# Patient Record
Sex: Male | Born: 1945 | Race: White | Hispanic: No | Marital: Married | Smoking: Never smoker
Health system: Southern US, Community
[De-identification: ages and names within clinical notes are randomized; demographics above are authoritative.]

## PROBLEM LIST (undated history)

## (undated) DIAGNOSIS — R042 Hemoptysis: Secondary | ICD-10-CM

## (undated) DIAGNOSIS — N200 Calculus of kidney: Secondary | ICD-10-CM

## (undated) DIAGNOSIS — M1990 Unspecified osteoarthritis, unspecified site: Secondary | ICD-10-CM

## (undated) DIAGNOSIS — E7800 Pure hypercholesterolemia, unspecified: Secondary | ICD-10-CM

## (undated) DIAGNOSIS — I499 Cardiac arrhythmia, unspecified: Secondary | ICD-10-CM

## (undated) DIAGNOSIS — K219 Gastro-esophageal reflux disease without esophagitis: Secondary | ICD-10-CM

## (undated) DIAGNOSIS — J45909 Unspecified asthma, uncomplicated: Secondary | ICD-10-CM

## (undated) DIAGNOSIS — I10 Essential (primary) hypertension: Secondary | ICD-10-CM

## (undated) DIAGNOSIS — E119 Type 2 diabetes mellitus without complications: Secondary | ICD-10-CM

## (undated) DIAGNOSIS — A419 Sepsis, unspecified organism: Secondary | ICD-10-CM

## (undated) DIAGNOSIS — E0590 Thyrotoxicosis, unspecified without thyrotoxic crisis or storm: Secondary | ICD-10-CM

## (undated) DIAGNOSIS — I4891 Unspecified atrial fibrillation: Secondary | ICD-10-CM

## (undated) DIAGNOSIS — J189 Pneumonia, unspecified organism: Secondary | ICD-10-CM

## (undated) HISTORY — DX: Pure hypercholesterolemia, unspecified: E78.00

## (undated) HISTORY — DX: Type 2 diabetes mellitus without complications: E11.9

## (undated) HISTORY — DX: Hemoptysis: R04.2

## (undated) HISTORY — DX: Essential (primary) hypertension: I10

## (undated) HISTORY — DX: Unspecified osteoarthritis, unspecified site: M19.90

## (undated) HISTORY — DX: Sepsis, unspecified organism: A41.9

## (undated) HISTORY — PX: JOINT REPLACEMENT: SHX530

## (undated) HISTORY — DX: Gastro-esophageal reflux disease without esophagitis: K21.9

## (undated) HISTORY — DX: Pneumonia, unspecified organism: J18.9

## (undated) HISTORY — DX: Calculus of kidney: N20.0

## (undated) HISTORY — DX: Unspecified asthma, uncomplicated: J45.909

## (undated) HISTORY — DX: Thyrotoxicosis, unspecified without thyrotoxic crisis or storm: E05.90

## (undated) HISTORY — DX: Cardiac arrhythmia, unspecified: I49.9

---

## 1998-03-23 ENCOUNTER — Inpatient Hospital Stay (HOSPITAL_COMMUNITY): Admission: EM | Admit: 1998-03-23 | Discharge: 1998-03-25 | Payer: Self-pay | Admitting: Emergency Medicine

## 1998-03-23 ENCOUNTER — Encounter: Payer: Self-pay | Admitting: Neurosurgery

## 1999-07-11 ENCOUNTER — Encounter: Payer: Self-pay | Admitting: Neurosurgery

## 1999-07-11 ENCOUNTER — Encounter: Admission: RE | Admit: 1999-07-11 | Discharge: 1999-07-11 | Payer: Self-pay | Admitting: Neurosurgery

## 2005-04-15 ENCOUNTER — Other Ambulatory Visit: Payer: Self-pay

## 2005-04-23 ENCOUNTER — Inpatient Hospital Stay: Payer: Self-pay | Admitting: Specialist

## 2006-01-04 ENCOUNTER — Emergency Department: Payer: Self-pay | Admitting: Emergency Medicine

## 2006-01-05 ENCOUNTER — Ambulatory Visit: Payer: Self-pay | Admitting: Emergency Medicine

## 2007-10-20 ENCOUNTER — Emergency Department: Payer: Self-pay | Admitting: Emergency Medicine

## 2010-01-24 ENCOUNTER — Ambulatory Visit: Payer: Self-pay | Admitting: Ophthalmology

## 2010-01-30 ENCOUNTER — Ambulatory Visit: Payer: Self-pay | Admitting: Ophthalmology

## 2010-02-09 ENCOUNTER — Other Ambulatory Visit: Payer: Self-pay | Admitting: Internal Medicine

## 2010-02-10 ENCOUNTER — Inpatient Hospital Stay: Payer: Self-pay | Admitting: Internal Medicine

## 2010-03-02 ENCOUNTER — Ambulatory Visit: Payer: Self-pay | Admitting: Internal Medicine

## 2010-04-10 ENCOUNTER — Ambulatory Visit: Payer: Self-pay | Admitting: Internal Medicine

## 2010-04-12 ENCOUNTER — Institutional Professional Consult (permissible substitution): Payer: Self-pay | Admitting: Internal Medicine

## 2011-01-10 ENCOUNTER — Ambulatory Visit: Payer: Self-pay | Admitting: Pain Medicine

## 2011-01-30 ENCOUNTER — Ambulatory Visit: Payer: Self-pay | Admitting: Pain Medicine

## 2011-07-16 ENCOUNTER — Ambulatory Visit: Payer: Self-pay | Admitting: Pain Medicine

## 2011-07-30 ENCOUNTER — Ambulatory Visit: Payer: Self-pay | Admitting: Pain Medicine

## 2011-10-01 ENCOUNTER — Ambulatory Visit: Payer: Self-pay | Admitting: Pain Medicine

## 2011-10-07 ENCOUNTER — Ambulatory Visit: Payer: Self-pay | Admitting: Pain Medicine

## 2011-10-16 ENCOUNTER — Ambulatory Visit: Payer: Self-pay | Admitting: Pain Medicine

## 2011-10-22 ENCOUNTER — Ambulatory Visit: Payer: Self-pay | Admitting: Pain Medicine

## 2011-10-31 ENCOUNTER — Ambulatory Visit: Payer: Self-pay | Admitting: Pain Medicine

## 2011-11-05 ENCOUNTER — Ambulatory Visit: Payer: Self-pay | Admitting: Pain Medicine

## 2011-11-19 ENCOUNTER — Ambulatory Visit: Payer: Self-pay | Admitting: Pain Medicine

## 2011-12-10 ENCOUNTER — Ambulatory Visit: Payer: Self-pay | Admitting: Pain Medicine

## 2011-12-30 ENCOUNTER — Ambulatory Visit: Payer: Self-pay | Admitting: Pain Medicine

## 2012-03-30 ENCOUNTER — Ambulatory Visit: Payer: Self-pay | Admitting: Internal Medicine

## 2012-04-08 ENCOUNTER — Ambulatory Visit: Payer: Self-pay | Admitting: Pain Medicine

## 2012-05-25 ENCOUNTER — Ambulatory Visit: Payer: Self-pay | Admitting: Pain Medicine

## 2012-06-02 ENCOUNTER — Ambulatory Visit: Payer: Self-pay | Admitting: Pain Medicine

## 2012-06-24 ENCOUNTER — Ambulatory Visit: Payer: Self-pay | Admitting: Pain Medicine

## 2012-09-25 DIAGNOSIS — I480 Paroxysmal atrial fibrillation: Secondary | ICD-10-CM | POA: Insufficient documentation

## 2012-09-25 DIAGNOSIS — E1159 Type 2 diabetes mellitus with other circulatory complications: Secondary | ICD-10-CM | POA: Insufficient documentation

## 2012-09-25 DIAGNOSIS — E7800 Pure hypercholesterolemia, unspecified: Secondary | ICD-10-CM | POA: Insufficient documentation

## 2012-09-25 DIAGNOSIS — M51379 Other intervertebral disc degeneration, lumbosacral region without mention of lumbar back pain or lower extremity pain: Secondary | ICD-10-CM | POA: Insufficient documentation

## 2012-09-25 DIAGNOSIS — I10 Essential (primary) hypertension: Secondary | ICD-10-CM | POA: Insufficient documentation

## 2012-09-25 DIAGNOSIS — M5137 Other intervertebral disc degeneration, lumbosacral region: Secondary | ICD-10-CM | POA: Insufficient documentation

## 2012-09-30 DIAGNOSIS — J45909 Unspecified asthma, uncomplicated: Secondary | ICD-10-CM | POA: Insufficient documentation

## 2012-09-30 DIAGNOSIS — E0590 Thyrotoxicosis, unspecified without thyrotoxic crisis or storm: Secondary | ICD-10-CM | POA: Insufficient documentation

## 2012-12-30 ENCOUNTER — Ambulatory Visit: Payer: Self-pay | Admitting: Pain Medicine

## 2012-12-31 DIAGNOSIS — N1832 Chronic kidney disease, stage 3b: Secondary | ICD-10-CM | POA: Insufficient documentation

## 2013-01-06 ENCOUNTER — Ambulatory Visit: Payer: Self-pay | Admitting: Pain Medicine

## 2013-02-25 HISTORY — PX: EYE SURGERY: SHX253

## 2013-02-25 HISTORY — PX: KNEE SURGERY: SHX244

## 2013-06-17 ENCOUNTER — Ambulatory Visit: Payer: Self-pay | Admitting: Pain Medicine

## 2013-07-05 DIAGNOSIS — Z789 Other specified health status: Secondary | ICD-10-CM | POA: Insufficient documentation

## 2013-07-05 DIAGNOSIS — Z1389 Encounter for screening for other disorder: Secondary | ICD-10-CM | POA: Insufficient documentation

## 2013-07-07 ENCOUNTER — Ambulatory Visit: Payer: Self-pay | Admitting: Pain Medicine

## 2013-07-08 ENCOUNTER — Ambulatory Visit: Payer: Self-pay | Admitting: Pain Medicine

## 2013-07-22 ENCOUNTER — Ambulatory Visit: Payer: Self-pay | Admitting: Pain Medicine

## 2013-10-05 DIAGNOSIS — R7989 Other specified abnormal findings of blood chemistry: Secondary | ICD-10-CM | POA: Insufficient documentation

## 2013-10-05 DIAGNOSIS — E349 Endocrine disorder, unspecified: Secondary | ICD-10-CM | POA: Insufficient documentation

## 2014-01-06 ENCOUNTER — Ambulatory Visit: Payer: Self-pay | Admitting: Pain Medicine

## 2014-03-25 ENCOUNTER — Ambulatory Visit: Payer: Self-pay | Admitting: Pain Medicine

## 2014-04-21 DIAGNOSIS — I4891 Unspecified atrial fibrillation: Secondary | ICD-10-CM | POA: Insufficient documentation

## 2014-12-23 ENCOUNTER — Telehealth: Payer: Self-pay

## 2014-12-23 NOTE — Telephone Encounter (Signed)
Pt wants an epidural

## 2014-12-23 NOTE — Telephone Encounter (Signed)
Schedule patient for lumbar epidural per Dr Laban EmperorNaveira.

## 2015-01-03 ENCOUNTER — Ambulatory Visit: Payer: Medicare Other | Attending: Pain Medicine | Admitting: Pain Medicine

## 2015-01-03 ENCOUNTER — Encounter: Payer: Self-pay | Admitting: Pain Medicine

## 2015-01-03 VITALS — BP 105/57 | HR 77 | Temp 97.8°F | Resp 14 | Ht 73.0 in | Wt 220.0 lb

## 2015-01-03 DIAGNOSIS — M542 Cervicalgia: Secondary | ICD-10-CM

## 2015-01-03 DIAGNOSIS — M48061 Spinal stenosis, lumbar region without neurogenic claudication: Secondary | ICD-10-CM

## 2015-01-03 DIAGNOSIS — M25551 Pain in right hip: Secondary | ICD-10-CM

## 2015-01-03 DIAGNOSIS — K219 Gastro-esophageal reflux disease without esophagitis: Secondary | ICD-10-CM | POA: Diagnosis not present

## 2015-01-03 DIAGNOSIS — M549 Dorsalgia, unspecified: Secondary | ICD-10-CM | POA: Diagnosis present

## 2015-01-03 DIAGNOSIS — M47816 Spondylosis without myelopathy or radiculopathy, lumbar region: Secondary | ICD-10-CM | POA: Diagnosis not present

## 2015-01-03 DIAGNOSIS — M7918 Myalgia, other site: Secondary | ICD-10-CM | POA: Insufficient documentation

## 2015-01-03 DIAGNOSIS — M5412 Radiculopathy, cervical region: Secondary | ICD-10-CM

## 2015-01-03 DIAGNOSIS — I2510 Atherosclerotic heart disease of native coronary artery without angina pectoris: Secondary | ICD-10-CM | POA: Insufficient documentation

## 2015-01-03 DIAGNOSIS — M25511 Pain in right shoulder: Secondary | ICD-10-CM

## 2015-01-03 DIAGNOSIS — M5126 Other intervertebral disc displacement, lumbar region: Secondary | ICD-10-CM

## 2015-01-03 DIAGNOSIS — E7800 Pure hypercholesterolemia, unspecified: Secondary | ICD-10-CM | POA: Diagnosis not present

## 2015-01-03 DIAGNOSIS — E1121 Type 2 diabetes mellitus with diabetic nephropathy: Secondary | ICD-10-CM | POA: Diagnosis not present

## 2015-01-03 DIAGNOSIS — M5136 Other intervertebral disc degeneration, lumbar region: Secondary | ICD-10-CM

## 2015-01-03 DIAGNOSIS — G8929 Other chronic pain: Secondary | ICD-10-CM | POA: Insufficient documentation

## 2015-01-03 DIAGNOSIS — S22000S Wedge compression fracture of unspecified thoracic vertebra, sequela: Secondary | ICD-10-CM

## 2015-01-03 DIAGNOSIS — M4806 Spinal stenosis, lumbar region: Secondary | ICD-10-CM | POA: Diagnosis not present

## 2015-01-03 DIAGNOSIS — F1190 Opioid use, unspecified, uncomplicated: Secondary | ICD-10-CM | POA: Insufficient documentation

## 2015-01-03 DIAGNOSIS — R011 Cardiac murmur, unspecified: Secondary | ICD-10-CM | POA: Insufficient documentation

## 2015-01-03 DIAGNOSIS — Z7901 Long term (current) use of anticoagulants: Secondary | ICD-10-CM | POA: Insufficient documentation

## 2015-01-03 DIAGNOSIS — M79604 Pain in right leg: Secondary | ICD-10-CM

## 2015-01-03 DIAGNOSIS — M5416 Radiculopathy, lumbar region: Secondary | ICD-10-CM

## 2015-01-03 DIAGNOSIS — I129 Hypertensive chronic kidney disease with stage 1 through stage 4 chronic kidney disease, or unspecified chronic kidney disease: Secondary | ICD-10-CM | POA: Insufficient documentation

## 2015-01-03 DIAGNOSIS — E781 Pure hyperglyceridemia: Secondary | ICD-10-CM | POA: Insufficient documentation

## 2015-01-03 DIAGNOSIS — M4726 Other spondylosis with radiculopathy, lumbar region: Secondary | ICD-10-CM | POA: Diagnosis not present

## 2015-01-03 DIAGNOSIS — M4854XS Collapsed vertebra, not elsewhere classified, thoracic region, sequela of fracture: Secondary | ICD-10-CM | POA: Insufficient documentation

## 2015-01-03 DIAGNOSIS — I4891 Unspecified atrial fibrillation: Secondary | ICD-10-CM | POA: Insufficient documentation

## 2015-01-03 DIAGNOSIS — M25562 Pain in left knee: Secondary | ICD-10-CM

## 2015-01-03 DIAGNOSIS — M47812 Spondylosis without myelopathy or radiculopathy, cervical region: Secondary | ICD-10-CM

## 2015-01-03 DIAGNOSIS — I209 Angina pectoris, unspecified: Secondary | ICD-10-CM | POA: Insufficient documentation

## 2015-01-03 DIAGNOSIS — Z79891 Long term (current) use of opiate analgesic: Secondary | ICD-10-CM | POA: Insufficient documentation

## 2015-01-03 MED ORDER — LIDOCAINE HCL (PF) 1 % IJ SOLN
INTRAMUSCULAR | Status: AC
  Administered 2015-01-03: 3 mL
  Filled 2015-01-03: qty 5

## 2015-01-03 MED ORDER — IOHEXOL 180 MG/ML  SOLN: 10.0000 mL | Freq: Once | INTRAMUSCULAR | Status: DC | PRN

## 2015-01-03 MED ORDER — TRIAMCINOLONE ACETONIDE 40 MG/ML IJ SUSP: 40.0000 mg | Freq: Once | INTRAMUSCULAR | Status: DC

## 2015-01-03 MED ORDER — IOHEXOL 180 MG/ML  SOLN
INTRAMUSCULAR | Status: AC
  Filled 2015-01-03: qty 20

## 2015-01-03 MED ORDER — TRIAMCINOLONE ACETONIDE 40 MG/ML IJ SUSP
INTRAMUSCULAR | Status: AC
  Administered 2015-01-03: 40 mg
  Filled 2015-01-03: qty 1

## 2015-01-03 MED ORDER — SODIUM CHLORIDE 0.9 % IJ SOLN: 2.0000 mL | Freq: Once | INTRAMUSCULAR | Status: DC

## 2015-01-03 MED ORDER — SODIUM CHLORIDE 0.9 % IJ SOLN
INTRAMUSCULAR | Status: AC
  Administered 2015-01-03: 2 mL
  Filled 2015-01-03: qty 10

## 2015-01-03 MED ORDER — ROPIVACAINE HCL 2 MG/ML IJ SOLN: 2.0000 mL | Freq: Once | INTRAMUSCULAR | Status: DC

## 2015-01-03 MED ORDER — ROPIVACAINE HCL 2 MG/ML IJ SOLN
INTRAMUSCULAR | Status: AC
  Administered 2015-01-03: 2 mL
  Filled 2015-01-03: qty 10

## 2015-01-03 MED ORDER — LIDOCAINE HCL (PF) 1 % IJ SOLN: 10.0000 mL | Freq: Once | INTRAMUSCULAR | Status: DC

## 2015-01-03 NOTE — Progress Notes (Signed)
Patient's Name: Alan Stafford MRN: [id number] DOB: 1945-07-10 DOS: 01/03/2015  Primary Reason(s) for Visit: Interventional Pain Management Treatment. CC: Back Pain   Pre-Procedure Assessment: Alan Stafford is a 69 y.o. year old, male patient, seen today for interventional treatment. He has Chronic pain; Lumbar spondylosis; Airway hyperreactivity; Atrial fibrillation ([place]); Chronic kidney disease (CKD), stage III (moderate); CAD in native artery; Degeneration of intervertebral disc of lumbar region; Diabetic nephropathy ([place]); Essential (primary) hypertension; Acid reflux; Cardiac murmur; Hypercholesterolemia; Hypertriglyceridemia; Hyperthyroidism; Hypotestosteronism; Arthritis of knee, degenerative; Angina pectoris ([place]); Paroxysmal atrial fibrillation ([place]); Encounter for screening for other disorder; Drug intolerance; Type 2 diabetes mellitus ([place]); Long term current use of anticoagulant therapy; Bulging lumbar disc (T12-L1, L1-2, L2-3, and L4-5); 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 His primarily concern today is the Back Pain Verification of the correct person, correct site (including marking of site), and correct procedure were performed and confirmed by the patient. Today's Vitals   01/03/15 0949 01/03/15 0954 01/03/15 1004 01/03/15 1014  BP: 166/94 158/63 99/59 105/57  Pulse: 106 76 77 77  Temp:      TempSrc:      Resp: 14 14 14 14   Height:      Weight:      SpO2: 94% 93% 98%  98%  PainSc:  0-No pain    Calculated BMI: Body mass index is 29.03 kg/(m^2). Allergies: He has No Known Allergies.. 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 Laterality: Right-Sided Paramedial  Indications: Spondylosis, Lumbosacral Region  Consent: Secured. Under the influence of no sedatives a written informed consent was obtained, after having provided information on the risks and possible complications. To fulfill our ethical and legal obligations, as recommended by the American Medical Association's Code of Ethics, we have provided information to the patient about our clinical impression; the nature and purpose of the treatment or procedure; the risks, benefits, and possible complications of the intervention; alternatives; the risk(s) and benefit(s) of the alternative treatment(s) or procedure(s); and the risk(s) and benefit(s) of doing nothing. The patient was provided information about the risks and possible complications associated with the procedure. In the case of spinal procedures these may include, but are not limited to, failure to achieve desired goals, infection, bleeding, organ or nerve damage, allergic reactions, paralysis, and death. In addition, the patient was informed that Medicine is not an exact science; therefore, there is also the possibility of unforeseen risks and possible complications that may result in a catastrophic outcome. The patient indicated having understood very clearly. We have given the patient no guarantees and we have made no promises. Enough time was given to the patient to ask questions, all of which were answered to the patient's satisfaction.  Pre-Procedure Preparation: Safety Precautions: Allergies reviewed. Appropriate site, procedure, and patient were confirmed by following the Joint Commission's  Universal Protocol (UP.01.01.01), in the form of a "Time Out". The patient was asked to confirm marked site and procedure, before commencing. The patient was asked about blood  thinners, or active infections, both of which were denied. Patient was assessed for positional comfort and all pressure points were checked before starting procedure. Monitoring:  As per clinic protocol. Infection Control Precautions: Sterile technique used. Standard Universal Precautions were taken as recommended by the Department of [facility] for Disease Control and Prevention (CDC). Standard pre-surgical skin prep was conducted. Respiratory hygiene and cough etiquette was practiced. Hand hygiene observed. Safe injection practices and needle disposal techniques followed. SDV (single dose vial) medications used. Medications properly checked for expiration dates and contaminants. Personal protective equipment (PPE) used: Surgical mask. Sterile double glove technique. Radiation resistant gloves. Sterile surgical gloves.  Anesthesia, Analgesia, Anxiolysis: Type: Local Anesthesia Local Anesthetic(s): Lidocaine 1% Route: Subcutaneous IV Access: Declined. Sedation: Declined. Indication(s):Not applicable.  Description of Procedure Process:  Time-out: "Time-out" completed before starting procedure, as per protocol. Position: Prone Target Area: For Epidural Steroid injections, the target area is the  interlaminar space, initially targeting the lower border of the superior vertebral body lamina. Approach: Posterior approach. Area Prepped: Entire Posterior Lumbosacral Region Prepping solution: ChloraPrep (2% chlorhexidine gluconate and 70% isopropyl alcohol) Safety Precautions: Aspiration looking for blood return was conducted prior to all injections. At no point did we inject any substances, as a needle was being advanced. No attempts were made at seeking any paresthesias. Safe injection practices and needle disposal  techniques used. Medications properly checked for expiration dates. SDV (single dose vial) medications used. Description of the Procedure: Protocol guidelines were followed. The patient was placed in position over the fluoroscopy table. The target area was identified and the area prepped in the usual manner. Skin desensitized using vapocoolant spray. Skin & deeper tissues infiltrated with local anesthetic. Appropriate amount of time allowed to pass for local anesthetics to take effect. The procedure needle was introduced through the skin, ipsilateral to the reported pain, and advanced to the target area. Bone was contacted and the needle walked caudad, until the lamina was cleared. The epidural space was identified using "loss-of-resistance technique" with 2-3 ml of PF-NaCl (0.9% NSS), in a 5cc LOR glass syringe. Proper needle placement secured. Negative aspiration confirmed. Solution injected in intermittent fashion, asking for systemic symptoms every 0.5cc of injectate. The needles were then removed and the area cleansed, making sure to leave some of the prepping solution back to take advantage of its long term bactericidal properties. EBL: None Materials & Medications Used:  Needle(s) Used: 20g - 10cm, Tuohy-style epidural needle Medications Administered today: We administered lidocaine (PF), sodium chloride, ropivacaine (PF) 2 mg/ml (0.2%), and triamcinolone acetonide.Please see chart orders for dosing details.  Imaging Guidance:  Type of Imaging Technique: Fluoroscopy Guidance (Spinal) Indication(s): Assistance in needle guidance and placement for procedures requiring needle placement in or near specific anatomical locations not easily accessible without such assistance. Exposure Time: Please see nurses notes. Contrast: Before injecting any contrast, we confirmed that the patient did not have an allergy to iodine, shellfish, or radiological contrast. Once satisfactory needle placement was completed  at the desired level, radiological contrast was injected. Injection was conducted under continuous fluoroscopic guidance. Injection of contrast accomplished without complications. See chart for type and volume of contrast used. Fluoroscopic Guidance: I was personally present in the fluoroscopy suite, where the patient was placed in position for the procedure, over the fluoroscopy-compatible table. Fluoroscopy was manipulated, using "Tunnel Vision Technique", to obtain the best possible view of the target area, on the affected side. Parallax error was corrected before commencing the procedure. A "direction-depth-direction" technique was  used to introduce the needle under continuous pulsed fluoroscopic guidance. Once the target was reached, antero-posterior, oblique, and lateral fluoroscopic projection views were taken to confirm needle placement in all planes. Permanently recorded images stored by scanning into EMR. Interpretation: Intraoperative imaging interpretation by performing Physician. Adequate needle placement confirmed. Adequate needle placement confirmed in AP, lateral, & Oblique Views. Appropriate spread of contrast to desired area. No evidence of afferent or efferent intravascular uptake. No intrathecal or subarachnoid spread observed. Permanent hardcopy images in multiple planes scanned into the patient's record.  Antibiotics:  Type:  Antibiotics Given (last 72 hours)    None      Indication(s): No indications identified.  Post-operative Assessment:  Complications: No immediate post-treatment complications were observed. Relevant Post-operative Information:  Disposition: Return to clinic for follow-up evaluation. The patient tolerated the entire procedure well. A repeat set of vitals were taken after the procedure and the patient was kept under observation following institutional policy, for this procedure. Post-procedural neurological assessment was performed, showing return to baseline,  prior to discharge. The patient was discharged home, once institutional criteria were met. The patient was provided with post-procedure discharge instructions, including a section on how to identify potential problems. Should any problems arise concerning this procedure, the patient was given instructions to immediately contact us, at any time, without hesitation. In any case, we plan to contact the patient by telephone for a follow-up status report regarding this interventional procedure. Comments:  No additional relevant information.  Primary Care Physician: No primary care provider on file. Location: [facility] Note by: Jailah Willis A. Dossie Arbour, M.D, DABA, DABAPM, DABPM, DABIPP, FIPP  Disclaimer:  Medicine is not an exact science. The only guarantee in medicine is that nothing is guaranteed. It is important to note that the decision to proceed with this intervention was based on the information collected from the patient. The Data and conclusions were drawn from the patient's questionnaire, the interview, and the physical examination. Because the information was provided in large part by the patient, it cannot be guaranteed that it has not been purposely or unconsciously manipulated. Every effort has been made to obtain as much relevant data as possible for this evaluation. It is important to note that the conclusions that lead to this procedure are derived in large part from the available data. Always take into account that the treatment will also be dependent on availability of resources and existing treatment guidelines, considered by other Pain Management Practitioners as being common knowledge and practice, at the time of the intervention. For Medico-Legal purposes, it is also important to point out that variation in procedural techniques and pharmacological choices are the acceptable norm. The indications, contraindications, technique, and results of the above procedure  should only be interpreted and judged by a Board-Certified Interventional Pain Specialist with extensive familiarity and expertise in the same exact procedure and technique. Attempts at providing opinions without similar or greater experience and expertise than that of the treating physician will be considered as inappropriate and unethical, and shall result in a formal complaint to the state medical board and applicable specialty societies.

## 2015-01-03 NOTE — Patient Instructions (Signed)
Pain Management Discharge Instructions  General Discharge Instructions :  If you need to reach your doctor call: Monday-Friday 8:00 am - 4:00 pm at [phone] or toll free [phone].  After clinic hours [phone] to have operator reach doctor.  Bring all of your medication bottles to all your appointments in the pain clinic.  To cancel or reschedule your appointment with Pain Management please remember to call 24 hours in advance to avoid a fee.  Refer to the educational materials which you have been given on: General Risks, I had my Procedure. Discharge Instructions, Post Sedation.  Post Procedure Instructions:  Please notify your doctor immediately if you have any unusual bleeding, trouble breathing or pain that is not related to your normal pain.  Depending on the type of procedure that was done, some parts of your body may feel week and/or numb.  This usually clears up by tonight or the next day.  Walk with the use of an assistive device or accompanied by an adult for the 24 hours.  You may use ice on the affected area for the first 24 hours.  Put ice in a Ziploc bag and cover with a towel and place against area 15 minutes on 15 minutes off.  You may switch to heat after 24 hours.  IMPORTANT: Please fill out the post procedure pain diary and bring it with you at your next appointment.

## 2015-01-04 ENCOUNTER — Telehealth: Payer: Self-pay | Admitting: *Deleted

## 2015-01-17 ENCOUNTER — Encounter: Payer: Self-pay | Admitting: Pain Medicine

## 2015-01-17 ENCOUNTER — Ambulatory Visit: Payer: Medicare Other | Attending: Pain Medicine | Admitting: Pain Medicine

## 2015-01-17 VITALS — BP 108/71 | HR 102 | Temp 97.8°F | Resp 16 | Ht 72.0 in | Wt 228.0 lb

## 2015-01-17 DIAGNOSIS — G8929 Other chronic pain: Secondary | ICD-10-CM

## 2015-01-17 DIAGNOSIS — M47812 Spondylosis without myelopathy or radiculopathy, cervical region: Secondary | ICD-10-CM

## 2015-01-17 DIAGNOSIS — M5136 Other intervertebral disc degeneration, lumbar region: Secondary | ICD-10-CM | POA: Diagnosis not present

## 2015-01-17 DIAGNOSIS — M549 Dorsalgia, unspecified: Secondary | ICD-10-CM | POA: Diagnosis present

## 2015-01-17 DIAGNOSIS — I2510 Atherosclerotic heart disease of native coronary artery without angina pectoris: Secondary | ICD-10-CM | POA: Insufficient documentation

## 2015-01-17 DIAGNOSIS — F1190 Opioid use, unspecified, uncomplicated: Secondary | ICD-10-CM

## 2015-01-17 DIAGNOSIS — M47816 Spondylosis without myelopathy or radiculopathy, lumbar region: Secondary | ICD-10-CM | POA: Insufficient documentation

## 2015-01-17 DIAGNOSIS — M5412 Radiculopathy, cervical region: Secondary | ICD-10-CM

## 2015-01-17 DIAGNOSIS — Z5181 Encounter for therapeutic drug level monitoring: Secondary | ICD-10-CM | POA: Insufficient documentation

## 2015-01-17 DIAGNOSIS — E7800 Pure hypercholesterolemia, unspecified: Secondary | ICD-10-CM | POA: Diagnosis not present

## 2015-01-17 DIAGNOSIS — M79604 Pain in right leg: Secondary | ICD-10-CM | POA: Diagnosis not present

## 2015-01-17 DIAGNOSIS — E1140 Type 2 diabetes mellitus with diabetic neuropathy, unspecified: Secondary | ICD-10-CM | POA: Diagnosis not present

## 2015-01-17 DIAGNOSIS — M4806 Spinal stenosis, lumbar region: Secondary | ICD-10-CM | POA: Diagnosis not present

## 2015-01-17 DIAGNOSIS — M5116 Intervertebral disc disorders with radiculopathy, lumbar region: Secondary | ICD-10-CM | POA: Insufficient documentation

## 2015-01-17 DIAGNOSIS — I4891 Unspecified atrial fibrillation: Secondary | ICD-10-CM | POA: Diagnosis not present

## 2015-01-17 DIAGNOSIS — M48061 Spinal stenosis, lumbar region without neurogenic claudication: Secondary | ICD-10-CM

## 2015-01-17 DIAGNOSIS — F1120 Opioid dependence, uncomplicated: Secondary | ICD-10-CM | POA: Insufficient documentation

## 2015-01-17 DIAGNOSIS — M4726 Other spondylosis with radiculopathy, lumbar region: Secondary | ICD-10-CM | POA: Diagnosis not present

## 2015-01-17 DIAGNOSIS — I129 Hypertensive chronic kidney disease with stage 1 through stage 4 chronic kidney disease, or unspecified chronic kidney disease: Secondary | ICD-10-CM | POA: Insufficient documentation

## 2015-01-17 DIAGNOSIS — N183 Chronic kidney disease, stage 3 (moderate): Secondary | ICD-10-CM | POA: Diagnosis not present

## 2015-01-17 DIAGNOSIS — M4854XA Collapsed vertebra, not elsewhere classified, thoracic region, initial encounter for fracture: Secondary | ICD-10-CM | POA: Diagnosis not present

## 2015-01-17 DIAGNOSIS — M542 Cervicalgia: Secondary | ICD-10-CM | POA: Diagnosis present

## 2015-01-17 NOTE — Progress Notes (Signed)
Patient is here for post-procedural evaluation.

## 2015-01-17 NOTE — Progress Notes (Signed)
Patient's Name: Alan Stafford MRN: [id number] DOB: Dec 15, 1945 DOS: 01/17/2015  Primary Reason(s) for Visit: Post-Procedure evaluation CC: Back Pain and Neck Pain   HPI:   [name] is a 69 y.o. year old, male patient, who returns today as an established patient. He has Chronic pain; Lumbar spondylosis; Airway hyperreactivity; Atrial fibrillation (HCC); Chronic kidney disease (CKD), stage III (moderate); CAD in native artery; Degeneration of intervertebral disc of lumbar region; Diabetic nephropathy (HCC); Essential (primary) hypertension; Acid reflux; Cardiac murmur; Hypercholesterolemia; Hypertriglyceridemia; Hyperthyroidism; Hypotestosteronism; Arthritis of knee, degenerative; Angina pectoris (HCC); Paroxysmal atrial fibrillation (HCC); Encounter for screening for other disorder; Drug intolerance; Type 2 diabetes mellitus (HCC); Long term current use of anticoagulant therapy (Coumadin); Bulging lumbar disc (T12-L1, L1-2, L2-3, and L4-5); Lumbar foraminal stenosis (severe right L4-5); Chronic pain of right lower extremity; Chronic right hip pain; Chronic radicular lumbar pain (right side); Myofascial pain syndrome (right suprascapular muscle); Chronic neck pain; Chronic right shoulder pain; Cervical spondylosis; Chronic cervical radicular pain (right-sided); Chronic pain of left knee; Long term current use of opiate analgesic; Long term prescription opiate use; Opiate use; Thoracic compression fracture (HCC) (T3, T4, and T8); Cervical facet hypertrophy (bilateral C3-4); Encounter for therapeutic drug level monitoring; Encounter for chronic pain management; and Opioid dependence (HCC) on his problem list.. His primarily concern today is the Back Pain and Neck Pain     The patient returns to the clinic today after having had a right-sided lumbar epidural steroid injection done under fluoroscopic guidance, without sedation. He indicates currently having no pain. He is very happy with that and he'll like to  keep the option of coming in PRN for procedures. He says that his cervical spine is beginning to give him some problems and he may be calling later on in for a shot in the neck. In the past we have done cervical epidural steroid injections.  Today's Pain Score: 0-No pain Reported level of pain is compatible with clinical observation Pain Type: Chronic pain Pain Location: Back (lower, bilateral and R neck. ) Pain Orientation: Lower (Right neck) Pain Descriptors / Indicators: Constant, Spasm Pain Frequency: Constant  Date of Last Visit: 01/03/15 Service Provided on Last Visit: Procedure  Pharmacotherapy Review: The patient's medication management is currently not under our care. Side-effects or Adverse reactions: None reported. Effectiveness: Described as relatively effective, allowing for increase in activities of daily living (ADL). Onset of action: Within expected pharmacological parameters. Duration of action: Within normal limits for medication. Peak effect: Timing and results are as within normal expected parameters. [place] PMP: Compliant with practice rules and regulations. Medication Assessment Form: Reviewed. Patient indicates being compliant with therapy Treatment compliance: Compliant. Substance Use Disorder (SUD) Risk Level: Low  Procedure Assessment:  Procedure done on last visit: Right-sided L4-5 lumbar epidural steroid injection under fluoroscopic guidance. Side-effects or Adverse reactions: None reported. Sedation: No sedation used during procedure.  Results: Ultra-Short Term Relief (First 1 hour after procedure): 100 % Short Term Relief (Initial 4-6 hrs after procedure): 100 % Long Term Relief : 98 %  Current Relief (Now):  100% Interpretation of Results: Ultra-short term relief is a normal physiological response to analgesics and anesthetics provided during the procedure. Short-term relief confirms injected site as etiology of pain. Long term relief is possibly due to  sympathetic blockade, or the effects of steroids, if administered during procedure.  We will repeat PRN, as necessary.  Allergies: Mr. Benzel has No Known Allergies.  Meds: The patient has a current medication list which includes  the following prescription(s): aspirin, citalopram, enalapril, flecainide, gabapentin, hydrocodone-acetaminophen, magnesium oxide, metformin, metoprolol tartrate, omeprazole, propylthiouracil, and warfarin, and the following Facility-Administered Medications: iohexol, lidocaine (pf), ropivacaine (pf) 2 mg/ml (0.2%), sodium chloride, and triamcinolone acetonide. Requested Prescriptions    No prescriptions requested or ordered in this encounter    ROS: Constitutional: Afebrile, no chills, well hydrated and well nourished Gastrointestinal: negative Musculoskeletal:negative Neurological: negative Behavioral/Psych: negative  PFSH: Medical:  [name]  has a past medical history of Hyperthyroidism; Hypertension; GERD (gastroesophageal reflux disease); Arthritis; Kidney stones; Abnormal heart rhythm; Asthma; Hypercholesteremia; and Diabetes mellitus without complication (HCC). Family: family history includes Dementia in his mother; Heart disease in his father. Surgical:  has past surgical history that includes Knee surgery (Right). Tobacco:  reports that he has never smoked. He does not have any smokeless tobacco history on file. Alcohol:  reports that he does not drink alcohol. Drug:  reports that he does not use illicit drugs.  Physical Exam: Vitals:  Today's Vitals   01/17/15 1343 01/17/15 1347  BP: 108/71   Pulse: 102   Temp: 97.8 F (36.6 C)   TempSrc: Oral   Resp: 16   Height: 6' (1.829 m)   Weight: 228 lb (103.42 kg)   SpO2: 97%   PainSc: 0-No pain 0-No pain  Calculated BMI: Body mass index is 30.92 kg/(m^2). General appearance: alert, cooperative, appears stated age and no distress Eyes: conjunctivae/corneas clear. PERRL, EOM's intact. Fundi  benign. Lungs: No evidence respiratory distress, no audible rales or ronchi and no use of accessory muscles of respiration Neck: no adenopathy, no carotid bruit, no JVD, supple, symmetrical, trachea midline and thyroid not enlarged, symmetric, no tenderness/mass/nodules Back: symmetric, no curvature. ROM normal. No CVA tenderness. Extremities: extremities normal, atraumatic, no cyanosis or edema Pulses: 2+ and symmetric Skin: Skin color, texture, turgor normal. No rashes or lesions Neurologic: Grossly normal    Assessment: Encounter Diagnosis:  Primary Diagnosis: Lumbar foraminal stenosis [M48.06]  Plan: Brett CanalesSteve was seen today for back pain and neck pain.  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 Medications discontinued today:  Medications Discontinued During This Encounter  Medication Reason  . Cholecalciferol (VITAMIN D3) 5000 UNITS TABS Error  . enalapril (VASOTEC) 20 MG tablet Error   Medications administered today:  [name] had no medications administered during this visit.  Primary Care Physician: No primary care provider on file. Location: [facility] Note by: Satish Hammers A. Laban EmperorNaveira, M.D, DABA, DABAPM, DABPM, DABIPP, FIPP

## 2015-08-14 ENCOUNTER — Telehealth: Payer: Self-pay | Admitting: *Deleted

## 2015-08-14 NOTE — Telephone Encounter (Signed)
OK to schedule a procedure- has standing order-No answer- left message to call and schedule

## 2015-08-14 NOTE — Telephone Encounter (Signed)
Pt is having pain in his lower back on both sides and is requesting an injection. Please give the pt a call.[email or web address]

## 2015-08-17 ENCOUNTER — Encounter: Payer: Self-pay | Admitting: Pain Medicine

## 2015-08-17 ENCOUNTER — Ambulatory Visit: Payer: Medicare Other | Attending: Pain Medicine | Admitting: Pain Medicine

## 2015-08-17 ENCOUNTER — Encounter (INDEPENDENT_AMBULATORY_CARE_PROVIDER_SITE_OTHER): Payer: Self-pay

## 2015-08-17 VITALS — BP 115/92 | HR 103 | Temp 98.1°F | Resp 16 | Ht 73.0 in | Wt 224.0 lb

## 2015-08-17 DIAGNOSIS — I480 Paroxysmal atrial fibrillation: Secondary | ICD-10-CM | POA: Diagnosis not present

## 2015-08-17 DIAGNOSIS — M5416 Radiculopathy, lumbar region: Secondary | ICD-10-CM | POA: Diagnosis not present

## 2015-08-17 DIAGNOSIS — K219 Gastro-esophageal reflux disease without esophagitis: Secondary | ICD-10-CM | POA: Diagnosis not present

## 2015-08-17 DIAGNOSIS — E7800 Pure hypercholesterolemia, unspecified: Secondary | ICD-10-CM | POA: Insufficient documentation

## 2015-08-17 DIAGNOSIS — M25511 Pain in right shoulder: Secondary | ICD-10-CM | POA: Diagnosis not present

## 2015-08-17 DIAGNOSIS — E781 Pure hyperglyceridemia: Secondary | ICD-10-CM | POA: Insufficient documentation

## 2015-08-17 DIAGNOSIS — Z79891 Long term (current) use of opiate analgesic: Secondary | ICD-10-CM | POA: Diagnosis not present

## 2015-08-17 DIAGNOSIS — M4806 Spinal stenosis, lumbar region: Secondary | ICD-10-CM | POA: Diagnosis not present

## 2015-08-17 DIAGNOSIS — J45909 Unspecified asthma, uncomplicated: Secondary | ICD-10-CM | POA: Diagnosis not present

## 2015-08-17 DIAGNOSIS — M25551 Pain in right hip: Secondary | ICD-10-CM | POA: Insufficient documentation

## 2015-08-17 DIAGNOSIS — I129 Hypertensive chronic kidney disease with stage 1 through stage 4 chronic kidney disease, or unspecified chronic kidney disease: Secondary | ICD-10-CM | POA: Diagnosis not present

## 2015-08-17 DIAGNOSIS — E1140 Type 2 diabetes mellitus with diabetic neuropathy, unspecified: Secondary | ICD-10-CM | POA: Diagnosis not present

## 2015-08-17 DIAGNOSIS — R011 Cardiac murmur, unspecified: Secondary | ICD-10-CM | POA: Insufficient documentation

## 2015-08-17 DIAGNOSIS — Z7901 Long term (current) use of anticoagulants: Secondary | ICD-10-CM | POA: Insufficient documentation

## 2015-08-17 DIAGNOSIS — M4802 Spinal stenosis, cervical region: Secondary | ICD-10-CM | POA: Insufficient documentation

## 2015-08-17 DIAGNOSIS — M47896 Other spondylosis, lumbar region: Secondary | ICD-10-CM | POA: Diagnosis not present

## 2015-08-17 DIAGNOSIS — M1612 Unilateral primary osteoarthritis, left hip: Secondary | ICD-10-CM | POA: Insufficient documentation

## 2015-08-17 DIAGNOSIS — M48061 Spinal stenosis, lumbar region without neurogenic claudication: Secondary | ICD-10-CM

## 2015-08-17 DIAGNOSIS — M79604 Pain in right leg: Secondary | ICD-10-CM | POA: Diagnosis not present

## 2015-08-17 DIAGNOSIS — I2510 Atherosclerotic heart disease of native coronary artery without angina pectoris: Secondary | ICD-10-CM | POA: Insufficient documentation

## 2015-08-17 DIAGNOSIS — E1122 Type 2 diabetes mellitus with diabetic chronic kidney disease: Secondary | ICD-10-CM | POA: Diagnosis not present

## 2015-08-17 DIAGNOSIS — M5116 Intervertebral disc disorders with radiculopathy, lumbar region: Secondary | ICD-10-CM | POA: Insufficient documentation

## 2015-08-17 DIAGNOSIS — M5124 Other intervertebral disc displacement, thoracic region: Secondary | ICD-10-CM | POA: Insufficient documentation

## 2015-08-17 DIAGNOSIS — N183 Chronic kidney disease, stage 3 (moderate): Secondary | ICD-10-CM | POA: Insufficient documentation

## 2015-08-17 DIAGNOSIS — E0590 Thyrotoxicosis, unspecified without thyrotoxic crisis or storm: Secondary | ICD-10-CM | POA: Insufficient documentation

## 2015-08-17 DIAGNOSIS — M4854XA Collapsed vertebra, not elsewhere classified, thoracic region, initial encounter for fracture: Secondary | ICD-10-CM | POA: Insufficient documentation

## 2015-08-17 DIAGNOSIS — M1712 Unilateral primary osteoarthritis, left knee: Secondary | ICD-10-CM | POA: Insufficient documentation

## 2015-08-17 DIAGNOSIS — M25562 Pain in left knee: Secondary | ICD-10-CM

## 2015-08-17 DIAGNOSIS — G8929 Other chronic pain: Secondary | ICD-10-CM | POA: Diagnosis not present

## 2015-08-17 DIAGNOSIS — M47812 Spondylosis without myelopathy or radiculopathy, cervical region: Secondary | ICD-10-CM | POA: Insufficient documentation

## 2015-08-17 DIAGNOSIS — M5126 Other intervertebral disc displacement, lumbar region: Secondary | ICD-10-CM | POA: Insufficient documentation

## 2015-08-17 DIAGNOSIS — M545 Low back pain: Secondary | ICD-10-CM

## 2015-08-17 DIAGNOSIS — M549 Dorsalgia, unspecified: Secondary | ICD-10-CM | POA: Diagnosis present

## 2015-08-17 MED ORDER — ROPIVACAINE HCL 2 MG/ML IJ SOLN
2.0000 mL | Freq: Once | INTRAMUSCULAR | Status: AC
  Administered 2015-08-17: 2 mL via EPIDURAL
  Filled 2015-08-17: qty 10

## 2015-08-17 MED ORDER — IOPAMIDOL (ISOVUE-M 200) INJECTION 41%
10.0000 mL | Freq: Once | INTRAMUSCULAR | Status: AC
  Administered 2015-08-17: 10 mL via EPIDURAL
  Filled 2015-08-17: qty 10

## 2015-08-17 MED ORDER — LIDOCAINE HCL (PF) 1 % IJ SOLN
10.0000 mL | Freq: Once | INTRAMUSCULAR | Status: AC
  Administered 2015-08-17: 10 mL
  Filled 2015-08-17: qty 10

## 2015-08-17 MED ORDER — TRIAMCINOLONE ACETONIDE 40 MG/ML IJ SUSP
40.0000 mg | Freq: Once | INTRAMUSCULAR | Status: AC
  Administered 2015-08-17: 40 mg
  Filled 2015-08-17: qty 1

## 2015-08-17 MED ORDER — SODIUM CHLORIDE 0.9% FLUSH
2.0000 mL | Freq: Once | INTRAVENOUS | Status: AC
  Administered 2015-08-17: 2 mL

## 2015-08-17 NOTE — Progress Notes (Signed)
Safety precautions to be maintained throughout the outpatient stay will include: orient to surroundings, keep bed in low position, maintain call bell within reach at all times, provide assistance with transfer out of bed and ambulation.  

## 2015-08-17 NOTE — Progress Notes (Signed)
Patient's Name: Alan Stafford  Patient type: Established  MRN: [id number]  Service setting: Ambulatory outpatient  DOB: 1945-11-20  Location: [facility]  DOS: 08/17/2015  Primary Care Physician: No primary care provider on file.  Note by:  A. Dossie Arbour, M.D, DABA, DABAPM, DABPM, Milagros Evener, FIPP  Referring Physician: Milinda Pointer, MD  Specialty: Board-Certified Interventional Pain Management  Last Visit to Pain Management: 08/14/2015   Primary Reason(s) for Visit: Interventional Pain Management Treatment. CC: Back Pain  Primary Diagnosis: Chronic radicular lumbar pain [M54.16, G89.29]   Procedure:  Anesthesia, Analgesia, Anxiolysis:  Type: Therapeutic Inter-Laminar Epidural Steroid Injection Region: Lumbar Level: L4-5 Level. Laterality: Right Paramedial  Indications: 1. Chronic lumbar radicular pain (Right)   2. 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 Chronic pain of right lower extremity   4. Chronic low back pain (Location of Secondary source of pain) (Bilateral) (R>L)   5. Chronic knee pain (Location of Tertiary source of pain) (Left)     Pre-procedure Pain Score: 7/10 Reported level of pain is compatible with clinical observations Post-procedure Pain Score: 7   Type: Local Anesthesia Local Anesthetic: Lidocaine 1% Route: Infiltration ([place]/IM) IV Access: Declined Sedation: Declined  Indication(s): Analgesia          Pre-Procedure Assessment:  Alan Stafford is a 70 y.o. year old, male patient, seen today for interventional treatment. He has Chronic pain; Lumbar spondylosis; Airway hyperreactivity; Atrial fibrillation ([place]); Chronic kidney disease (CKD), stage III (moderate); CAD in native artery; Diabetic nephropathy ([place]); Essential (primary) hypertension; Acid reflux; Cardiac murmur; Hypercholesterolemia; Hypertriglyceridemia; Hyperthyroidism; Hypotestosteronism; Angina pectoris ([place]); Paroxysmal atrial fibrillation ([place]); Encounter for  screening for other disorder; Drug intolerance; Type 2 diabetes mellitus ([place]); Long term current use of anticoagulant therapy (Coumadin); Bulging lumbar disc (T12-L1, L1-2, L2-3, and L4-5); Lumbar foraminal stenosis (severe right L4-5); Chronic lower extremity pain (Location of Primary Source of Pain) (Bilateral) (R>L); Chronic hip pain (Right); Chronic lumbar radicular pain (Right); Myofascial pain syndrome (right suprascapular muscle); Chronic neck pain (Bilateral) (R>L); Chronic shoulder pain (Right); Cervical spondylosis; Chronic cervical radicular pain (Right); Chronic knee pain (Location of Tertiary source of pain) (Left); Long term current use of opiate analgesic; Long term prescription opiate use; Opiate use; Thoracic compression fracture (HCC) (T3, T4, and T8); Cervical facet hypertrophy (Bilateral C3-4); Encounter for therapeutic drug level monitoring; Encounter for chronic pain management; Cervical nerve root disorder; Diabetes mellitus ([place]); Compression fracture of thoracic vertebra ([place]); Cervical facet syndrome (Bilateral) (R>L); Osteoarthritis of knee (Left); Osteoarthritis of hip (Left); and Chronic low back pain (Location of Secondary source of pain) (Bilateral) (R>L) on his problem list.. His primarily concern today is the Back Pain   Pain Type: Chronic pain Pain Location: Back Pain Orientation: Lower, Right Pain Descriptors / Indicators: Aching Pain Frequency: Constant  Date of Last Visit: 01/17/15 Service Provided on Last Visit: Evaluation   Cervical Imaging: Cervical DG complete:  Results for orders placed in visit on 07/11/99  DG Cervical Spine Complete   Narrative FINDINGS CLINICAL:  NECK AND THORACIC SPINE PAIN.  THE PATIENT REPORTS A THORACIC SPINE FRACTURE IN 1/00. COMPLETE CERVICAL SPINE: EIGHT FILMS DEMONSTRATE MINIMAL ANTERIOR SPUR FORMATION AT THE C5-6 LEVEL.  MILD BILATERAL FACET HYPERTROPHY IS DEMONSTRATED AT THE C3-4 LEVEL CAUSING MILD BILATERAL NEURAL FORAMINAL  STENOSIS.  NO SIGNIFICANT UNCINATE SPUR FORMATION IS DEMONSTRATED.  NORMAL VERTEBRAL ALIGNMENT IS NOTED. IMPRESSION: MILD DEGENERATIVE CHANGES AS DESCRIBED ABOVE. THORACIC SPINE: COMPARISON TO 09/25/98.  THE PREVIOUSLY DEMONSTRATED T3, T4 AND T8 VERTEBRAL  COMPRESSION DEFORMITIES ARE STABLE.  NO NEW FRACTURES ARE SEEN AND NO SUBLUXATIONS ARE DEMONSTRATED.  THERE HAS BEEN NO SIGNIFICANT CHANGE IN MILD ANTERIOR SPUR FORMATION AT THE T4-5 AND T8-9 LEVELS. IMPRESSION STABLE T3, T4 AND T8 VERTEBRAL COMPRESSION DEFORMITIES WITH MILD ASSOCIATED SPUR FORMATION.  NO ACUTE ABNORMALITY.   Thoracic Imaging: Thoracic MR wo contrast:  Results for orders placed in visit on 03/23/98  MR Thoracic Spine Wo Contrast   Narrative FINDINGS CLINICAL DATA:   FALL WITH THORACIC FRACTURE. MRI CERVICAL SPINE, LIMITED: THE PATIENT WAS SEDATED AND SNORING AND WAS NOT ABLE TO HOLD STILL.  THERE IS MOTION ARTIFACT ON THIS STUDY. CERVICAL ALIGNMENT APPEARS NORMAL.  NO FRACTURES ARE SEEN. IMPRESSION   Thoracic DG w/swimmers view:  Results for orders placed in visit on 07/11/99  DG Thoracic Spine W/Swimmers   Narrative FINDINGS CLINICAL:  NECK AND THORACIC SPINE PAIN.  THE PATIENT REPORTS A THORACIC SPINE FRACTURE IN 1/00. COMPLETE CERVICAL SPINE: EIGHT FILMS DEMONSTRATE MINIMAL ANTERIOR SPUR FORMATION AT THE C5-6 LEVEL.  MILD BILATERAL FACET HYPERTROPHY IS DEMONSTRATED AT THE C3-4 LEVEL CAUSING MILD BILATERAL NEURAL FORAMINAL STENOSIS.  NO SIGNIFICANT UNCINATE SPUR FORMATION IS DEMONSTRATED.  NORMAL VERTEBRAL ALIGNMENT IS NOTED. IMPRESSION: MILD DEGENERATIVE CHANGES AS DESCRIBED ABOVE. THORACIC SPINE: COMPARISON TO 09/25/98.  THE PREVIOUSLY DEMONSTRATED T3, T4 AND T8 VERTEBRAL COMPRESSION DEFORMITIES ARE STABLE.  NO NEW FRACTURES ARE SEEN AND NO SUBLUXATIONS ARE DEMONSTRATED.  THERE HAS BEEN NO SIGNIFICANT CHANGE IN MILD ANTERIOR SPUR FORMATION AT THE T4-5 AND T8-9 LEVELS. IMPRESSION STABLE T3, T4 AND T8  VERTEBRAL COMPRESSION DEFORMITIES WITH MILD ASSOCIATED SPUR FORMATION.  NO ACUTE ABNORMALITY.   Lumbosacral Imaging: Lumbar MR wo contrast:  Results for orders placed in visit on 10/31/11  MR L Spine Ltd W/O Cm   Narrative * PRIOR REPORT IMPORTED FROM AN EXTERNAL SYSTEM *   PRIOR REPORT IMPORTED FROM THE [facility] WORKFLOW SYSTEM   REASON FOR EXAM:    low back pain  lumbar radiculitis  COMMENTS:   PROCEDURE:     MMR - MMR LUMBAR SPINE WO CONTRAST  - Oct 31 2011  3:03PM   RESULT:     MRI LUMBAR SPINE WITHOUT CONTRAST   HISTORY: Low back pain   COMPARISON: None   TECHNIQUE: Multiplanar and multisequence MRI of the lumbar spine were  obtained, without administration of IV contrast.   FINDINGS:   The vertebral bodies of the lumbar spine are normal in size and alignment.  There is normal bone marrow signal demonstrated throughout the vertebra.  The  intervertebral disc spaces are well-maintained.   The spinal cord is of normal volume and contour. The cord terminates  normally at L1 . The nerve roots of the cauda equina and the filum  terminale  have the usual appearance.   The visualized portions of the SI joints are unremarkable.   The imaged intra-abdominal contents are unremarkable.   T12-L1: Mild right paracentral disc bulge. No evidence of neural foraminal  or central stenosis.   L1-L2: Mild broad-based disc bulge. No evidence of neural foraminal or  central stenosis.   L2-L3: Mild broad-based disc bulge. No evidence of neural foraminal or  central stenosis.   L3-L4: No significant disc bulge. No evidence of neural foraminal or  central  stenosis.   L4-L5: There is a broad-based disc bulge with a superimposed large right  paracentral disc protrusion with inferior migration of disc material which  has mass effect upon the intraspinal nerve roots and the intraspinal  right  L5 nerve root which is posteriorly displaced. There is severe right  foraminal stenosis.  There is no left foraminal stenosis.   L5-S1: No significant disc bulge. No evidence of neural foraminal or  central  stenosis.   IMPRESSION:   1. At L4-L5 there is a broad-based disc bulge with a superimposed large  right paracentral disc protrusion with inferior migration of disc material  which has mass effect upon the intraspinal nerve roots and the intraspinal  right L5 nerve root which are posteriorly displaced. There is severe right  foraminal stenosis.   Dictation Site: 1       Lumbar DG Bending views:  Results for orders placed in visit on 10/01/11  DG Lumbar Spine Complete W/Bend   Narrative * PRIOR REPORT IMPORTED FROM AN EXTERNAL SYSTEM *   PRIOR REPORT IMPORTED FROM THE [facility] WORKFLOW SYSTEM   REASON FOR EXAM:    low back pain bilateral hip pain  COMMENTS:   PROCEDURE:     DXR - DXR LSP COMPLETE W/ FLEX/EXTEN  - Oct 01 2011 11:48AM   RESULT:     Comparison: None.   Findings:  There are 5 lumbar type vertebral bodies. There is slight dextrocurvature  of  the lumbar spine. There are mild age indeterminate compression deformities  of the L1 and L2 vertebral bodies. There is normal alignment. Normal  alignment is maintained with flexion and extension. Intervertebral disc  heights are relatively preserved.   IMPRESSION:  Mild age indeterminate compression deformities of the L1 and L2 vertebral  bodies. Correlate with patient's history and site of pain.   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 No fracture about  the femoral prosthetic  components is seen. There is no dislocation of the prosthetic knee joint.  Surgical drains are present.   IMPRESSION:     The patient is status post RIGHT knee replacement. No  abnormal post-operative changes are identified.   Thank you for this opportunity to contribute to the care of your patient.       Note: Imaging reviewed.  Coagulation Parameters No results found for: INR, LABPROT, APTT, PLT  Verification of the correct person, correct site (including marking of site), and correct procedure were performed and confirmed by the patient.  Consent: Secured. Under the influence of no sedatives a written informed consent was obtained, after having provided information on the risks and possible complications. To fulfill our ethical and legal obligations, as recommended by the American Medical Association's Code of Ethics, we have provided information to the patient about our clinical impression; the nature and purpose of the treatment or procedure; the risks, benefits, and possible complications of the intervention; alternatives; the risk(s) and benefit(s) of the alternative treatment(s) or procedure(s); and the risk(s) and benefit(s) of doing nothing. The patient was provided information about the risks and possible complications associated with the procedure. These include, but  are not limited to, failure to achieve desired goals, infection, bleeding, organ or nerve damage, allergic reactions, paralysis, and death. In the case of spinal procedures these may include, but are not limited to, failure to achieve desired goals, infection, bleeding, organ or nerve damage, allergic reactions, paralysis, and death. In addition, the patient was informed that Medicine is not an exact science; therefore, there is also the possibility of unforeseen risks and possible complications that may result in a catastrophic outcome. The patient indicated having understood very clearly. We have  given the patient no guarantees and we have made no promises. Enough time was given to the patient to ask questions, all of which were answered to the patient's satisfaction.  Consent Attestation: I, the ordering provider, attest that I have discussed with the patient the benefits, risks, side-effects, alternatives, likelihood of achieving goals, and potential problems during recovery for the procedure that I have provided informed consent.  Pre-Procedure Preparation: Safety Precautions: Allergies reviewed. Appropriate site, procedure, and patient were confirmed by following the Joint Commission's Universal Protocol (UP.01.01.01), in the form of a "Time Out". The patient was asked to confirm marked site and procedure, before commencing. The patient was asked about blood thinners, or active infections, both of which were denied. Patient was assessed for positional comfort and all pressure points were checked before starting procedure. Allergies: He has No Known Allergies.. Infection Control Precautions: Sterile technique used. Standard Universal Precautions were taken as recommended by the Department of [facility] for Disease Control and Prevention (CDC). Standard pre-surgical skin prep was conducted. Respiratory hygiene and cough etiquette was practiced. Hand hygiene observed. Safe injection practices and needle disposal techniques followed. SDV (single dose vial) medications used. Medications properly checked for expiration dates and contaminants. Personal protective equipment (PPE) used: Surgical mask. Sterile Radiation-resistant gloves. Monitoring:  As per clinic protocol. Filed Vitals:   08/17/15 1322 08/17/15 1333 08/17/15 1338 08/17/15 1340  BP: 116/82 111/73 115/92 115/92  Pulse:      Temp:      Resp: _0 Height:      Weight:      SpO2: 96% 97% 97% 100%  Calculated BMI: Body mass index is 29.56 kg/(m^2).  Description of Procedure Process:  Time-out: "Time-out"  completed before starting procedure, as per protocol. Position: Prone Target Area: For Epidural Steroid injections, the target area is the  interlaminar space, initially targeting the lower border of the superior vertebral body lamina. Approach: Posterior approach. Area Prepped: Entire Posterior Lumbosacral Region Prepping solution: ChloraPrep (2% chlorhexidine gluconate and 70% isopropyl alcohol) Safety Precautions: Aspiration looking for blood return was conducted prior to all injections. At no point did we inject any substances, as a needle was being advanced. No attempts were made at seeking any paresthesias. Safe injection practices and needle disposal techniques used. Medications properly checked for expiration dates. SDV (single dose vial) medications used.         Description of the Procedure: Protocol guidelines were followed. The patient was placed in position over the fluoroscopy table. The target area was identified and the area prepped in the usual manner. Skin desensitized using vapocoolant spray. Skin & deeper tissues infiltrated with local anesthetic. Appropriate amount of time allowed to pass for local anesthetics to take effect. The procedure needle was introduced through the skin, ipsilateral to the reported pain, and advanced to the target area. Bone was contacted and the needle walked caudad, until the lamina was cleared. The epidural space was identified using "  loss-of-resistance technique" with 2-3 ml of PF-NaCl (0.9% NSS), in a 5cc LOR glass syringe. Proper needle placement secured. Negative aspiration confirmed. Solution injected in intermittent fashion, asking for systemic symptoms every 0.5cc of injectate. The needles were then removed and the area cleansed, making sure to leave some of the prepping solution back to take advantage of its long term bactericidal properties. EBL: None Materials & Medications Used:  Needle(s) Used: 20g - 10cm, Tuohy-style epidural  needle Medication(s): Please see chart orders for medication and dosing details.  Imaging Guidance:  Type of Imaging Technique: Fluoroscopy Guidance (Spinal) Indication(s): Assistance in needle guidance and placement for procedures requiring needle placement in or near specific anatomical locations not easily accessible without such assistance. Exposure Time: Please see nurses notes. Contrast: Before injecting any contrast, we confirmed that the patient did not have an allergy to iodine, shellfish, or radiological contrast. Once satisfactory needle placement was completed at the desired level, radiological contrast was injected. Injection was conducted under continuous fluoroscopic guidance. Injection of contrast accomplished without complications. See chart for type and volume of contrast used. Fluoroscopic Guidance: I was personally present in the fluoroscopy suite, where the patient was placed in position for the procedure, over the fluoroscopy-compatible table. Fluoroscopy was manipulated, using "Tunnel Vision Technique", to obtain the best possible view of the target area, on the affected side. Parallax error was corrected before commencing the procedure. A "direction-depth-direction" technique was used to introduce the needle under continuous pulsed fluoroscopic guidance. Once the target was reached, antero-posterior, oblique, and lateral fluoroscopic projection views were taken to confirm needle placement in all planes. Permanently recorded images stored by scanning into EMR. Interpretation: Intraoperative imaging interpretation by performing Physician. Adequate needle placement confirmed in AP & Oblique Views. Appropriate spread of contrast to desired area. No evidence of afferent or efferent intravascular uptake. No intrathecal or subarachnoid spread observed. Permanent images scanned into the patient's record.  Antibiotic Prophylaxis:  Indication(s): No indications identified. Type:   Antibiotics Given (last 72 hours)    None       Post-operative Assessment:   Complications: No immediate post-treatment complications were observed. Relevant Post-operative Information:  Disposition: Return to clinic for follow-up evaluation. The patient tolerated the entire procedure well. A repeat set of vitals were taken after the procedure and the patient was kept under observation following institutional policy, for this procedure. Post-procedural neurological assessment was performed, showing return to baseline, prior to discharge. The patient was discharged home, once institutional criteria were met. The patient was provided with post-procedure discharge instructions, including a section on how to identify potential problems. Should any problems arise concerning this procedure, the patient was given instructions to immediately contact us, at any time, without hesitation. In any case, we plan to contact the patient by telephone for a follow-up status report regarding this interventional procedure. Comments:  No additional relevant information.  Medications administered during this visit: We administered iopamidol, triamcinolone acetonide, lidocaine (PF), sodium chloride flush, and ropivacaine (PF) 2 mg/ml (0.2%).  Prescriptions ordered during this visit: New Prescriptions   No medications on file    Future Appointments Date Time Provider [place]  09/19/2015 12:45 PM Milinda Pointer, MD [facility] None    Primary Care Physician: No primary care provider on file. Location: [facility] Note by: Nathian Stencil A. Dossie Arbour, M.D, DABA, DABAPM, DABPM, DABIPP, FIPP  Disclaimer:  Medicine is not an exact science. The only guarantee in medicine is that nothing is guaranteed. It is important to note that  the decision to proceed with this intervention was based on the information collected from the patient. The Data and conclusions were drawn from the  patient's questionnaire, the interview, and the physical examination. Because the information was provided in large part by the patient, it cannot be guaranteed that it has not been purposely or unconsciously manipulated. Every effort has been made to obtain as much relevant data as possible for this evaluation. It is important to note that the conclusions that lead to this procedure are derived in large part from the available data. Always take into account that the treatment will also be dependent on availability of resources and existing treatment guidelines, considered by other Pain Management Practitioners as being common knowledge and practice, at the time of the intervention. For Medico-Legal purposes, it is also important to point out that variation in procedural techniques and pharmacological choices are the acceptable norm. The indications, contraindications, technique, and results of the above procedure should only be interpreted and judged by a Board-Certified Interventional Pain Specialist with extensive familiarity and expertise in the same exact procedure and technique. Attempts at providing opinions without similar or greater experience and expertise than that of the treating physician will be considered as inappropriate and unethical, and shall result in a formal complaint to the state medical board and applicable specialty societies.

## 2015-08-17 NOTE — Patient Instructions (Addendum)
GENERAL RISKS AND COMPLICATIONS  What are the risk, side effects and possible complications? Generally speaking, most procedures are safe.  However, with any procedure there are risks, side effects, and the possibility of complications.  The risks and complications are dependent upon the sites that are lesioned, or the type of nerve block to be performed.  The closer the procedure is to the spine, the more serious the risks are.  Great care is taken when placing the radio frequency needles, block needles or lesioning probes, but sometimes complications can occur. 1. Infection: Any time there is an injection through the skin, there is a risk of infection.  This is why sterile conditions are used for these blocks.  There are four possible types of infection. 1. Localized skin infection. 2. Central Nervous System Infection-This can be in the form of Meningitis, which can be deadly. 3. Epidural Infections-This can be in the form of an epidural abscess, which can cause pressure inside of the spine, causing compression of the spinal cord with subsequent paralysis. This would require an emergency surgery to decompress, and there are no guarantees that the patient would recover from the paralysis. 4. Discitis-This is an infection of the intervertebral discs.  It occurs in about 1% of discography procedures.  It is difficult to treat and it may lead to surgery.        2. Pain: the needles have to go through skin and soft tissues, will cause soreness.       3. Damage to internal structures:  The nerves to be lesioned may be near blood vessels or    other nerves which can be potentially damaged.       4. Bleeding: Bleeding is more common if the patient is taking blood thinners such as  aspirin, Coumadin, Ticiid, Plavix, etc., or if he/she have some genetic predisposition  such as hemophilia. Bleeding into the spinal canal can cause compression of the spinal  cord with subsequent paralysis.  This would require an  emergency surgery to  decompress and there are no guarantees that the patient would recover from the  paralysis.       5. Pneumothorax:  Puncturing of a lung is a possibility, every time a needle is introduced in  the area of the chest or upper back.  Pneumothorax refers to free air around the  collapsed lung(s), inside of the thoracic cavity (chest cavity).  Another two possible  complications related to a similar event would include: Hemothorax and Chylothorax.   These are variations of the Pneumothorax, where instead of air around the collapsed  lung(s), you may have blood or chyle, respectively.       6. Spinal headaches: They may occur with any procedures in the area of the spine.       7. Persistent CSF (Cerebro-Spinal Fluid) leakage: This is a rare problem, but may occur  with prolonged intrathecal or epidural catheters either due to the formation of a fistulous  track or a dural tear.       8. Nerve damage: By working so close to the spinal cord, there is always a possibility of  nerve damage, which could be as serious as a permanent spinal cord injury with  paralysis.       9. Death:  Although rare, severe deadly allergic reactions known as "Anaphylactic  reaction" can occur to any of the medications used.      10. Worsening of the symptoms:  We can always make thing worse.    What are the chances of something like this happening? Chances of any of this occuring are extremely low.  By statistics, you have more of a chance of getting killed in a motor vehicle accident: while driving to the hospital than any of the above occurring .  Nevertheless, you should be aware that they are possibilities.  In general, it is similar to taking a shower.  Everybody knows that you can slip, hit your head and get killed.  Does that mean that you should not shower again?  Nevertheless always keep in mind that statistics do not mean anything if you happen to be on the wrong side of them.  Even if a procedure has a 1  (one) in a 1,000,000 (million) chance of going wrong, it you happen to be that one..Also, keep in mind that by statistics, you have more of a chance of having something go wrong when taking medications.  Who should not have this procedure? If you are on a blood thinning medication (e.g. Coumadin, Plavix, see list of "Blood Thinners"), or if you have an active infection going on, you should not have the procedure.  If you are taking any blood thinners, please inform your physician.  How should I prepare for this procedure?  Do not eat or drink anything at least six hours prior to the procedure.  Bring a driver with you .  It cannot be a taxi.  Come accompanied by an adult that can drive you back, and that is strong enough to help you if your legs get weak or numb from the local anesthetic.  Take all of your medicines the morning of the procedure with just enough water to swallow them.  If you have diabetes, make sure that you are scheduled to have your procedure done first thing in the morning, whenever possible.  If you have diabetes, take only half of your insulin dose and notify our nurse that you have done so as soon as you arrive at the clinic.  If you are diabetic, but only take blood sugar pills (oral hypoglycemic), then do not take them on the morning of your procedure.  You may take them after you have had the procedure.  Do not take aspirin or any aspirin-containing medications, at least eleven (11) days prior to the procedure.  They may prolong bleeding.  Wear loose fitting clothing that may be easy to take off and that you would not mind if it got stained with Betadine or blood.  Do not wear any jewelry or perfume  Remove any nail coloring.  It will interfere with some of our monitoring equipment.  NOTE: Remember that this is not meant to be interpreted as a complete list of all possible complications.  Unforeseen problems may occur.  BLOOD THINNERS The following drugs  contain aspirin or other products, which can cause increased bleeding during surgery and should not be taken for 2 weeks prior to and 1 week after surgery.  If you should need take something for relief of minor pain, you may take acetaminophen which is found in Tylenol,m Datril, Anacin-3 and Panadol. It is not blood thinner. The products listed below are.  Do not take any of the products listed below in addition to any listed on your instruction sheet.  A.P.C or A.P.C with Codeine Codeine Phosphate Capsules #3 Ibuprofen Ridaura  ABC compound Congesprin Imuran rimadil  Advil Cope Indocin Robaxisal  Alka-Seltzer Effervescent Pain Reliever and Antacid Coricidin or Coricidin-D  Indomethacin Rufen    Alka-Seltzer plus Cold Medicine Cosprin Ketoprofen S-A-C Tablets  Anacin Analgesic Tablets or Capsules Coumadin Korlgesic Salflex  Anacin Extra Strength Analgesic tablets or capsules CP-2 Tablets Lanoril Salicylate  Anaprox Cuprimine Capsules Levenox Salocol  Anexsia-D Dalteparin Magan Salsalate  Anodynos Darvon compound Magnesium Salicylate Sine-off  Ansaid Dasin Capsules Magsal Sodium Salicylate  Anturane Depen Capsules Marnal Soma  APF Arthritis pain formula Dewitt's Pills Measurin Stanback  Argesic Dia-Gesic Meclofenamic Sulfinpyrazone  Arthritis Bayer Timed Release Aspirin Diclofenac Meclomen Sulindac  Arthritis pain formula Anacin Dicumarol Medipren Supac  Analgesic (Safety coated) Arthralgen Diffunasal Mefanamic Suprofen  Arthritis Strength Bufferin Dihydrocodeine Mepro Compound Suprol  Arthropan liquid Dopirydamole Methcarbomol with Aspirin Synalgos  ASA tablets/Enseals Disalcid Micrainin Tagament  Ascriptin Doan's Midol Talwin  Ascriptin A/D Dolene Mobidin Tanderil  Ascriptin Extra Strength Dolobid Moblgesic Ticlid  Ascriptin with Codeine Doloprin or Doloprin with Codeine Momentum Tolectin  Asperbuf Duoprin Mono-gesic Trendar  Aspergum Duradyne Motrin or Motrin IB Triminicin  Aspirin  plain, buffered or enteric coated Durasal Myochrisine Trigesic  Aspirin Suppositories Easprin Nalfon Trillsate  Aspirin with Codeine Ecotrin Regular or Extra Strength Naprosyn Uracel  Atromid-S Efficin Naproxen Ursinus  Auranofin Capsules Elmiron Neocylate Vanquish  Axotal Emagrin Norgesic Verin  Azathioprine Empirin or Empirin with Codeine Normiflo Vitamin E  Azolid Emprazil Nuprin Voltaren  Bayer Aspirin plain, buffered or children's or timed BC Tablets or powders Encaprin Orgaran Warfarin Sodium  Buff-a-Comp Enoxaparin Orudis Zorpin  Buff-a-Comp with Codeine Equegesic Os-Cal-Gesic   Buffaprin Excedrin plain, buffered or Extra Strength Oxalid   Bufferin Arthritis Strength Feldene Oxphenbutazone   Bufferin plain or Extra Strength Feldene Capsules Oxycodone with Aspirin   Bufferin with Codeine Fenoprofen Fenoprofen Pabalate or Pabalate-SF   Buffets II Flogesic Panagesic   Buffinol plain or Extra Strength Florinal or Florinal with Codeine Panwarfarin   Buf-Tabs Flurbiprofen Penicillamine   Butalbital Compound Four-way cold tablets Penicillin   Butazolidin Fragmin Pepto-Bismol   Carbenicillin Geminisyn Percodan   Carna Arthritis Reliever Geopen Persantine   Carprofen Gold's salt Persistin   Chloramphenicol Goody's Phenylbutazone   Chloromycetin Haltrain Piroxlcam   Clmetidine heparin Plaquenil   Cllnoril Hyco-pap Ponstel   Clofibrate Hydroxy chloroquine Propoxyphen         Before stopping any of these medications, be sure to consult the physician who ordered them.  Some, such as Coumadin (Warfarin) are ordered to prevent or treat serious conditions such as "deep thrombosis", "pumonary embolisms", and other heart problems.  The amount of time that you may need off of the medication may also vary with the medication and the reason for which you were taking it.  If you are taking any of these medications, please make sure you notify your pain physician before you undergo any  procedures.         Epidural Steroid Injection Patient Information  Description: The epidural space surrounds the nerves as they exit the spinal cord.  In some patients, the nerves can be compressed and inflamed by a bulging disc or a tight spinal canal (spinal stenosis).  By injecting steroids into the epidural space, we can bring irritated nerves into direct contact with a potentially helpful medication.  These steroids act directly on the irritated nerves and can reduce swelling and inflammation which often leads to decreased pain.  Epidural steroids may be injected anywhere along the spine and from the neck to the low back depending upon the location of your pain.   After numbing the skin with local anesthetic (like Novocaine), a small needle is passed   into the epidural space slowly.  You may experience a sensation of pressure while this is being done.  The entire block usually last less than 10 minutes.  Conditions which may be treated by epidural steroids:   Low back and leg pain  Neck and arm pain  Spinal stenosis  Post-laminectomy syndrome  Herpes zoster (shingles) pain  Pain from compression fractures  Preparation for the injection:  1. Do not eat any solid food or dairy products within 8 hours of your appointment.  2. You may drink clear liquids up to 3 hours before appointment.  Clear liquids include water, black coffee, juice or soda.  No milk or cream please. 3. You may take your regular medication, including pain medications, with a sip of water before your appointment  Diabetics should hold regular insulin (if taken separately) and take 1/2 normal NPH dos the morning of the procedure.  Carry some sugar containing items with you to your appointment. 4. A driver must accompany you and be prepared to drive you home after your procedure.  5. Bring all your current medications with your. 6. An IV may be inserted and sedation may be given at the discretion of the  physician.   7. A blood pressure cuff, EKG and other monitors will often be applied during the procedure.  Some patients may need to have extra oxygen administered for a short period. 8. You will be asked to provide medical information, including your allergies, prior to the procedure.  We must know immediately if you are taking blood thinners (like Coumadin/Warfarin)  Or if you are allergic to IV iodine contrast (dye). We must know if you could possible be pregnant.  Possible side-effects:  Bleeding from needle site  Infection (rare, may require surgery)  Nerve injury (rare)  Numbness & tingling (temporary)  Difficulty urinating (rare, temporary)  Spinal headache ( a headache worse with upright posture)  Light -headedness (temporary)  Pain at injection site (several days)  Decreased blood pressure (temporary)  Weakness in arm/leg (temporary)  Pressure sensation in back/neck (temporary)  Call if you experience:  Fever/chills associated with headache or increased back/neck pain.  Headache worsened by an upright position.  New onset weakness or numbness of an extremity below the injection site  Hives or difficulty breathing (go to the emergency room)  Inflammation or drainage at the infection site  Severe back/neck pain  Any new symptoms which are concerning to you  Please note:  Although the local anesthetic injected can often make your back or neck feel good for several hours after the injection, the pain will likely return.  It takes 3-7 days for steroids to work in the epidural space.  You may not notice any pain relief for at least that one week.  If effective, we will often do a series of three injections spaced 3-6 weeks apart to maximally decrease your pain.  After the initial series, we generally will wait several months before considering a repeat injection of the same type.  If you have any questions, please call [phone] [facility]  General Discharge Instructions :  If you need to reach your doctor call: Monday-Friday 8:00 am - 4:00 pm at [phone] or toll free [phone].  After clinic hours [phone] to have operator reach doctor.  Bring all of your medication bottles to all your appointments in the pain clinic.  To cancel or reschedule your appointment with Pain Management please remember to  call 24 hours in advance to avoid a fee.  Refer to the educational materials which you have been given on: General Risks, I had my Procedure. Discharge Instructions, Post Sedation.  Post Procedure Instructions:  The drugs you were given will stay in your system until tomorrow, so for the next 24 hours you should not drive, make any legal decisions or drink any alcoholic beverages.  You may eat anything you prefer, but it is better to start with liquids then soups and crackers, and gradually work up to solid foods.  Please notify your doctor immediately if you have any unusual bleeding, trouble breathing or pain that is not related to your normal pain.  Depending on the type of procedure that was done, some parts of your body may feel week and/or numb.  This usually clears up by tonight or the next day.  Walk with the use of an assistive device or accompanied by an adult for the 24 hours.  You may use ice on the affected area for the first 24 hours.  Put ice in a Ziploc bag and cover with a towel and place against area 15 minutes on 15 minutes off.  You may switch to heat after 24 hours.Epidural Steroid Injection Patient Information  Description: The epidural space surrounds the nerves as they exit the spinal cord.  In some patients, the nerves can be compressed and inflamed by a bulging disc or a tight spinal canal (spinal stenosis).  By injecting steroids into the epidural space, we can bring irritated nerves into direct contact with a potentially helpful  medication.  These steroids act directly on the irritated nerves and can reduce swelling and inflammation which often leads to decreased pain.  Epidural steroids may be injected anywhere along the spine and from the neck to the low back depending upon the location of your pain.   After numbing the skin with local anesthetic (like Novocaine), a small needle is passed into the epidural space slowly.  You may experience a sensation of pressure while this is being done.  The entire block usually last less than 10 minutes.  Conditions which may be treated by epidural steroids:   Low back and leg pain  Neck and arm pain  Spinal stenosis  Post-laminectomy syndrome  Herpes zoster (shingles) pain  Pain from compression fractures  Preparation for the injection:  9. Do not eat any solid food or dairy products within 8 hours of your appointment.  10. You may drink clear liquids up to 3 hours before appointment.  Clear liquids include water, black coffee, juice or soda.  No milk or cream please. 11. You may take your regular medication, including pain medications, with a sip of water before your appointment  Diabetics should hold regular insulin (if taken separately) and take 1/2 normal NPH dos the morning of the procedure.  Carry some sugar containing items with you to your appointment. 12. A driver must accompany you and be prepared to drive you home after your procedure.  13. Bring all your current medications with your. 14. An IV may be inserted and sedation may be given at the discretion of the physician.   15. A blood pressure cuff, EKG and other monitors will often be applied during the procedure.  Some patients may need to have extra oxygen administered for a short period. 16. You will be asked to provide medical information, including your allergies, prior to the procedure.  We must know immediately if you are taking blood  thinners (like Coumadin/Warfarin)  Or if you are allergic to IV iodine  contrast (dye). We must know if you could possible be pregnant.  Possible side-effects:  Bleeding from needle site  Infection (rare, may require surgery)  Nerve injury (rare)  Numbness & tingling (temporary)  Difficulty urinating (rare, temporary)  Spinal headache ( a headache worse with upright posture)  Light -headedness (temporary)  Pain at injection site (several days)  Decreased blood pressure (temporary)  Weakness in arm/leg (temporary)  Pressure sensation in back/neck (temporary)  Call if you experience:  Fever/chills associated with headache or increased back/neck pain.  Headache worsened by an upright position.  New onset weakness or numbness of an extremity below the injection site  Hives or difficulty breathing (go to the emergency room)  Inflammation or drainage at the infection site  Severe back/neck pain  Any new symptoms which are concerning to you  Please note:  Although the local anesthetic injected can often make your back or neck feel good for several hours after the injection, the pain will likely return.  It takes 3-7 days for steroids to work in the epidural space.  You may not notice any pain relief for at least that one week.  If effective, we will often do a series of three injections spaced 3-6 weeks apart to maximally decrease your pain.  After the initial series, we generally will wait several months before considering a repeat injection of the same type.  If you have any questions, please call [phone] [facility]   Dr. 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 The type of surgery required will be decided by either a neurosurgeon or an orthopedic surgeon that specializes in spine surgery. However, I did mention to him the possibility of a foraminotomy versus a  laminectomy in order to decompress the area. Since the last MRI done on 10/31/2011 did show an L4-5 disc bulge with a superimposed right paracentral disc protrusion, it may be likely that he would also require a discectomy.

## 2015-08-18 ENCOUNTER — Telehealth: Payer: Self-pay

## 2015-08-18 NOTE — Telephone Encounter (Signed)
Post procedure phone call.  Patient states he is doing good.  

## 2015-09-19 ENCOUNTER — Ambulatory Visit: Payer: Medicare Other | Admitting: Pain Medicine

## 2015-10-05 DIAGNOSIS — Z79899 Other long term (current) drug therapy: Secondary | ICD-10-CM | POA: Insufficient documentation

## 2015-10-05 DIAGNOSIS — N401 Enlarged prostate with lower urinary tract symptoms: Secondary | ICD-10-CM | POA: Insufficient documentation

## 2016-04-01 ENCOUNTER — Ambulatory Visit (HOSPITAL_BASED_OUTPATIENT_CLINIC_OR_DEPARTMENT_OTHER): Payer: Medicare Other | Admitting: Pain Medicine

## 2016-04-01 ENCOUNTER — Ambulatory Visit
Admission: RE | Admit: 2016-04-01 | Discharge: 2016-04-01 | Disposition: A | Discharge status: Home / Self Care | Payer: Medicare Other | Source: Ambulatory Visit | Attending: Pain Medicine | Admitting: Pain Medicine

## 2016-04-01 ENCOUNTER — Encounter: Payer: Self-pay | Admitting: Pain Medicine

## 2016-04-01 VITALS — BP 111/87 | HR 106 | Temp 97.7°F | Resp 14 | Ht 74.0 in | Wt 228.0 lb

## 2016-04-01 DIAGNOSIS — M79605 Pain in left leg: Secondary | ICD-10-CM | POA: Insufficient documentation

## 2016-04-01 DIAGNOSIS — M48061 Spinal stenosis, lumbar region without neurogenic claudication: Secondary | ICD-10-CM | POA: Diagnosis not present

## 2016-04-01 DIAGNOSIS — G8929 Other chronic pain: Secondary | ICD-10-CM

## 2016-04-01 DIAGNOSIS — M9983 Other biomechanical lesions of lumbar region: Secondary | ICD-10-CM

## 2016-04-01 DIAGNOSIS — M79604 Pain in right leg: Secondary | ICD-10-CM | POA: Insufficient documentation

## 2016-04-01 DIAGNOSIS — M5416 Radiculopathy, lumbar region: Secondary | ICD-10-CM | POA: Insufficient documentation

## 2016-04-01 MED ORDER — IOPAMIDOL (ISOVUE-M 200) INJECTION 41%
10.0000 mL | Freq: Once | INTRAMUSCULAR | Status: AC
  Administered 2016-04-01: 10 mL via EPIDURAL
  Filled 2016-04-01: qty 10

## 2016-04-01 MED ORDER — SODIUM CHLORIDE 0.9 % IJ SOLN
INTRAMUSCULAR | Status: AC
  Filled 2016-04-01: qty 10

## 2016-04-01 MED ORDER — TRIAMCINOLONE ACETONIDE 40 MG/ML IJ SUSP
40.0000 mg | Freq: Once | INTRAMUSCULAR | Status: AC
  Administered 2016-04-01: 40 mg
  Filled 2016-04-01: qty 1

## 2016-04-01 MED ORDER — ROPIVACAINE HCL 2 MG/ML IJ SOLN
2.0000 mL | Freq: Once | INTRAMUSCULAR | Status: AC
  Administered 2016-04-01: 2 mL via EPIDURAL
  Filled 2016-04-01: qty 10

## 2016-04-01 MED ORDER — SODIUM CHLORIDE 0.9% FLUSH
2.0000 mL | Freq: Once | INTRAVENOUS | Status: AC
  Administered 2016-04-01: 2 mL

## 2016-04-01 MED ORDER — LIDOCAINE HCL (PF) 1 % IJ SOLN
10.0000 mL | Freq: Once | INTRAMUSCULAR | Status: AC
  Administered 2016-04-01: 10 mL
  Filled 2016-04-01: qty 10

## 2016-04-01 NOTE — Progress Notes (Signed)
Patient's Name: Alan Stafford  MRN: [id number]  Referring Provider: No ref. provider found  DOB: [birth year]  PCP: Lindwood Qua, MD  DOS: 04/01/2016  Note by: Sydnee Levans. Laban Emperor, MD  Service setting: Ambulatory outpatient  Location: [facility] (AMB) Pain Management Facility  Visit type: Procedure  Specialty: Interventional Pain Management  Patient type: Established   Primary Reason for Visit: Interventional Pain Management Treatment. CC: Back Pain (low)  Procedure:  Anesthesia, Analgesia, Anxiolysis:  Type: Palliative Inter-Laminar Epidural Steroid Injection Region: Lumbar Level: L4-5 Level. Laterality: Right-Sided Paramedial  Type: Local Anesthesia Local Anesthetic: Lidocaine 1% Route: Infiltration ([place]/IM) IV Access: Declined Sedation: Declined  Indication(s): Analgesia          Indications: 1. Chronic lower extremity pain (Location of Primary Source of Pain) (Bilateral) (R>L)   2. Chronic lumbar radicular pain (Right)   3. 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 (year old), male patient, seen today for interventional treatment. He  has a past surgical history that includes Knee surgery (Right). His primarily concern today is the Back Pain (low)  Initial Vital Signs: Blood pressure (!) 151/79, pulse (!) 110, temperature 97.7 F (36.5 C), resp. rate (!) 95, height 6\' 2"  (1.88 m), weight 228 lb (103.4 kg). BMI: 29.27 kg/m  Risk Assessment: Allergies: Reviewed. He is allergic to statins and sulfa antibiotics.  Allergy Precautions: None required Coagulopathies: "Reviewed. None identified.  Blood-thinner therapy: None at this time Active Infection(s): Reviewed. None identified. Alan Stafford is afebrile  Site Confirmation: Alan Stafford was asked to confirm the procedure and laterality before marking the  site Procedure checklist: Completed Consent: Before the procedure and under the influence of no sedative(s), amnesic(s), or anxiolytics, the patient was informed of the treatment options, risks and possible complications. To fulfill our ethical and legal obligations, as recommended by the American Medical Association's Code of Ethics, I have informed the patient of my clinical impression; the nature and purpose of the treatment or procedure; the risks, benefits, and possible complications of the intervention; the alternatives, including doing nothing; the risk(s) and benefit(s) of the alternative treatment(s) or procedure(s); and the risk(s) and benefit(s) of doing nothing. The patient was provided information about the general risks and possible complications associated with the procedure. These may include, but are not limited to: failure to achieve desired goals, infection, bleeding, organ or nerve damage, allergic reactions, paralysis, and death. In addition, the patient was informed of those risks and complications associated to Spine-related procedures, such as failure to decrease pain; infection (i.e.: Meningitis, epidural or intraspinal abscess); bleeding (i.e.: epidural hematoma, subarachnoid hemorrhage, or any other type of intraspinal or peri-dural bleeding); organ or nerve damage (i.e.: Any type of peripheral nerve, nerve root, or spinal cord injury) with subsequent damage to sensory, motor, and/or autonomic systems, resulting in permanent pain, numbness, and/or weakness of one or several areas of the body; allergic reactions; (i.e.: anaphylactic reaction); and/or death. Furthermore, the patient was informed of those risks and complications associated with the medications. These include, but are not limited to: allergic reactions (i.e.: anaphylactic or anaphylactoid reaction(s)); adrenal axis suppression; blood sugar elevation that in diabetics may result in ketoacidosis or comma; water retention  that in patients with history of congestive heart failure may result in shortness of breath, pulmonary edema, and decompensation with resultant heart failure; weight gain; swelling or  edema; medication-induced neural toxicity; particulate matter embolism and blood vessel occlusion with resultant organ, and/or nervous system infarction; and/or aseptic necrosis of one or more joints. Finally, the patient was informed that Medicine is not an exact science; therefore, there is also the possibility of unforeseen or unpredictable risks and/or possible complications that may result in a catastrophic outcome. The patient indicated having understood very clearly. We have given the patient no guarantees and we have made no promises. Enough time was given to the patient to ask questions, all of which were answered to the patient's satisfaction. Alan Stafford has indicated that he wanted to continue with the procedure. Attestation: I, the ordering provider, attest that I have discussed with the patient the benefits, risks, side-effects, alternatives, likelihood of achieving goals, and potential problems during recovery for the procedure that I have provided informed consent. Date: 04/01/2016; Time: 9:09 AM  Pre-Procedure Preparation:  Monitoring: As per clinic protocol. Respiration, ETCO2, SpO2, BP, heart rate and rhythm monitor placed and checked for adequate function Safety Precautions: Patient was assessed for positional comfort and pressure points before starting the procedure. Time-out: I initiated and conducted the "Time-out" before starting the procedure, as per protocol. The patient was asked to participate by confirming the accuracy of the "Time Out" information. Verification of the correct person, site, and procedure were performed and confirmed by me, the nursing staff, and the patient. "Time-out" conducted as per Joint Commission's Universal Protocol (UP.01.01.01). "Time-out" Date & Time: 04/01/2016; 0925  hrs.  Description of Procedure Process:   Position: Prone with head of the table was raised to facilitate breathing. Target Area: The interlaminar space, initially targeting the lower laminar border of the superior vertebral body. Approach: Paramedial approach. Area Prepped: Entire Posterior Lumbar Region Prepping solution: ChloraPrep (2% chlorhexidine gluconate and 70% isopropyl alcohol) Safety Precautions: Aspiration looking for blood return was conducted prior to all injections. At no point did we inject any substances, as a needle was being advanced. No attempts were made at seeking any paresthesias. Safe injection practices and needle disposal techniques used. Medications properly checked for expiration dates. SDV (single dose vial) medications used. Description of the Procedure: Protocol guidelines were followed. The procedure needle was introduced through the skin, ipsilateral to the reported pain, and advanced to the target area. Bone was contacted and the needle walked caudad, until the lamina was cleared. The epidural space was identified using "loss-of-resistance technique" with 2-3 ml of PF-NaCl (0.9% NSS), in a 5cc LOR glass syringe. Start Time: 0925 hrs. End Time: 0933 hrs. Materials:  Needle(s) Type: Epidural needle Gauge: 22G Length: 3.5-in Medication(s): We administered iopamidol, triamcinolone acetonide, lidocaine (PF), sodium chloride flush, and ropivacaine (PF) 2 mg/mL (0.2%). Please see chart orders for dosing details.  Imaging Guidance (Spinal):  Type of Imaging Technique: Fluoroscopy Guidance (Spinal) Indication(s): Assistance in needle guidance and placement for procedures requiring needle placement in or near specific anatomical locations not easily accessible without such assistance. Exposure Time: Please see nurses notes. Contrast: Before injecting any contrast, we confirmed that the patient did not have an allergy to iodine, shellfish, or radiological contrast. Once  satisfactory needle placement was completed at the desired level, radiological contrast was injected. Contrast injected under live fluoroscopy. No contrast complications. See chart for type and volume of contrast used. Fluoroscopic Guidance: I was personally present during the use of fluoroscopy. "Tunnel Vision Technique" used to obtain the best possible view of the target area. Parallax error corrected before commencing the procedure. "Direction-depth-direction" technique used to introduce  the needle under continuous pulsed fluoroscopy. Once target was reached, antero-posterior, oblique, and lateral fluoroscopic projection used confirm needle placement in all planes. Images permanently stored in EMR. Interpretation: I personally interpreted the imaging intraoperatively. Adequate needle placement confirmed in multiple planes. Appropriate spread of contrast into desired area was observed. No evidence of afferent or efferent intravascular uptake. No intrathecal or subarachnoid spread observed. Permanent images saved into the patient's record.  Antibiotic Prophylaxis:  Indication(s): None identified Antibiotic given: None  Post-operative Assessment:  EBL: None Complications: No immediate post-treatment complications observed by team, or reported by patient. Note: The patient tolerated the entire procedure well. A repeat set of vitals were taken after the procedure and the patient was kept under observation following institutional policy, for this type of procedure. Post-procedural neurological assessment was performed, showing return to baseline, prior to discharge. The patient was provided with post-procedure discharge instructions, including a section on how to identify potential problems. Should any problems arise concerning this procedure, the patient was given instructions to immediately contact us, at any time, without hesitation. In any case, we plan to contact the patient by telephone for a follow-up  status report regarding this interventional procedure. Comments:  No additional relevant information.  Plan of Care  Disposition: Discharge home  Discharge Date & Time: 04/01/2016; 0936 hrs.  Physician-requested Follow-up:  Return in about 2 weeks (around 04/15/2016) for Post-Procedure evaluation.  Future Appointments Date Time Provider Department Center  05/13/2016 1:15 PM Delano Metz, MD [facility] None   Medications ordered for procedure: Meds ordered this encounter  Medications  . iopamidol (ISOVUE-M) 41 % intrathecal injection 10 mL  . triamcinolone acetonide (KENALOG-40) injection 40 mg  . lidocaine (PF) (XYLOCAINE) 1 % injection 10 mL  . sodium chloride flush (NS) 0.9 % injection 2 mL  . ropivacaine (PF) 2 mg/mL (0.2%) (NAROPIN) injection 2 mL  . sodium chloride 0.9 % injection    Lowell Guitar, Patti: cabinet override   Medications administered: We administered iopamidol, triamcinolone acetonide, lidocaine (PF), sodium chloride flush, and ropivacaine (PF) 2 mg/mL (0.2%).  See the medical record for exact dosing, route, and time of administration.  Lab-work, Procedure(s), & Referral(s) Ordered: Orders Placed This Encounter  Procedures  . Lumbar Epidural Injection  . DG C-Arm 1-60 Min-No Report  . Discharge instructions  . Follow-up  . Informed Consent Details: Transcribe to consent form and obtain patient signature  . Provider attestation of informed consent for procedure/surgical case  . Verify informed consent   Imaging Ordered: No results found for this or any previous visit. New Prescriptions   No medications on file   Primary Care Physician: Lindwood Qua, MD Location: [facility] Note by: Sydnee Levans. Laban Emperor, M.D, DABA, DABAPM, DABPM, DABIPP, FIPP Date: 04/01/2016; Time: 9:50 AM  Disclaimer:  Medicine is not an exact science. The only guarantee in medicine is that nothing is guaranteed. It is important to note that the decision to  proceed with this intervention was based on the information collected from the patient. The Data and conclusions were drawn from the patient's questionnaire, the interview, and the physical examination. Because the information was provided in large part by the patient, it cannot be guaranteed that it has not been purposely or unconsciously manipulated. Every effort has been made to obtain as much relevant data as possible for this evaluation. It is important to note that the conclusions that lead to this procedure are derived in large part from the available data. Always take into account  that the treatment will also be dependent on availability of resources and existing treatment guidelines, considered by other Pain Management Practitioners as being common knowledge and practice, at the time of the intervention. For Medico-Legal purposes, it is also important to point out that variation in procedural techniques and pharmacological choices are the acceptable norm. The indications, contraindications, technique, and results of the above procedure should only be interpreted and judged by a Board-Certified Interventional Pain Specialist with extensive familiarity and expertise in the same exact procedure and technique. Attempts at providing opinions without similar or greater experience and expertise than that of the treating physician will be considered as inappropriate and unethical, and shall result in a formal complaint to the state medical board and applicable specialty societies.  Instructions provided at this appointment: There are no Patient Instructions on file for this visit.

## 2016-04-01 NOTE — Progress Notes (Signed)
Safety precautions to be maintained throughout the outpatient stay will include: orient to surroundings, keep bed in low position, maintain call bell within reach at all times, provide assistance with transfer out of bed and ambulation.  

## 2016-04-02 ENCOUNTER — Telehealth: Payer: Self-pay | Admitting: *Deleted

## 2016-04-02 NOTE — Telephone Encounter (Signed)
Attempted to call patient for post procedure follow up. No answer.

## 2016-04-22 ENCOUNTER — Encounter: Payer: Self-pay | Admitting: Emergency Medicine

## 2016-04-22 ENCOUNTER — Inpatient Hospital Stay
Admission: EM | Admit: 2016-04-22 | Discharge: 2016-04-25 | DRG: 871 | Disposition: A | Discharge status: Home / Self Care | Payer: Medicare Other | Attending: Internal Medicine | Admitting: Internal Medicine

## 2016-04-22 ENCOUNTER — Emergency Department: Payer: Medicare Other

## 2016-04-22 DIAGNOSIS — Z7984 Long term (current) use of oral hypoglycemic drugs: Secondary | ICD-10-CM

## 2016-04-22 DIAGNOSIS — J189 Pneumonia, unspecified organism: Secondary | ICD-10-CM | POA: Diagnosis present

## 2016-04-22 DIAGNOSIS — J45909 Unspecified asthma, uncomplicated: Secondary | ICD-10-CM | POA: Diagnosis present

## 2016-04-22 DIAGNOSIS — I2510 Atherosclerotic heart disease of native coronary artery without angina pectoris: Secondary | ICD-10-CM | POA: Diagnosis present

## 2016-04-22 DIAGNOSIS — R0602 Shortness of breath: Secondary | ICD-10-CM | POA: Diagnosis present

## 2016-04-22 DIAGNOSIS — E1159 Type 2 diabetes mellitus with other circulatory complications: Secondary | ICD-10-CM

## 2016-04-22 DIAGNOSIS — Z7901 Long term (current) use of anticoagulants: Secondary | ICD-10-CM

## 2016-04-22 DIAGNOSIS — Z7982 Long term (current) use of aspirin: Secondary | ICD-10-CM | POA: Diagnosis not present

## 2016-04-22 DIAGNOSIS — Z87442 Personal history of urinary calculi: Secondary | ICD-10-CM | POA: Diagnosis not present

## 2016-04-22 DIAGNOSIS — M1990 Unspecified osteoarthritis, unspecified site: Secondary | ICD-10-CM | POA: Diagnosis present

## 2016-04-22 DIAGNOSIS — R042 Hemoptysis: Secondary | ICD-10-CM | POA: Diagnosis present

## 2016-04-22 DIAGNOSIS — N183 Chronic kidney disease, stage 3 (moderate): Secondary | ICD-10-CM | POA: Diagnosis present

## 2016-04-22 DIAGNOSIS — A419 Sepsis, unspecified organism: Secondary | ICD-10-CM

## 2016-04-22 DIAGNOSIS — I480 Paroxysmal atrial fibrillation: Secondary | ICD-10-CM | POA: Diagnosis present

## 2016-04-22 DIAGNOSIS — E0590 Thyrotoxicosis, unspecified without thyrotoxic crisis or storm: Secondary | ICD-10-CM | POA: Diagnosis present

## 2016-04-22 DIAGNOSIS — E1121 Type 2 diabetes mellitus with diabetic nephropathy: Secondary | ICD-10-CM | POA: Diagnosis present

## 2016-04-22 DIAGNOSIS — I129 Hypertensive chronic kidney disease with stage 1 through stage 4 chronic kidney disease, or unspecified chronic kidney disease: Secondary | ICD-10-CM | POA: Diagnosis present

## 2016-04-22 DIAGNOSIS — Z820 Family history of epilepsy and other diseases of the nervous system: Secondary | ICD-10-CM | POA: Diagnosis not present

## 2016-04-22 DIAGNOSIS — E7800 Pure hypercholesterolemia, unspecified: Secondary | ICD-10-CM | POA: Diagnosis present

## 2016-04-22 DIAGNOSIS — Z7951 Long term (current) use of inhaled steroids: Secondary | ICD-10-CM | POA: Diagnosis not present

## 2016-04-22 DIAGNOSIS — Z888 Allergy status to other drugs, medicaments and biological substances status: Secondary | ICD-10-CM | POA: Diagnosis not present

## 2016-04-22 DIAGNOSIS — I10 Essential (primary) hypertension: Secondary | ICD-10-CM | POA: Diagnosis present

## 2016-04-22 DIAGNOSIS — Z8249 Family history of ischemic heart disease and other diseases of the circulatory system: Secondary | ICD-10-CM | POA: Diagnosis not present

## 2016-04-22 DIAGNOSIS — E1122 Type 2 diabetes mellitus with diabetic chronic kidney disease: Secondary | ICD-10-CM | POA: Diagnosis present

## 2016-04-22 DIAGNOSIS — Z882 Allergy status to sulfonamides status: Secondary | ICD-10-CM | POA: Diagnosis not present

## 2016-04-22 DIAGNOSIS — K219 Gastro-esophageal reflux disease without esophagitis: Secondary | ICD-10-CM | POA: Diagnosis present

## 2016-04-22 HISTORY — DX: Sepsis, unspecified organism: A41.9

## 2016-04-22 HISTORY — DX: Hemoptysis: R04.2

## 2016-04-22 HISTORY — DX: Unspecified atrial fibrillation: I48.91

## 2016-04-22 HISTORY — DX: Pneumonia, unspecified organism: J18.9

## 2016-04-22 LAB — CBC
HCT: 36.6 % — ABNORMAL LOW (ref 40.0–52.0)
Hemoglobin: 11.4 g/dL — ABNORMAL LOW (ref 13.0–18.0)
MCH: 23.3 pg — AB (ref 26.0–34.0)
MCHC: 31.2 g/dL — ABNORMAL LOW (ref 32.0–36.0)
MCV: 74.7 fL — ABNORMAL LOW (ref 80.0–100.0)
PLATELETS: 245 10*3/uL (ref 150–440)
RBC: 4.9 MIL/uL (ref 4.40–5.90)
RDW: 18.6 % — ABNORMAL HIGH (ref 11.5–14.5)
WBC: 8.3 10*3/uL (ref 3.8–10.6)

## 2016-04-22 LAB — DIFFERENTIAL
BASOS ABS: 0 10*3/uL (ref 0–0.1)
Basophils Relative: 0 %
Eosinophils Absolute: 0.2 10*3/uL (ref 0–0.7)
Eosinophils Relative: 2 %
Lymphocytes Relative: 8 %
Lymphs Abs: 0.7 10*3/uL — ABNORMAL LOW (ref 1.0–3.6)
Monocytes Absolute: 0.8 10*3/uL (ref 0.2–1.0)
Monocytes Relative: 9 %
NEUTROS ABS: 6.6 10*3/uL — AB (ref 1.4–6.5)
NEUTROS PCT: 81 %

## 2016-04-22 LAB — URINALYSIS, COMPLETE (UACMP) WITH MICROSCOPIC
Bacteria, UA: NONE SEEN
Bilirubin Urine: NEGATIVE
Glucose, UA: NEGATIVE mg/dL
Hgb urine dipstick: NEGATIVE
KETONES UR: NEGATIVE mg/dL
Leukocytes, UA: NEGATIVE
Nitrite: NEGATIVE
PH: 5 (ref 5.0–8.0)
PROTEIN: NEGATIVE mg/dL
SQUAMOUS EPITHELIAL / LPF: NONE SEEN
Specific Gravity, Urine: 1.043 — ABNORMAL HIGH (ref 1.005–1.030)

## 2016-04-22 LAB — PROTIME-INR
INR: 2.18
Prothrombin Time: 24.6 seconds — ABNORMAL HIGH (ref 11.4–15.2)

## 2016-04-22 LAB — HEPATIC FUNCTION PANEL
ALBUMIN: 3.2 g/dL — AB (ref 3.5–5.0)
ALT: 42 U/L (ref 17–63)
AST: 55 U/L — ABNORMAL HIGH (ref 15–41)
Alkaline Phosphatase: 83 U/L (ref 38–126)
Bilirubin, Direct: 0.3 mg/dL (ref 0.1–0.5)
Indirect Bilirubin: 0.9 mg/dL (ref 0.3–0.9)
TOTAL PROTEIN: 7.5 g/dL (ref 6.5–8.1)
Total Bilirubin: 1.2 mg/dL (ref 0.3–1.2)

## 2016-04-22 LAB — TROPONIN I

## 2016-04-22 LAB — BASIC METABOLIC PANEL
Anion gap: 10 (ref 5–15)
BUN: 26 mg/dL — ABNORMAL HIGH (ref 6–20)
CALCIUM: 8.4 mg/dL — AB (ref 8.9–10.3)
CO2: 23 mmol/L (ref 22–32)
CREATININE: 1.6 mg/dL — AB (ref 0.61–1.24)
Chloride: 101 mmol/L (ref 101–111)
GFR, EST AFRICAN AMERICAN: 49 mL/min — AB (ref >60)
GFR, EST NON AFRICAN AMERICAN: 42 mL/min — AB (ref >60)
Glucose, Bld: 119 mg/dL — ABNORMAL HIGH (ref 65–99)
Potassium: 4.8 mmol/L (ref 3.5–5.1)
SODIUM: 134 mmol/L — AB (ref 135–145)

## 2016-04-22 LAB — APTT: APTT: 46 s — AB (ref 24–36)

## 2016-04-22 LAB — LACTIC ACID, PLASMA: Lactic Acid, Venous: 1.4 mmol/L (ref 0.5–1.9)

## 2016-04-22 LAB — BRAIN NATRIURETIC PEPTIDE: B NATRIURETIC PEPTIDE 5: 340 pg/mL — AB (ref 0.0–100.0)

## 2016-04-22 MED ORDER — IOPAMIDOL (ISOVUE-370) INJECTION 76%
60.0000 mL | Freq: Once | INTRAVENOUS | Status: AC | PRN
  Administered 2016-04-22: 60 mL via INTRAVENOUS

## 2016-04-22 MED ORDER — CEFTRIAXONE SODIUM-DEXTROSE 1-3.74 GM-% IV SOLR
INTRAVENOUS | Status: AC
  Filled 2016-04-22: qty 50

## 2016-04-22 MED ORDER — CEFTRIAXONE SODIUM 1 G IJ SOLR: 1.0000 g | Freq: Once | INTRAMUSCULAR | Status: DC

## 2016-04-22 MED ORDER — DEXTROSE 5 % IV SOLN
500.0000 mg | Freq: Once | INTRAVENOUS | Status: AC
  Administered 2016-04-22: 500 mg via INTRAVENOUS
  Filled 2016-04-22: qty 500

## 2016-04-22 MED ORDER — SODIUM CHLORIDE 0.9 % IV SOLN
Freq: Once | INTRAVENOUS | Status: AC
  Administered 2016-04-22: 22:00:00 via INTRAVENOUS

## 2016-04-22 MED ORDER — ACETAMINOPHEN 500 MG PO TABS
1000.0000 mg | ORAL_TABLET | Freq: Once | ORAL | Status: AC
  Administered 2016-04-22: 1000 mg via ORAL
  Filled 2016-04-22: qty 2

## 2016-04-22 MED ORDER — CEFTRIAXONE SODIUM-DEXTROSE 1-3.74 GM-% IV SOLR
1.0000 g | Freq: Once | INTRAVENOUS | Status: AC
  Administered 2016-04-22: 1 g via INTRAVENOUS

## 2016-04-22 NOTE — ED Triage Notes (Signed)
AAOx3.  Arrives c/o 2 week history of shortness of breath, hemoptysis, and chest pain.

## 2016-04-22 NOTE — ED Notes (Signed)
Pt with CT. 

## 2016-04-22 NOTE — ED Notes (Signed)
Pt transported to xray 

## 2016-04-22 NOTE — ED Provider Notes (Signed)
[facility] Emergency Department Provider Note   ____________________________________________   First MD Initiated Contact with Patient 04/22/16 2001     (approximate)  I have reviewed the triage vital signs and the nursing notes.   HISTORY  Chief Complaint Chest Pain; Shortness of Breath; and Hemoptysis    HPI Alan Stafford is a 71 y.o. male patient reports some chest pain worse with deep breathing and movement shortness of breath and coughing up quarter size clots of blood occasionally from last several days. He's also been feeling cold. Shortness of breath has been getting worse. He has been sweating today. Shortness breath is moderate to severe the pain is moderate. He gets worse with exertion.   Past Medical History:  Diagnosis Date  . A-fib ([place])   . Abnormal heart rhythm   . Arthritis   . Asthma   . Diabetes mellitus without complication ([place])   . GERD (gastroesophageal reflux disease)   . Hypercholesteremia   . Hypertension   . Hyperthyroidism   . Kidney stones     Patient Active Problem List   Diagnosis Date Noted  . Diabetes mellitus ([place]) 08/17/2015  . Cervical facet syndrome (Bilateral) (R>L) 08/17/2015  . Osteoarthritis of knee (Left) 08/17/2015  . Osteoarthritis of hip (Left) 08/17/2015  . Chronic low back pain (Location of Secondary source of pain) (Bilateral) (R>L) 08/17/2015  . Encounter for therapeutic drug level monitoring 01/17/2015  . Encounter for chronic pain management 01/17/2015  . Chronic pain 01/03/2015  . Lumbar spondylosis 01/03/2015  . CAD in native artery 01/03/2015  . Acid reflux 01/03/2015  . Cardiac murmur 01/03/2015  . Hypertriglyceridemia 01/03/2015  . Angina pectoris ([place]) 01/03/2015  . Long term current use of anticoagulant therapy (Coumadin) 01/03/2015  . Bulging lumbar disc (T12-L1, L1-2, L2-3, and L4-5) 01/03/2015  . Lumbar foraminal stenosis (severe right L4-5) 01/03/2015  . Chronic lower  extremity pain (Location of Primary Source of Pain) (Bilateral) (R>L) 01/03/2015  . Chronic hip pain (Right) 01/03/2015  . Chronic lumbar radicular pain (Right) 01/03/2015  . Myofascial pain syndrome (right suprascapular muscle) 01/03/2015  . Chronic neck pain (Bilateral) (R>L) 01/03/2015  . Chronic shoulder pain (Right) 01/03/2015  . Cervical spondylosis 01/03/2015  . Chronic cervical radicular pain (Right) 01/03/2015  . Chronic knee pain (Location of Tertiary source of pain) (Left) 01/03/2015  . Long term current use of opiate analgesic 01/03/2015  . Long term prescription opiate use 01/03/2015  . Opiate use 01/03/2015  . Thoracic compression fracture (HCC) (T3, T4, and T8) 01/03/2015  . Cervical facet hypertrophy (Bilateral C3-4) 01/03/2015  . Cervical nerve root disorder 01/03/2015  . Compression fracture of thoracic vertebra (HCC) 01/03/2015  . Atrial fibrillation ([place]) 04/21/2014  . Hypotestosteronism 10/05/2013  . Encounter for screening for other disorder 07/05/2013  . Drug intolerance 07/05/2013  . Chronic kidney disease (CKD), stage III (moderate) 12/31/2012  . Diabetic nephropathy ([place]) 12/31/2012  . Airway hyperreactivity 09/30/2012  . Hyperthyroidism 09/30/2012  . Essential (primary) hypertension 09/25/2012  . Hypercholesterolemia 09/25/2012  . Paroxysmal atrial fibrillation ([place]) 09/25/2012  . Type 2 diabetes mellitus ([place]) 09/25/2012    Past Surgical History:  Procedure Laterality Date  . KNEE SURGERY Right     Prior to Admission medications   Medication Sig Start Date End Date Taking? Authorizing Provider  albuterol (PROVENTIL HFA;VENTOLIN HFA) 108 (90 Base) MCG/ACT inhaler Inhale 1 puff into the lungs every 6 (six) hours as needed for wheezing or shortness of breath.  Yes Historical Provider, MD  Cholecalciferol (VITAMIN D3) 1000 units CAPS Take 1,000 Units by mouth daily.    Yes Historical Provider, MD  clotrimazole-betamethasone (LOTRISONE) cream Apply to  affected area 2 times daily 06/26/15 06/22/16 Yes Historical Provider, MD  enalapril (VASOTEC) 10 MG tablet Take 5 mg by mouth daily.   Yes Historical Provider, MD  escitalopram (LEXAPRO) 20 MG tablet Take 20 mg by mouth. 04/18/15  Yes Historical Provider, MD  flecainide (TAMBOCOR) 100 MG tablet Take 100 mg by mouth 2 (two) times daily.   Yes Historical Provider, MD  HYDROcodone-acetaminophen (NORCO) 10-325 MG tablet Take 2 tablets by mouth every 6 (six) hours as needed for pain.   Yes Historical Provider, MD  linaclotide (LINZESS) 145 MCG CAPS capsule Take 145 mcg by mouth daily.   Yes Historical Provider, MD  metFORMIN (GLUCOPHAGE) 500 MG tablet Take 500 mg by mouth 3 (three) times daily with meals.    Yes Historical Provider, MD  methimazole (TAPAZOLE) 10 MG tablet Take 10 mg by mouth 3 (three) times daily.   Yes Historical Provider, MD  metoprolol succinate (TOPROL-XL) 25 MG 24 hr tablet Take 25 mg by mouth daily.   Yes Historical Provider, MD  montelukast (SINGULAIR) 10 MG tablet Take 10 mg by mouth at bedtime.    Yes Historical Provider, MD  nitroGLYCERIN (NITROSTAT) 0.4 MG SL tablet Place 0.4 mg under the tongue every 5 (five) minutes as needed for chest pain.    Yes Historical Provider, MD  omeprazole (PRILOSEC) 20 MG capsule Take 20 mg by mouth daily.    Yes Historical Provider, MD  pregabalin (LYRICA) 100 MG capsule Take 100 mg by mouth 2 (two) times daily.   Yes Historical Provider, MD  sildenafil (REVATIO) 20 MG tablet TAKE ONE TO TWO TABLETS BY MOUTH ONCE DAILY AS NEEDED *DO NOT TAKE NITROGLYCERIN AFTERWARD* 03/14/16  Yes Historical Provider, MD  SYMBICORT 160-4.5 MCG/ACT inhaler Inhale 2 puffs into the lungs.  03/05/16  Yes Historical Provider, MD  testosterone cypionate (DEPOTESTOSTERONE CYPIONATE) 200 MG/ML injection Inject 400 mg into the muscle every 14 (fourteen) days.    Yes Historical Provider, MD  traMADol (ULTRAM) 50 MG tablet Take 100 mg by mouth every 6 (six) hours as needed for  moderate pain or severe pain.    Yes Historical Provider, MD  warfarin (COUMADIN) 5 MG tablet Take 5 mg by mouth daily.   Yes Historical Provider, MD  albuterol (PROVENTIL HFA;VENTOLIN HFA) 108 (90 Base) MCG/ACT inhaler Inhale into the lungs. 04/07/15 04/06/16  Historical Provider, MD  aspirin EC 81 MG tablet Take 81 mg by mouth. 01/01/11   Historical Provider, MD  Blood Glucose Monitoring Suppl (FIFTY50 GLUCOSE METER 2.0) w/Device KIT 1 each by Other route once. Use as instructed to check fasting BS daily.   Glucometer ; One-Touch    Historical Provider, MD  glucose blood (ACCU-CHEK COMFORT CURVE) test strip  12/27/09   Historical Provider, MD  glucose blood (ONE TOUCH ULTRA TEST) test strip USE AS DIRECTED 07/20/13   Historical Provider, MD  lactulose (CHRONULAC) 10 GM/15ML solution Take by mouth. 10/07/13   Historical Provider, MD  lactulose (CHRONULAC) 10 GM/15ML solution Take by mouth. 10/07/13   Historical Provider, MD  magnesium oxide (MAG-OX) 400 MG tablet Take 400 mg by mouth 2 (two) times daily.    Historical Provider, MD    Allergies Statins and Sulfa antibiotics  Family History  Problem Relation Age of Onset  . Dementia Mother   . Heart  disease Father     Social History Social History  Substance Use Topics  . Smoking status: Never Smoker  . Smokeless tobacco: Never Used  . Alcohol use No    Review of Systems Constitutional: chills Eyes: No visual changes. ENT: No sore throat. Cardiovascular: Denies chest pain. Respiratory:  shortness of breath. Gastrointestinal: No abdominal pain.  No nausea, no vomiting.  No diarrhea.  No constipation. Genitourinary: Negative for dysuria.. Skin: Negative for rash. Neurological: Negative for headaches, focal weakness or numbness.  10-point ROS otherwise negative.  ____________________________________________   PHYSICAL EXAM:  VITAL SIGNS: ED Triage Vitals  Enc Vitals Group     BP 04/22/16 1947 (!) 135/100     Pulse Rate  04/22/16 1947 (!) 120     Resp 04/22/16 1947 (!) 22     Temp 04/22/16 2007 (!) 101.4 F (38.6 C)     Temp src --      SpO2 04/22/16 2007 95 %     Weight 04/22/16 1939 220 lb (99.8 kg)     Height 04/22/16 1939 _0  (1.88 m)     Head Circumference --      Peak Flow --      Pain Score 04/22/16 1939 0     Pain Loc --      Pain Edu? --      Excl. in [place]? --     Constitutional: Alert and oriented. Ill appearing But in no acute distress. Eyes: Conjunctivae are normal. PERRL. EOMI. Head: Atraumatic. Nose: No congestion/rhinnorhea. Mouth/Throat: Mucous membranes are moist.  Oropharynx non-erythematous. Neck: No stridor.   Cardiovascular: Normal rate, regular rhythm. Grossly normal heart sounds.  Good peripheral circulation. Respiratory: Normal respiratory effort.  No retractions. Lungs scattered crackles Gastrointestinal: Soft and nontender. No distention. No abdominal bruits. No CVA tenderness. Musculoskeletal: No lower extremity tenderness nor edema.  No joint effusions. Neurologic:  Normal speech and language. No gross focal neurologic deficits are appreciated. No gait instability. Skin:  Skin is warm, dry and intact. No rash noted.   ____________________________________________   LABS (all labs ordered are listed, but only abnormal results are displayed)  Labs Reviewed  BASIC METABOLIC PANEL - Abnormal; Notable for the following:       Result Value   Sodium 134 (*)    Glucose, Bld 119 (*)    BUN 26 (*)    Creatinine, Ser 1.60 (*)    Calcium 8.4 (*)    GFR calc non Af Amer 42 (*)    GFR calc Af Amer 49 (*)    All other components within normal limits  CBC - Abnormal; Notable for the following:    Hemoglobin 11.4 (*)    HCT 36.6 (*)    MCV 74.7 (*)    MCH 23.3 (*)    MCHC 31.2 (*)    RDW 18.6 (*)    All other components within normal limits  PROTIME-INR - Abnormal; Notable for the following:    Prothrombin Time 24.6 (*)    All other components within normal limits    APTT - Abnormal; Notable for the following:    aPTT 46 (*)    All other components within normal limits  HEPATIC FUNCTION PANEL - Abnormal; Notable for the following:    Albumin 3.2 (*)    AST 55 (*)    All other components within normal limits  BRAIN NATRIURETIC PEPTIDE - Abnormal; Notable for the following:    B Natriuretic Peptide 340.0 (*)  All other components within normal limits  DIFFERENTIAL - Abnormal; Notable for the following:    Neutro Abs 6.6 (*)    Lymphs Abs 0.7 (*)    All other components within normal limits  CULTURE, BLOOD (ROUTINE X 2)  CULTURE, BLOOD (ROUTINE X 2)  TROPONIN I  LACTIC ACID, PLASMA  URINALYSIS, COMPLETE (UACMP) WITH MICROSCOPIC   ____________________________________________  EKG EKG read and interpreted by me shows A. fib at a rate of 126 left axis nonspecific ST-T wave changes ____________________________________________  RADIOLOGY  Study Result   CLINICAL DATA:  Chest pain, shortness of breath, and hemoptysis.  EXAM: CHEST  2 VIEW  COMPARISON:  03/02/2010  FINDINGS: The patient is mildly rotated to the right. There is new enlargement of the cardiac silhouette. There are new relatively diffuse interstitial opacities bilaterally, greatest in the lung bases and slightly greater on the right than on the left. No sizable pleural effusion is identified. No acute osseous abnormality is seen.  IMPRESSION: New cardiomegaly and bilateral interstitial opacities which may reflect edema, atypical infection, or other interstitial lung disease.   Electronically Signed   By: Sebastian Ache M.D.   On: 04/22/2016 20:13    Study Result   CLINICAL DATA:  Subacute onset of shortness of breath, hemoptysis and generalized chest pain. Initial encounter.  EXAM: CT ANGIOGRAPHY CHEST WITH CONTRAST  TECHNIQUE: Multidetector CT imaging of the chest was performed using the standard protocol during bolus administration of  intravenous contrast. Multiplanar CT image reconstructions and MIPs were obtained to evaluate the vascular anatomy.  CONTRAST:  60 mL of Isovue 370 IV contrast  COMPARISON:  Chest radiograph performed earlier today at 7:50 p.m.  FINDINGS: Cardiovascular:  There is no evidence of pulmonary embolus.  The heart is mildly enlarged. The thoracic aorta is grossly unremarkable, aside from minimal calcification. The great vessels are unremarkable in appearance.  Mediastinum/Nodes: A small hiatal hernia is seen. Enlarged mediastinal and left hilar nodes are seen, measuring up to 1.8 cm at the azygoesophageal recess and 1.4 cm at the right paratracheal region, 1.3 cm at the periaortic region and 1.3 cm at the left hilum.  Trace pericardial fluid remains within normal limits. The thyroid gland is unremarkable in appearance. No axillary lymphadenopathy is seen.  Lungs/Pleura: Multiple areas of ground-glass opacification and additional more dense airspace opacities are seen throughout both lungs, with trace bilateral pleural effusions. This more likely reflects pneumonia or pulmonary edema, though inflammatory or other processes could have a similar appearance, given the underlying lymphadenopathy. No pneumothorax is seen. Mild interstitial prominence is noted.  Upper Abdomen: The visualized portions of the liver and spleen are grossly unremarkable, aside from scattered calcified granulomata within the spleen. The visualized portions of the pancreas, adrenal glands and kidneys are grossly unremarkable.  Musculoskeletal: No acute osseous abnormalities are identified. There is mild chronic compression deformity of vertebral bodies T3 and T4. The visualized musculature is unremarkable in appearance.  Review of the MIP images confirms the above findings.  IMPRESSION: 1. No evidence of pulmonary embolus. 2. Multiple areas of ground-glass opacification and additional more dense  airspace opacities seen throughout both lungs, with trace bilateral pleural effusions. This more likely reflects pneumonia or pulmonary edema, though inflammatory or other processes could have a similar appearance, given underlying lymphadenopathy. 3. Enlarged mediastinal and left hilar nodes, measuring up to 1.8 cm at the azygoesophageal recess and 1.3 cm at the left hilum. 4. Mild cardiomegaly. 5. Small hiatal hernia seen. 6. Mild chronic compression  deformity of vertebral bodies T3 and T4.   Electronically Signed   By: Garald Balding M.D.   On: 04/22/2016 22:23     ____________________________________________   PROCEDURES  Procedure(s) performed:  Procedures  Critical Care performed:   ____________________________________________   INITIAL IMPRESSION / ASSESSMENT AND PLAN / ED COURSE  Pertinent labs & imaging results that were available during my care of the patient were reviewed by me and considered in my medical decision making (see chart for details).        ____________________________________________   FINAL CLINICAL IMPRESSION(S) / ED DIAGNOSES  Final diagnoses:  Community acquired pneumonia, unspecified laterality      NEW MEDICATIONS STARTED DURING THIS VISIT:  New Prescriptions   No medications on file     Note:  This document was prepared using Dragon voice recognition software and may include unintentional dictation errors.    Nena Polio, MD 04/22/16 2245

## 2016-04-22 NOTE — H&P (Signed)
[place] [facility] [place] NAME: Alan Stafford    MR#:  [id number]  DATE OF BIRTH:  08-30-1945  DATE OF ADMISSION:  04/22/2016  PRIMARY CARE PHYSICIAN: Raelene Bott, MD   REQUESTING/REFERRING PHYSICIAN: Cinda Quest, MD  CHIEF COMPLAINT:   Chief Complaint  Patient presents with  . Chest Pain  . Shortness of Breath  . Hemoptysis    HISTORY OF PRESENT ILLNESS:  Alan Stafford  is a 71 y.o. male who presents with Cough, fever and chills, shortness of breath. Patient states that for the past week or so he's been having increasing cough and some progressive shortness of breath. Over the last 24 hours he had significant fever and chills as well as some hemoptysis. Here in the ED he was found to have significant bilateral pneumonia, and met sepsis criteria. Hospitalists were called for admission.  PAST MEDICAL HISTORY:   Past Medical History:  Diagnosis Date  . A-fib ([place])   . Abnormal heart rhythm   . Arthritis   . Asthma   . Diabetes mellitus without complication ([place])   . GERD (gastroesophageal reflux disease)   . Hypercholesteremia   . Hypertension   . Hyperthyroidism   . Kidney stones     PAST SURGICAL HISTORY:   Past Surgical History:  Procedure Laterality Date  . KNEE SURGERY Right     SOCIAL HISTORY:   Social History  Substance Use Topics  . Smoking status: Never Smoker  . Smokeless tobacco: Never Used  . Alcohol use No    FAMILY HISTORY:   Family History  Problem Relation Age of Onset  . Dementia Mother   . Heart disease Father     DRUG ALLERGIES:   Allergies  Allergen Reactions  . Statins Other (See Comments)    Leg weakness  . Sulfa Antibiotics Rash    MEDICATIONS AT HOME:   Prior to Admission medications   Medication Sig Start Date End Date Taking? Authorizing Provider  albuterol (PROVENTIL HFA;VENTOLIN HFA) 108 (90 Base) MCG/ACT inhaler Inhale 1 puff into the lungs every 6 (six) hours as needed for  wheezing or shortness of breath.    Yes Historical Provider, MD  Cholecalciferol (VITAMIN D3) 1000 units CAPS Take 1,000 Units by mouth daily.    Yes Historical Provider, MD  clotrimazole-betamethasone (LOTRISONE) cream Apply to affected area 2 times daily 06/26/15 06/22/16 Yes Historical Provider, MD  enalapril (VASOTEC) 10 MG tablet Take 5 mg by mouth daily.   Yes Historical Provider, MD  escitalopram (LEXAPRO) 20 MG tablet Take 20 mg by mouth. 04/18/15  Yes Historical Provider, MD  flecainide (TAMBOCOR) 100 MG tablet Take 100 mg by mouth 2 (two) times daily.   Yes Historical Provider, MD  HYDROcodone-acetaminophen (NORCO) 10-325 MG tablet Take 2 tablets by mouth every 6 (six) hours as needed for pain.   Yes Historical Provider, MD  linaclotide (LINZESS) 145 MCG CAPS capsule Take 145 mcg by mouth daily.   Yes Historical Provider, MD  metFORMIN (GLUCOPHAGE) 500 MG tablet Take 500 mg by mouth 3 (three) times daily with meals.    Yes Historical Provider, MD  methimazole (TAPAZOLE) 10 MG tablet Take 10 mg by mouth 3 (three) times daily.   Yes Historical Provider, MD  metoprolol succinate (TOPROL-XL) 25 MG 24 hr tablet Take 25 mg by mouth daily.   Yes Historical Provider, MD  montelukast (SINGULAIR) 10 MG tablet Take 10 mg by mouth at bedtime.    Yes Historical Provider, MD  nitroGLYCERIN (NITROSTAT) 0.4 MG SL tablet Place 0.4 mg under the tongue every 5 (five) minutes as needed for chest pain.    Yes Historical Provider, MD  omeprazole (PRILOSEC) 20 MG capsule Take 20 mg by mouth daily.    Yes Historical Provider, MD  pregabalin (LYRICA) 100 MG capsule Take 100 mg by mouth 2 (two) times daily.   Yes Historical Provider, MD  sildenafil (REVATIO) 20 MG tablet TAKE ONE TO TWO TABLETS BY MOUTH ONCE DAILY AS NEEDED *DO NOT TAKE NITROGLYCERIN AFTERWARD* 03/14/16  Yes Historical Provider, MD  SYMBICORT 160-4.5 MCG/ACT inhaler Inhale 2 puffs into the lungs.  03/05/16  Yes Historical Provider, MD  testosterone  cypionate (DEPOTESTOSTERONE CYPIONATE) 200 MG/ML injection Inject 400 mg into the muscle every 14 (fourteen) days.    Yes Historical Provider, MD  traMADol (ULTRAM) 50 MG tablet Take 100 mg by mouth every 6 (six) hours as needed for moderate pain or severe pain.    Yes Historical Provider, MD  warfarin (COUMADIN) 5 MG tablet Take 5 mg by mouth daily.   Yes Historical Provider, MD  albuterol (PROVENTIL HFA;VENTOLIN HFA) 108 (90 Base) MCG/ACT inhaler Inhale into the lungs. 04/07/15 04/06/16  Historical Provider, MD  aspirin EC 81 MG tablet Take 81 mg by mouth. 01/01/11   Historical Provider, MD  Blood Glucose Monitoring Suppl (FIFTY50 GLUCOSE METER 2.0) w/Device KIT 1 each by Other route once. Use as instructed to check fasting BS daily.   Glucometer ; One-Touch    Historical Provider, MD  glucose blood (ACCU-CHEK COMFORT CURVE) test strip  12/27/09   Historical Provider, MD  glucose blood (ONE TOUCH ULTRA TEST) test strip USE AS DIRECTED 07/20/13   Historical Provider, MD  lactulose (CHRONULAC) 10 GM/15ML solution Take by mouth. 10/07/13   Historical Provider, MD  lactulose (CHRONULAC) 10 GM/15ML solution Take by mouth. 10/07/13   Historical Provider, MD  magnesium oxide (MAG-OX) 400 MG tablet Take 400 mg by mouth 2 (two) times daily.    Historical Provider, MD    REVIEW OF SYSTEMS:  Review of Systems  Constitutional: Negative for chills, fever, malaise/fatigue and weight loss.  HENT: Negative for ear pain, hearing loss and tinnitus.   Eyes: Negative for blurred vision, double vision, pain and redness.  Respiratory: Positive for cough, hemoptysis and shortness of breath.   Cardiovascular: Positive for chest pain. Negative for palpitations, orthopnea and leg swelling.  Gastrointestinal: Negative for abdominal pain, constipation, diarrhea, nausea and vomiting.  Genitourinary: Negative for dysuria, frequency and hematuria.  Musculoskeletal: Negative for back pain, joint pain and neck pain.  Skin:        No acne, rash, or lesions  Neurological: Negative for dizziness, tremors, focal weakness and weakness.  Endo/Heme/Allergies: Negative for polydipsia. Does not bruise/bleed easily.  Psychiatric/Behavioral: Negative for depression. The patient is not nervous/anxious and does not have insomnia.      VITAL SIGNS:   Vitals:   04/22/16 2007 04/22/16 2134 04/22/16 2136 04/22/16 2230  BP:  92/75  99/70  Pulse:  (!) 102  (!) 104  Resp:  18  (!) 31  Temp: (!) 101.4 F (38.6 C)  98.7 F (37.1 C)   TempSrc:   Oral   SpO2: 95% 92%  95%  Weight:      Height:       Wt Readings from Last 3 Encounters:  04/22/16 99.8 kg (220 lb)  04/01/16 103.4 kg (228 lb)  08/17/15 101.6 kg (224 lb)    PHYSICAL EXAMINATION:  Physical Exam  Vitals reviewed. Constitutional: He is oriented to person, place, and time. He appears well-developed and well-nourished. No distress.  HENT:  Head: Normocephalic and atraumatic.  Mouth/Throat: Oropharynx is clear and moist.  Eyes: Conjunctivae and EOM are normal. Pupils are equal, round, and reactive to light. No scleral icterus.  Neck: Normal range of motion. Neck supple. No JVD present. No thyromegaly present.  Cardiovascular: Intact distal pulses.  Exam reveals no gallop and no friction rub.   No murmur heard. Mild tachycardia, irregular rhythm  Respiratory: Effort normal. No respiratory distress. He has no wheezes. He has no rales.  ronchi  GI: Soft. Bowel sounds are normal. He exhibits no distension. There is no tenderness.  Musculoskeletal: Normal range of motion. He exhibits no edema.  No arthritis, no gout  Lymphadenopathy:    He has no cervical adenopathy.  Neurological: He is alert and oriented to person, place, and time. No cranial nerve deficit.  No dysarthria, no aphasia  Skin: Skin is warm and dry. No rash noted. No erythema.  Psychiatric: He has a normal mood and affect. His behavior is normal. Judgment and thought content normal.    LABORATORY  PANEL:   CBC  Recent Labs Lab 04/22/16 2004  WBC 8.3  HGB 11.4*  HCT 36.6*  PLT 245   ------------------------------------------------------------------------------------------------------------------  Chemistries   Recent Labs Lab 04/22/16 2004  NA 134*  K 4.8  CL 101  CO2 23  GLUCOSE 119*  BUN 26*  CREATININE 1.60*  CALCIUM 8.4*  AST 55*  ALT 42  ALKPHOS 83  BILITOT 1.2   ------------------------------------------------------------------------------------------------------------------  Cardiac Enzymes  Recent Labs Lab 04/22/16 2004  TROPONINI <0.03   ------------------------------------------------------------------------------------------------------------------  RADIOLOGY:  Dg Chest 2 View  Result Date: 04/22/2016 CLINICAL DATA:  Chest pain, shortness of breath, and hemoptysis. EXAM: CHEST  2 VIEW COMPARISON:  03/02/2010 FINDINGS: The patient is mildly rotated to the right. There is new enlargement of the cardiac silhouette. There are new relatively diffuse interstitial opacities bilaterally, greatest in the lung bases and slightly greater on the right than on the left. No sizable pleural effusion is identified. No acute osseous abnormality is seen. IMPRESSION: New cardiomegaly and bilateral interstitial opacities which may reflect edema, atypical infection, or other interstitial lung disease. Electronically Signed   By: Logan Bores M.D.   On: 04/22/2016 20:13   Ct Angio Chest Pe W And/or Wo Contrast  Result Date: 04/22/2016 CLINICAL DATA:  Subacute onset of shortness of breath, hemoptysis and generalized chest pain. Initial encounter. EXAM: CT ANGIOGRAPHY CHEST WITH CONTRAST TECHNIQUE: Multidetector CT imaging of the chest was performed using the standard protocol during bolus administration of intravenous contrast. Multiplanar CT image reconstructions and MIPs were obtained to evaluate the vascular anatomy. CONTRAST:  60 mL of Isovue 370 IV contrast  COMPARISON:  Chest radiograph performed earlier today at 7:50 p.m. FINDINGS: Cardiovascular:  There is no evidence of pulmonary embolus. The heart is mildly enlarged. The thoracic aorta is grossly unremarkable, aside from minimal calcification. The great vessels are unremarkable in appearance. Mediastinum/Nodes: A small hiatal hernia is seen. Enlarged mediastinal and left hilar nodes are seen, measuring up to 1.8 cm at the azygoesophageal recess and 1.4 cm at the right paratracheal region, 1.3 cm at the periaortic region and 1.3 cm at the left hilum. Trace pericardial fluid remains within normal limits. The thyroid gland is unremarkable in appearance. No axillary lymphadenopathy is seen. Lungs/Pleura: Multiple areas of ground-glass opacification and additional more dense airspace opacities  are seen throughout both lungs, with trace bilateral pleural effusions. This more likely reflects pneumonia or pulmonary edema, though inflammatory or other processes could have a similar appearance, given the underlying lymphadenopathy. No pneumothorax is seen. Mild interstitial prominence is noted. Upper Abdomen: The visualized portions of the liver and spleen are grossly unremarkable, aside from scattered calcified granulomata within the spleen. The visualized portions of the pancreas, adrenal glands and kidneys are grossly unremarkable. Musculoskeletal: No acute osseous abnormalities are identified. There is mild chronic compression deformity of vertebral bodies T3 and T4. The visualized musculature is unremarkable in appearance. Review of the MIP images confirms the above findings. IMPRESSION: 1. No evidence of pulmonary embolus. 2. Multiple areas of ground-glass opacification and additional more dense airspace opacities seen throughout both lungs, with trace bilateral pleural effusions. This more likely reflects pneumonia or pulmonary edema, though inflammatory or other processes could have a similar appearance, given  underlying lymphadenopathy. 3. Enlarged mediastinal and left hilar nodes, measuring up to 1.8 cm at the azygoesophageal recess and 1.3 cm at the left hilum. 4. Mild cardiomegaly. 5. Small hiatal hernia seen. 6. Mild chronic compression deformity of vertebral bodies T3 and T4. Electronically Signed   By: Garald Balding M.D.   On: 04/22/2016 22:23    EKG:   Orders placed or performed during the hospital encounter of 04/22/16  . ED EKG within 10 minutes  . ED EKG within 10 minutes    IMPRESSION AND PLAN:  Principal Problem:   Sepsis ([place]) - patient is hemodynamically stable, broad-spectrum antibiotics were started in the ED and continued on admission, cultures sent from the ED, we will order sputum culture, lactic acid was within normal limits Active Problems:   CAP (community acquired pneumonia) - antibiotics and cultures as above   Hemoptysis - this is likely due to significant strenuous cough as well as his pneumonia, however we will get a pulmonology consult   CAD in native artery - continue home meds   Essential (primary) hypertension - hold home antihypertensives as patient's blood pressure is stable, but low end normotensive at this time   Type 2 diabetes mellitus (HCC) - sliding scale insulin with corresponding glucose checks   Acid reflux - home dose PPI  All the records are reviewed and case discussed with ED provider. Management plans discussed with the patient and/or family.  DVT PROPHYLAXIS: Systemic anticoagulation  GI PROPHYLAXIS: PPI  ADMISSION STATUS: Inpatient  CODE STATUS: Full Code Status History    This patient does not have a recorded code status. Please follow your organizational policy for patients in this situation.      TOTAL TIME TAKING CARE OF THIS PATIENT: 45 minutes.    Marquel Pottenger [place] 04/22/2016, 11:41 PM  [facility]  Office  [phone]  CC: Primary care physician; Raelene Bott, MD

## 2016-04-23 ENCOUNTER — Inpatient Hospital Stay: Admit: 2016-04-23 | Payer: Medicare Other

## 2016-04-23 ENCOUNTER — Inpatient Hospital Stay
Admit: 2016-04-23 | Discharge: 2016-04-23 | Disposition: A | Discharge status: Home / Self Care | Payer: Medicare Other | Attending: Internal Medicine | Admitting: Internal Medicine

## 2016-04-23 DIAGNOSIS — I5023 Acute on chronic systolic (congestive) heart failure: Secondary | ICD-10-CM | POA: Insufficient documentation

## 2016-04-23 DIAGNOSIS — I2510 Atherosclerotic heart disease of native coronary artery without angina pectoris: Secondary | ICD-10-CM

## 2016-04-23 DIAGNOSIS — I5043 Acute on chronic combined systolic (congestive) and diastolic (congestive) heart failure: Secondary | ICD-10-CM | POA: Insufficient documentation

## 2016-04-23 DIAGNOSIS — I5042 Chronic combined systolic (congestive) and diastolic (congestive) heart failure: Secondary | ICD-10-CM | POA: Insufficient documentation

## 2016-04-23 LAB — BASIC METABOLIC PANEL
Anion gap: 7 (ref 5–15)
BUN: 28 mg/dL — AB (ref 6–20)
CHLORIDE: 103 mmol/L (ref 101–111)
CO2: 22 mmol/L (ref 22–32)
CREATININE: 1.75 mg/dL — AB (ref 0.61–1.24)
Calcium: 8.1 mg/dL — ABNORMAL LOW (ref 8.9–10.3)
GFR calc Af Amer: 44 mL/min — ABNORMAL LOW (ref >60)
GFR calc non Af Amer: 38 mL/min — ABNORMAL LOW (ref >60)
Glucose, Bld: 132 mg/dL — ABNORMAL HIGH (ref 65–99)
Potassium: 4.6 mmol/L (ref 3.5–5.1)
SODIUM: 132 mmol/L — AB (ref 135–145)

## 2016-04-23 LAB — CBC
HCT: 33.8 % — ABNORMAL LOW (ref 40.0–52.0)
HEMOGLOBIN: 10.6 g/dL — AB (ref 13.0–18.0)
MCH: 23.5 pg — AB (ref 26.0–34.0)
MCHC: 31.5 g/dL — ABNORMAL LOW (ref 32.0–36.0)
MCV: 74.6 fL — AB (ref 80.0–100.0)
Platelets: 213 10*3/uL (ref 150–440)
RBC: 4.53 MIL/uL (ref 4.40–5.90)
RDW: 18.8 % — ABNORMAL HIGH (ref 11.5–14.5)
WBC: 7.5 10*3/uL (ref 3.8–10.6)

## 2016-04-23 LAB — GLUCOSE, CAPILLARY
GLUCOSE-CAPILLARY: 111 mg/dL — AB (ref 65–99)
GLUCOSE-CAPILLARY: 127 mg/dL — AB (ref 65–99)
Glucose-Capillary: 166 mg/dL — ABNORMAL HIGH (ref 65–99)
Glucose-Capillary: 186 mg/dL — ABNORMAL HIGH (ref 65–99)

## 2016-04-23 LAB — INFLUENZA PANEL BY PCR (TYPE A & B)
INFLBPCR: NEGATIVE
Influenza A By PCR: NEGATIVE

## 2016-04-23 LAB — PROCALCITONIN: PROCALCITONIN: 0.38 ng/mL

## 2016-04-23 MED ORDER — SODIUM CHLORIDE 0.9 % IV SOLN
INTRAVENOUS | Status: AC
  Administered 2016-04-23: 04:00:00 via INTRAVENOUS

## 2016-04-23 MED ORDER — INSULIN ASPART 100 UNIT/ML ~~LOC~~ SOLN
0.0000 [IU] | Freq: Three times a day (TID) | SUBCUTANEOUS | Status: DC
  Administered 2016-04-23: 1 [IU] via SUBCUTANEOUS
  Administered 2016-04-23 – 2016-04-24 (×2): 2 [IU] via SUBCUTANEOUS
  Administered 2016-04-24 – 2016-04-25 (×2): 1 [IU] via SUBCUTANEOUS
  Filled 2016-04-23: qty 2
  Filled 2016-04-23 (×2): qty 1
  Filled 2016-04-23: qty 2
  Filled 2016-04-23: qty 1

## 2016-04-23 MED ORDER — INSULIN ASPART 100 UNIT/ML ~~LOC~~ SOLN
0.0000 [IU] | Freq: Every day | SUBCUTANEOUS | Status: DC
  Administered 2016-04-24: 3 [IU] via SUBCUTANEOUS
  Filled 2016-04-23: qty 3

## 2016-04-23 MED ORDER — ESCITALOPRAM OXALATE 10 MG PO TABS
20.0000 mg | ORAL_TABLET | Freq: Every day | ORAL | Status: DC
  Administered 2016-04-23 – 2016-04-25 (×3): 20 mg via ORAL
  Filled 2016-04-23 (×3): qty 2

## 2016-04-23 MED ORDER — METHIMAZOLE 10 MG PO TABS
10.0000 mg | ORAL_TABLET | Freq: Three times a day (TID) | ORAL | Status: DC
  Administered 2016-04-23 – 2016-04-25 (×7): 10 mg via ORAL
  Filled 2016-04-23 (×7): qty 1

## 2016-04-23 MED ORDER — GUAIFENESIN-DM 100-10 MG/5ML PO SYRP
5.0000 mL | ORAL_SOLUTION | ORAL | Status: DC | PRN
  Administered 2016-04-23 (×2): 5 mL via ORAL
  Filled 2016-04-23 (×2): qty 5

## 2016-04-23 MED ORDER — PANTOPRAZOLE SODIUM 40 MG PO TBEC
40.0000 mg | DELAYED_RELEASE_TABLET | Freq: Every day | ORAL | Status: DC
  Administered 2016-04-23 – 2016-04-25 (×3): 40 mg via ORAL
  Filled 2016-04-23 (×3): qty 1

## 2016-04-23 MED ORDER — FLECAINIDE ACETATE 50 MG PO TABS
100.0000 mg | ORAL_TABLET | Freq: Two times a day (BID) | ORAL | Status: DC
  Administered 2016-04-23 – 2016-04-25 (×5): 100 mg via ORAL
  Filled 2016-04-23 (×4): qty 2

## 2016-04-23 MED ORDER — PERFLUTREN LIPID MICROSPHERE
1.0000 mL | INTRAVENOUS | Status: AC | PRN
  Administered 2016-04-23: 2 mL via INTRAVENOUS
  Filled 2016-04-23: qty 10

## 2016-04-23 MED ORDER — PREGABALIN 50 MG PO CAPS
100.0000 mg | ORAL_CAPSULE | Freq: Two times a day (BID) | ORAL | Status: DC
  Administered 2016-04-23 – 2016-04-25 (×4): 100 mg via ORAL
  Filled 2016-04-23 (×5): qty 2

## 2016-04-23 MED ORDER — ASPIRIN EC 81 MG PO TBEC
81.0000 mg | DELAYED_RELEASE_TABLET | Freq: Every day | ORAL | Status: DC
  Administered 2016-04-23 – 2016-04-25 (×3): 81 mg via ORAL
  Filled 2016-04-23 (×3): qty 1

## 2016-04-23 MED ORDER — MAGNESIUM OXIDE 400 (241.3 MG) MG PO TABS
400.0000 mg | ORAL_TABLET | Freq: Two times a day (BID) | ORAL | Status: DC
  Administered 2016-04-23 – 2016-04-25 (×5): 400 mg via ORAL
  Filled 2016-04-23 (×4): qty 1

## 2016-04-23 MED ORDER — WARFARIN - PHARMACIST DOSING INPATIENT: Freq: Every day | Status: DC

## 2016-04-23 MED ORDER — SODIUM CHLORIDE 0.9 % IV SOLN
1250.0000 mg | INTRAVENOUS | Status: DC
  Filled 2016-04-23: qty 1250

## 2016-04-23 MED ORDER — MONTELUKAST SODIUM 10 MG PO TABS
10.0000 mg | ORAL_TABLET | Freq: Every day | ORAL | Status: DC
  Administered 2016-04-23 – 2016-04-24 (×2): 10 mg via ORAL
  Filled 2016-04-23: qty 1

## 2016-04-23 MED ORDER — VANCOMYCIN HCL 10 G IV SOLR
1250.0000 mg | Freq: Once | INTRAVENOUS | Status: AC
  Administered 2016-04-23: 1250 mg via INTRAVENOUS
  Filled 2016-04-23: qty 1250

## 2016-04-23 MED ORDER — ACETAMINOPHEN 650 MG RE SUPP: 650.0000 mg | Freq: Four times a day (QID) | RECTAL | Status: DC | PRN

## 2016-04-23 MED ORDER — PIPERACILLIN-TAZOBACTAM 3.375 G IVPB
3.3750 g | Freq: Three times a day (TID) | INTRAVENOUS | Status: DC
  Administered 2016-04-23 – 2016-04-24 (×5): 3.375 g via INTRAVENOUS
  Filled 2016-04-23 (×5): qty 50

## 2016-04-23 MED ORDER — METOPROLOL SUCCINATE ER 25 MG PO TB24
25.0000 mg | ORAL_TABLET | Freq: Every day | ORAL | Status: DC
  Administered 2016-04-23 – 2016-04-25 (×3): 25 mg via ORAL
  Filled 2016-04-23 (×3): qty 1

## 2016-04-23 MED ORDER — IPRATROPIUM-ALBUTEROL 0.5-2.5 (3) MG/3ML IN SOLN: 3.0000 mL | RESPIRATORY_TRACT | Status: DC | PRN

## 2016-04-23 MED ORDER — HYDROCODONE-ACETAMINOPHEN 10-325 MG PO TABS
2.0000 | ORAL_TABLET | Freq: Four times a day (QID) | ORAL | Status: DC | PRN
  Administered 2016-04-23 – 2016-04-25 (×5): 2 via ORAL
  Filled 2016-04-23 (×5): qty 2

## 2016-04-23 MED ORDER — MOMETASONE FURO-FORMOTEROL FUM 200-5 MCG/ACT IN AERO
2.0000 | INHALATION_SPRAY | Freq: Two times a day (BID) | RESPIRATORY_TRACT | Status: DC
  Administered 2016-04-23 – 2016-04-25 (×4): 2 via RESPIRATORY_TRACT
  Filled 2016-04-23: qty 8.8

## 2016-04-23 MED ORDER — ACETAMINOPHEN 325 MG PO TABS
650.0000 mg | ORAL_TABLET | Freq: Four times a day (QID) | ORAL | Status: DC | PRN
  Administered 2016-04-23 – 2016-04-24 (×5): 650 mg via ORAL
  Filled 2016-04-23 (×5): qty 2

## 2016-04-23 MED ORDER — BENZONATATE 100 MG PO CAPS
200.0000 mg | ORAL_CAPSULE | Freq: Three times a day (TID) | ORAL | Status: DC | PRN
  Filled 2016-04-23: qty 2

## 2016-04-23 MED ORDER — ONDANSETRON HCL 4 MG PO TABS: 4.0000 mg | ORAL_TABLET | Freq: Four times a day (QID) | ORAL | Status: DC | PRN

## 2016-04-23 MED ORDER — WARFARIN SODIUM 5 MG PO TABS: 5.0000 mg | ORAL_TABLET | Freq: Every day | ORAL | Status: DC

## 2016-04-23 MED ORDER — ONDANSETRON HCL 4 MG/2ML IJ SOLN: 4.0000 mg | Freq: Four times a day (QID) | INTRAMUSCULAR | Status: DC | PRN

## 2016-04-23 NOTE — Progress Notes (Signed)
ANTICOAGULATION CONSULT NOTE - Initial Consult  Pharmacy Consult for warfarin Indication: AF  Allergies  Allergen Reactions  . Statins Other (See Comments)    Leg weakness  . Sulfa Antibiotics Rash    Patient Measurements: Height: 6\' 2"  (188 cm) Weight: 220 lb (99.8 kg) IBW/kg (Calculated) : 82.2 Heparin Dosing Weight:   Vital Signs: Temp: 98 F (36.7 C) (02/27 0248) Temp Source: Oral (02/27 0248) BP: 103/71 (02/27 0248) Pulse Rate: 91 (02/27 0248)  Labs:  Recent Labs  04/22/16 2004  HGB 11.4*  HCT 36.6*  PLT 245  APTT 46*  LABPROT 24.6*  INR 2.18  CREATININE 1.60*  TROPONINI <0.03    Estimated Creatinine Clearance: 54.2 mL/min (by C-G formula based on SCr of 1.6 mg/dL (H)).   Medical History: Past Medical History:  Diagnosis Date  . A-fib ([facility])   . Abnormal heart rhythm   . Arthritis   . Asthma   . Diabetes mellitus without complication ([facility])   . GERD (gastroesophageal reflux disease)   . Hypercholesteremia   . Hypertension   . Hyperthyroidism   . Kidney stones     Medications:  Infusions:  . sodium chloride      Assessment: 70 yom cc CP/SOB/hemoptysis. Increasing cough and progressive SOB x 1 week, over last 24 hours patient had fever and chills as well as some hemoptysis. ED found bilateral pneumonia with sepsis criteria. Pharmacy consulted to dose ABX for PNA and to continue VKA for AF from home.  Goal of Therapy:  INR 2-3 Monitor platelets by anticoagulation protocol: Yes   Plan:  INR therapeutic on admission. Continue warfarin 5 mg po once daily. Will follow INR daily while on ABX and during drug regimen changes and adjust as necessary to maintain INR 2 to 3.  Carola FrostNathan A Emrey Thornley, Pharm.D., BCPS Clinical Pharmacist 04/23/2016,3:13 AM

## 2016-04-23 NOTE — Progress Notes (Signed)
Pharmacy Antibiotic Note  Alan BlonderSteve D Nazaire is a 71 y.o. male admitted on 04/22/2016 with sepsis.  Pharmacy has been consulted for Zosyn and vancomycin dosing.  Plan: 1. Zosyn 3.375 gm IV Q8H EI 2. Vancomycin 1.25 gm IV x 1 followed in approximately 8 hours (stacked dosing) by vancomycin 1.25 gm IV Q18H, predicted trough 15 mcg/mL. Pharmacy will continue to follow and adjust as needed to maintain trough 15 to 20 mcg/mL.   Vd 62.5 L, Ke 0.049 hr-1, T1/2 14 hr  Height: 6\' 2"  (188 cm) Weight: 220 lb (99.8 kg) IBW/kg (Calculated) : 82.2  Temp (24hrs), Avg:99.4 F (37.4 C), Min:98 F (36.7 C), Max:101.4 F (38.6 C)   Recent Labs Lab 04/22/16 2004  WBC 8.3  CREATININE 1.60*  LATICACIDVEN 1.4    Estimated Creatinine Clearance: 54.2 mL/min (by C-G formula based on SCr of 1.6 mg/dL (H)).    Allergies  Allergen Reactions  . Statins Other (See Comments)    Leg weakness  . Sulfa Antibiotics Rash    Thank you for allowing pharmacy to be a part of this patient's care.  Carola FrostNathan A Gabriellah Rabel, Pharm.D., BCPS Clinical Pharmacist 04/23/2016 3:04 AM

## 2016-04-23 NOTE — Progress Notes (Signed)
ANTICOAGULATION CONSULT NOTE - Initial Consult  Pharmacy Consult for warfarin Indication: AF  Allergies  Allergen Reactions  . Statins Other (See Comments)    Leg weakness  . Sulfa Antibiotics Rash    Patient Measurements: Height: 6\' 2"  (188 cm) Weight: 220 lb (99.8 kg) IBW/kg (Calculated) : 82.2 Heparin Dosing Weight:   Vital Signs: Temp: 98.2 F (36.8 C) (02/27 1146) Temp Source: Oral (02/27 1146) BP: 105/71 (02/27 1146) Pulse Rate: 95 (02/27 1146)  Labs:  Recent Labs  04/22/16 2004 04/23/16 0259  HGB 11.4* 10.6*  HCT 36.6* 33.8*  PLT 245 213  APTT 46*  --   LABPROT 24.6*  --   INR 2.18  --   CREATININE 1.60* 1.75*  TROPONINI <0.03  --     Estimated Creatinine Clearance: 49.6 mL/min (by C-G formula based on SCr of 1.75 mg/dL (H)).   Medical History: Past Medical History:  Diagnosis Date  . A-fib ([facility])   . Abnormal heart rhythm   . Arthritis   . Asthma   . Diabetes mellitus without complication ([facility])   . GERD (gastroesophageal reflux disease)   . Hypercholesteremia   . Hypertension   . Hyperthyroidism   . Kidney stones     Medications:  Infusions:    Assessment: 70 yom cc CP/SOB/hemoptysis. Increasing cough and progressive SOB x 1 week, over last 24 hours patient had fever and chills as well as some hemoptysis. ED found bilateral pneumonia with sepsis criteria. Pharmacy consulted to dose ABX for PNA and to continue VKA for AF from home.  Goal of Therapy:  INR 2-3 Monitor platelets by anticoagulation protocol: Yes   Plan:  INR therapeutic on admission. Warfarin currently on hold due to hemopysis. Hgb stable. Will follow up for plan to resume  Olene FlossMelissa D Helia Haese, Pharm.D., BCPS Clinical Pharmacist 04/23/2016,1:44 PM

## 2016-04-23 NOTE — Progress Notes (Signed)
[employer] [facility] - [place] [facility]   PATIENT NAME: Alan MuskratSteve Stafford    MR#:  [id number]  DATE OF BIRTH:  Apr 16, 1945  SUBJECTIVE:  CHIEF COMPLAINT:   Chief Complaint  Patient presents with  . Chest Pain  . Shortness of Breath  . Hemoptysis   Still has mild cough. Hemoptysis resolved. Shortness of breath is improving. Afebrile.  REVIEW OF SYSTEMS:    Review of Systems  Constitutional: Positive for malaise/fatigue. Negative for chills and fever.  HENT: Negative for sore throat.   Eyes: Negative for blurred vision, double vision and pain.  Respiratory: Positive for cough and shortness of breath. Negative for hemoptysis and wheezing.   Cardiovascular: Negative for chest pain, palpitations, orthopnea and leg swelling.  Gastrointestinal: Negative for abdominal pain, constipation, diarrhea, heartburn, nausea and vomiting.  Genitourinary: Negative for dysuria and hematuria.  Musculoskeletal: Negative for back pain and joint pain.  Skin: Negative for rash.  Neurological: Positive for weakness. Negative for sensory change, speech change, focal weakness and headaches.  Endo/Heme/Allergies: Does not bruise/bleed easily.  Psychiatric/Behavioral: Negative for depression. The patient is not nervous/anxious.     DRUG ALLERGIES:   Allergies  Allergen Reactions  . Statins Other (See Comments)    Leg weakness  . Sulfa Antibiotics Rash    VITALS:  Blood pressure 103/71, pulse 91, temperature 98 F (36.7 C), temperature source Oral, resp. rate 18, height 6\' 2"  (1.88 m), weight 99.8 kg (220 lb), SpO2 96 %.  PHYSICAL EXAMINATION:   Physical Exam  GENERAL:  71 y.o.-year-old patient lying in the bed with no acute distress.  EYES: Pupils equal, round, reactive to light and accommodation. No scleral icterus. Extraocular muscles intact.  HEENT: Head atraumatic, normocephalic. Oropharynx and nasopharynx clear.  NECK:  Supple, no jugular venous distention. No thyroid enlargement,  no tenderness.  LUNGS: Normal breath sounds bilaterally, no wheezing, rales, rhonchi. No use of accessory muscles of respiration.  CARDIOVASCULAR: S1, S2 normal. No murmurs, rubs, or gallops.  ABDOMEN: Soft, nontender, nondistended. Bowel sounds present. No organomegaly or mass.  EXTREMITIES: No cyanosis, clubbing or edema b/l.    NEUROLOGIC: Cranial nerves II through XII are intact. No focal Motor or sensory deficits b/l.   PSYCHIATRIC: The patient is alert and oriented x 3.  SKIN: No obvious rash, lesion, or ulcer.   LABORATORY PANEL:   CBC  Recent Labs Lab 04/23/16 0259  WBC 7.5  HGB 10.6*  HCT 33.8*  PLT 213   ------------------------------------------------------------------------------------------------------------------ Chemistries   Recent Labs Lab 04/22/16 2004 04/23/16 0259  NA 134* 132*  K 4.8 4.6  CL 101 103  CO2 23 22  GLUCOSE 119* 132*  BUN 26* 28*  CREATININE 1.60* 1.75*  CALCIUM 8.4* 8.1*  AST 55*  --   ALT 42  --   ALKPHOS 83  --   BILITOT 1.2  --    ------------------------------------------------------------------------------------------------------------------  Cardiac Enzymes  Recent Labs Lab 04/22/16 2004  TROPONINI <0.03   ------------------------------------------------------------------------------------------------------------------  RADIOLOGY:  Dg Chest 2 View  Result Date: 04/22/2016 CLINICAL DATA:  Chest pain, shortness of breath, and hemoptysis. EXAM: CHEST  2 VIEW COMPARISON:  03/02/2010 FINDINGS: The patient is mildly rotated to the right. There is new enlargement of the cardiac silhouette. There are new relatively diffuse interstitial opacities bilaterally, greatest in the lung bases and slightly greater on the right than on the left. No sizable pleural effusion is identified. No acute osseous abnormality is seen. IMPRESSION: New cardiomegaly and bilateral interstitial opacities which may  reflect edema, atypical infection, or  other interstitial lung disease. Electronically Signed   By: Sebastian Ache M.D.   On: 04/22/2016 20:13   Ct Angio Chest Pe W And/or Wo Contrast  Result Date: 04/22/2016 CLINICAL DATA:  Subacute onset of shortness of breath, hemoptysis and generalized chest pain. Initial encounter. EXAM: CT ANGIOGRAPHY CHEST WITH CONTRAST TECHNIQUE: Multidetector CT imaging of the chest was performed using the standard protocol during bolus administration of intravenous contrast. Multiplanar CT image reconstructions and MIPs were obtained to evaluate the vascular anatomy. CONTRAST:  60 mL of Isovue 370 IV contrast COMPARISON:  Chest radiograph performed earlier today at 7:50 p.m. FINDINGS: Cardiovascular:  There is no evidence of pulmonary embolus. The heart is mildly enlarged. The thoracic aorta is grossly unremarkable, aside from minimal calcification. The great vessels are unremarkable in appearance. Mediastinum/Nodes: A small hiatal hernia is seen. Enlarged mediastinal and left hilar nodes are seen, measuring up to 1.8 cm at the azygoesophageal recess and 1.4 cm at the right paratracheal region, 1.3 cm at the periaortic region and 1.3 cm at the left hilum. Trace pericardial fluid remains within normal limits. The thyroid gland is unremarkable in appearance. No axillary lymphadenopathy is seen. Lungs/Pleura: Multiple areas of ground-glass opacification and additional more dense airspace opacities are seen throughout both lungs, with trace bilateral pleural effusions. This more likely reflects pneumonia or pulmonary edema, though inflammatory or other processes could have a similar appearance, given the underlying lymphadenopathy. No pneumothorax is seen. Mild interstitial prominence is noted. Upper Abdomen: The visualized portions of the liver and spleen are grossly unremarkable, aside from scattered calcified granulomata within the spleen. The visualized portions of the pancreas, adrenal glands and kidneys are grossly  unremarkable. Musculoskeletal: No acute osseous abnormalities are identified. There is mild chronic compression deformity of vertebral bodies T3 and T4. The visualized musculature is unremarkable in appearance. Review of the MIP images confirms the above findings. IMPRESSION: 1. No evidence of pulmonary embolus. 2. Multiple areas of ground-glass opacification and additional more dense airspace opacities seen throughout both lungs, with trace bilateral pleural effusions. This more likely reflects pneumonia or pulmonary edema, though inflammatory or other processes could have a similar appearance, given underlying lymphadenopathy. 3. Enlarged mediastinal and left hilar nodes, measuring up to 1.8 cm at the azygoesophageal recess and 1.3 cm at the left hilum. 4. Mild cardiomegaly. 5. Small hiatal hernia seen. 6. Mild chronic compression deformity of vertebral bodies T3 and T4. Electronically Signed   By: Roanna Raider M.D.   On: 04/22/2016 22:23     ASSESSMENT AND PLAN:   * Bilateral community-acquired pneumonia with sepsis IV antibiotics. Waiting for culture results. Influenza A and B PCR negative  * Hemoptysis likely from pneumonia and being on Coumadin Hold Coumadin. Hemoptysis improving.  * Paroxysmal atrial fibrillation Continue flecainide and Toprol  * Hypertension Hold enalapril at this time.  * CKD stage III is stable   All the records are reviewed and case discussed with Care Management/Social Workerr. Management plans discussed with the patient, family and they are in agreement.  CODE STATUS: FULL CODE  DVT Prophylaxis: SCDs  TOTAL TIME TAKING CARE OF THIS PATIENT: 35 minutes.   POSSIBLE D/C IN 1-2 DAYS, DEPENDING ON CLINICAL CONDITION.  Milagros Loll R M.D on 04/23/2016 at 10:51 AM  Between 7am to 6pm - Pager - [phone]  After 6pm go to [email or web address] - password [email or web address] [facility]  SOUND  Hospitalists  Office  [phone]  CC: Primary care physician;  Lindwood Qua,  MD  Note: This dictation was prepared with Dragon dictation along with smaller phrase technology. Any transcriptional errors that result from this process are unintentional.

## 2016-04-23 NOTE — Progress Notes (Signed)
Chaplain responded to an OR for prayer. CH met the Pt and with daughter at his bedside. Pt appeared calm and talked about his 3 daughters, grandchildren, and great grandchildren. Pt spoke about his religious affiliation and his relationship with God. Pt requested prayers for healing, which the [facility] offered for the Pt and his family. Belt observes that the Pt is relying on his spiritual connection to cope with sickness and was looking forward to be discharged tomorrow.      04/23/16 1400  Clinical Encounter Type  Visited With Patient;Family  Visit Type Initial;Spiritual support  Referral From Nurse  Consult/Referral To Chaplain  Spiritual Encounters  Spiritual Needs Prayer

## 2016-04-24 LAB — CBC WITH DIFFERENTIAL/PLATELET
Basophils Absolute: 0 10*3/uL (ref 0–0.1)
Basophils Relative: 0 %
EOS PCT: 1 %
Eosinophils Absolute: 0.1 10*3/uL (ref 0–0.7)
HCT: 33.4 % — ABNORMAL LOW (ref 40.0–52.0)
Hemoglobin: 11 g/dL — ABNORMAL LOW (ref 13.0–18.0)
LYMPHS PCT: 6 %
Lymphs Abs: 0.5 10*3/uL — ABNORMAL LOW (ref 1.0–3.6)
MCH: 24.3 pg — AB (ref 26.0–34.0)
MCHC: 33.1 g/dL (ref 32.0–36.0)
MCV: 73.4 fL — AB (ref 80.0–100.0)
MONO ABS: 0.4 10*3/uL (ref 0.2–1.0)
Monocytes Relative: 6 %
Neutro Abs: 6.5 10*3/uL (ref 1.4–6.5)
Neutrophils Relative %: 87 %
PLATELETS: 219 10*3/uL (ref 150–440)
RBC: 4.55 MIL/uL (ref 4.40–5.90)
RDW: 18.9 % — ABNORMAL HIGH (ref 11.5–14.5)
WBC: 7.5 10*3/uL (ref 3.8–10.6)

## 2016-04-24 LAB — GLUCOSE, CAPILLARY
Glucose-Capillary: 135 mg/dL — ABNORMAL HIGH (ref 65–99)
Glucose-Capillary: 117 mg/dL — ABNORMAL HIGH (ref 65–99)
Glucose-Capillary: 155 mg/dL — ABNORMAL HIGH (ref 65–99)
Glucose-Capillary: 273 mg/dL — ABNORMAL HIGH (ref 65–99)

## 2016-04-24 LAB — HEMOGLOBIN A1C
Hgb A1c MFr Bld: 6.2 % — ABNORMAL HIGH (ref 4.8–5.6)
MEAN PLASMA GLUCOSE: 131 mg/dL

## 2016-04-24 LAB — ECHOCARDIOGRAM COMPLETE
Height: 74 in
WEIGHTICAEL: 3520 [oz_av]

## 2016-04-24 MED ORDER — LEVOFLOXACIN IN D5W 750 MG/150ML IV SOLN
750.0000 mg | Freq: Every day | INTRAVENOUS | Status: DC
  Administered 2016-04-24: 750 mg via INTRAVENOUS
  Filled 2016-04-24 (×2): qty 150

## 2016-04-24 MED ORDER — PREDNISONE 50 MG PO TABS
50.0000 mg | ORAL_TABLET | Freq: Every day | ORAL | Status: DC
  Administered 2016-04-24 – 2016-04-25 (×2): 50 mg via ORAL
  Filled 2016-04-24 (×2): qty 1

## 2016-04-24 NOTE — Evaluation (Signed)
Physical Therapy Evaluation Patient Details Name: Alan Stafford MRN: [id number] DOB: 1945/02/26 Today's Date: 04/24/2016   History of Present Illness  pt is a 71 yo male admitted w/ progressive cough and SOB, found to have bilateral pneumonia, negative imgaing for PE. PMH includes; A-fib, HTN, HLD, Diabetes, abnormaly heart rhythym     Clinical Impression  Pt sleeping upon entrance but able to wake up and participate in PT, initially was a little annoyed but become more pleasant throughout session. Overall displays good strength and able to perform bed mobility, transfers and ambulation independently under PT supervision. Scored a 12/12 on modified DGI indicative of low fall risk and good overall balance. Pt does not display any current PT needs and appears to be functioning at baseline levels and is functionally safe for dc home following hospital stay. Will complete orders at this time. .     Follow Up Recommendations No PT follow up    Equipment Recommendations  None recommended by PT    Recommendations for Other Services       Precautions / Restrictions Precautions Precautions: None Restrictions Weight Bearing Restrictions: No      Mobility  Bed Mobility Overal bed mobility: Independent             General bed mobility comments: able to transfer OOB w/ little to no difficulty  Transfers Overall transfer level: Independent Equipment used: None             General transfer comment: sit to stand transfers w/o AD and no signs of instability   Ambulation/Gait Ambulation/Gait assistance: Supervision Ambulation Distance (Feet): 200 Feet Assistive device: None Gait Pattern/deviations: WFL(Within Functional Limits)   Gait velocity interpretation: at or above normal speed for age/gender General Gait Details: hard heel strike but states that is normal, slight lateral swaying during ambulation but no LOB, staggering or path deviations   Stairs             Wheelchair Mobility    Modified Rankin (Stroke Patients Only)       Balance Overall balance assessment: Independent                                           Pertinent Vitals/Pain Pain Assessment: No/denies pain    Home Living Family/patient expects to be discharged to:: Private residence Living Arrangements: Spouse/significant other Available Help at Discharge: Family;Available 24 hours/day Type of Home: House Home Access: Level entry     Home Layout: One level Home Equipment: None      Prior Function Level of Independence: Independent               Hand Dominance   Dominant Hand: Right    Extremity/Trunk Assessment   Upper Extremity Assessment Upper Extremity Assessment: Overall WFL for tasks assessed    Lower Extremity Assessment Lower Extremity Assessment: Overall WFL for tasks assessed       Communication   Communication: No difficulties  Cognition Arousal/Alertness: Awake/alert Behavior During Therapy: WFL for tasks assessed/performed Overall Cognitive Status: Within Functional Limits for tasks assessed                      General Comments General comments (skin integrity, edema, etc.): Modified DGI 12/12 - low fall risk    Exercises     Assessment/Plan    PT Assessment Patent does not need any further  PT services  PT Problem List         PT Treatment Interventions      PT Goals (Current goals can be found in the Care Plan section)  Acute Rehab PT Goals Patient Stated Goal: Return home PT Goal Formulation: With patient/family Time For Goal Achievement: 05/08/16 Potential to Achieve Goals: Good    Frequency     Barriers to discharge        Co-evaluation               End of Session Equipment Utilized During Treatment: Gait belt Activity Tolerance: Patient tolerated treatment well Patient left: in chair;with call bell/phone within reach;with nursing/sitter in room;with family/visitor  present Nurse Communication: Mobility status           Time: 1342-1410 PT Time Calculation (min) (ACUTE ONLY): 28 min   Charges:         PT G Codes:         [employer] Student PT 04/24/2016, 4:27 PM

## 2016-04-24 NOTE — Progress Notes (Signed)
[facility] [facility] - [place] [facility]   PATIENT NAME: Alan MuskratSteve Stafford    MR#:  [id number]  DATE OF BIRTH:  1946-01-18  SUBJECTIVE:  CHIEF COMPLAINT:   Chief Complaint  Patient presents with  . Chest Pain  . Shortness of Breath  . Hemoptysis   More sleepy and weak today. No further hemoptysis  Febrile and tachycardic earlier  REVIEW OF SYSTEMS:    Review of Systems  Constitutional: Positive for malaise/fatigue. Negative for chills and fever.  HENT: Negative for sore throat.   Eyes: Negative for blurred vision, double vision and pain.  Respiratory: Positive for cough and shortness of breath. Negative for hemoptysis and wheezing.   Cardiovascular: Negative for chest pain, palpitations, orthopnea and leg swelling.  Gastrointestinal: Negative for abdominal pain, constipation, diarrhea, heartburn, nausea and vomiting.  Genitourinary: Negative for dysuria and hematuria.  Musculoskeletal: Negative for back pain and joint pain.  Skin: Negative for rash.  Neurological: Positive for weakness. Negative for sensory change, speech change, focal weakness and headaches.  Endo/Heme/Allergies: Does not bruise/bleed easily.  Psychiatric/Behavioral: Negative for depression. The patient is not nervous/anxious.     DRUG ALLERGIES:   Allergies  Allergen Reactions  . Statins Other (See Comments)    Leg weakness  . Sulfa Antibiotics Rash    VITALS:  Blood pressure 117/66, pulse 93, temperature 98.2 F (36.8 C), temperature source Oral, resp. rate 20, height 6\' 2"  (1.88 m), weight 99.8 kg (220 lb), SpO2 98 %.  PHYSICAL EXAMINATION:   Physical Exam  GENERAL:  71 y.o.-year-old patient lying in the bed with no acute distress.  EYES: Pupils equal, round, reactive to light and accommodation. No scleral icterus. Extraocular muscles intact.  HEENT: Head atraumatic, normocephalic. Oropharynx and nasopharynx clear.  NECK:  Supple, no jugular venous distention. No thyroid enlargement,  no tenderness.  LUNGS: Normal breath sounds bilaterally, no wheezing, rales, rhonchi. No use of accessory muscles of respiration.  CARDIOVASCULAR: S1, S2 normal. No murmurs, rubs, or gallops.  ABDOMEN: Soft, nontender, nondistended. Bowel sounds present. No organomegaly or mass.  EXTREMITIES: No cyanosis, clubbing or edema b/l.    NEUROLOGIC: Cranial nerves II through XII are intact. No focal Motor or sensory deficits b/l.   PSYCHIATRIC: The patient is alert and oriented x 3.  SKIN: No obvious rash, lesion, or ulcer.   LABORATORY PANEL:   CBC  Recent Labs Lab 04/23/16 0259  WBC 7.5  HGB 10.6*  HCT 33.8*  PLT 213   ------------------------------------------------------------------------------------------------------------------ Chemistries   Recent Labs Lab 04/22/16 2004 04/23/16 0259  NA 134* 132*  K 4.8 4.6  CL 101 103  CO2 23 22  GLUCOSE 119* 132*  BUN 26* 28*  CREATININE 1.60* 1.75*  CALCIUM 8.4* 8.1*  AST 55*  --   ALT 42  --   ALKPHOS 83  --   BILITOT 1.2  --    ------------------------------------------------------------------------------------------------------------------  Cardiac Enzymes  Recent Labs Lab 04/22/16 2004  TROPONINI <0.03   ------------------------------------------------------------------------------------------------------------------  RADIOLOGY:  Dg Chest 2 View  Result Date: 04/22/2016 CLINICAL DATA:  Chest pain, shortness of breath, and hemoptysis. EXAM: CHEST  2 VIEW COMPARISON:  03/02/2010 FINDINGS: The patient is mildly rotated to the right. There is new enlargement of the cardiac silhouette. There are new relatively diffuse interstitial opacities bilaterally, greatest in the lung bases and slightly greater on the right than on the left. No sizable pleural effusion is identified. No acute osseous abnormality is seen. IMPRESSION: New cardiomegaly and bilateral interstitial opacities which  may reflect edema, atypical infection, or  other interstitial lung disease. Electronically Signed   By: Sebastian Ache M.D.   On: 04/22/2016 20:13   Ct Angio Chest Pe W And/or Wo Contrast  Result Date: 04/22/2016 CLINICAL DATA:  Subacute onset of shortness of breath, hemoptysis and generalized chest pain. Initial encounter. EXAM: CT ANGIOGRAPHY CHEST WITH CONTRAST TECHNIQUE: Multidetector CT imaging of the chest was performed using the standard protocol during bolus administration of intravenous contrast. Multiplanar CT image reconstructions and MIPs were obtained to evaluate the vascular anatomy. CONTRAST:  60 mL of Isovue 370 IV contrast COMPARISON:  Chest radiograph performed earlier today at 7:50 p.m. FINDINGS: Cardiovascular:  There is no evidence of pulmonary embolus. The heart is mildly enlarged. The thoracic aorta is grossly unremarkable, aside from minimal calcification. The great vessels are unremarkable in appearance. Mediastinum/Nodes: A small hiatal hernia is seen. Enlarged mediastinal and left hilar nodes are seen, measuring up to 1.8 cm at the azygoesophageal recess and 1.4 cm at the right paratracheal region, 1.3 cm at the periaortic region and 1.3 cm at the left hilum. Trace pericardial fluid remains within normal limits. The thyroid gland is unremarkable in appearance. No axillary lymphadenopathy is seen. Lungs/Pleura: Multiple areas of ground-glass opacification and additional more dense airspace opacities are seen throughout both lungs, with trace bilateral pleural effusions. This more likely reflects pneumonia or pulmonary edema, though inflammatory or other processes could have a similar appearance, given the underlying lymphadenopathy. No pneumothorax is seen. Mild interstitial prominence is noted. Upper Abdomen: The visualized portions of the liver and spleen are grossly unremarkable, aside from scattered calcified granulomata within the spleen. The visualized portions of the pancreas, adrenal glands and kidneys are grossly  unremarkable. Musculoskeletal: No acute osseous abnormalities are identified. There is mild chronic compression deformity of vertebral bodies T3 and T4. The visualized musculature is unremarkable in appearance. Review of the MIP images confirms the above findings. IMPRESSION: 1. No evidence of pulmonary embolus. 2. Multiple areas of ground-glass opacification and additional more dense airspace opacities seen throughout both lungs, with trace bilateral pleural effusions. This more likely reflects pneumonia or pulmonary edema, though inflammatory or other processes could have a similar appearance, given underlying lymphadenopathy. 3. Enlarged mediastinal and left hilar nodes, measuring up to 1.8 cm at the azygoesophageal recess and 1.3 cm at the left hilum. 4. Mild cardiomegaly. 5. Small hiatal hernia seen. 6. Mild chronic compression deformity of vertebral bodies T3 and T4. Electronically Signed   By: Roanna Raider M.D.   On: 04/22/2016 22:23     ASSESSMENT AND PLAN:   * Bilateral community-acquired pneumonia with sepsis Continues to be febrile today 101.7 IV antibiotics. Waiting for culture results. Influenza A and B PCR negative  Continue IV abx as inpatient  * Hemoptysis likely from pneumonia and being on Coumadin Hold Coumadin. Hemoptysis improving.  * Paroxysmal atrial fibrillation Continue flecainide and Toprol  * Hypertension Hold enalapril at this time.  * CKD stage III is stable   All the records are reviewed and case discussed with Care Management/Social Workerr. Management plans discussed with the patient, family and they are in agreement.  CODE STATUS: FULL CODE  DVT Prophylaxis: SCDs  TOTAL TIME TAKING CARE OF THIS PATIENT: 35 minutes.   POSSIBLE D/C IN 1-2 DAYS, DEPENDING ON CLINICAL CONDITION.  Milagros Loll R M.D on 04/24/2016 at 11:53 AM  Between 7am to 6pm - Pager - [phone]  After 6pm go to [email or web address] - password [email or web address]  SOUND  [place]  [facility]  Office  [phone]  CC: Primary care physician; Lindwood Qua, MD  Note: This dictation was prepared with Dragon dictation along with smaller phrase technology. Any transcriptional errors that result from this process are unintentional.

## 2016-04-25 LAB — GLUCOSE, CAPILLARY: GLUCOSE-CAPILLARY: 135 mg/dL — AB (ref 65–99)

## 2016-04-25 LAB — BASIC METABOLIC PANEL
Anion gap: 7 (ref 5–15)
BUN: 25 mg/dL — AB (ref 6–20)
CHLORIDE: 107 mmol/L (ref 101–111)
CO2: 24 mmol/L (ref 22–32)
CREATININE: 1.58 mg/dL — AB (ref 0.61–1.24)
Calcium: 8.7 mg/dL — ABNORMAL LOW (ref 8.9–10.3)
GFR calc Af Amer: 49 mL/min — ABNORMAL LOW (ref >60)
GFR calc non Af Amer: 43 mL/min — ABNORMAL LOW (ref >60)
Glucose, Bld: 129 mg/dL — ABNORMAL HIGH (ref 65–99)
Potassium: 4.5 mmol/L (ref 3.5–5.1)
Sodium: 138 mmol/L (ref 135–145)

## 2016-04-25 LAB — STREP PNEUMONIAE URINARY ANTIGEN: Strep Pneumo Urinary Antigen: NEGATIVE

## 2016-04-25 LAB — PROCALCITONIN: Procalcitonin: 0.24 ng/mL

## 2016-04-25 MED ORDER — PREDNISONE 50 MG PO TABS: 50.0000 mg | ORAL_TABLET | Freq: Every day | ORAL | 0 refills | Status: AC

## 2016-04-25 MED ORDER — GUAIFENESIN-DM 100-10 MG/5ML PO SYRP: 5.0000 mL | ORAL_SOLUTION | ORAL | 0 refills | Status: DC | PRN

## 2016-04-25 MED ORDER — LEVOFLOXACIN 500 MG PO TABS: 500.0000 mg | ORAL_TABLET | Freq: Every day | ORAL | 0 refills | Status: DC

## 2016-04-25 NOTE — Care Management Important Message (Signed)
Important Message  Patient Details  Name: Angelique BlonderSteve D Costanza MRN: [id number] Date of Birth: [birth year]   Medicare Important Message Given:  Yes    Chapman FitchBOWEN, Edouard Gikas T, RN 04/25/2016, 10:44 AM

## 2016-04-25 NOTE — Progress Notes (Signed)
IV was removed. Discharge instructions and follow-up appointments were given to pt and wife at bedside. Prescriptions were sent to pt's pharmacy. The pt was taken downstairs via wheelchair by volunteer services.

## 2016-04-25 NOTE — Discharge Instructions (Signed)
Resume diet and activity as before ° ° °

## 2016-04-27 LAB — CULTURE, BLOOD (ROUTINE X 2)
CULTURE: NO GROWTH
Culture: NO GROWTH

## 2016-05-01 NOTE — Discharge Summary (Signed)
[facility] NAME: Alan Stafford    MR#:  [id number]  DATE OF BIRTH:  May 13, 1945  DATE OF ADMISSION:  04/22/2016 ADMITTING PHYSICIAN: Lance Coon, MD  DATE OF DISCHARGE: 04/25/2016 11:49 AM  PRIMARY CARE PHYSICIAN: Raelene Bott, MD   ADMISSION DIAGNOSIS:  Community acquired pneumonia, unspecified laterality [J18.9] Sepsis ([place]) [A41.9]  DISCHARGE DIAGNOSIS:  Principal Problem:   Sepsis ([place]) Active Problems:   CAD in native artery   Essential (primary) hypertension   Acid reflux   Type 2 diabetes mellitus ([place])   CAP (community acquired pneumonia)   Hemoptysis   SECONDARY DIAGNOSIS:   Past Medical History:  Diagnosis Date  . A-fib ([place])   . Abnormal heart rhythm   . Arthritis   . Asthma   . Diabetes mellitus without complication ([place])   . GERD (gastroesophageal reflux disease)   . Hypercholesteremia   . Hypertension   . Hyperthyroidism   . Kidney stones      ADMITTING HISTORY  HISTORY OF PRESENT ILLNESS:  Alan Stafford  is a 71 y.o. male who presents with Cough, fever and chills, shortness of breath. Patient states that for the past week or so he's been having increasing cough and some progressive shortness of breath. Over the last 24 hours he had significant fever and chills as well as some hemoptysis. Here in the ED he was found to have significant bilateral pneumonia, and met sepsis criteria. Hospitalists were called for admission.   HOSPITAL COURSE:   * Bilateral community-acquired pneumonia with sepsis Influenza PCR negative. Patient was treated with vancomycin and Zosyn in the emergency room. Due to his community-acquired pneumonia and was admitted with IV Levaquin. He did have one episode of fever during the hospital stay and afebrile on day of discharge. No further cough or shortness of breath. He had some mild hemoptysis on admission which has resolved. Cultures negative. Normal WBC and afebrile. Patient has  ambulated in the hallway without any problems and is being discharged home in stable condition.  He is being discharged home on oral Levaquin and prednisone. Has nebulizers at home.  * Hemoptysis likely from pneumonia and being on Coumadin Hold Coumadin. Hemoptysis resolved. Can start Coumadin.  * Paroxysmal atrial fibrillation Continue flecainide and Toprol  * Hypertension Continue home medications  * CKD stage III is stable  Discharged home in stable condition  CONSULTS OBTAINED:  Treatment Team:  Erby Pian, MD  DRUG ALLERGIES:   Allergies  Allergen Reactions  . Statins Other (See Comments)    Leg weakness  . Sulfa Antibiotics Rash    DISCHARGE MEDICATIONS:   Discharge Medication List as of 04/25/2016 10:36 AM    START taking these medications   Details  guaiFENesin-dextromethorphan (ROBITUSSIN DM) 100-10 MG/5ML syrup Take 5 mLs by mouth every 4 (four) hours as needed for cough., Starting Thu 04/25/2016, Normal    levofloxacin (LEVAQUIN) 500 MG tablet Take 1 tablet (500 mg total) by mouth daily., Starting Thu 04/25/2016, Normal    predniSONE (DELTASONE) 50 MG tablet Take 1 tablet (50 mg total) by mouth daily with breakfast., Starting Fri 04/26/2016, Until Mon 04/29/2016, Normal      CONTINUE these medications which have NOT CHANGED   Details  albuterol (PROVENTIL HFA;VENTOLIN HFA) 108 (90 Base) MCG/ACT inhaler Inhale 1 puff into the lungs every 6 (six) hours as needed for wheezing or shortness of breath. , Historical Med    Cholecalciferol (VITAMIN D3) 1000 units CAPS Take  1,000 Units by mouth daily. , Historical Med    clotrimazole-betamethasone (LOTRISONE) cream Apply to affected area 2 times daily, Historical Med    enalapril (VASOTEC) 10 MG tablet Take 5 mg by mouth daily., Historical Med    escitalopram (LEXAPRO) 20 MG tablet Take 20 mg by mouth., Starting Tue 04/18/2015, Historical Med    flecainide (TAMBOCOR) 100 MG tablet Take 100 mg by mouth 2 (two)  times daily., Historical Med    HYDROcodone-acetaminophen (NORCO) 10-325 MG tablet Take 2 tablets by mouth every 6 (six) hours as needed for pain., Historical Med    linaclotide (LINZESS) 145 MCG CAPS capsule Take 145 mcg by mouth daily., Historical Med    metFORMIN (GLUCOPHAGE) 500 MG tablet Take 500 mg by mouth 3 (three) times daily with meals. , Historical Med    methimazole (TAPAZOLE) 10 MG tablet Take 10 mg by mouth 3 (three) times daily., Historical Med    metoprolol succinate (TOPROL-XL) 25 MG 24 hr tablet Take 25 mg by mouth daily., Historical Med    montelukast (SINGULAIR) 10 MG tablet Take 10 mg by mouth at bedtime. , Historical Med    nitroGLYCERIN (NITROSTAT) 0.4 MG SL tablet Place 0.4 mg under the tongue every 5 (five) minutes as needed for chest pain. , Historical Med    omeprazole (PRILOSEC) 20 MG capsule Take 20 mg by mouth daily. , Historical Med    pregabalin (LYRICA) 100 MG capsule Take 100 mg by mouth 2 (two) times daily., Historical Med    sildenafil (REVATIO) 20 MG tablet TAKE ONE TO TWO TABLETS BY MOUTH ONCE DAILY AS NEEDED *DO NOT TAKE NITROGLYCERIN AFTERWARD*, Historical Med    SYMBICORT 160-4.5 MCG/ACT inhaler Inhale 2 puffs into the lungs. , Starting Tue 03/05/2016, Historical Med    testosterone cypionate (DEPOTESTOSTERONE CYPIONATE) 200 MG/ML injection Inject 400 mg into the muscle every 14 (fourteen) days. , Historical Med    traMADol (ULTRAM) 50 MG tablet Take 100 mg by mouth every 6 (six) hours as needed for moderate pain or severe pain. , Historical Med    warfarin (COUMADIN) 5 MG tablet Take 5 mg by mouth daily., Historical Med    aspirin EC 81 MG tablet Take 81 mg by mouth., Starting Tue 01/01/2011, Historical Med    Blood Glucose Monitoring Suppl (FIFTY50 GLUCOSE METER 2.0) w/Device KIT 1 each by Other route once. Use as instructed to check fasting BS daily.   Glucometer ; One-Touch, Historical Med    !! glucose blood (ACCU-CHEK COMFORT CURVE) test  strip Historical Med    !! glucose blood (ONE TOUCH ULTRA TEST) test strip USE AS DIRECTED, Historical Med    !! lactulose (CHRONULAC) 10 GM/15ML solution Take by mouth., Starting Thu 10/07/2013, Historical Med    !! lactulose (CHRONULAC) 10 GM/15ML solution Take by mouth., Starting Thu 10/07/2013, Historical Med    magnesium oxide (MAG-OX) 400 MG tablet Take 400 mg by mouth 2 (two) times daily., Historical Med     !! - Potential duplicate medications found. Please discuss with provider.      Today   VITAL SIGNS:  Blood pressure 123/76, pulse 96, temperature 97.6 F (36.4 C), temperature source Oral, resp. rate 20, height 6' 2"  (1.88 m), weight 99.8 kg (220 lb), SpO2 97 %.  I/O:  No intake or output data in the 24 hours ending 05/01/16 1641  PHYSICAL EXAMINATION:  Physical Exam  GENERAL:  71 y.o.-year-old patient lying in the bed with no acute distress.  LUNGS: Normal breath  sounds bilaterally, no wheezing, rales,rhonchi or crepitation. No use of accessory muscles of respiration.  CARDIOVASCULAR: S1, S2 normal. No murmurs, rubs, or gallops.  ABDOMEN: Soft, non-tender, non-distended. Bowel sounds present. No organomegaly or mass.  NEUROLOGIC: Moves all 4 extremities. PSYCHIATRIC: The patient is alert and oriented x 3.  SKIN: No obvious rash, lesion, or ulcer.   DATA REVIEW:   CBC No results for input(s): WBC, HGB, HCT, PLT in the last 168 hours.  Chemistries   Recent Labs Lab 04/25/16 0506  NA 138  K 4.5  CL 107  CO2 24  GLUCOSE 129*  BUN 25*  CREATININE 1.58*  CALCIUM 8.7*    Cardiac Enzymes No results for input(s): TROPONINI in the last 168 hours.  Microbiology Results  Results for orders placed or performed during the hospital encounter of 04/22/16  Culture, blood (Routine x 2)     Status: None   Collection Time: 04/22/16  8:04 PM  Result Value Ref Range Status   Specimen Description BLOOD LEFT FA  Final   Special Requests BOTTLES DRAWN AEROBIC AND  ANAEROBIC BCAV  Final   Culture NO GROWTH 5 DAYS  Final   Report Status 04/27/2016 FINAL  Final  Culture, blood (Routine x 2)     Status: None   Collection Time: 04/22/16  8:28 PM  Result Value Ref Range Status   Specimen Description BLOOD RIGHT FA  Final   Special Requests BOTTLES DRAWN AEROBIC AND ANAEROBIC BCAV  Final   Culture NO GROWTH 5 DAYS  Final   Report Status 04/27/2016 FINAL  Final    RADIOLOGY:  No results found.  Follow up with PCP in 1 week.  Management plans discussed with the patient, family and they are in agreement.  CODE STATUS:  Code Status History    Date Active Date Inactive Code Status Order ID Comments User Context   04/23/2016  2:47 AM 04/25/2016  2:54 PM Full Code [id number]  Lance Coon, MD Inpatient    Advance Directive Documentation   Flowsheet Row Most Recent Value  Type of Advance Directive  Living will  Pre-existing out of facility DNR order (yellow form or pink MOST form)  No data  "MOST" Form in Place?  No data      TOTAL TIME TAKING CARE OF THIS PATIENT ON DAY OF DISCHARGE: more than 30 minutes.   Hillary Bow R M.D on 05/01/2016 at 4:41 PM  Between 7am to 6pm - Pager - [phone]  After 6pm go to [email or web address] - password [email or web address] [facility]  Office  [phone]  CC: Primary care physician; Raelene Bott, MD  Note: This dictation was prepared with Dragon dictation along with smaller phrase technology. Any transcriptional errors that result from this process are unintentional.

## 2016-05-13 ENCOUNTER — Ambulatory Visit: Payer: Medicare Other | Admitting: Pain Medicine

## 2016-05-18 ENCOUNTER — Encounter: Payer: Self-pay | Admitting: Emergency Medicine

## 2016-05-18 ENCOUNTER — Emergency Department: Payer: Medicare Other

## 2016-05-18 ENCOUNTER — Inpatient Hospital Stay
Admission: EM | Admit: 2016-05-18 | Discharge: 2016-05-23 | DRG: 291 | Disposition: A | Discharge status: Home / Self Care | Payer: Medicare Other | Attending: Internal Medicine | Admitting: Internal Medicine

## 2016-05-18 DIAGNOSIS — N170 Acute kidney failure with tubular necrosis: Secondary | ICD-10-CM | POA: Diagnosis not present

## 2016-05-18 DIAGNOSIS — J440 Chronic obstructive pulmonary disease with acute lower respiratory infection: Secondary | ICD-10-CM | POA: Diagnosis present

## 2016-05-18 DIAGNOSIS — J189 Pneumonia, unspecified organism: Secondary | ICD-10-CM | POA: Diagnosis present

## 2016-05-18 DIAGNOSIS — I5022 Chronic systolic (congestive) heart failure: Secondary | ICD-10-CM | POA: Diagnosis not present

## 2016-05-18 DIAGNOSIS — G8929 Other chronic pain: Secondary | ICD-10-CM | POA: Diagnosis present

## 2016-05-18 DIAGNOSIS — M549 Dorsalgia, unspecified: Secondary | ICD-10-CM | POA: Diagnosis present

## 2016-05-18 DIAGNOSIS — I5043 Acute on chronic combined systolic (congestive) and diastolic (congestive) heart failure: Secondary | ICD-10-CM | POA: Diagnosis present

## 2016-05-18 DIAGNOSIS — Y95 Nosocomial condition: Secondary | ICD-10-CM | POA: Diagnosis present

## 2016-05-18 DIAGNOSIS — Z7951 Long term (current) use of inhaled steroids: Secondary | ICD-10-CM

## 2016-05-18 DIAGNOSIS — F329 Major depressive disorder, single episode, unspecified: Secondary | ICD-10-CM | POA: Diagnosis present

## 2016-05-18 DIAGNOSIS — I429 Cardiomyopathy, unspecified: Secondary | ICD-10-CM | POA: Diagnosis present

## 2016-05-18 DIAGNOSIS — I447 Left bundle-branch block, unspecified: Secondary | ICD-10-CM | POA: Diagnosis present

## 2016-05-18 DIAGNOSIS — E7800 Pure hypercholesterolemia, unspecified: Secondary | ICD-10-CM | POA: Diagnosis present

## 2016-05-18 DIAGNOSIS — E0590 Thyrotoxicosis, unspecified without thyrotoxic crisis or storm: Secondary | ICD-10-CM | POA: Diagnosis present

## 2016-05-18 DIAGNOSIS — R042 Hemoptysis: Secondary | ICD-10-CM | POA: Diagnosis present

## 2016-05-18 DIAGNOSIS — R531 Weakness: Secondary | ICD-10-CM

## 2016-05-18 DIAGNOSIS — Z7982 Long term (current) use of aspirin: Secondary | ICD-10-CM | POA: Diagnosis not present

## 2016-05-18 DIAGNOSIS — R918 Other nonspecific abnormal finding of lung field: Secondary | ICD-10-CM | POA: Diagnosis not present

## 2016-05-18 DIAGNOSIS — Z7901 Long term (current) use of anticoagulants: Secondary | ICD-10-CM | POA: Diagnosis not present

## 2016-05-18 DIAGNOSIS — Z7984 Long term (current) use of oral hypoglycemic drugs: Secondary | ICD-10-CM | POA: Diagnosis not present

## 2016-05-18 DIAGNOSIS — I482 Chronic atrial fibrillation: Secondary | ICD-10-CM | POA: Diagnosis present

## 2016-05-18 DIAGNOSIS — I959 Hypotension, unspecified: Secondary | ICD-10-CM | POA: Diagnosis not present

## 2016-05-18 DIAGNOSIS — I130 Hypertensive heart and chronic kidney disease with heart failure and stage 1 through stage 4 chronic kidney disease, or unspecified chronic kidney disease: Principal | ICD-10-CM | POA: Diagnosis present

## 2016-05-18 DIAGNOSIS — E785 Hyperlipidemia, unspecified: Secondary | ICD-10-CM | POA: Diagnosis present

## 2016-05-18 DIAGNOSIS — E1122 Type 2 diabetes mellitus with diabetic chronic kidney disease: Secondary | ICD-10-CM | POA: Diagnosis present

## 2016-05-18 DIAGNOSIS — K219 Gastro-esophageal reflux disease without esophagitis: Secondary | ICD-10-CM | POA: Diagnosis present

## 2016-05-18 DIAGNOSIS — Z87442 Personal history of urinary calculi: Secondary | ICD-10-CM

## 2016-05-18 DIAGNOSIS — N179 Acute kidney failure, unspecified: Secondary | ICD-10-CM

## 2016-05-18 DIAGNOSIS — J9601 Acute respiratory failure with hypoxia: Secondary | ICD-10-CM | POA: Diagnosis not present

## 2016-05-18 DIAGNOSIS — N183 Chronic kidney disease, stage 3 (moderate): Secondary | ICD-10-CM | POA: Diagnosis present

## 2016-05-18 DIAGNOSIS — R0902 Hypoxemia: Secondary | ICD-10-CM

## 2016-05-18 DIAGNOSIS — I509 Heart failure, unspecified: Secondary | ICD-10-CM

## 2016-05-18 LAB — URINALYSIS, COMPLETE (UACMP) WITH MICROSCOPIC
Bacteria, UA: NONE SEEN
Bilirubin Urine: NEGATIVE
Glucose, UA: NEGATIVE mg/dL
Hgb urine dipstick: NEGATIVE
Ketones, ur: NEGATIVE mg/dL
Leukocytes, UA: NEGATIVE
Nitrite: NEGATIVE
Protein, ur: 30 mg/dL — AB
SPECIFIC GRAVITY, URINE: 1.027 (ref 1.005–1.030)
SQUAMOUS EPITHELIAL / LPF: NONE SEEN
pH: 5 (ref 5.0–8.0)

## 2016-05-18 LAB — COMPREHENSIVE METABOLIC PANEL
ALBUMIN: 3.3 g/dL — AB (ref 3.5–5.0)
ALK PHOS: 72 U/L (ref 38–126)
ALT: 37 U/L (ref 17–63)
AST: 53 U/L — AB (ref 15–41)
Anion gap: 8 (ref 5–15)
BUN: 28 mg/dL — ABNORMAL HIGH (ref 6–20)
CHLORIDE: 102 mmol/L (ref 101–111)
CO2: 22 mmol/L (ref 22–32)
CREATININE: 1.75 mg/dL — AB (ref 0.61–1.24)
Calcium: 8.6 mg/dL — ABNORMAL LOW (ref 8.9–10.3)
GFR calc Af Amer: 44 mL/min — ABNORMAL LOW (ref >60)
GFR calc non Af Amer: 38 mL/min — ABNORMAL LOW (ref >60)
GLUCOSE: 221 mg/dL — AB (ref 65–99)
Potassium: 5.2 mmol/L — ABNORMAL HIGH (ref 3.5–5.1)
SODIUM: 132 mmol/L — AB (ref 135–145)
Total Bilirubin: 1.3 mg/dL — ABNORMAL HIGH (ref 0.3–1.2)
Total Protein: 7.5 g/dL (ref 6.5–8.1)

## 2016-05-18 LAB — CBC
HCT: 36.1 % — ABNORMAL LOW (ref 40.0–52.0)
HEMOGLOBIN: 12 g/dL — AB (ref 13.0–18.0)
MCH: 23.3 pg — AB (ref 26.0–34.0)
MCHC: 33.3 g/dL (ref 32.0–36.0)
MCV: 70.2 fL — ABNORMAL LOW (ref 80.0–100.0)
Platelets: 226 10*3/uL (ref 150–440)
RBC: 5.15 MIL/uL (ref 4.40–5.90)
RDW: 19.9 % — ABNORMAL HIGH (ref 11.5–14.5)
WBC: 9.6 10*3/uL (ref 3.8–10.6)

## 2016-05-18 LAB — MRSA PCR SCREENING: MRSA BY PCR: NEGATIVE

## 2016-05-18 LAB — TROPONIN I: Troponin I: <0.03 ng/mL (ref <0.03)

## 2016-05-18 LAB — PROTIME-INR
INR: 2.53
Prothrombin Time: 27.7 seconds — ABNORMAL HIGH (ref 11.4–15.2)

## 2016-05-18 LAB — GLUCOSE, CAPILLARY: Glucose-Capillary: 174 mg/dL — ABNORMAL HIGH (ref 65–99)

## 2016-05-18 MED ORDER — FUROSEMIDE 10 MG/ML IJ SOLN
20.0000 mg | Freq: Once | INTRAMUSCULAR | Status: AC
  Administered 2016-05-18: 20 mg via INTRAVENOUS
  Filled 2016-05-18: qty 2

## 2016-05-18 MED ORDER — CEFEPIME HCL 1 G IJ SOLR: 1.0000 g | Freq: Two times a day (BID) | INTRAMUSCULAR | Status: DC

## 2016-05-18 MED ORDER — MOMETASONE FURO-FORMOTEROL FUM 200-5 MCG/ACT IN AERO
2.0000 | INHALATION_SPRAY | Freq: Two times a day (BID) | RESPIRATORY_TRACT | Status: DC
  Administered 2016-05-18 – 2016-05-23 (×9): 2 via RESPIRATORY_TRACT
  Filled 2016-05-18: qty 8.8

## 2016-05-18 MED ORDER — ACETAMINOPHEN 325 MG PO TABS: 650.0000 mg | ORAL_TABLET | Freq: Four times a day (QID) | ORAL | Status: DC | PRN

## 2016-05-18 MED ORDER — INSULIN ASPART 100 UNIT/ML ~~LOC~~ SOLN
0.0000 [IU] | Freq: Every day | SUBCUTANEOUS | Status: DC
  Administered 2016-05-20: 2 [IU] via SUBCUTANEOUS
  Administered 2016-05-22: 0 [IU] via SUBCUTANEOUS
  Filled 2016-05-18: qty 2

## 2016-05-18 MED ORDER — MONTELUKAST SODIUM 10 MG PO TABS
10.0000 mg | ORAL_TABLET | Freq: Every day | ORAL | Status: DC
  Administered 2016-05-18 – 2016-05-19 (×2): 10 mg via ORAL
  Filled 2016-05-18 (×2): qty 1

## 2016-05-18 MED ORDER — HYDROCODONE-ACETAMINOPHEN 10-325 MG PO TABS
2.0000 | ORAL_TABLET | Freq: Four times a day (QID) | ORAL | Status: DC | PRN
  Administered 2016-05-18 – 2016-05-21 (×5): 2 via ORAL
  Filled 2016-05-18 (×5): qty 2

## 2016-05-18 MED ORDER — ESCITALOPRAM OXALATE 10 MG PO TABS
40.0000 mg | ORAL_TABLET | Freq: Every day | ORAL | Status: DC
  Administered 2016-05-19 – 2016-05-23 (×4): 40 mg via ORAL
  Filled 2016-05-18 (×4): qty 4

## 2016-05-18 MED ORDER — GUAIFENESIN-DM 100-10 MG/5ML PO SYRP
5.0000 mL | ORAL_SOLUTION | ORAL | Status: DC | PRN
Start: 2016-05-18 — End: 2016-05-23
  Administered 2016-05-18: 5 mL via ORAL
  Filled 2016-05-18: qty 5

## 2016-05-18 MED ORDER — SODIUM CHLORIDE 0.9% FLUSH: 3.0000 mL | INTRAVENOUS | Status: DC | PRN

## 2016-05-18 MED ORDER — NITROGLYCERIN 0.4 MG SL SUBL: 0.4000 mg | SUBLINGUAL_TABLET | SUBLINGUAL | Status: DC | PRN

## 2016-05-18 MED ORDER — VANCOMYCIN HCL IN DEXTROSE 1-5 GM/200ML-% IV SOLN
1000.0000 mg | Freq: Once | INTRAVENOUS | Status: AC
  Administered 2016-05-18: 1000 mg via INTRAVENOUS
  Filled 2016-05-18: qty 200

## 2016-05-18 MED ORDER — DEXTROSE 5 % IV SOLN
2.0000 g | Freq: Once | INTRAVENOUS | Status: AC
  Administered 2016-05-18: 2 g via INTRAVENOUS
  Filled 2016-05-18: qty 2

## 2016-05-18 MED ORDER — DEXTROSE 5 % IV SOLN: 2.0000 g | Freq: Once | INTRAVENOUS | Status: DC

## 2016-05-18 MED ORDER — DEXTROSE 5 % IV SOLN
2.0000 g | Freq: Two times a day (BID) | INTRAVENOUS | Status: DC
  Administered 2016-05-19: 2 g via INTRAVENOUS
  Filled 2016-05-18 (×2): qty 2

## 2016-05-18 MED ORDER — VANCOMYCIN HCL 10 G IV SOLR
1250.0000 mg | INTRAVENOUS | Status: DC
  Administered 2016-05-19: 1250 mg via INTRAVENOUS
  Filled 2016-05-18 (×2): qty 1250

## 2016-05-18 MED ORDER — ACETAMINOPHEN 650 MG RE SUPP: 650.0000 mg | Freq: Four times a day (QID) | RECTAL | Status: DC | PRN

## 2016-05-18 MED ORDER — WARFARIN - PHYSICIAN DOSING INPATIENT: Freq: Every day | Status: DC

## 2016-05-18 MED ORDER — ASPIRIN EC 81 MG PO TBEC
81.0000 mg | DELAYED_RELEASE_TABLET | Freq: Every day | ORAL | Status: DC
  Administered 2016-05-19 – 2016-05-23 (×5): 81 mg via ORAL
  Filled 2016-05-18 (×5): qty 1

## 2016-05-18 MED ORDER — WARFARIN SODIUM 2.5 MG PO TABS
5.0000 mg | ORAL_TABLET | Freq: Every day | ORAL | Status: DC
  Administered 2016-05-19 – 2016-05-20 (×2): 5 mg via ORAL
  Filled 2016-05-18 (×2): qty 2

## 2016-05-18 MED ORDER — ENALAPRIL MALEATE 5 MG PO TABS
5.0000 mg | ORAL_TABLET | Freq: Every day | ORAL | Status: DC
  Administered 2016-05-19 – 2016-05-21 (×3): 5 mg via ORAL
  Filled 2016-05-18 (×3): qty 1

## 2016-05-18 MED ORDER — IOPAMIDOL (ISOVUE-370) INJECTION 76%
60.0000 mL | Freq: Once | INTRAVENOUS | Status: AC | PRN
  Administered 2016-05-18: 60 mL via INTRAVENOUS

## 2016-05-18 MED ORDER — METOPROLOL SUCCINATE ER 25 MG PO TB24
25.0000 mg | ORAL_TABLET | Freq: Every day | ORAL | Status: DC
  Administered 2016-05-19: 25 mg via ORAL
  Filled 2016-05-18: qty 1

## 2016-05-18 MED ORDER — PREGABALIN 50 MG PO CAPS
100.0000 mg | ORAL_CAPSULE | Freq: Two times a day (BID) | ORAL | Status: DC
  Administered 2016-05-18 – 2016-05-23 (×10): 100 mg via ORAL
  Filled 2016-05-18 (×10): qty 2

## 2016-05-18 MED ORDER — METHIMAZOLE 5 MG PO TABS
10.0000 mg | ORAL_TABLET | Freq: Every day | ORAL | Status: DC
  Administered 2016-05-19 – 2016-05-23 (×5): 10 mg via ORAL
  Filled 2016-05-18 (×5): qty 2

## 2016-05-18 MED ORDER — FLECAINIDE ACETATE 100 MG PO TABS
100.0000 mg | ORAL_TABLET | Freq: Two times a day (BID) | ORAL | Status: DC
  Administered 2016-05-18 – 2016-05-23 (×10): 100 mg via ORAL
  Filled 2016-05-18 (×11): qty 1

## 2016-05-18 MED ORDER — SODIUM CHLORIDE 0.9% FLUSH
3.0000 mL | Freq: Two times a day (BID) | INTRAVENOUS | Status: DC
  Administered 2016-05-18 – 2016-05-22 (×8): 3 mL via INTRAVENOUS

## 2016-05-18 MED ORDER — INSULIN ASPART 100 UNIT/ML ~~LOC~~ SOLN
0.0000 [IU] | Freq: Three times a day (TID) | SUBCUTANEOUS | Status: DC
  Administered 2016-05-19: 3 [IU] via SUBCUTANEOUS
  Administered 2016-05-19: 2 [IU] via SUBCUTANEOUS
  Administered 2016-05-20 – 2016-05-21 (×2): 5 [IU] via SUBCUTANEOUS
  Administered 2016-05-21: 3 [IU] via SUBCUTANEOUS
  Administered 2016-05-21: 2 [IU] via SUBCUTANEOUS
  Administered 2016-05-22: 3 [IU] via SUBCUTANEOUS
  Administered 2016-05-22: 2 [IU] via SUBCUTANEOUS
  Administered 2016-05-22: 3 [IU] via SUBCUTANEOUS
  Administered 2016-05-23: 2 [IU] via SUBCUTANEOUS
  Filled 2016-05-18: qty 2
  Filled 2016-05-18 (×3): qty 3
  Filled 2016-05-18: qty 2
  Filled 2016-05-18 (×2): qty 5
  Filled 2016-05-18: qty 3
  Filled 2016-05-18 (×2): qty 2

## 2016-05-18 MED ORDER — SODIUM CHLORIDE 0.9 % IV SOLN: 250.0000 mL | INTRAVENOUS | Status: DC | PRN

## 2016-05-18 MED ORDER — DEXTROSE 5 % IV SOLN: 2.0000 g | INTRAVENOUS | Status: DC

## 2016-05-18 NOTE — Progress Notes (Addendum)
Pharmacy Antibiotic Note  Alan Stafford is a 71 y.o. male admitted on 05/18/2016 with  pneumonia.  Pharmacy has been consulted for cefepime and vancomycin  dosing. Patient received vancomycin 1gm IV and cefepime 2gm IV in ED.   Plan: Ke: 0.046   Vd: 69   t1/2: 15  Will start patient on Vancomycin 1250mg  IV every 18 hours with 6 hours stack dosing. Calculated trough at Css is 15.4. Trough level ordered prior to 4th dose. Will monitor renal function and adjust dose as needed. MRSA PCR ordered- recommend discontinuation of vancomycin if negative.  Will start patient on cefepime 2gm IV every 12 hours based on current CrCl <2660ml/min, indication, and recent hospitalization.   Height: 6\' 2"  (188 cm) Weight: 220 lb (99.8 kg) IBW/kg (Calculated) : 82.2  Temp (24hrs), Avg:97.8 F (36.6 C), Min:97.8 F (36.6 C), Max:97.8 F (36.6 C)   Recent Labs Lab 05/18/16 1432  WBC 9.6  CREATININE 1.75*    Estimated Creatinine Clearance: 49.6 mL/min (A) (by C-G formula based on SCr of 1.75 mg/dL (H)).    Allergies  Allergen Reactions  . Statins Other (See Comments)    Leg weakness  . Sulfa Antibiotics Rash    Antimicrobials this admission: 3/24 cefepime >>  3/24 vancomycin >>   Dose adjustments this admission:  Microbiology results: 3/24  MRSA PCR:   Thank you for allowing pharmacy to be a part of this patient's care.  Gardner CandleSheema M Dayanna Pryce, PharmD, BCPS Clinical Pharmacist 05/18/2016 8:30 PM

## 2016-05-18 NOTE — ED Triage Notes (Signed)
States was diagnosed with pneumonia 1 1/2 weeks ago and was admitted to hospital for 4 days. States he did not ever feel he was back to his baseline but that has had increased weakness since discharge.

## 2016-05-18 NOTE — ED Provider Notes (Signed)
[facility] Emergency Department Provider Note  ____________________________________________  Time seen: Approximately 5:04 PM  I have reviewed the triage vital signs and the nursing notes.   HISTORY  Chief Complaint Weakness    HPI Alan Stafford is a 70 y.o. male who complains of generalized weakness and dyspnea on exertion and seems to be worsening over the past several weeks. He was discharged from the hospital about 3 weeks ago actually and initially felt better, but now is feeling worsened again. No chest pain. He is having some hemoptysis over the last 3 days. No syncope.  During his hospitalization history of for bilateral pneumonia and sepsis. He had an echocardiogram which revealed reduced EF of about 35%. Patient denies orthopnea or PND.  Past Medical History:  Diagnosis Date  . A-fib (HCC)   . Abnormal heart rhythm   . Arthritis   . Asthma   . Diabetes mellitus without complication (HCC)   . GERD (gastroesophageal reflux disease)   . Hypercholesteremia   . Hypertension   . Hyperthyroidism   . Kidney stones      Patient Active Problem List   Diagnosis Date Noted  . Sepsis (HCC) 04/22/2016  . CAP (community acquired pneumonia) 04/22/2016  . Hemoptysis 04/22/2016  . Diabetes mellitus (HCC) 08/17/2015  . Cervical facet syndrome (Bilateral) (R>L) 08/17/2015  . Osteoarthritis of knee (Left) 08/17/2015  . Osteoarthritis of hip (Left) 08/17/2015  . Chronic low back pain (Location of Secondary source of pain) (Bilateral) (R>L) 08/17/2015  . Encounter for therapeutic drug level monitoring 01/17/2015  . Encounter for chronic pain management 01/17/2015  . Chronic pain 01/03/2015  . Lumbar spondylosis 01/03/2015  . CAD in native artery 01/03/2015  . Acid reflux 01/03/2015  . Cardiac murmur 01/03/2015  . Hypertriglyceridemia 01/03/2015  . Angina pectoris (HCC) 01/03/2015  . Long term current use of anticoagulant therapy (Coumadin) 01/03/2015   . Bulging lumbar disc (T12-L1, L1-2, L2-3, and L4-5) 01/03/2015  . Lumbar foraminal stenosis (severe right L4-5) 01/03/2015  . Chronic lower extremity pain (Location of Primary Source of Pain) (Bilateral) (R>L) 01/03/2015  . Chronic hip pain (Right) 01/03/2015  . Chronic lumbar radicular pain (Right) 01/03/2015  . Myofascial pain syndrome (right suprascapular muscle) 01/03/2015  . Chronic neck pain (Bilateral) (R>L) 01/03/2015  . Chronic shoulder pain (Right) 01/03/2015  . Cervical spondylosis 01/03/2015  . Chronic cervical radicular pain (Right) 01/03/2015  . Chronic knee pain (Location of Tertiary source of pain) (Left) 01/03/2015  . Long term current use of opiate analgesic 01/03/2015  . Long term prescription opiate use 01/03/2015  . Opiate use 01/03/2015  . Thoracic compression fracture (HCC) (T3, T4, and T8) 01/03/2015  . Cervical facet hypertrophy (Bilateral C3-4) 01/03/2015  . Cervical nerve root disorder 01/03/2015  . Compression fracture of thoracic vertebra (HCC) 01/03/2015  . Atrial fibrillation (HCC) 04/21/2014  . Hypotestosteronism 10/05/2013  . Encounter for screening for other disorder 07/05/2013  . Drug intolerance 07/05/2013  . Chronic kidney disease (CKD), stage III (moderate) 12/31/2012  . Diabetic nephropathy (HCC) 12/31/2012  . Airway hyperreactivity 09/30/2012  . Hyperthyroidism 09/30/2012  . Essential (primary) hypertension 09/25/2012  . Hypercholesterolemia 09/25/2012  . Paroxysmal atrial fibrillation (HCC) 09/25/2012  . Type 2 diabetes mellitus (HCC) 09/25/2012     Past Surgical History:  Procedure Laterality Date  . KNEE SURGERY Right      Prior to Admission medications   Medication Sig Start Date End Date Taking? Authorizing Provider  albuterol (PROVENTIL HFA;VENTOLIN HFA) 108 (90  Base) MCG/ACT inhaler Inhale 1 puff into the lungs every 6 (six) hours as needed for wheezing or shortness of breath.    Yes Historical Provider, MD  aspirin EC 81 MG  tablet Take 81 mg by mouth. 01/01/11  Yes Historical Provider, MD  clotrimazole-betamethasone (LOTRISONE) cream Apply to affected area 2 times daily 06/26/15 06/22/16 Yes Historical Provider, MD  enalapril (VASOTEC) 10 MG tablet Take 5 mg by mouth daily.   Yes Historical Provider, MD  escitalopram (LEXAPRO) 20 MG tablet Take 40 mg by mouth.  04/18/15  Yes Historical Provider, MD  flecainide (TAMBOCOR) 100 MG tablet Take 100 mg by mouth 2 (two) times daily.   Yes Historical Provider, MD  HYDROcodone-acetaminophen (NORCO) 10-325 MG tablet Take 2 tablets by mouth every 6 (six) hours as needed for pain.   Yes Historical Provider, MD  metFORMIN (GLUCOPHAGE) 500 MG tablet Take 500 mg by mouth 3 (three) times daily with meals.    Yes Historical Provider, MD  methimazole (TAPAZOLE) 10 MG tablet Take 10 mg by mouth daily.    Yes Historical Provider, MD  metoprolol succinate (TOPROL-XL) 25 MG 24 hr tablet Take 25 mg by mouth daily.   Yes Historical Provider, MD  montelukast (SINGULAIR) 10 MG tablet Take 10 mg by mouth at bedtime.    Yes Historical Provider, MD  nitroGLYCERIN (NITROSTAT) 0.4 MG SL tablet Place 0.4 mg under the tongue every 5 (five) minutes as needed for chest pain.    Yes Historical Provider, MD  omeprazole (PRILOSEC) 20 MG capsule Take 20 mg by mouth daily.    Yes Historical Provider, MD  pregabalin (LYRICA) 100 MG capsule Take 100 mg by mouth 2 (two) times daily.   Yes Historical Provider, MD  sildenafil (REVATIO) 20 MG tablet TAKE ONE TO TWO TABLETS BY MOUTH ONCE DAILY AS NEEDED *DO NOT TAKE NITROGLYCERIN AFTERWARD* 03/14/16  Yes Historical Provider, MD  SYMBICORT 160-4.5 MCG/ACT inhaler Inhale 2 puffs into the lungs.  03/05/16  Yes Historical Provider, MD  testosterone cypionate (DEPOTESTOSTERONE CYPIONATE) 200 MG/ML injection Inject 400 mg into the muscle every 14 (fourteen) days.    Yes Historical Provider, MD  warfarin (COUMADIN) 5 MG tablet Take 5 mg by mouth daily.   Yes Historical Provider,  MD  albuterol (PROVENTIL HFA;VENTOLIN HFA) 108 (90 Base) MCG/ACT inhaler Inhale into the lungs. 04/07/15 04/06/16  Historical Provider, MD  Blood Glucose Monitoring Suppl (FIFTY50 GLUCOSE METER 2.0) w/Device KIT 1 each by Other route once. Use as instructed to check fasting BS daily.   Glucometer ; One-Touch    Historical Provider, MD  glucose blood (ACCU-CHEK COMFORT CURVE) test strip  12/27/09   Historical Provider, MD  glucose blood (ONE TOUCH ULTRA TEST) test strip USE AS DIRECTED 07/20/13   Historical Provider, MD  guaiFENesin-dextromethorphan (ROBITUSSIN DM) 100-10 MG/5ML syrup Take 5 mLs by mouth every 4 (four) hours as needed for cough. Patient not taking: Reported on 05/18/2016 04/25/16   Hillary Bow, MD     Allergies Statins and Sulfa antibiotics   Family History  Problem Relation Age of Onset  . Dementia Mother   . Heart disease Father     Social History Social History  Substance Use Topics  . Smoking status: Never Smoker  . Smokeless tobacco: Never Used  . Alcohol use No    Review of Systems  Constitutional:   No fever or chills.  ENT:   No sore throat. No rhinorrhea. Cardiovascular:   No chest pain. Respiratory:   Positive  shortness of breath and hemoptysis. Gastrointestinal:   Negative for abdominal pain, vomiting and diarrhea.  Genitourinary:   Negative for dysuria or difficulty urinating. Musculoskeletal:   Negative for focal pain or swelling Neurological:   Negative for headaches 10-point ROS otherwise negative.  ____________________________________________   PHYSICAL EXAM:  VITAL SIGNS: ED Triage Vitals  Enc Vitals Group     BP 05/18/16 1427 119/85     Pulse Rate 05/18/16 1427 (!) 104     Resp 05/18/16 1427 20     Temp 05/18/16 1427 97.8 F (36.6 C)     Temp Source 05/18/16 1427 Oral     SpO2 05/18/16 1427 94 %     Weight 05/18/16 1428 220 lb (99.8 kg)     Height 05/18/16 1428 '6\' 2"'$  (1.88 m)     Head Circumference --      Peak Flow --       Pain Score --      Pain Loc --      Pain Edu? --      Excl. in [place]? --     Vital signs reviewed, nursing assessments reviewed.   Constitutional:   Alert and oriented. Well appearing and in no distress. Eyes:   No scleral icterus. No conjunctival pallor. PERRL. EOMI.  No nystagmus. ENT   Head:   Normocephalic and atraumatic.   Nose:   No congestion/rhinnorhea. No septal hematoma   Mouth/Throat:   MMM, no pharyngeal erythema. No peritonsillar mass.    Neck:   No stridor. No SubQ emphysema. No meningismus.No JVD Hematological/Lymphatic/Immunilogical:   No cervical lymphadenopathy. Cardiovascular:   RRR. Symmetric bilateral radial and DP pulses.  No murmurs.  Respiratory:  Diffuse inspiratory crackles, symmetric breath sounds bilaterally. Good air movement. Gastrointestinal:   Soft and nontender. Non distended. There is no CVA tenderness.  No rebound, rigidity, or guarding. Genitourinary:   deferred Musculoskeletal:   Normal range of motion in all extremities. No joint effusions.  No lower extremity tenderness.  No edema. Neurologic:   Normal speech and language.  CN 2-10 normal. Motor grossly intact. No gross focal neurologic deficits are appreciated.  Skin:    Skin is warm, dry and intact. No rash noted.  No petechiae, purpura, or bullae.  ____________________________________________    LABS (pertinent positives/negatives) (all labs ordered are listed, but only abnormal results are displayed) Labs Reviewed  CBC - Abnormal; Notable for the following:       Result Value   Hemoglobin 12.0 (*)    HCT 36.1 (*)    MCV 70.2 (*)    MCH 23.3 (*)    RDW 19.9 (*)    All other components within normal limits  URINALYSIS, COMPLETE (UACMP) WITH MICROSCOPIC - Abnormal; Notable for the following:    Color, Urine AMBER (*)    APPearance CLEAR (*)    Protein, ur 30 (*)    All other components within normal limits  COMPREHENSIVE METABOLIC PANEL - Abnormal; Notable for the  following:    Sodium 132 (*)    Potassium 5.2 (*)    Glucose, Bld 221 (*)    BUN 28 (*)    Creatinine, Ser 1.75 (*)    Calcium 8.6 (*)    Albumin 3.3 (*)    AST 53 (*)    Total Bilirubin 1.3 (*)    GFR calc non Af Amer 38 (*)    GFR calc Af Amer 44 (*)    All other components within normal limits  PROTIME-INR -  Abnormal; Notable for the following:    Prothrombin Time 27.7 (*)    All other components within normal limits  TROPONIN I   ____________________________________________   EKG  Interpreted by me Sinus rhythm rate of 94, left axis, left bundle branch block. No acute ischemic changes.  ____________________________________________    [id number]  Dg Chest 2 View  Result Date: 05/18/2016 CLINICAL DATA:  Shortness of breath and hemoptysis EXAM: CHEST  2 VIEW COMPARISON:  Chest radiograph April 22, 2016 and chest CT April 22, 2016. FINDINGS: There is bilateral interstitial pulmonary edema. Patchy airspace opacity is noted in the right upper lobe as well as in both lower lung zone regions. There is cardiomegaly with pulmonary venous hypertension. There is no adenopathy. No bone lesions are evident. IMPRESSION: Findings most consistent with congestive heart failure with areas of superimposed alveolar edema versus pneumonitis in the right upper and both lower lung zone regions. There may be both alveolar edema and pneumonia given this appearance. No adenopathy evident. Electronically Signed   By: Lowella Grip III M.D.   On: 05/18/2016 15:04   Ct Angio Chest Pe W And/or Wo Contrast  Result Date: 05/18/2016 CLINICAL DATA:  Shortness of breath over the last 2 days. Recently hospitalized for pneumonia. EXAM: CT ANGIOGRAPHY CHEST WITH CONTRAST TECHNIQUE: Multidetector CT imaging of the chest was performed using the standard protocol during bolus administration of intravenous contrast. Multiplanar CT image reconstructions and MIPs were obtained to evaluate the vascular anatomy.  CONTRAST:  60 cc Isovue 370 COMPARISON:  Chest x-ray 05/18/2016, chest CT 04/22/2016 FINDINGS: Cardiovascular: Heart is enlarged. Three-vessel coronary artery disease is present. No pericardial effusion.Pulmonary arteries are well opacified. There is no acute pulmonary embolus. There is atherosclerotic calcification of the aortic arch. Aortic arch measures 3.9 cm maximum diameter. Mediastinum/Nodes: There is significant mediastinal and hilar adenopathy. Some of the mediastinal lymph nodes are calcified or partially calcified. Left hilar node is 1.3 cm. Subcarinal node is 2.3 cm. Right paratracheal is 1.4 cm. Lower right paratracheal node is 1.3 cm. Prevascular lymph node is 1.4 cm. Accounting for slight differences in measurements, there has been no change since the prior CT exam. Lungs/Pleura: There are small bilateral pleural effusions. There are focal areas of ground-glass density throughout the lungs, some which appear confluent. Ground-glass densities have progressed since the prior study with more central distribution identified than on the prior study. Upper Abdomen: Hiatal hernia is present. Musculoskeletal: Chronic compression fractures of T3 and T4, stable in appearance. Review of the MIP images confirms the above findings. IMPRESSION: 1. No evidence for acute pulmonary embolus. 2. Cardiomegaly and coronary artery disease. 3. Dilated thoracic aortic arch. Recommend annual imaging followup by CTA or MRA. This recommendation follows 2010 ACCF/AHA/AATS/ACR/ASA/SCA/SCAI/SIR/STS/SVM Guidelines for the Diagnosis and Management of Patients with Thoracic Aortic Disease. Circulation.2010; 121: J093-O671 4. Interval progression of ground-glass opacities throughout the lungs, associated with bilateral pleural effusions and adenopathy. Considerations include typical or atypical pneumonia, sarcoidosis, fungal infection, lymphoproliferative disease, or metastatic disease associated with inflammation/infection. 5.  Chronic compression fractures of T3 and T4. 6. Hiatal hernia. Electronically Signed   By: Nolon Nations M.D.   On: 05/18/2016 17:49    ____________________________________________   PROCEDURES Procedures  ____________________________________________   INITIAL IMPRESSION / ASSESSMENT AND PLAN / ED COURSE  Pertinent labs & imaging results that were available during my care of the patient were reviewed by me and considered in my medical decision making (see chart for details).  Patient presents with generalized weakness  and dyspnea on exertion. Chest x-ray and exam suggestive of mild congestive heart failure versus refractory pneumonia. Patient is afebrile with normal white blood cell count. I'll get a CT angiogram of the chest to evaluate for PE with his recent hospitalization and further evaluate the lab findings.      ----------------------------------------- 6:51 PM on 05/18/2016 -----------------------------------------  CT shows worsening ground glass opacities, consistent with progressing multifocal infection. Symptoms and presentation is not consistent with congestive heart failure. IV cefepime and vancomycin, case discussed with hospitalist for further management.      ____________________________________________   FINAL CLINICAL IMPRESSION(S) / ED DIAGNOSES  Final diagnoses:  HCAP (healthcare-associated pneumonia)  Generalized weakness      New Prescriptions   No medications on file     Portions of this note were generated with dragon dictation software. Dictation errors may occur despite best attempts at proofreading.    Carrie Mew, MD 05/18/16 [phone]

## 2016-05-18 NOTE — H&P (Signed)
Alan Stafford is an 71 y.o. male.   Chief Complaint: Shortness of breath. HPI: This is a 71 year old male who was just discharged from the hospital about a week ago following a stay for pneumonia. He finished his full course of Levaquin. He said he never felt better after being discharged. Denies he has began to feel worse last couple days with increasing shortness of breath and some mild hemoptysis.  Past Medical History:  Diagnosis Date  . A-fib ([place])   . Abnormal heart rhythm   . Arthritis   . Asthma   . Diabetes mellitus without complication ([place])   . GERD (gastroesophageal reflux disease)   . Hypercholesteremia   . Hypertension   . Hyperthyroidism   . Kidney stones     Past Surgical History:  Procedure Laterality Date  . KNEE SURGERY Right     Family History  Problem Relation Age of Onset  . Dementia Mother   . Heart disease Father    Social History:  reports that he has never smoked. He has never used smokeless tobacco. He reports that he does not drink alcohol or use drugs.  Allergies:  Allergies  Allergen Reactions  . Statins Other (See Comments)    Leg weakness  . Sulfa Antibiotics Rash     (Not in a hospital admission)  Results for orders placed or performed during the hospital encounter of 05/18/16 (from the past 48 hour(s))  CBC     Status: Abnormal   Collection Time: 05/18/16  2:32 PM  Result Value Ref Range   WBC 9.6 3.8 - 10.6 K/uL   RBC 5.15 4.40 - 5.90 MIL/uL   Hemoglobin 12.0 (L) 13.0 - 18.0 g/dL   HCT 36.1 (L) 40.0 - 52.0 %   MCV 70.2 (L) 80.0 - 100.0 fL   MCH 23.3 (L) 26.0 - 34.0 pg   MCHC 33.3 32.0 - 36.0 g/dL   RDW 19.9 (H) 11.5 - 14.5 %   Platelets 226 150 - 440 K/uL  Comprehensive metabolic panel     Status: Abnormal   Collection Time: 05/18/16  2:32 PM  Result Value Ref Range   Sodium 132 (L) 135 - 145 mmol/L   Potassium 5.2 (H) 3.5 - 5.1 mmol/L   Chloride 102 101 - 111 mmol/L   CO2 22 22 - 32 mmol/L   Glucose, Bld 221 (H) 65 - 99  mg/dL   BUN 28 (H) 6 - 20 mg/dL   Creatinine, Ser 1.75 (H) 0.61 - 1.24 mg/dL   Calcium 8.6 (L) 8.9 - 10.3 mg/dL   Total Protein 7.5 6.5 - 8.1 g/dL   Albumin 3.3 (L) 3.5 - 5.0 g/dL   AST 53 (H) 15 - 41 U/L   ALT 37 17 - 63 U/L   Alkaline Phosphatase 72 38 - 126 U/L   Total Bilirubin 1.3 (H) 0.3 - 1.2 mg/dL   GFR calc non Af Amer 38 (L) >60 mL/min   GFR calc Af Amer 44 (L) >60 mL/min    Comment: (NOTE) The eGFR has been calculated using the CKD EPI equation. This calculation has not been validated in all clinical situations. eGFR's persistently <60 mL/min signify possible Chronic Kidney Disease.    Anion gap 8 5 - 15  Troponin I     Status: None   Collection Time: 05/18/16  2:32 PM  Result Value Ref Range   Troponin I <0.03 <0.03 ng/mL  Protime-INR     Status: Abnormal   Collection Time: 05/18/16  2:59 PM  Result Value Ref Range   Prothrombin Time 27.7 (H) 11.4 - 15.2 seconds   INR 2.53   Urinalysis, Complete w Microscopic     Status: Abnormal   Collection Time: 05/18/16  4:30 PM  Result Value Ref Range   Color, Urine AMBER (A) YELLOW    Comment: BIOCHEMICALS MAY BE AFFECTED BY COLOR   APPearance CLEAR (A) CLEAR   Specific Gravity, Urine 1.027 1.005 - 1.030   pH 5.0 5.0 - 8.0   Glucose, UA NEGATIVE NEGATIVE mg/dL   Hgb urine dipstick NEGATIVE NEGATIVE   Bilirubin Urine NEGATIVE NEGATIVE   Ketones, ur NEGATIVE NEGATIVE mg/dL   Protein, ur 30 (A) NEGATIVE mg/dL   Nitrite NEGATIVE NEGATIVE   Leukocytes, UA NEGATIVE NEGATIVE   RBC / HPF 0-5 0 - 5 RBC/hpf   WBC, UA 0-5 0 - 5 WBC/hpf   Bacteria, UA NONE SEEN NONE SEEN   Squamous Epithelial / LPF NONE SEEN NONE SEEN   Mucous PRESENT    Hyaline Casts, UA PRESENT    Dg Chest 2 View  Result Date: 05/18/2016 CLINICAL DATA:  Shortness of breath and hemoptysis EXAM: CHEST  2 VIEW COMPARISON:  Chest radiograph April 22, 2016 and chest CT April 22, 2016. FINDINGS: There is bilateral interstitial pulmonary edema. Patchy  airspace opacity is noted in the right upper lobe as well as in both lower lung zone regions. There is cardiomegaly with pulmonary venous hypertension. There is no adenopathy. No bone lesions are evident. IMPRESSION: Findings most consistent with congestive heart failure with areas of superimposed alveolar edema versus pneumonitis in the right upper and both lower lung zone regions. There may be both alveolar edema and pneumonia given this appearance. No adenopathy evident. Electronically Signed   By: Lowella Grip III M.D.   On: 05/18/2016 15:04   Ct Angio Chest Pe W And/or Wo Contrast  Result Date: 05/18/2016 CLINICAL DATA:  Shortness of breath over the last 2 days. Recently hospitalized for pneumonia. EXAM: CT ANGIOGRAPHY CHEST WITH CONTRAST TECHNIQUE: Multidetector CT imaging of the chest was performed using the standard protocol during bolus administration of intravenous contrast. Multiplanar CT image reconstructions and MIPs were obtained to evaluate the vascular anatomy. CONTRAST:  60 cc Isovue 370 COMPARISON:  Chest x-ray 05/18/2016, chest CT 04/22/2016 FINDINGS: Cardiovascular: Heart is enlarged. Three-vessel coronary artery disease is present. No pericardial effusion.Pulmonary arteries are well opacified. There is no acute pulmonary embolus. There is atherosclerotic calcification of the aortic arch. Aortic arch measures 3.9 cm maximum diameter. Mediastinum/Nodes: There is significant mediastinal and hilar adenopathy. Some of the mediastinal lymph nodes are calcified or partially calcified. Left hilar node is 1.3 cm. Subcarinal node is 2.3 cm. Right paratracheal is 1.4 cm. Lower right paratracheal node is 1.3 cm. Prevascular lymph node is 1.4 cm. Accounting for slight differences in measurements, there has been no change since the prior CT exam. Lungs/Pleura: There are small bilateral pleural effusions. There are focal areas of ground-glass density throughout the lungs, some which appear confluent.  Ground-glass densities have progressed since the prior study with more central distribution identified than on the prior study. Upper Abdomen: Hiatal hernia is present. Musculoskeletal: Chronic compression fractures of T3 and T4, stable in appearance. Review of the MIP images confirms the above findings. IMPRESSION: 1. No evidence for acute pulmonary embolus. 2. Cardiomegaly and coronary artery disease. 3. Dilated thoracic aortic arch. Recommend annual imaging followup by CTA or MRA. This recommendation follows 2010 ACCF/AHA/AATS/ACR/ASA/SCA/SCAI/SIR/STS/SVM Guidelines for the Diagnosis  and Management of Patients with Thoracic Aortic Disease. Circulation.2010; 121: U542-H062 4. Interval progression of ground-glass opacities throughout the lungs, associated with bilateral pleural effusions and adenopathy. Considerations include typical or atypical pneumonia, sarcoidosis, fungal infection, lymphoproliferative disease, or metastatic disease associated with inflammation/infection. 5. Chronic compression fractures of T3 and T4. 6. Hiatal hernia. Electronically Signed   By: Nolon Nations M.D.   On: 05/18/2016 17:49    Review of Systems  Constitutional: Negative for fever.  HENT: Negative for hearing loss.   Eyes: Negative for blurred vision.  Respiratory: Positive for cough and hemoptysis.   Cardiovascular: Negative for chest pain.  Gastrointestinal: Negative for nausea and vomiting.  Genitourinary: Negative for dysuria.  Musculoskeletal: Positive for joint pain.  Skin: Negative for rash.  Neurological: Negative for dizziness.  Psychiatric/Behavioral: Negative for depression.    Blood pressure 120/77, pulse 97, temperature 97.8 F (36.6 C), temperature source Oral, resp. rate (!) 28, height 6' 2"  (1.88 m), weight 99.8 kg (220 lb), SpO2 97 %. Physical Exam  Constitutional: He is oriented to person, place, and time. He appears well-developed and well-nourished. No distress.  HENT:  Head:  Normocephalic and atraumatic.  Mouth/Throat: Oropharynx is clear and moist.  Eyes: Pupils are equal, round, and reactive to light. Left eye exhibits no discharge. No scleral icterus.  Neck: Normal range of motion. Neck supple. No JVD present. No tracheal deviation present. No thyromegaly present.  Cardiovascular: Normal rate and regular rhythm.   Respiratory:  Very mild scattered rhonchi. No dullness to percussion. No use of accessory muscles.  GI: Soft. Bowel sounds are normal. There is no tenderness. There is no rebound.  Musculoskeletal: Normal range of motion. He exhibits no edema.  Lymphadenopathy:    He has no cervical adenopathy.  Neurological: He is alert and oriented to person, place, and time.  Skin: Skin is warm and dry.     Assessment/Plan 1. Pneumonia. Patient had CT scan in the ER. He has groundglass appearing patchy infiltrates. This is unusual image for pneumonia and is likely some type of atypical form. However differential could be significant. Will go ahead and restart him on antibiotics with cefepime and vancomycin. He may have some inflammation in his lungs that may be responsive to steroids. However we'll hold off until get opinion from pulmonary since he is not in respiratory distress. We'll go ahead and consult pulmonology as he may need bronchoscopy for final diagnosis. He did complain of some mild hemoptysis however he is on Coumadin for his atrial fibrillation and this just may be associated with cough. However he does have a brother who was recently diagnosed with lung cancer. 2. Atrial fibrillation. Rate is controlled and he is on Coumadin for anticoagulation will check INR. 3. Hypertension. Will continue current medications 4. Diabetes. We'll hold his Glucophage since she just had a contrasted CT. Will add sliding scale insulin.  Total time spent 45 minutes   Baxter Hire, MD 05/18/2016, 7:48 PM

## 2016-05-19 LAB — BASIC METABOLIC PANEL
Anion gap: 7 (ref 5–15)
BUN: 31 mg/dL — ABNORMAL HIGH (ref 6–20)
CHLORIDE: 101 mmol/L (ref 101–111)
CO2: 25 mmol/L (ref 22–32)
Calcium: 8.3 mg/dL — ABNORMAL LOW (ref 8.9–10.3)
Creatinine, Ser: 1.66 mg/dL — ABNORMAL HIGH (ref 0.61–1.24)
GFR calc Af Amer: 47 mL/min — ABNORMAL LOW (ref >60)
GFR, EST NON AFRICAN AMERICAN: 40 mL/min — AB (ref >60)
Glucose, Bld: 152 mg/dL — ABNORMAL HIGH (ref 65–99)
POTASSIUM: 4.2 mmol/L (ref 3.5–5.1)
Sodium: 133 mmol/L — ABNORMAL LOW (ref 135–145)

## 2016-05-19 LAB — EXPECTORATED SPUTUM ASSESSMENT W GRAM STAIN, RFLX TO RESP C

## 2016-05-19 LAB — CBC
HEMATOCRIT: 36.2 % — AB (ref 40.0–52.0)
Hemoglobin: 11.7 g/dL — ABNORMAL LOW (ref 13.0–18.0)
MCH: 22.7 pg — ABNORMAL LOW (ref 26.0–34.0)
MCHC: 32.3 g/dL (ref 32.0–36.0)
MCV: 70.4 fL — AB (ref 80.0–100.0)
PLATELETS: 222 10*3/uL (ref 150–440)
RBC: 5.15 MIL/uL (ref 4.40–5.90)
RDW: 19.7 % — ABNORMAL HIGH (ref 11.5–14.5)
WBC: 9.4 10*3/uL (ref 3.8–10.6)

## 2016-05-19 LAB — GLUCOSE, CAPILLARY
GLUCOSE-CAPILLARY: 100 mg/dL — AB (ref 65–99)
GLUCOSE-CAPILLARY: 135 mg/dL — AB (ref 65–99)
Glucose-Capillary: 132 mg/dL — ABNORMAL HIGH (ref 65–99)

## 2016-05-19 LAB — EXPECTORATED SPUTUM ASSESSMENT W REFEX TO RESP CULTURE

## 2016-05-19 LAB — PROTIME-INR
INR: 2.63
Prothrombin Time: 28.6 seconds — ABNORMAL HIGH (ref 11.4–15.2)

## 2016-05-19 LAB — MAGNESIUM: Magnesium: 1.9 mg/dL (ref 1.7–2.4)

## 2016-05-19 MED ORDER — METOPROLOL SUCCINATE ER 25 MG PO TB24: 25.0000 mg | ORAL_TABLET | Freq: Two times a day (BID) | ORAL | Status: DC

## 2016-05-19 MED ORDER — FUROSEMIDE 10 MG/ML IJ SOLN
80.0000 mg | Freq: Once | INTRAMUSCULAR | Status: AC
  Administered 2016-05-19: 80 mg via INTRAVENOUS
  Filled 2016-05-19: qty 8

## 2016-05-19 MED ORDER — METOPROLOL SUCCINATE ER 25 MG PO TB24
25.0000 mg | ORAL_TABLET | Freq: Two times a day (BID) | ORAL | Status: DC
  Administered 2016-05-20 – 2016-05-21 (×3): 25 mg via ORAL
  Filled 2016-05-19 (×4): qty 1

## 2016-05-19 MED ORDER — ZOLPIDEM TARTRATE 5 MG PO TABS
5.0000 mg | ORAL_TABLET | Freq: Every evening | ORAL | Status: DC | PRN
Start: 2016-05-19 — End: 2016-05-23
  Administered 2016-05-19 – 2016-05-22 (×4): 5 mg via ORAL
  Filled 2016-05-19 (×4): qty 1

## 2016-05-19 MED ORDER — VANCOMYCIN HCL 10 G IV SOLR
1250.0000 mg | INTRAVENOUS | Status: DC
  Filled 2016-05-19: qty 1250

## 2016-05-19 MED ORDER — ALUM & MAG HYDROXIDE-SIMETH 200-200-20 MG/5ML PO SUSP
15.0000 mL | Freq: Four times a day (QID) | ORAL | Status: DC | PRN
  Administered 2016-05-19: 15 mL via ORAL
  Filled 2016-05-19: qty 30

## 2016-05-19 MED ORDER — METOPROLOL TARTRATE 25 MG PO TABS
25.0000 mg | ORAL_TABLET | Freq: Once | ORAL | Status: AC
  Administered 2016-05-19: 25 mg via ORAL
  Filled 2016-05-19: qty 1

## 2016-05-19 MED ORDER — PANTOPRAZOLE SODIUM 40 MG PO TBEC
40.0000 mg | DELAYED_RELEASE_TABLET | Freq: Two times a day (BID) | ORAL | Status: DC
  Administered 2016-05-19 – 2016-05-20 (×2): 40 mg via ORAL
  Filled 2016-05-19 (×2): qty 1

## 2016-05-19 NOTE — Progress Notes (Signed)
[facility] - [place] at [facility]   PATIENT NAME: Alan Stafford    MR#:  [id number]  DATE OF BIRTH:  [birth year]  SUBJECTIVE:seen at bedside. patient admitted for recurrent pneumonia.   CHIEF COMPLAINT:   Chief Complaint  Patient presents with  . Weakness    REVIEW OF SYSTEMS:    ROS  Nutrition: full Tolerating Diet: Tolerating PT:      DRUG ALLERGIES:   Allergies  Allergen Reactions  . Statins Other (See Comments)    Leg weakness  . Sulfa Antibiotics Rash    VITALS:  Blood pressure 113/79, pulse 89, temperature 98 F (36.7 C), temperature source Oral, resp. rate 18, height 6\' 2"  (1.88 m), weight 97.1 kg (214 lb 1.6 oz), SpO2 91 %.  PHYSICAL EXAMINATION:   Physical Exam  GENERAL:  72 y.o.-year-old patient lying in the bed with no acute distress.  EYES: Pupils equal, round, reactive to light and accommodation. No scleral icterus. Extraocular muscles intact.  HEENT: Head atraumatic, normocephalic. Oropharynx and nasopharynx clear.  NECK:  Supple, no jugular venous distention. No thyroid enlargement, no tenderness.  LUNGS: Normal breath sounds bilaterally, no wheezing, rales,rhonchi or crepitation. No use of accessory muscles of respiration.  CARDIOVASCULAR: S1, S2 normal. No murmurs, rubs, or gallops.  ABDOMEN: Soft, nontender, nondistended. Bowel sounds present. No organomegaly or mass.  EXTREMITIES: No pedal edema, cyanosis, or clubbing.  NEUROLOGIC: Cranial nerves II through XII are intact. Muscle strength 5/5 in all extremities. Sensation intact. Gait not checked.  PSYCHIATRIC: The patient is alert and oriented x 3.  SKIN: No obvious rash, lesion, or ulcer.    LABORATORY PANEL:   CBC  Recent Labs Lab 05/19/16 0407  WBC 9.4  HGB 11.7*  HCT 36.2*  PLT 222   ------------------------------------------------------------------------------------------------------------------  Chemistries   Recent Labs Lab 05/18/16 1432  05/19/16 0407  NA 132* 133*  K 5.2* 4.2  CL 102 101  CO2 22 25  GLUCOSE 221* 152*  BUN 28* 31*  CREATININE 1.75* 1.66*  CALCIUM 8.6* 8.3*  AST 53*  --   ALT 37  --   ALKPHOS 72  --   BILITOT 1.3*  --    ------------------------------------------------------------------------------------------------------------------  Cardiac Enzymes  Recent Labs Lab 05/18/16 1432  TROPONINI <0.03   ------------------------------------------------------------------------------------------------------------------  RADIOLOGY:  Dg Chest 2 View  Result Date: 05/18/2016 CLINICAL DATA:  Shortness of breath and hemoptysis EXAM: CHEST  2 VIEW COMPARISON:  Chest radiograph April 22, 2016 and chest CT April 22, 2016. FINDINGS: There is bilateral interstitial pulmonary edema. Patchy airspace opacity is noted in the right upper lobe as well as in both lower lung zone regions. There is cardiomegaly with pulmonary venous hypertension. There is no adenopathy. No bone lesions are evident. IMPRESSION: Findings most consistent with congestive heart failure with areas of superimposed alveolar edema versus pneumonitis in the right upper and both lower lung zone regions. There may be both alveolar edema and pneumonia given this appearance. No adenopathy evident. Electronically Signed   By: Bretta Bang III M.D.   On: 05/18/2016 15:04   Ct Angio Chest Pe W And/or Wo Contrast  Result Date: 05/18/2016 CLINICAL DATA:  Shortness of breath over the last 2 days. Recently hospitalized for pneumonia. EXAM: CT ANGIOGRAPHY CHEST WITH CONTRAST TECHNIQUE: Multidetector CT imaging of the chest was performed using the standard protocol during bolus administration of intravenous contrast. Multiplanar CT image reconstructions and MIPs were obtained to evaluate the vascular anatomy. CONTRAST:  60 cc Isovue 370  COMPARISON:  Chest x-ray 05/18/2016, chest CT 04/22/2016 FINDINGS: Cardiovascular: Heart is enlarged. Three-vessel  coronary artery disease is present. No pericardial effusion.Pulmonary arteries are well opacified. There is no acute pulmonary embolus. There is atherosclerotic calcification of the aortic arch. Aortic arch measures 3.9 cm maximum diameter. Mediastinum/Nodes: There is significant mediastinal and hilar adenopathy. Some of the mediastinal lymph nodes are calcified or partially calcified. Left hilar node is 1.3 cm. Subcarinal node is 2.3 cm. Right paratracheal is 1.4 cm. Lower right paratracheal node is 1.3 cm. Prevascular lymph node is 1.4 cm. Accounting for slight differences in measurements, there has been no change since the prior CT exam. Lungs/Pleura: There are small bilateral pleural effusions. There are focal areas of ground-glass density throughout the lungs, some which appear confluent. Ground-glass densities have progressed since the prior study with more central distribution identified than on the prior study. Upper Abdomen: Hiatal hernia is present. Musculoskeletal: Chronic compression fractures of T3 and T4, stable in appearance. Review of the MIP images confirms the above findings. IMPRESSION: 1. No evidence for acute pulmonary embolus. 2. Cardiomegaly and coronary artery disease. 3. Dilated thoracic aortic arch. Recommend annual imaging followup by CTA or MRA. This recommendation follows 2010 ACCF/AHA/AATS/ACR/ASA/SCA/SCAI/SIR/STS/SVM Guidelines for the Diagnosis and Management of Patients with Thoracic Aortic Disease. Circulation.2010; 121: [id number] 4. Interval progression of ground-glass opacities throughout the lungs, associated with bilateral pleural effusions and adenopathy. Considerations include typical or atypical pneumonia, sarcoidosis, fungal infection, lymphoproliferative disease, or metastatic disease associated with inflammation/infection. 5. Chronic compression fractures of T3 and T4. 6. Hiatal hernia. Electronically Signed   By: Norva PavlovElizabeth  Brown M.D.   On: 05/18/2016 17:49      ASSESSMENT AND PLAN:   Active Problems:   Pneumonia 1.recurrent pneumonia: CT chest showed groundglass opacity infiltrates: Patient will be seen by pulmonary for possible atypical pneumonia Continue cefepime,vancomycin  #2 atrial fibrillation: Rate controlled, continue Coumadin #3 essential hypertension: Continue current medicine.continue Flecainide, #4 history of diabetes mellitus type 2: Hold metformin because of recent head contrasted CAT scan. #5 depression: Lexapro       All the records are reviewed and case discussed with Care Management/Social Workerr. Management plans discussed with the patient, family and they are in agreement.  CODE STATUS:full  TOTAL TIME TAKING CARE OF THIS PATIENT: 35 minutes.   POSSIBLE D/C IN 1-2DAYS, DEPENDING ON CLINICAL CONDITION.   Katha HammingKONIDENA,Suvan Stcyr M.D on 05/19/2016 at 9:27 AM  Between 7am to 6pm - Pager - [phone]  After 6pm go to [email or web address] - password [email or web address] [facility]  [facility]  Office  [phone]  CC: Primary care physician; Lindwood QuaHOFFMAN,BYRON, MD

## 2016-05-19 NOTE — Progress Notes (Signed)
Patient HR up to 160's patient was using the bathroom at the time. MD notified and ordered for RN to give metoprolol night time dose early.   Harvie HeckMelanie Eveny Anastas, RN

## 2016-05-19 NOTE — Progress Notes (Signed)
Patient HR elevated to 142, MD notified will put in orders if needed.  Alan HeckMelanie Stafford Delia, RN

## 2016-05-19 NOTE — Progress Notes (Signed)
Pulmonology spoke to patient this evening. Pt states he understands the plan of care. States he "feels much better." HR increases to 140s at times when walking around nursing station. Pt asked to limit activity until the cardiologist is seen. VSS. Will continue to monitor.

## 2016-05-19 NOTE — Progress Notes (Addendum)
Patient had 2 7 beat runs of vtach within a 15min from one another. MD notified. MD will place orders if needed. Patient asymptomatic, eating snack at the time.  Harvie HeckMelanie Krystine Pabst, RN

## 2016-05-19 NOTE — Plan of Care (Signed)
Problem: Respiratory: Goal: Respiratory status will improve Outcome: Progressing Patient not complaining of any more SOB or pain.   Alan Stafford

## 2016-05-19 NOTE — Progress Notes (Signed)
Alert and oriented. Independent in room . Ambulates to bathroom.  Has chronic back pain, medicated x1. Vital signs stable. No acute distress noted,. Staff will continue to  monitor

## 2016-05-19 NOTE — Progress Notes (Signed)
After review of VS and previous ECHO and Current Scans, with NL WBC count, this suggests Acute cardiac pulm edema from CHF NOT pneumonia.  Recommend Stopping ABX and aggressive IV lasix.   Will follow along.  Full consult to follow.

## 2016-05-20 ENCOUNTER — Encounter: Payer: Self-pay | Admitting: Internal Medicine

## 2016-05-20 ENCOUNTER — Inpatient Hospital Stay: Payer: Medicare Other

## 2016-05-20 DIAGNOSIS — R918 Other nonspecific abnormal finding of lung field: Secondary | ICD-10-CM

## 2016-05-20 DIAGNOSIS — I5022 Chronic systolic (congestive) heart failure: Secondary | ICD-10-CM

## 2016-05-20 DIAGNOSIS — J9601 Acute respiratory failure with hypoxia: Secondary | ICD-10-CM

## 2016-05-20 LAB — PROTIME-INR
INR: 2.14
Prothrombin Time: 24.3 seconds — ABNORMAL HIGH (ref 11.4–15.2)

## 2016-05-20 LAB — BASIC METABOLIC PANEL
Anion gap: 12 (ref 5–15)
BUN: 36 mg/dL — AB (ref 6–20)
CO2: 28 mmol/L (ref 22–32)
CREATININE: 1.94 mg/dL — AB (ref 0.61–1.24)
Calcium: 9 mg/dL (ref 8.9–10.3)
Chloride: 99 mmol/L — ABNORMAL LOW (ref 101–111)
GFR, EST AFRICAN AMERICAN: 39 mL/min — AB (ref >60)
GFR, EST NON AFRICAN AMERICAN: 33 mL/min — AB (ref >60)
Glucose, Bld: 137 mg/dL — ABNORMAL HIGH (ref 65–99)
Potassium: 4.7 mmol/L (ref 3.5–5.1)
SODIUM: 139 mmol/L (ref 135–145)

## 2016-05-20 LAB — GLUCOSE, CAPILLARY
GLUCOSE-CAPILLARY: 217 mg/dL — AB (ref 65–99)
Glucose-Capillary: 119 mg/dL — ABNORMAL HIGH (ref 65–99)
Glucose-Capillary: 231 mg/dL — ABNORMAL HIGH (ref 65–99)
Glucose-Capillary: 222 mg/dL — ABNORMAL HIGH (ref 65–99)

## 2016-05-20 LAB — SEDIMENTATION RATE: SED RATE: 5 mm/h (ref 0–20)

## 2016-05-20 LAB — PHOSPHORUS: Phosphorus: 5.1 mg/dL — ABNORMAL HIGH (ref 2.5–4.6)

## 2016-05-20 LAB — BRAIN NATRIURETIC PEPTIDE
B NATRIURETIC PEPTIDE 5: 547 pg/mL — AB (ref 0.0–100.0)
B NATRIURETIC PEPTIDE 5: 418 pg/mL — AB (ref 0.0–100.0)

## 2016-05-20 LAB — TSH: TSH: 0.474 u[IU]/mL (ref 0.350–4.500)

## 2016-05-20 LAB — MAGNESIUM: Magnesium: 2.2 mg/dL (ref 1.7–2.4)

## 2016-05-20 LAB — PROCALCITONIN: Procalcitonin: <0.1 ng/mL

## 2016-05-20 MED ORDER — WARFARIN SODIUM 2.5 MG PO TABS: 5.0000 mg | ORAL_TABLET | Freq: Every day | ORAL | Status: DC

## 2016-05-20 MED ORDER — IPRATROPIUM-ALBUTEROL 0.5-2.5 (3) MG/3ML IN SOLN
3.0000 mL | RESPIRATORY_TRACT | Status: DC | PRN
  Administered 2016-05-21: 3 mL via RESPIRATORY_TRACT
  Filled 2016-05-20: qty 3

## 2016-05-20 MED ORDER — NICOTINE 14 MG/24HR TD PT24
14.0000 mg | MEDICATED_PATCH | Freq: Every day | TRANSDERMAL | Status: DC
  Administered 2016-05-20 – 2016-05-23 (×4): 14 mg via TRANSDERMAL
  Filled 2016-05-20 (×4): qty 1

## 2016-05-20 MED ORDER — IPRATROPIUM-ALBUTEROL 0.5-2.5 (3) MG/3ML IN SOLN
3.0000 mL | Freq: Four times a day (QID) | RESPIRATORY_TRACT | Status: DC
  Administered 2016-05-20: 3 mL via RESPIRATORY_TRACT
  Filled 2016-05-20: qty 3

## 2016-05-20 MED ORDER — PANTOPRAZOLE SODIUM 40 MG PO TBEC
40.0000 mg | DELAYED_RELEASE_TABLET | Freq: Every day | ORAL | Status: DC
  Administered 2016-05-21 – 2016-05-23 (×3): 40 mg via ORAL
  Filled 2016-05-20 (×3): qty 1

## 2016-05-20 MED ORDER — FUROSEMIDE 10 MG/ML IJ SOLN: 20.0000 mg | Freq: Two times a day (BID) | INTRAMUSCULAR | Status: DC

## 2016-05-20 MED ORDER — AZITHROMYCIN 500 MG PO TABS
500.0000 mg | ORAL_TABLET | Freq: Every day | ORAL | Status: DC
  Administered 2016-05-20 – 2016-05-22 (×3): 500 mg via ORAL
  Filled 2016-05-20 (×3): qty 1

## 2016-05-20 MED ORDER — FUROSEMIDE 10 MG/ML IJ SOLN
40.0000 mg | Freq: Two times a day (BID) | INTRAMUSCULAR | Status: DC
  Administered 2016-05-20 – 2016-05-21 (×2): 40 mg via INTRAVENOUS
  Filled 2016-05-20 (×2): qty 4

## 2016-05-20 NOTE — Progress Notes (Signed)
[facility] - [place] [facility]   PATIENT NAME: Alan Stafford    MR#:  [id number]  DATE OF BIRTH:  05-08-1945  SUBJECTIVE  CHIEF COMPLAINT:   Chief Complaint  Patient presents with  . Weakness   Feels better with breathing. Has bright blood and dark clots in his sputum. Afebrile.  REVIEW OF SYSTEMS:    Review of Systems  Constitutional: Positive for malaise/fatigue. Negative for chills and fever.  HENT: Negative for sore throat.   Eyes: Negative for blurred vision, double vision and pain.  Respiratory: Positive for cough, hemoptysis and shortness of breath. Negative for wheezing.   Cardiovascular: Negative for chest pain, palpitations, orthopnea and leg swelling.  Gastrointestinal: Negative for abdominal pain, constipation, diarrhea, heartburn, nausea and vomiting.  Genitourinary: Negative for dysuria and hematuria.  Musculoskeletal: Negative for back pain and joint pain.  Skin: Negative for rash.  Neurological: Negative for sensory change, speech change, focal weakness and headaches.  Endo/Heme/Allergies: Does not bruise/bleed easily.  Psychiatric/Behavioral: Negative for depression. The patient is not nervous/anxious.    DRUG ALLERGIES:   Allergies  Allergen Reactions  . Statins Other (See Comments)    Leg weakness  . Sulfa Antibiotics Rash    VITALS:  Blood pressure 123/70, pulse (!) 56, temperature 98 F (36.7 C), temperature source Oral, resp. rate 19, height 6\' 2"  (1.88 m), weight 97.1 kg (214 lb 1.6 oz), SpO2 92 %.  PHYSICAL EXAMINATION:   Physical Exam  GENERAL:  71 y.o.-year-old patient lying in the bed with no acute distress.  EYES: Pupils equal, round, reactive to light and accommodation. No scleral icterus. Extraocular muscles intact.  HEENT: Head atraumatic, normocephalic. Oropharynx and nasopharynx clear.  NECK:  Supple, no jugular venous distention. No thyroid enlargement, no tenderness.  LUNGS: Bilateral coarse  crackles CARDIOVASCULAR: S1, S2 normal. No murmurs, rubs, or gallops.  ABDOMEN: Soft, nontender, nondistended. Bowel sounds present. No organomegaly or mass.  EXTREMITIES: No pedal edema, cyanosis, or clubbing.  NEUROLOGIC: Cranial nerves II through XII are intact. Muscle strength 5/5 in all extremities. Sensation intact. Gait not checked.  PSYCHIATRIC: The patient is alert and oriented x 3.  SKIN: No obvious rash, lesion, or ulcer.    LABORATORY PANEL:   CBC  Recent Labs Lab 05/19/16 0407  WBC 9.4  HGB 11.7*  HCT 36.2*  PLT 222   ------------------------------------------------------------------------------------------------------------------  Chemistries   Recent Labs Lab 05/18/16 1432  05/20/16 0339  NA 132*  < > 139  K 5.2*  < > 4.7  CL 102  < > 99*  CO2 22  < > 28  GLUCOSE 221*  < > 137*  BUN 28*  < > 36*  CREATININE 1.75*  < > 1.94*  CALCIUM 8.6*  < > 9.0  MG  --   < > 2.2  AST 53*  --   --   ALT 37  --   --   ALKPHOS 72  --   --   BILITOT 1.3*  --   --   < > = values in this interval not displayed. ------------------------------------------------------------------------------------------------------------------  Cardiac Enzymes  Recent Labs Lab 05/18/16 1432  TROPONINI <0.03   ------------------------------------------------------------------------------------------------------------------  RADIOLOGY:  Dg Chest 2 View  Result Date: 05/18/2016 CLINICAL DATA:  Shortness of breath and hemoptysis EXAM: CHEST  2 VIEW COMPARISON:  Chest radiograph April 22, 2016 and chest CT April 22, 2016. FINDINGS: There is bilateral interstitial pulmonary edema. Patchy airspace opacity is noted in the right upper lobe  as well as in both lower lung zone regions. There is cardiomegaly with pulmonary venous hypertension. There is no adenopathy. No bone lesions are evident. IMPRESSION: Findings most consistent with congestive heart failure with areas of superimposed  alveolar edema versus pneumonitis in the right upper and both lower lung zone regions. There may be both alveolar edema and pneumonia given this appearance. No adenopathy evident. Electronically Signed   By: Bretta Bang III M.D.   On: 05/18/2016 15:04   Ct Angio Chest Pe W And/or Wo Contrast  Result Date: 05/18/2016 CLINICAL DATA:  Shortness of breath over the last 2 days. Recently hospitalized for pneumonia. EXAM: CT ANGIOGRAPHY CHEST WITH CONTRAST TECHNIQUE: Multidetector CT imaging of the chest was performed using the standard protocol during bolus administration of intravenous contrast. Multiplanar CT image reconstructions and MIPs were obtained to evaluate the vascular anatomy. CONTRAST:  60 cc Isovue 370 COMPARISON:  Chest x-ray 05/18/2016, chest CT 04/22/2016 FINDINGS: Cardiovascular: Heart is enlarged. Three-vessel coronary artery disease is present. No pericardial effusion.Pulmonary arteries are well opacified. There is no acute pulmonary embolus. There is atherosclerotic calcification of the aortic arch. Aortic arch measures 3.9 cm maximum diameter. Mediastinum/Nodes: There is significant mediastinal and hilar adenopathy. Some of the mediastinal lymph nodes are calcified or partially calcified. Left hilar node is 1.3 cm. Subcarinal node is 2.3 cm. Right paratracheal is 1.4 cm. Lower right paratracheal node is 1.3 cm. Prevascular lymph node is 1.4 cm. Accounting for slight differences in measurements, there has been no change since the prior CT exam. Lungs/Pleura: There are small bilateral pleural effusions. There are focal areas of ground-glass density throughout the lungs, some which appear confluent. Ground-glass densities have progressed since the prior study with more central distribution identified than on the prior study. Upper Abdomen: Hiatal hernia is present. Musculoskeletal: Chronic compression fractures of T3 and T4, stable in appearance. Review of the MIP images confirms the above  findings. IMPRESSION: 1. No evidence for acute pulmonary embolus. 2. Cardiomegaly and coronary artery disease. 3. Dilated thoracic aortic arch. Recommend annual imaging followup by CTA or MRA. This recommendation follows 2010 ACCF/AHA/AATS/ACR/ASA/SCA/SCAI/SIR/STS/SVM Guidelines for the Diagnosis and Management of Patients with Thoracic Aortic Disease. Circulation.2010; 121: W119-J478 4. Interval progression of ground-glass opacities throughout the lungs, associated with bilateral pleural effusions and adenopathy. Considerations include typical or atypical pneumonia, sarcoidosis, fungal infection, lymphoproliferative disease, or metastatic disease associated with inflammation/infection. 5. Chronic compression fractures of T3 and T4. 6. Hiatal hernia. Electronically Signed   By: Norva Pavlov M.D.   On: 05/18/2016 17:49   Dg Chest Port 1 View  Result Date: 05/20/2016 CLINICAL DATA:  Increasing shortness of breath and productive cough with reddish sputum. Clinical diagnosis of CHF and possible pneumonitis-atypical pneumonia. EXAM: PORTABLE CHEST 1 VIEW COMPARISON:  Chest x-ray and chest CT scan of May 18, 2016 FINDINGS: The lungs are adequately inflated. The interstitial markings remain increased. There is no large pleural effusion or pneumothorax. The cardiac silhouette is enlarged. The pulmonary vascularity is mildly prominent centrally. IMPRESSION: Persistent increased interstitial markings bilaterally which may be acute or chronic or a combination of both. No alveolar infiltrate. No definite pulmonary edema. Electronically Signed   By: David  [place] M.D.   On: 05/20/2016 08:06     ASSESSMENT AND PLAN:   Active Problems:   Pneumonia  # Bilateral groundglass opacities. Pneumonia versus congestive heart failure Appreciate pulmonary input. On azithromycin for atypical coverage. Vancomycin and cefepime stopped by pulmonary.  # Acute on chronic diastolic CHF IV  Lasix. Monitor input and output.  Replace potassium as needed. Repeat BUN and creatinine in the morning. Daily weights.  # Hemoptysis due to cough and patient being on Coumadin. Coumadin held.   # atrial fibrillation: Rate controlled Continue flecainide Hold Coumadin due to hemoptysis  # essential hypertension: Continue current medicine.  # history of diabetes mellitus type 2 Sliding scale insulin  # depression: Lexapro  All the records are reviewed and case discussed with Care Management/Social Workerr. Management plans discussed with the patient, family and they are in agreement.  CODE STATUS:full  TOTAL TIME TAKING CARE OF THIS PATIENT: 35 minutes.   POSSIBLE D/C IN 1-2DAYS, DEPENDING ON CLINICAL CONDITION.   Milagros LollSudini, Bernita Beckstrom R M.D on 05/20/2016 at 1:58 PM  Between 7am to 6pm - Pager - [phone]  After 6pm go to [email or web address] - password [email or web address] [facility]  [facility] [place] [facility]  Office  [phone]  CC: Primary care physician; Lindwood QuaHOFFMAN,BYRON, MD

## 2016-05-20 NOTE — Consult Note (Signed)
PULMONARY / CRITICAL CARE MEDICINE   Name: Alan Stafford MRN: [id number] DOB: 01-Dec-1945    ADMISSION DATE:  05/18/2016   CONSULTATION DATE:  05/19/2016  REFERRING MD:  Dr. Vianne Bulls  CHIEF COMPLAINT:  Worsening dyspnea with exertion and weakness  HISTORY OF PRESENT ILLNESS:   This is a 71 y/o caucasian male who presented to the ED with worsening dyspnea with exertion and generalized weakness x several weeks. He was recently hospitalized and treated for bilateral pneumonia and sepsis. Post discharge, he felt better but then he gradually became weaker and it became more difficult to perform ADLs. On 3/24, he decided to come back to the ED. At the ED, his creatinine was elevated and his CXR showed congestive heart failure. His CT chest showed ground glass opacities/infiltrates hence PCCM was consulted.  Patient's last echo showed a LVEF of 35-40%. He reports feeling much better after receiving lasix  PAST MEDICAL HISTORY :  He  has a past medical history of A-fib ([place]); Abnormal heart rhythm; Arthritis; Asthma; Diabetes mellitus without complication ([place]); GERD (gastroesophageal reflux disease); Hypercholesteremia; Hypertension; Hyperthyroidism; and Kidney stones.  PAST SURGICAL HISTORY: He  has a past surgical history that includes Knee surgery (Right).  Allergies  Allergen Reactions  . Statins Other (See Comments)    Leg weakness  . Sulfa Antibiotics Rash    No current facility-administered medications on file prior to encounter.    Current Outpatient Prescriptions on File Prior to Encounter  Medication Sig  . albuterol (PROVENTIL HFA;VENTOLIN HFA) 108 (90 Base) MCG/ACT inhaler Inhale 1 puff into the lungs every 6 (six) hours as needed for wheezing or shortness of breath.   Marland Kitchen aspirin EC 81 MG tablet Take 81 mg by mouth.  . clotrimazole-betamethasone (LOTRISONE) cream Apply to affected area 2 times daily  . enalapril (VASOTEC) 10 MG tablet Take 5 mg by mouth daily.  Marland Kitchen escitalopram  (LEXAPRO) 20 MG tablet Take 40 mg by mouth.   . flecainide (TAMBOCOR) 100 MG tablet Take 100 mg by mouth 2 (two) times daily.  Marland Kitchen HYDROcodone-acetaminophen (NORCO) 10-325 MG tablet Take 2 tablets by mouth every 6 (six) hours as needed for pain.  . metFORMIN (GLUCOPHAGE) 500 MG tablet Take 500 mg by mouth 3 (three) times daily with meals.   . methimazole (TAPAZOLE) 10 MG tablet Take 10 mg by mouth daily.   . metoprolol succinate (TOPROL-XL) 25 MG 24 hr tablet Take 25 mg by mouth daily.  . montelukast (SINGULAIR) 10 MG tablet Take 10 mg by mouth at bedtime.   . nitroGLYCERIN (NITROSTAT) 0.4 MG SL tablet Place 0.4 mg under the tongue every 5 (five) minutes as needed for chest pain.   Marland Kitchen omeprazole (PRILOSEC) 20 MG capsule Take 20 mg by mouth daily.   . pregabalin (LYRICA) 100 MG capsule Take 100 mg by mouth 2 (two) times daily.  . sildenafil (REVATIO) 20 MG tablet TAKE ONE TO TWO TABLETS BY MOUTH ONCE DAILY AS NEEDED *DO NOT TAKE NITROGLYCERIN AFTERWARD*  . SYMBICORT 160-4.5 MCG/ACT inhaler Inhale 2 puffs into the lungs.   Marland Kitchen testosterone cypionate (DEPOTESTOSTERONE CYPIONATE) 200 MG/ML injection Inject 400 mg into the muscle every 14 (fourteen) days.   Marland Kitchen warfarin (COUMADIN) 5 MG tablet Take 5 mg by mouth daily.  Marland Kitchen albuterol (PROVENTIL HFA;VENTOLIN HFA) 108 (90 Base) MCG/ACT inhaler Inhale into the lungs.  . Blood Glucose Monitoring Suppl (FIFTY50 GLUCOSE METER 2.0) w/Device KIT 1 each by Other route once. Use as instructed to check fasting BS daily.  Glucometer ; One-Touch  . glucose blood (ACCU-CHEK COMFORT CURVE) test strip   . glucose blood (ONE TOUCH ULTRA TEST) test strip USE AS DIRECTED  . guaiFENesin-dextromethorphan (ROBITUSSIN DM) 100-10 MG/5ML syrup Take 5 mLs by mouth every 4 (four) hours as needed for cough. (Patient not taking: Reported on 05/18/2016)    FAMILY HISTORY:  His indicated that his mother is deceased. He indicated that his father is deceased.    SOCIAL HISTORY: He   reports that he has never smoked. He has never used smokeless tobacco. He reports that he does not drink alcohol or use drugs.  REVIEW OF SYSTEMS:   Constitutional: Negative for fever and chills.  HENT: Negative for congestion and rhinorrhea.  Eyes: Negative for redness and visual disturbance.  Respiratory: positive for shortness of breath and shortness of breath with exertion, denies orthopnea.  Cardiovascular: Negative for chest pain and palpitations.  Gastrointestinal: Negative  for nausea , vomiting and abdominal pain and  Loose stools Genitourinary: Negative for dysuria and urgency.  Endocrine: Denies polyuria, polyphagia and heat intolerance Musculoskeletal: Positive for generalized weakness.  Skin: Negative for pallor and wound.  Neurological: Negative for dizziness and headaches   SUBJECTIVE:   VITAL SIGNS: BP 123/70 (BP Location: Right Arm)   Pulse (!) 56   Temp 98 F (36.7 C) (Oral)   Resp 19   Ht _0  (1.88 m)   Wt 214 lb 1.6 oz (97.1 kg)   SpO2 92%   BMI 27.49 kg/m   HEMODYNAMICS:    VENTILATOR SETTINGS:    INTAKE / OUTPUT: I/O last 3 completed shifts: In: 736 [P.O.:480; I.V.:6; IV Piggyback:250] Out: 700 [Urine:700]  PHYSICAL EXAMINATION: General: pleasant, NAD Neuro:  Intact HEENT:  [place]/AT, PERRLA, oral mucosa moist Cardiovascular: RRR, S1/S2, no murmur, regurge or gallop, mild JVD Lungs:  Normal WOB, CTAB, diminished in the bases Abdomen: +BS X4 Musculoskeletal:  Intact strength in UE/LE, no joint deformities Skin:  Warm and dry  LABS:  BMET  Recent Labs Lab 05/18/16 1432 05/19/16 0407  NA 132* 133*  K 5.2* 4.2  CL 102 101  CO2 22 25  BUN 28* 31*  CREATININE 1.75* 1.66*  GLUCOSE 221* 152*    Electrolytes  Recent Labs Lab 05/18/16 1432 05/19/16 0407 05/19/16 1747  CALCIUM 8.6* 8.3*  --   MG  --   --  1.9    CBC  Recent Labs Lab 05/18/16 1432 05/19/16 0407  WBC 9.6 9.4  HGB 12.0* 11.7*  HCT 36.1* 36.2*  PLT 226 222     Coag's  Recent Labs Lab 05/18/16 1459 05/19/16 0407 05/20/16 0339  INR 2.53 2.63 2.14    Sepsis Markers No results for input(s): LATICACIDVEN, PROCALCITON, O2SATVEN in the last 168 hours.  ABG No results for input(s): PHART, PCO2ART, PO2ART in the last 168 hours.  Liver Enzymes  Recent Labs Lab 05/18/16 1432  AST 53*  ALT 37  ALKPHOS 72  BILITOT 1.3*  ALBUMIN 3.3*    Cardiac Enzymes  Recent Labs Lab 05/18/16 1432  TROPONINI <0.03    Glucose  Recent Labs Lab 05/18/16 2233 05/19/16 0734 05/19/16 1635 05/19/16 2055  GLUCAP 174* 100* 135* 132*    Imaging No results found.   STUDIES:  03/2016>EF 35-40%  CULTURES: Respiratory>  ANTIBIOTICS: Vnacomycin and cefepime discontinued  SIGNIFICANT EVENTS: 3/24>admitted with DOE/weakness  LINES/TUBES: PIVs  DISCUSSION: 71 y/o male presenting with new onset CHF and resolving CAP. His symptoms are more fluid related rather than persistent pneumonia  ASSESSMENT  New onset CHF Recent pneumonia H/O AFIB T2DM Asthma Hypertension Hyperthyroidism  Plan Lasix 80 mg x1 given; continue lasix 38m iv q12h Monitor and correct electrolytes CXR  And routine Labs in am Continue coumadin with PT/INR monitoring Trend procalcitonine Continue nebulized bronchodilators and  ABx discontinued Rest of the treatment plan per primary team   FAMILY  - Updates: Wife and patient updated at bedside  - Inter-disciplinary family meet or Palliative Care meeting due by:  day 7   Magdalene S. TMorton Hospital And Medical CenterANP-BC Pulmonary and CLake CavanaughPager [phone] [phone]/26/2018, 7:45 AM   PULM ATTENDING ATTESTATION:  I have evaluated patient with the APP [place], reviewed database in its entirety and discussed care plan in detail. He was hospitalized recently for same problem. In addition to SOB, he describes hemoptysis  Important exam findings: NAD on RA L>R crackles No  wheezes IRIR, tachy, no M No LE edema  CXR and CT chest reviewed by me  IMPRESSION: Acute hypoxic respiratory failure Cardiomyopathy AFRVR Bilateral pulmonary infiltrates and hemoptysis - etiology uncertain. CT pattern could be consistent with pulmonary edema or pneumonitis. I doubt that this is infectious and I am inclined to lean towards pulmonary edema due to cardiomyopathy and poorly controlled AF.   PLAN/REC: - Check ESR, PCT, BNP In AM 03/27 - DC montelukast (there are reports of pneumonitis due to LTI's and in general, this medication is not terribly effective for COPD or even most people with true asthma) - Focus on optimizing management of CHF and AF - Would avoid amiodarone for now if possible to avoid confusing the matter (given my concern for pneumonitis)   DMerton Border MD PCCM service Mobile [phone] [phone]/26/2018

## 2016-05-20 NOTE — Care Management Note (Signed)
Case Management Note  Patient Details  Name: MAIKEL NEISLER MRN: [id number] Date of Birth: 12-10-1945  Subjective/Objective:   Met with patient to discuss home health nursing and PT. He is adamant that he wants no home health services once discharged. Referred to CHF clinic.  Case closed.                 Action/Plan: Referred to CHF clinic.   Expected Discharge Date:                  Expected Discharge Plan:  Home w Hospice Care  In-House Referral:     Discharge planning Services  CM Consult  Post Acute Care Choice:  Home Health Choice offered to:  Patient  DME Arranged:    DME Agency:     HH Arranged:  Patient Refused [place] Agency:     Status of Service:  Completed, signed off  If discussed at [employer] of Stay Meetings, dates discussed:    Additional Comments:  Jolly Mango, RN 05/20/2016, 3:27 PM

## 2016-05-20 NOTE — Consult Note (Addendum)
[name]  CARDIOLOGY CONSULT NOTE  Patient ID: Alan Stafford MRN: [id number] DOB/AGE: 1945-09-16 71 y.o.  Admit date: 05/18/2016 Referring Physician Dr. Darvin Neighbours Primary Physician   Primary Cardiologist Dr. Clayborn Bigness Reason for Consultation runs of vt  HPI: Patient is a 71 year old male with history of atrial fibrillation, diabetes, hyperlipidemia, hypertension who was recently admitted with pneumonia. He was discharged to home on Levaquin. He continued to feel poorly after discharge per his report. He felt worse over the last couple of days with increasing shortness of breath and cough with some reddish tinted sputum. He returned to the emergency room where evaluation revealed probable congestive heart failure with areas of superimposed alveolar edema versus pneumonitis. Chest CT revealed interval progression of groundglass opacity throughout the lungs with associated bilateral pleural effusions and adenopathy. This was consistent with atypical pneumonia versus pulmonary edema. EKG revealed atrial fibrillation with variable but occasionally rapid ventricular response. There were a couple of episodes of wide-complex runs consistent with the tach versus aberrant atrial fibrillation. Patient was asymptomatic from these events. Axis QRS did not change suggesting probable aberrant conduction of his atrial fibrillation. Troponin was negative. Potassium was normal. Magnesium was 1.9. Patient is currently on metoprolol succinate 25 mg daily. He is anticoagulated with warfarin and he is therapeutic with an INR at 2.53 on. He also is on enalapril for afterload reduction and diuresing with furosemide. He is on Tambocor at 100 mg twice daily to attempt rhythm control.EF is 40-45% by recent echo  Review of Systems  HENT: Negative.   Eyes: Negative.   Respiratory: Positive for hemoptysis and shortness of breath.   Cardiovascular: Negative.   Gastrointestinal: Negative.    Genitourinary: Negative.   Musculoskeletal: Negative.   Skin: Negative.   Neurological: Positive for weakness.  Endo/Heme/Allergies: Negative.   Psychiatric/Behavioral: Negative.     Past Medical History:  Diagnosis Date  . A-fib ([place])   . Abnormal heart rhythm   . Arthritis   . Asthma   . Diabetes mellitus without complication ([place])   . GERD (gastroesophageal reflux disease)   . Hypercholesteremia   . Hypertension   . Hyperthyroidism   . Kidney stones     Family History  Problem Relation Age of Onset  . Dementia Mother   . Heart disease Father     Social History   Social History  . Marital status: Married    Spouse name: N/A  . Number of children: N/A  . Years of education: N/A   Occupational History  . Not on file.   Social History Main Topics  . Smoking status: Never Smoker  . Smokeless tobacco: Never Used  . Alcohol use No  . Drug use: No  . Sexual activity: Not on file   Other Topics Concern  . Not on file   Social History Narrative  . No narrative on file    Past Surgical History:  Procedure Laterality Date  . KNEE SURGERY Right      Prescriptions Prior to Admission  Medication Sig Dispense Refill Last Dose  . albuterol (PROVENTIL HFA;VENTOLIN HFA) 108 (90 Base) MCG/ACT inhaler Inhale 1 puff into the lungs every 6 (six) hours as needed for wheezing or shortness of breath.    PRN at PRN  . aspirin EC 81 MG tablet Take 81 mg by mouth.   05/18/2016 at 0800  . clotrimazole-betamethasone (LOTRISONE) cream Apply to affected area 2 times daily   PRN at PRN  . enalapril (  VASOTEC) 10 MG tablet Take 5 mg by mouth daily.   05/18/2016 at 0800  . escitalopram (LEXAPRO) 20 MG tablet Take 40 mg by mouth.    05/18/2016 at 0800  . flecainide (TAMBOCOR) 100 MG tablet Take 100 mg by mouth 2 (two) times daily.   05/18/2016 at 0800  . HYDROcodone-acetaminophen (NORCO) 10-325 MG tablet Take 2 tablets by mouth every 6 (six) hours as needed for pain.   05/18/2016 at 1200  .  metFORMIN (GLUCOPHAGE) 500 MG tablet Take 500 mg by mouth 3 (three) times daily with meals.    05/18/2016 at 0800  . methimazole (TAPAZOLE) 10 MG tablet Take 10 mg by mouth daily.    05/18/2016 at 0800  . metoprolol succinate (TOPROL-XL) 25 MG 24 hr tablet Take 25 mg by mouth daily.   05/18/2016 at 0800  . montelukast (SINGULAIR) 10 MG tablet Take 10 mg by mouth at bedtime.    05/17/2016 at 2000  . nitroGLYCERIN (NITROSTAT) 0.4 MG SL tablet Place 0.4 mg under the tongue every 5 (five) minutes as needed for chest pain.    prn at prn  . omeprazole (PRILOSEC) 20 MG capsule Take 20 mg by mouth daily.    05/18/2016 at 0800  . pregabalin (LYRICA) 100 MG capsule Take 100 mg by mouth 2 (two) times daily.   05/18/2016 at 0800  . sildenafil (REVATIO) 20 MG tablet TAKE ONE TO TWO TABLETS BY MOUTH ONCE DAILY AS NEEDED *DO NOT TAKE NITROGLYCERIN AFTERWARD*   prn at prn  . SYMBICORT 160-4.5 MCG/ACT inhaler Inhale 2 puffs into the lungs.    05/18/2016 at 0800  . testosterone cypionate (DEPOTESTOSTERONE CYPIONATE) 200 MG/ML injection Inject 400 mg into the muscle every 14 (fourteen) days.    unknown at unknown  . warfarin (COUMADIN) 5 MG tablet Take 5 mg by mouth daily.   05/18/2016 at 0800  . albuterol (PROVENTIL HFA;VENTOLIN HFA) 108 (90 Base) MCG/ACT inhaler Inhale into the lungs.   Not Taking  . Blood Glucose Monitoring Suppl (FIFTY50 GLUCOSE METER 2.0) w/Device KIT 1 each by Other route once. Use as instructed to check fasting BS daily.   Glucometer ; One-Touch   Taking  . glucose blood (ACCU-CHEK COMFORT CURVE) test strip    Taking  . glucose blood (ONE TOUCH ULTRA TEST) test strip USE AS DIRECTED   Taking  . guaiFENesin-dextromethorphan (ROBITUSSIN DM) 100-10 MG/5ML syrup Take 5 mLs by mouth every 4 (four) hours as needed for cough. (Patient not taking: Reported on 05/18/2016) 118 mL 0 Completed Course at Unknown time    Physical Exam: Blood pressure 117/63, pulse 99, temperature 98.2 F (36.8 C), temperature  source Oral, resp. rate 18, height 6' 2"  (1.88 m), weight 97.1 kg (214 lb 1.6 oz), SpO2 96 %.   Wt Readings from Last 1 Encounters:  05/18/16 97.1 kg (214 lb 1.6 oz)     General appearance: alert and cooperative Resp: rhonchi bilaterally Chest wall: no tenderness Cardio: irregularly irregular rhythm GI: soft, non-tender; bowel sounds normal; no masses,  no organomegaly Extremities: extremities normal, atraumatic, no cyanosis or edema Neurologic: Grossly normal  Labs:   Lab Results  Component Value Date   WBC 9.4 05/19/2016   HGB 11.7 (L) 05/19/2016   HCT 36.2 (L) 05/19/2016   MCV 70.4 (L) 05/19/2016   PLT 222 05/19/2016    Recent Labs Lab 05/18/16 1432 05/19/16 0407  NA 132* 133*  K 5.2* 4.2  CL 102 101  CO2 22 25  BUN  28* 31*  CREATININE 1.75* 1.66*  CALCIUM 8.6* 8.3*  PROT 7.5  --   BILITOT 1.3*  --   ALKPHOS 72  --   ALT 37  --   AST 53*  --   GLUCOSE 221* 152*   Lab Results  Component Value Date   TROPONINI <0.03 05/18/2016      EKG: Atrial fibrillation with variable ventricular response  ASSESSMENT AND PLAN:  36-year-old male with history of recent admission with pneumonia chronic atrial fibrillation, diabetes, hyperlipidemia and hypertension who was admitted with recurrent shortness of breath. Electrocardiogram revealing fetal atrial fibrillation with controlled ventricular response with occasional sodas a rapid ventricular response and episodes of wide-complex runs. The wide-complex runs appeared to be aberrant  conducted atrial fibrillation due to the lack of axis change. Lites and magnesium and potassium appear stable. Troponin is normal. Increasing shortness breath likely secondary to persistent airspace disease versus volume overload. Patient is being diuresed with Lasix. Will continue with flecainide and metoprolol for now and maintain inr between 2 and 3.  Signed: Teodoro Spray MD, [facility] 05/20/2016, 7:26 AM

## 2016-05-21 ENCOUNTER — Inpatient Hospital Stay: Payer: Medicare Other

## 2016-05-21 LAB — BASIC METABOLIC PANEL
Anion gap: 10 (ref 5–15)
BUN: 53 mg/dL — ABNORMAL HIGH (ref 6–20)
CHLORIDE: 99 mmol/L — AB (ref 101–111)
CO2: 27 mmol/L (ref 22–32)
CREATININE: 2.32 mg/dL — AB (ref 0.61–1.24)
Calcium: 8.5 mg/dL — ABNORMAL LOW (ref 8.9–10.3)
GFR calc Af Amer: 31 mL/min — ABNORMAL LOW (ref >60)
GFR calc non Af Amer: 27 mL/min — ABNORMAL LOW (ref >60)
GLUCOSE: 243 mg/dL — AB (ref 65–99)
Potassium: 4.3 mmol/L (ref 3.5–5.1)
Sodium: 136 mmol/L (ref 135–145)

## 2016-05-21 LAB — GLUCOSE, CAPILLARY
GLUCOSE-CAPILLARY: 156 mg/dL — AB (ref 65–99)
Glucose-Capillary: 138 mg/dL — ABNORMAL HIGH (ref 65–99)
Glucose-Capillary: 230 mg/dL — ABNORMAL HIGH (ref 65–99)
Glucose-Capillary: 162 mg/dL — ABNORMAL HIGH (ref 65–99)
Glucose-Capillary: 159 mg/dL — ABNORMAL HIGH (ref 65–99)

## 2016-05-21 MED ORDER — HYDROCODONE-ACETAMINOPHEN 10-325 MG PO TABS
1.0000 | ORAL_TABLET | Freq: Four times a day (QID) | ORAL | Status: DC | PRN
  Administered 2016-05-21: 2 via ORAL
  Administered 2016-05-22: 1 via ORAL
  Administered 2016-05-22 – 2016-05-23 (×2): 2 via ORAL
  Filled 2016-05-21: qty 2
  Filled 2016-05-21 (×2): qty 1
  Filled 2016-05-21: qty 2
  Filled 2016-05-21: qty 1

## 2016-05-21 MED ORDER — SENNA 8.6 MG PO TABS
1.0000 | ORAL_TABLET | Freq: Every day | ORAL | Status: DC
  Administered 2016-05-22 – 2016-05-23 (×2): 8.6 mg via ORAL
  Filled 2016-05-21 (×2): qty 1

## 2016-05-21 NOTE — Progress Notes (Signed)
Noted patient eating candy and asking to have "dip." Reinforced education r/t [facility] tobacco policy. This Clinical research associatewriter on 05/20/16 threw away "spit" cup.

## 2016-05-21 NOTE — Progress Notes (Signed)
Inpatient Diabetes Program Recommendations  AACE/ADA: New Consensus Statement on Inpatient Glycemic Control (2015)  Target Ranges:  Prepandial:   less than 140 mg/dL      Peak postprandial:   less than 180 mg/dL (1-2 hours)      Critically ill patients:  140 - 180 mg/dL   Results for Alan Stafford, Alan Stafford (MRN [id number]) as of 05/21/2016 11:09  Ref. Range 05/20/2016 07:36 05/20/2016 11:32 05/20/2016 16:45 05/20/2016 21:24 05/21/2016 07:52  Glucose-Capillary Latest Ref Range: 65 - 99 mg/dL 409119 (H) 811231 (H) 914222 (H) 217 (H) 138 (H)    Admit with: SOB  History: DM  Home DM Meds: Metformin 500 mg TID  Current Insulin Orders: Novolog Moderate Correction Scale/ SSI (0-15 units) TID AC + HS       MD- Please consider starting Novolog Meal Coverage for this patient while his home Metformin is on hold:  Novolog 4 units TID with meals (hold if pt eats <50% of meal)      --Will follow patient during hospitalization--  Ambrose FinlandJeannine Johnston Kritika Stukes RN, MSN, CDE Diabetes Coordinator Inpatient Glycemic Control Team Team Pager: [phone] (8a-5p)

## 2016-05-21 NOTE — Progress Notes (Signed)
No significant changes. No new complaints. Remains comfortable on RA. Denies CP, fever, purulent sputum, hemoptysis, LE edema and calf tenderness  Vitals:   05/20/16 1944 05/21/16 0413 05/21/16 0751 05/21/16 1136  BP: (!) 96/59 99/70 99/81  100/72  Pulse: 73 85 89 71  Resp: 19 18 18 18   Temp: 98.2 F (36.8 C) 97.5 F (36.4 C) 97.8 F (36.6 C)   TempSrc: Oral Oral Oral   SpO2: 97% 96% 97% 96%  Weight:      Height:       NAD HEENT WNL No jVD L>R crackles - improved No wheezes IRIR, rate control improved NABS No edema  BMP Latest Ref Rng & Units 05/21/2016 05/20/2016 05/19/2016  Glucose 65 - 99 mg/dL 243(H) 137(H) 152(H)  BUN 6 - 20 mg/dL 53(H) 36(H) 31(H)  Creatinine 0.61 - 1.24 mg/dL 2.32(H) 1.94(H) 1.66(H)  Sodium 135 - 145 mmol/L 136 139 133(L)  Potassium 3.5 - 5.1 mmol/L 4.3 4.7 4.2  Chloride 101 - 111 mmol/L 99(L) 99(L) 101  CO2 22 - 32 mmol/L 27 28 25   Calcium 8.9 - 10.3 mg/dL 8.5(L) 9.0 8.3(L)   CBC Latest Ref Rng & Units 05/19/2016 05/18/2016 04/24/2016  WBC 3.8 - 10.6 K/uL 9.4 9.6 7.5  Hemoglobin 13.0 - 18.0 g/dL 11.7(L) 12.0(L) 11.0(L)  Hematocrit 40.0 - 52.0 % 36.2(L) 36.1(L) 33.4(L)  Platelets 150 - 440 K/uL 222 226 219  ESR 5 mm/hr PCT < 0.10 BNP 418  CXR: [place] interstitial prominence  IMPRESSION: Mild hypoxic respiratory failure and dyspnea with mild interstitial prominence on CXR Cardiomyopathy CAF  Suspect his respiratory symptoms and CXR findings are likely due to CHF  PLAN/REC: Discussed with Dr Clayborn Bigness No further eval or intervention from pulmonary perspective I will have him come back and see me in the office in 4-6 weeks with repeat CXR @ that time  PCCM will sign off. Please call if we can be of further assistance  Merton Border, MD PCCM service Mobile [phone] Pager [phone] 05/21/2016

## 2016-05-21 NOTE — Progress Notes (Addendum)
Patient burated this Clinical research associatewriter r/t TEDs and SCDs order. Patient education given for purpose, explained that he has the right to refuse, but not encouraged.

## 2016-05-21 NOTE — Progress Notes (Signed)
[facility]  at [facility]   PATIENT NAME: Alan MuskratSteve Stafford    MR#:  [id number]  DATE OF BIRTH:  1945/10/13  SUBJECTIVE  CHIEF COMPLAINT:   Chief Complaint  Patient presents with  . Weakness   Feels better with breathing.  Hemoptysis improving. Has minimal dark blood. Likely old.  Anxious to go home.  REVIEW OF SYSTEMS:    Review of Systems  Constitutional: Positive for malaise/fatigue. Negative for chills and fever.  HENT: Negative for sore throat.   Eyes: Negative for blurred vision, double vision and pain.  Respiratory: Positive for cough, hemoptysis and shortness of breath. Negative for wheezing.   Cardiovascular: Negative for chest pain, palpitations, orthopnea and leg swelling.  Gastrointestinal: Negative for abdominal pain, constipation, diarrhea, heartburn, nausea and vomiting.  Genitourinary: Negative for dysuria and hematuria.  Musculoskeletal: Negative for back pain and joint pain.  Skin: Negative for rash.  Neurological: Negative for sensory change, speech change, focal weakness and headaches.  Endo/Heme/Allergies: Does not bruise/bleed easily.  Psychiatric/Behavioral: Negative for depression. The patient is not nervous/anxious.    DRUG ALLERGIES:   Allergies  Allergen Reactions  . Statins Other (See Comments)    Leg weakness  . Sulfa Antibiotics Rash    VITALS:  Blood pressure 100/72, pulse 71, temperature 97.8 F (36.6 C), temperature source Oral, resp. rate 18, height 6\' 2"  (1.88 m), weight 97.1 kg (214 lb 1.6 oz), SpO2 96 %.  PHYSICAL EXAMINATION:   Physical Exam  GENERAL:  71 y.o.-year-old patient lying in the bed with no acute distress.  EYES: Pupils equal, round, reactive to light and accommodation. No scleral icterus. Extraocular muscles intact.  HEENT: Head atraumatic, normocephalic. Oropharynx and nasopharynx clear.  NECK:  Supple, no jugular venous distention. No thyroid enlargement, no tenderness.  LUNGS:  Bilateral coarse crackles CARDIOVASCULAR: S1, S2 normal. No murmurs, rubs, or gallops.  ABDOMEN: Soft, nontender, nondistended. Bowel sounds present. No organomegaly or mass.  EXTREMITIES: No pedal edema, cyanosis, or clubbing.  NEUROLOGIC: Cranial nerves II through XII are intact. Muscle strength 5/5 in all extremities. Sensation intact. Gait not checked.  PSYCHIATRIC: The patient is alert and oriented x 3.  SKIN: No obvious rash, lesion, or ulcer.    LABORATORY PANEL:   CBC  Recent Labs Lab 05/19/16 0407  WBC 9.4  HGB 11.7*  HCT 36.2*  PLT 222   ------------------------------------------------------------------------------------------------------------------  Chemistries   Recent Labs Lab 05/18/16 1432  05/20/16 0339 05/21/16 0328  NA 132*  < > 139 136  K 5.2*  < > 4.7 4.3  CL 102  < > 99* 99*  CO2 22  < > 28 27  GLUCOSE 221*  < > 137* 243*  BUN 28*  < > 36* 53*  CREATININE 1.75*  < > 1.94* 2.32*  CALCIUM 8.6*  < > 9.0 8.5*  MG  --   < > 2.2  --   AST 53*  --   --   --   ALT 37  --   --   --   ALKPHOS 72  --   --   --   BILITOT 1.3*  --   --   --   < > = values in this interval not displayed. ------------------------------------------------------------------------------------------------------------------  Cardiac Enzymes  Recent Labs Lab 05/18/16 1432  TROPONINI <0.03   ------------------------------------------------------------------------------------------------------------------  RADIOLOGY:  Dg Chest 2 View  Result Date: 05/21/2016 CLINICAL DATA:  Hypoxia, pneumonia, atrial fibrillation, history of asthma, diabetes, chronic renal insufficiency. EXAM:  CHEST  2 VIEW COMPARISON:  Portable chest x-ray of May 20, 2016 FINDINGS: The lungs are well-expanded. The interstitial markings remain increased bilaterally. There is no significant pleural effusion and no pneumothorax. The cardiac silhouette remains enlarged. The pulmonary vascularity is mildly  prominent. The trachea is midline. The bony thorax exhibits no acute abnormality. Old lateral rib deformities on the left are present. IMPRESSION: Asthma with superimposed CHF. No alveolar pneumonia. There has not been dramatic interval change since yesterday's study. Electronically Signed   By: David  [place] M.D.   On: 05/21/2016 07:19   Dg Chest Port 1 View  Result Date: 05/20/2016 CLINICAL DATA:  Increasing shortness of breath and productive cough with reddish sputum. Clinical diagnosis of CHF and possible pneumonitis-atypical pneumonia. EXAM: PORTABLE CHEST 1 VIEW COMPARISON:  Chest x-ray and chest CT scan of May 18, 2016 FINDINGS: The lungs are adequately inflated. The interstitial markings remain increased. There is no large pleural effusion or pneumothorax. The cardiac silhouette is enlarged. The pulmonary vascularity is mildly prominent centrally. IMPRESSION: Persistent increased interstitial markings bilaterally which may be acute or chronic or a combination of both. No alveolar infiltrate. No definite pulmonary edema. Electronically Signed   By: David  [place] M.D.   On: 05/20/2016 08:06   ASSESSMENT AND PLAN:   Active Problems:   Pneumonia  # Bilateral groundglass opacities. Pneumonia versus congestive heart failure Appreciate pulmonary input. On azithromycin for atypical coverage. Vancomycin and cefepime stopped by pulmonary. Discussed with Dr. Sharol Harness. Chest x-ray in 4 weeks and follow-up as outpatient.  # Acute on chronic diastolic CHF Improving. Hold Lasix due to worsening creatinine. Monitor daily input and output. Daily weights.  # History of present illness over CKD stage III to diuresis. Hold Lasix. Repeat labs in the morning.  # Hemoptysis due to cough and patient being on Coumadin. Coumadin held.   # Atrial fibrillation: Rate controlled Continue flecainide Hold Coumadin due to hemoptysis  # essential hypertension: Continue current medicine.  # history of diabetes  mellitus type 2 Sliding scale insulin  # Depression: Lexapro  All the records are reviewed and case discussed with Care Management/Social Workerr. Management plans discussed with the patient, family and they are in agreement.  CODE STATUS:full  TOTAL TIME TAKING CARE OF THIS PATIENT: 35 minutes.   Likely discharge tomorrow on oral Lasix.  Milagros Loll R M.D on 05/21/2016 at 12:43 PM  Between 7am to 6pm - Pager - [phone]  After 6pm go to [email or web address] - password [email or web address] [facility]  [facility]  Office  [phone]  CC: Primary care physician; Lindwood Qua, MD

## 2016-05-22 ENCOUNTER — Other Ambulatory Visit: Payer: Self-pay | Admitting: *Deleted

## 2016-05-22 ENCOUNTER — Inpatient Hospital Stay: Payer: Medicare Other

## 2016-05-22 DIAGNOSIS — J189 Pneumonia, unspecified organism: Secondary | ICD-10-CM

## 2016-05-22 LAB — GLUCOSE, CAPILLARY
GLUCOSE-CAPILLARY: 185 mg/dL — AB (ref 65–99)
Glucose-Capillary: 141 mg/dL — ABNORMAL HIGH (ref 65–99)
Glucose-Capillary: 165 mg/dL — ABNORMAL HIGH (ref 65–99)

## 2016-05-22 LAB — BASIC METABOLIC PANEL
Anion gap: 9 (ref 5–15)
BUN: 58 mg/dL — AB (ref 6–20)
CALCIUM: 8.4 mg/dL — AB (ref 8.9–10.3)
CHLORIDE: 101 mmol/L (ref 101–111)
CO2: 27 mmol/L (ref 22–32)
CREATININE: 2.31 mg/dL — AB (ref 0.61–1.24)
GFR calc Af Amer: 31 mL/min — ABNORMAL LOW (ref >60)
GFR calc non Af Amer: 27 mL/min — ABNORMAL LOW (ref >60)
GLUCOSE: 123 mg/dL — AB (ref 65–99)
Potassium: 4.8 mmol/L (ref 3.5–5.1)
Sodium: 137 mmol/L (ref 135–145)

## 2016-05-22 LAB — CULTURE, RESPIRATORY W GRAM STAIN

## 2016-05-22 LAB — CULTURE, RESPIRATORY: CULTURE: NORMAL

## 2016-05-22 LAB — PROCALCITONIN: Procalcitonin: <0.1 ng/mL

## 2016-05-22 LAB — PROTIME-INR
INR: 2.01
Prothrombin Time: 23.1 seconds — ABNORMAL HIGH (ref 11.4–15.2)

## 2016-05-22 MED ORDER — DOXYCYCLINE HYCLATE 100 MG PO TABS
100.0000 mg | ORAL_TABLET | Freq: Two times a day (BID) | ORAL | Status: DC
  Administered 2016-05-23: 100 mg via ORAL
  Filled 2016-05-22: qty 1

## 2016-05-22 MED ORDER — ENOXAPARIN SODIUM 30 MG/0.3ML ~~LOC~~ SOLN
30.0000 mg | SUBCUTANEOUS | Status: DC
  Administered 2016-05-22: 30 mg via SUBCUTANEOUS
  Filled 2016-05-22: qty 0.3

## 2016-05-22 MED ORDER — NYSTATIN 100000 UNIT/ML MT SUSP
5.0000 mL | Freq: Three times a day (TID) | OROMUCOSAL | Status: DC
  Administered 2016-05-22 – 2016-05-23 (×2): 500000 [IU] via ORAL
  Filled 2016-05-22 (×2): qty 5

## 2016-05-22 MED ORDER — FUROSEMIDE 20 MG PO TABS: 20.0000 mg | ORAL_TABLET | Freq: Every day | ORAL | Status: DC

## 2016-05-22 NOTE — Plan of Care (Signed)
Problem: Safety: Goal: Ability to remain free from injury will improve Outcome: Progressing Variance: Psychosocial issues Comments: Patient  is noncompliant with safety measures. Patient noted attempting to turn off bed alarm while daughter at bedside. Patient and daughter educated about Patient's safety plan and encouraged not to disarm any alert or equipment.

## 2016-05-22 NOTE — Progress Notes (Signed)
[facility] [place] at [facility]   PATIENT NAME: Alan Stafford    MR#:  [id number]  DATE OF BIRTH:  [birth year]  SUBJECTIVE:  CHIEF COMPLAINT:   Chief Complaint  Patient presents with  . Weakness   - admitted with pneumonia and CHF - very sleepy today. Easily arousable - renal function still not back to normal  REVIEW OF SYSTEMS:  Review of Systems  Constitutional: Negative for chills, fever and malaise/fatigue.  HENT: Negative for congestion, ear discharge, hearing loss and nosebleeds.   Respiratory: Negative for cough, shortness of breath and wheezing.   Cardiovascular: Negative for chest pain and palpitations.  Gastrointestinal: Negative for abdominal pain, constipation, diarrhea, nausea and vomiting.  Genitourinary: Negative for dysuria.  Musculoskeletal: Positive for back pain.  Neurological: Negative for dizziness, speech change, focal weakness, seizures and headaches.  Psychiatric/Behavioral: The patient has insomnia.     DRUG ALLERGIES:   Allergies  Allergen Reactions  . Statins Other (See Comments)    Leg weakness  . Sulfa Antibiotics Rash    VITALS:  Blood pressure (!) 83/60, pulse 82, temperature 98.7 F (37.1 C), temperature source Oral, resp. rate 18, height 6\' 2"  (1.88 m), weight 97.1 kg (214 lb 1.6 oz), SpO2 97 %.  PHYSICAL EXAMINATION:  Physical Exam  GENERAL:  71 y.o.-year-old patient lying in the bed with no acute distress.  EYES: Pupils equal, round, reactive to light and accommodation. No scleral icterus. Extraocular muscles intact.  HEENT: Head atraumatic, normocephalic. Oropharynx and nasopharynx clear.  NECK:  Supple, no jugular venous distention. No thyroid enlargement, no tenderness.  LUNGS: Normal breath sounds bilaterally, fine bibasilar crackles heard.  no wheezing, rhonchi. No use of accessory muscles of respiration.  CARDIOVASCULAR: S1, S2 normal. No murmurs, rubs, or gallops.  ABDOMEN: Soft, nontender,  nondistended. Bowel sounds present. No organomegaly or mass.  EXTREMITIES: No pedal edema, cyanosis, or clubbing.  NEUROLOGIC: Cranial nerves II through XII are intact. Muscle strength 5/5 in all extremities. Sensation intact. Gait not checked.  PSYCHIATRIC: The patient is sleepy, easily awakened and oriented x 3.  SKIN: No obvious rash, lesion, or ulcer.    LABORATORY PANEL:   CBC  Recent Labs Lab 05/19/16 0407  WBC 9.4  HGB 11.7*  HCT 36.2*  PLT 222   ------------------------------------------------------------------------------------------------------------------  Chemistries   Recent Labs Lab 05/18/16 1432  05/20/16 0339  05/22/16 0523  NA 132*  < > 139  < > 137  K 5.2*  < > 4.7  < > 4.8  CL 102  < > 99*  < > 101  CO2 22  < > 28  < > 27  GLUCOSE 221*  < > 137*  < > 123*  BUN 28*  < > 36*  < > 58*  CREATININE 1.75*  < > 1.94*  < > 2.31*  CALCIUM 8.6*  < > 9.0  < > 8.4*  MG  --   < > 2.2  --   --   AST 53*  --   --   --   --   ALT 37  --   --   --   --   ALKPHOS 72  --   --   --   --   BILITOT 1.3*  --   --   --   --   < > = values in this interval not displayed. ------------------------------------------------------------------------------------------------------------------  Cardiac Enzymes  Recent Labs Lab 05/18/16 1432  TROPONINI <0.03   ------------------------------------------------------------------------------------------------------------------  RADIOLOGY:  Dg Chest 2 View  Result Date: 05/21/2016 CLINICAL DATA:  Hypoxia, pneumonia, atrial fibrillation, history of asthma, diabetes, chronic renal insufficiency. EXAM: CHEST  2 VIEW COMPARISON:  Portable chest x-ray of May 20, 2016 FINDINGS: The lungs are well-expanded. The interstitial markings remain increased bilaterally. There is no significant pleural effusion and no pneumothorax. The cardiac silhouette remains enlarged. The pulmonary vascularity is mildly prominent. The trachea is midline. The  bony thorax exhibits no acute abnormality. Old lateral rib deformities on the left are present. IMPRESSION: Asthma with superimposed CHF. No alveolar pneumonia. There has not been dramatic interval change since yesterday's study. Electronically Signed   By: David  SwazilandJordan M.D.   On: 05/21/2016 07:19    EKG:   Orders placed or performed during the hospital encounter of 05/18/16  . ED EKG  . ED EKG    ASSESSMENT AND PLAN:   71 y/o M with PMH of HTN, COPD/asthma, DM, Afib admitted for cough and shortness of breath  #1 Acute respiratory failure- secondary to systolic CHF exacerbation - appreciate pulmonary consult, patient received ABX and lasix - CXR with still pneumonitis pattern and CHF - off lasix due to worsening BUN/cr and hypotension - monitor closely. Currently on room air  #2 ARF on CKD- baseline cr around 1.7, now at 2.3 ATN, over diuresis, off lasix, not clinically dehydrated, BP is low - renal US and nephrology consult - avoid nephrotoxins, keep MAP>65  #3 Afib- rate controlled, toprol on hold now - continue flecainide - not on anticoagulation- currently due to hemoptysis- discuss with cardiology as outpatient  #4 Depression- continue effexor and lexapro  #5 Chronic back pain- prn norco and lyrica  #6 Systolic CHF- acute on chronic combined heart failure, currently meds on hold due to hypotension and ARF Monitor closely, appreciate cardiology consult EF 45%  #7 DVT Prophylaxis- lovenox started- to be renally adjusted Patient refusing TEDs and SCDs    All the records are reviewed and case discussed with Care Management/Social Workerr. Management plans discussed with the patient, family and they are in agreement.  CODE STATUS: Full Code  TOTAL TIME TAKING CARE OF THIS PATIENT: 38 minutes.   POSSIBLE D/C IN 1-2  DAYS, DEPENDING ON CLINICAL CONDITION.   Enid BaasKALISETTI,Lurleen Soltero M.D on 05/22/2016 at 10:47 AM  Between 7am to 6pm - Pager - [phone]  After 6pm go to  [email or web address] - Social research officer, [email or web address] [email or web address]  [facility]  Office  [phone]  CC: Primary care physician; Lindwood QuaHOFFMAN,BYRON, MD

## 2016-05-22 NOTE — Discharge Instructions (Signed)
[facility] appointment on May 29, 2016 at 10:40am with Clarisa Kindredina Avika Carbine, FNP. Please call [phone] to reschedule.

## 2016-05-23 ENCOUNTER — Telehealth: Payer: Self-pay

## 2016-05-23 LAB — GLUCOSE, CAPILLARY
GLUCOSE-CAPILLARY: 126 mg/dL — AB (ref 65–99)
Glucose-Capillary: 168 mg/dL — ABNORMAL HIGH (ref 65–99)

## 2016-05-23 LAB — BASIC METABOLIC PANEL
ANION GAP: 9 (ref 5–15)
BUN: 48 mg/dL — ABNORMAL HIGH (ref 6–20)
CALCIUM: 8.6 mg/dL — AB (ref 8.9–10.3)
CO2: 25 mmol/L (ref 22–32)
Chloride: 101 mmol/L (ref 101–111)
Creatinine, Ser: 1.83 mg/dL — ABNORMAL HIGH (ref 0.61–1.24)
GFR calc Af Amer: 41 mL/min — ABNORMAL LOW (ref >60)
GFR calc non Af Amer: 36 mL/min — ABNORMAL LOW (ref >60)
GLUCOSE: 126 mg/dL — AB (ref 65–99)
Potassium: 5.2 mmol/L — ABNORMAL HIGH (ref 3.5–5.1)
Sodium: 135 mmol/L (ref 135–145)

## 2016-05-23 MED ORDER — METOPROLOL SUCCINATE ER 25 MG PO TB24: 12.5000 mg | ORAL_TABLET | Freq: Every day | ORAL | 0 refills | Status: DC

## 2016-05-23 MED ORDER — NICOTINE 14 MG/24HR TD PT24: 14.0000 mg | MEDICATED_PATCH | Freq: Every day | TRANSDERMAL | 0 refills | Status: DC

## 2016-05-23 MED ORDER — DOXYCYCLINE HYCLATE 100 MG PO TABS: 100.0000 mg | ORAL_TABLET | Freq: Two times a day (BID) | ORAL | 0 refills | Status: DC

## 2016-05-23 NOTE — Progress Notes (Signed)
Pt given discharge instructions and prescriptions. Pt verbalized understanding, Pt wheeled to car by staff.

## 2016-05-23 NOTE — Telephone Encounter (Signed)
L MOM to inform pt of appt and also advise to get CXR [facility].

## 2016-05-23 NOTE — Discharge Summary (Signed)
[facility] [place] NAME: Alan Stafford    MR#:  [id number]  DATE OF BIRTH:  [birth year]  DATE OF ADMISSION:  05/18/2016   ADMITTING PHYSICIAN: Baxter Hire, MD  DATE OF DISCHARGE: 05/23/2016 11:30 AM  PRIMARY CARE PHYSICIAN: Raelene Bott, MD   ADMISSION DIAGNOSIS:   Generalized weakness [R53.1] HCAP (healthcare-associated pneumonia) [J18.9]  DISCHARGE DIAGNOSIS:   Active Problems:   Pneumonia   SECONDARY DIAGNOSIS:   Past Medical History:  Diagnosis Date  . A-fib ([place])   . Abnormal heart rhythm   . Arthritis   . Asthma   . Diabetes mellitus without complication ([place])   . GERD (gastroesophageal reflux disease)   . Hypercholesteremia   . Hypertension   . Hyperthyroidism   . Kidney stones     HOSPITAL COURSE:   71 y/o M with PMH of HTN, COPD/asthma, DM, Afib admitted for cough and shortness of breath  #1 Acute respiratory failure- secondary to systolic CHF exacerbation - appreciate pulmonary consult, patient received ABX and lasix -CT chest with bibasilar groundglass opacities. Patient is off oxygen at the time of discharge. -second admission with similar findings on CT chest. Has an outpatient follow-up with pulmonary in 3 weeks. No vasculitis workup for pneumonitis workup ordered. May be pursued as outpatient. If no improvement, consider biopsy -Will be discharged on doxycycline - off lasix due to worsening BUN/cr and hypotension - monitor closely. Currently on room air  #2 ARF on CKD- baseline cr around 1.7, improved creatinine to 1.8 at discharge. -Continue to hold Lasix at this time. Outpatient pulmonary follow-up for his groundglass opacities. -Outpatient nephrology follow-up in 2 weeks. - renal US normal without any obstruction. - avoid nephrotoxins, keep MAP>65  #3 Afib- rate controlled, decreased the dose of metoprolol. - continue flecainide - not on anticoagulation- currently due to hemoptysis-  discuss with cardiology as outpatient  #4 Depression- continue effexor and lexapro  #5 Chronic back pain- prn norco and lyrica  #6 Systolic CHF- acute on chronic combined heart failure, currently enalapril on hold due to hypotension and ARF -Continue low-dose Toprol. appreciate cardiology consult EF 45% on echocardiogram  #7 hyperthyroidism-continue methimazole  Patient will be discharged today.   DISCHARGE CONDITIONS:   Guarded  CONSULTS OBTAINED:   Treatment Team:  Teodoro Spray, MD  DRUG ALLERGIES:   Allergies  Allergen Reactions  . Statins Other (See Comments)    Leg weakness  . Sulfa Antibiotics Rash   DISCHARGE MEDICATIONS:   Allergies as of 05/23/2016      Reactions   Statins Other (See Comments)   Leg weakness   Sulfa Antibiotics Rash      Medication List    STOP taking these medications   enalapril 10 MG tablet Commonly known as:  VASOTEC   guaiFENesin-dextromethorphan 100-10 MG/5ML syrup Commonly known as:  ROBITUSSIN DM   metFORMIN 500 MG tablet Commonly known as:  GLUCOPHAGE   sildenafil 20 MG tablet Commonly known as:  REVATIO   testosterone cypionate 200 MG/ML injection Commonly known as:  DEPOTESTOSTERONE CYPIONATE   warfarin 5 MG tablet Commonly known as:  COUMADIN     TAKE these medications   ACCU-CHEK COMFORT CURVE test strip Generic drug:  glucose blood   ONE TOUCH ULTRA TEST test strip Generic drug:  glucose blood USE AS DIRECTED   albuterol 108 (90 Base) MCG/ACT inhaler Commonly known as:  PROVENTIL HFA;VENTOLIN HFA Inhale 1 puff into the lungs every 6 (six) hours  as needed for wheezing or shortness of breath. What changed:  Another medication with the same name was removed. Continue taking this medication, and follow the directions you see here.   aspirin EC 81 MG tablet Take 81 mg by mouth.   clotrimazole-betamethasone cream Commonly known as:  LOTRISONE Apply to affected area 2 times daily   doxycycline 100  MG tablet Commonly known as:  VIBRA-TABS Take 1 tablet (100 mg total) by mouth every 12 (twelve) hours. X 7 more days   escitalopram 20 MG tablet Commonly known as:  LEXAPRO Take 40 mg by mouth.   FIFTY50 GLUCOSE METER 2.0 w/Device Kit 1 each by Other route once. Use as instructed to check fasting BS daily.   Glucometer ; One-Touch   flecainide 100 MG tablet Commonly known as:  TAMBOCOR Take 100 mg by mouth 2 (two) times daily.   HYDROcodone-acetaminophen 10-325 MG tablet Commonly known as:  NORCO Take 2 tablets by mouth every 6 (six) hours as needed for pain.   methimazole 10 MG tablet Commonly known as:  TAPAZOLE Take 10 mg by mouth daily.   metoprolol succinate 25 MG 24 hr tablet Commonly known as:  TOPROL-XL Take 0.5 tablets (12.5 mg total) by mouth daily. What changed:  how much to take   montelukast 10 MG tablet Commonly known as:  SINGULAIR Take 10 mg by mouth at bedtime.   nicotine 14 mg/24hr patch Commonly known as:  NICODERM CQ - dosed in mg/24 hours Place 1 patch (14 mg total) onto the skin daily.   nitroGLYCERIN 0.4 MG SL tablet Commonly known as:  NITROSTAT Place 0.4 mg under the tongue every 5 (five) minutes as needed for chest pain.   omeprazole 20 MG capsule Commonly known as:  PRILOSEC Take 20 mg by mouth daily.   pregabalin 100 MG capsule Commonly known as:  LYRICA Take 100 mg by mouth 2 (two) times daily.   SYMBICORT 160-4.5 MCG/ACT inhaler Generic drug:  budesonide-formoterol Inhale 2 puffs into the lungs.        DISCHARGE INSTRUCTIONS:   1. PCP f/u in 1-2 weeks 2. Nephrology f/u in 2 weeks 3. Pulmonology f/u in 3 weeks  DIET:   Cardiac diet  ACTIVITY:   Activity as tolerated  OXYGEN:   Home Oxygen: No.  Oxygen Delivery: room air  DISCHARGE LOCATION:   home   If you experience worsening of your admission symptoms, develop shortness of breath, life threatening emergency, suicidal or homicidal thoughts you must seek  medical attention immediately by calling 911 or calling your MD immediately  if symptoms less severe.  You Must read complete instructions/literature along with all the possible adverse reactions/side effects for all the Medicines you take and that have been prescribed to you. Take any new Medicines after you have completely understood and accpet all the possible adverse reactions/side effects.   Please note  You were cared for by a hospitalist during your hospital stay. If you have any questions about your discharge medications or the care you received while you were in the hospital after you are discharged, you can call the unit and asked to speak with the hospitalist on call if the hospitalist that took care of you is not available. Once you are discharged, your primary care physician will handle any further medical issues. Please note that NO REFILLS for any discharge medications will be authorized once you are discharged, as it is imperative that you return to your primary care physician (or establish a  relationship with a primary care physician if you do not have one) for your aftercare needs so that they can reassess your need for medications and monitor your lab values.    On the day of Discharge:  VITAL SIGNS:   Blood pressure 113/80, pulse 74, temperature 97.9 F (36.6 C), temperature source Oral, resp. rate 18, height 6' 2"  (1.88 m), weight 97.1 kg (214 lb 1.6 oz), SpO2 94 %.  PHYSICAL EXAMINATION:    GENERAL:  71 y.o.-year-old patient lying in the bed with no acute distress.  EYES: Pupils equal, round, reactive to light and accommodation. No scleral icterus. Extraocular muscles intact.  HEENT: Head atraumatic, normocephalic. Oropharynx and nasopharynx clear.  NECK:  Supple, no jugular venous distention. No thyroid enlargement, no tenderness.  LUNGS: Normal breath sounds bilaterally, fine bibasilar crackles heard.  no wheezing, rhonchi. No use of accessory muscles of respiration.    CARDIOVASCULAR: S1, S2 normal. No murmurs, rubs, or gallops.  ABDOMEN: Soft, nontender, nondistended. Bowel sounds present. No organomegaly or mass.  EXTREMITIES: No pedal edema, cyanosis, or clubbing.  NEUROLOGIC: Cranial nerves II through XII are intact. Muscle strength 5/5 in all extremities. Sensation intact. Gait not checked.  PSYCHIATRIC: The patient is alert and oriented x 3.  SKIN: No obvious rash, lesion, or ulcer.  DATA REVIEW:   CBC  Recent Labs Lab 05/19/16 0407  WBC 9.4  HGB 11.7*  HCT 36.2*  PLT 222    Chemistries   Recent Labs Lab 05/18/16 1432  05/20/16 0339  05/23/16 0556  NA 132*  < > 139  < > 135  K 5.2*  < > 4.7  < > 5.2*  CL 102  < > 99*  < > 101  CO2 22  < > 28  < > 25  GLUCOSE 221*  < > 137*  < > 126*  BUN 28*  < > 36*  < > 48*  CREATININE 1.75*  < > 1.94*  < > 1.83*  CALCIUM 8.6*  < > 9.0  < > 8.6*  MG  --   < > 2.2  --   --   AST 53*  --   --   --   --   ALT 37  --   --   --   --   ALKPHOS 72  --   --   --   --   BILITOT 1.3*  --   --   --   --   < > = values in this interval not displayed.   Microbiology Results  Results for orders placed or performed during the hospital encounter of 05/18/16  MRSA PCR Screening     Status: None   Collection Time: 05/18/16  9:52 PM  Result Value Ref Range Status   MRSA by PCR NEGATIVE NEGATIVE Final    Comment:        The GeneXpert MRSA Assay (FDA approved for NASAL specimens only), is one component of a comprehensive MRSA colonization surveillance program. It is not intended to diagnose MRSA infection nor to guide or monitor treatment for MRSA infections.   Culture, expectorated sputum-assessment     Status: None   Collection Time: 05/19/16 12:34 PM  Result Value Ref Range Status   Specimen Description EXPECTORATED SPUTUM  Final   Special Requests NONE  Final   Sputum evaluation   Final    Sputum specimen not acceptable for testing.  Please recollect.     Report Status 05/19/2016 FINAL  Final  Culture, expectorated sputum-assessment     Status: None   Collection Time: 05/19/16  5:19 PM  Result Value Ref Range Status   Specimen Description SPU  Final   Special Requests NONE  Final   Sputum evaluation THIS SPECIMEN IS ACCEPTABLE FOR SPUTUM CULTURE  Final   Report Status 05/19/2016 FINAL  Final  Culture, respiratory (NON-Expectorated)     Status: None   Collection Time: 05/19/16  5:19 PM  Result Value Ref Range Status   Specimen Description SPU  Final   Special Requests NONE Reflexed from [id number]  Final   Gram Stain   Final    FEW WBC PRESENT, PREDOMINANTLY PMN RARE SQUAMOUS EPITHELIAL CELLS PRESENT FEW GRAM POSITIVE COCCI IN PAIRS RARE BUDDING YEAST SEEN RARE GRAM NEGATIVE RODS    Culture   Final    Consistent with normal respiratory flora. Performed at [facility], [place] [address], [place], [place] [address]    Report Status 05/22/2016 FINAL  Final    RADIOLOGY:  No results found.   Management plans discussed with the patient, family and they are in agreement.  CODE STATUS:  Code Status History    Date Active Date Inactive Code Status Order ID Comments User Context   05/18/2016  9:50 PM 05/23/2016  3:14 PM Full Code [id number]  Baxter Hire, MD Inpatient   04/23/2016  2:47 AM 04/25/2016  2:54 PM Full Code [id number]  Lance Coon, MD Inpatient      TOTAL TIME TAKING CARE OF THIS PATIENT: 38 minutes.    Gladstone Lighter M.D on 05/23/2016 at 3:26 PM  Between 7am to 6pm - Pager - [phone]  After 6pm go to [email or web address] - Proofreader  [facility]  Office  [phone]  CC: Primary care physician; Raelene Bott, MD   Note: This dictation was prepared with Dragon dictation along with smaller phrase technology. Any transcriptional errors that result from this process are unintentional.

## 2016-05-23 NOTE — Telephone Encounter (Signed)
-----   Message [facility] Ahmad, [place] sent at 1/61/09603/28/2018  2:26 PM EDT ----- Please schedule as directed below and inform pt [facility] hour prior to appt here and then come over for appt. Order placed.  Thanks, Misty ----- Message ----- From: Merwyn Katosavid B Simonds, MD Sent: 05/21/2016  12:27 PM To: Renea EeMisty R Ahmad, LPN  Please schedule follow up with me in 4-6 weeks with CXR prior to visit  [name]

## 2016-05-29 ENCOUNTER — Ambulatory Visit: Payer: Medicare Other | Admitting: Family

## 2016-06-05 ENCOUNTER — Ambulatory Visit: Payer: Medicare Other | Attending: Family | Admitting: Family

## 2016-06-05 ENCOUNTER — Encounter: Payer: Self-pay | Admitting: Family

## 2016-06-05 VITALS — BP 101/79 | HR 84 | Resp 20 | Ht 73.0 in | Wt 217.0 lb

## 2016-06-05 DIAGNOSIS — E0590 Thyrotoxicosis, unspecified without thyrotoxic crisis or storm: Secondary | ICD-10-CM | POA: Diagnosis not present

## 2016-06-05 DIAGNOSIS — K219 Gastro-esophageal reflux disease without esophagitis: Secondary | ICD-10-CM | POA: Diagnosis not present

## 2016-06-05 DIAGNOSIS — I4891 Unspecified atrial fibrillation: Secondary | ICD-10-CM | POA: Insufficient documentation

## 2016-06-05 DIAGNOSIS — I480 Paroxysmal atrial fibrillation: Secondary | ICD-10-CM

## 2016-06-05 DIAGNOSIS — Z7984 Long term (current) use of oral hypoglycemic drugs: Secondary | ICD-10-CM | POA: Insufficient documentation

## 2016-06-05 DIAGNOSIS — I5022 Chronic systolic (congestive) heart failure: Secondary | ICD-10-CM | POA: Diagnosis present

## 2016-06-05 DIAGNOSIS — Z888 Allergy status to other drugs, medicaments and biological substances status: Secondary | ICD-10-CM | POA: Diagnosis not present

## 2016-06-05 DIAGNOSIS — I10 Essential (primary) hypertension: Secondary | ICD-10-CM

## 2016-06-05 DIAGNOSIS — Z7901 Long term (current) use of anticoagulants: Secondary | ICD-10-CM | POA: Insufficient documentation

## 2016-06-05 DIAGNOSIS — E7800 Pure hypercholesterolemia, unspecified: Secondary | ICD-10-CM | POA: Diagnosis not present

## 2016-06-05 DIAGNOSIS — Z87442 Personal history of urinary calculi: Secondary | ICD-10-CM | POA: Insufficient documentation

## 2016-06-05 DIAGNOSIS — E119 Type 2 diabetes mellitus without complications: Secondary | ICD-10-CM | POA: Insufficient documentation

## 2016-06-05 DIAGNOSIS — I110 Hypertensive heart disease with heart failure: Secondary | ICD-10-CM | POA: Diagnosis present

## 2016-06-05 DIAGNOSIS — J45909 Unspecified asthma, uncomplicated: Secondary | ICD-10-CM | POA: Insufficient documentation

## 2016-06-05 NOTE — Progress Notes (Signed)
Patient ID: Alan Stafford, male    DOB: [birth year], 71 y.o.   MRN: [id number]  HPI  Alan Stafford is a 71 y/o male with a history of  Last echo was done 04/23/16 and showed an EF of 40-45% along with mild Alan/TR.   Admitted 05/18/16 with pneumonia and HF exacerbation. Pulmonology consult done and was treated with antibiotics and furosemide. Chest CT showed bibasilar groundglass opacities. Outpatient nephrology recommended due to CKD. Cardiology consult also obtained. Discharged home. Admitted 04/22/16 with pneumonia and sepsis. Influenza negative. Give IV antibiotics. Discharged home on antibiotics and prednisone taper.   He presents today for his initial visit with a chief complaint of fatigue with moderate exertion. Improves quickly upon rest. Denies any shortness of breath or swelling in his legs/abdomen. Had one episode of chest pain yesterday which resolved quickly on its own. Doesn't have any scales so hasn't been weighing himself daily. Not adding any salt to his food. Does admit to drinking quite of bit of fluids during the day.  Past Medical History:  Diagnosis Date  . A-fib ([place])   . Abnormal heart rhythm   . Arthritis   . Asthma   . Diabetes mellitus without complication (Alan Stafford)   . GERD (gastroesophageal reflux disease)   . Hypercholesteremia   . Hypertension   . Hyperthyroidism   . Kidney stones    Past Surgical History:  Procedure Laterality Date  . KNEE SURGERY Right    Family History  Problem Relation Age of Onset  . Dementia Mother   . Heart disease Father    Social History  Substance Use Topics  . Smoking status: Never Smoker  . Smokeless tobacco: Never Used  . Alcohol use No   Allergies  Allergen Reactions  . Statins Other (See Comments)    Leg weakness  . Sulfa Antibiotics Rash   Prior to Admission medications   Medication Sig Start Date End Date Taking? Authorizing Provider  albuterol (PROVENTIL HFA;VENTOLIN HFA) 108 (90 Base) MCG/ACT inhaler Inhale 1 puff  into the lungs every 6 (six) hours as needed for wheezing or shortness of breath.    Yes Historical Provider, MD  aspirin EC 81 MG tablet Take 81 mg by mouth. 01/01/11  Yes Historical Provider, MD  Blood Glucose Monitoring Suppl (FIFTY50 GLUCOSE METER 2.0) w/Device KIT 1 each by Other route once. Use as instructed to check fasting BS daily.   Glucometer ; One-Touch   Yes Historical Provider, MD  clotrimazole-betamethasone (LOTRISONE) cream Apply to affected area 2 times daily 06/26/15 06/22/16 Yes Historical Provider, MD  enalapril (VASOTEC) 5 MG tablet Take 5 mg by mouth daily.   Yes Historical Provider, MD  escitalopram (LEXAPRO) 20 MG tablet Take 40 mg by mouth.  04/18/15  Yes Historical Provider, MD  flecainide (TAMBOCOR) 100 MG tablet Take 100 mg by mouth 2 (two) times daily.   Yes Historical Provider, MD  glucose blood (ACCU-CHEK COMFORT CURVE) test strip  12/27/09  Yes Historical Provider, MD  glucose blood (ONE TOUCH ULTRA TEST) test strip USE AS DIRECTED 07/20/13  Yes Historical Provider, MD  HYDROcodone-acetaminophen (NORCO) 10-325 MG tablet Take 2 tablets by mouth every 6 (six) hours as needed for pain.   Yes Historical Provider, MD  metFORMIN (GLUCOPHAGE) 500 MG tablet Take by mouth 2 (two) times daily with a meal.   Yes Historical Provider, MD  methimazole (TAPAZOLE) 10 MG tablet Take 10 mg by mouth 2 (two) times daily.    Yes Historical Provider, MD  metoprolol succinate (TOPROL-XL) 25 MG 24 hr tablet Take 0.5 tablets (12.5 mg total) by mouth daily. Patient taking differently: Take 25 mg by mouth daily.  05/23/16  Yes Gladstone Lighter, MD  nicotine (NICODERM CQ - DOSED IN MG/24 HOURS) 14 mg/24hr patch Place 1 patch (14 mg total) onto the skin daily. 05/23/16  Yes Gladstone Lighter, MD  nitroGLYCERIN (NITROSTAT) 0.4 MG SL tablet Place 0.4 mg under the tongue every 5 (five) minutes as needed for chest pain.    Yes Historical Provider, MD  omeprazole (PRILOSEC) 20 MG capsule Take 20 mg by mouth  daily.    Yes Historical Provider, MD  pregabalin (LYRICA) 100 MG capsule Take 100 mg by mouth 2 (two) times daily.   Yes Historical Provider, MD  SYMBICORT 160-4.5 MCG/ACT inhaler Inhale 2 puffs into the lungs.  03/05/16  Yes Historical Provider, MD  Testosterone (TESTODERM TD) Place onto the skin.   Yes Historical Provider, MD  warfarin (COUMADIN) 5 MG tablet Take 5 mg by mouth daily.   Yes Historical Provider, MD     Review of Systems  Constitutional: Positive for fatigue. Negative for appetite change.  HENT: Positive for rhinorrhea. Negative for congestion and sore throat.   Eyes: Negative.   Respiratory: Negative for chest tightness and shortness of breath.   Cardiovascular: Positive for chest pain (the other day; resolved on its own). Negative for palpitations and leg swelling.  Gastrointestinal: Negative for abdominal distention and abdominal pain.  Endocrine: Negative.   Genitourinary: Negative.   Musculoskeletal: Negative for back pain and neck pain.  Skin: Negative.   Allergic/Immunologic: Negative.   Neurological: Negative for dizziness and light-headedness.  Hematological: Negative for adenopathy. Bruises/bleeds easily.  Psychiatric/Behavioral: Positive for dysphoric mood. Negative for sleep disturbance and suicidal ideas. The patient is not nervous/anxious.    Vitals:   06/05/16 1059  BP: 101/79  Pulse: 84  Resp: 20  SpO2: 98%  Weight: 217 lb (98.4 kg)  Height: _0  (1.854 m)   Wt Readings from Last 3 Encounters:  06/05/16 217 lb (98.4 kg)  05/18/16 214 lb 1.6 oz (97.1 kg)  04/22/16 220 lb (99.8 kg)   Lab Results  Component Value Date   CREATININE 1.83 (H) 05/23/2016   CREATININE 2.31 (H) 05/22/2016   CREATININE 2.32 (H) 05/21/2016    Physical Exam  Constitutional: He is oriented to person, place, and time. He appears well-developed and well-nourished.  HENT:  Head: Normocephalic and atraumatic.  Eyes: Conjunctivae are normal. Pupils are equal, round, and  reactive to light.  Neck: Normal range of motion. Neck supple. No JVD present.  Cardiovascular: Normal rate and regular rhythm.   Pulmonary/Chest: Effort normal. He has no wheezes. He has no rales.  Abdominal: Soft. He exhibits no distension. There is no tenderness.  Musculoskeletal: He exhibits no edema or tenderness.  Neurological: He is alert and oriented to person, place, and time.  Skin: Skin is warm and dry.  Psychiatric: He has a normal mood and affect. His behavior is normal. Thought content normal.  Nursing note and vitals reviewed.   Assessment & Plan:  1: Chronic heart failure with reduced ejection fraction- - NYHA class II - euvolemic today - not weighing daily as he doesn't have scales. Scales provided today and he was instructed to weigh daily after using the bathroom and write the weight down. Instructed to call for an overnight weight gain of >2 pounds or a weekly weight gain of >5 pounds - not adding salt to  his food. Reviewed the importance of closely following a 2067m sodium diet and written dietary information was given to him about this - drinking 80-112 ounces of fluid daily. Discussed decreasing that down to closer to 50 ounces daily - PharmD went in and reviewed medications.  2: Atrial fibrillation- - currently rate controlled at this time - taking metoprolol and warfarin but would like to take one of the newer agents but is concerned about cost - Pharm D called patient's insurance company and wrote down the prices for some of the NOAC's and patient says that he'll talk with his cardiologist about switching and using mail order - saw cardiologist (Clayborn Bigness 04/15/16  3: HTN- - BP on the low side but patient without dizziness - [place] sooner for any questions/problems before then.

## 2016-06-05 NOTE — Patient Instructions (Signed)
Begin weighing daily and call for an overnight weight gain of > 2 pounds or a weekly weight gain of >5 pounds. 

## 2016-06-06 DIAGNOSIS — I5022 Chronic systolic (congestive) heart failure: Secondary | ICD-10-CM | POA: Insufficient documentation

## 2016-07-12 ENCOUNTER — Encounter: Payer: Self-pay | Admitting: Pulmonary Disease

## 2016-07-12 ENCOUNTER — Ambulatory Visit
Admission: RE | Admit: 2016-07-12 | Discharge: 2016-07-12 | Disposition: A | Discharge status: Home / Self Care | Payer: Medicare Other | Source: Ambulatory Visit | Attending: Pulmonary Disease | Admitting: Pulmonary Disease

## 2016-07-12 ENCOUNTER — Ambulatory Visit (INDEPENDENT_AMBULATORY_CARE_PROVIDER_SITE_OTHER): Payer: Medicare Other | Admitting: Pulmonary Disease

## 2016-07-12 VITALS — BP 126/78 | HR 82 | Resp 16 | Ht 73.0 in | Wt 223.0 lb

## 2016-07-12 DIAGNOSIS — R918 Other nonspecific abnormal finding of lung field: Secondary | ICD-10-CM

## 2016-07-12 DIAGNOSIS — I2510 Atherosclerotic heart disease of native coronary artery without angina pectoris: Secondary | ICD-10-CM

## 2016-07-12 DIAGNOSIS — I5022 Chronic systolic (congestive) heart failure: Secondary | ICD-10-CM

## 2016-07-12 DIAGNOSIS — I509 Heart failure, unspecified: Secondary | ICD-10-CM | POA: Diagnosis not present

## 2016-07-12 DIAGNOSIS — J4530 Mild persistent asthma, uncomplicated: Secondary | ICD-10-CM | POA: Diagnosis not present

## 2016-07-12 DIAGNOSIS — R042 Hemoptysis: Secondary | ICD-10-CM

## 2016-07-12 NOTE — Patient Instructions (Signed)
Chest Xray today We will call you with results  Follow up as needed 

## 2016-07-14 NOTE — Progress Notes (Signed)
PULMONARY HOSPITAL FOLLOW UP NOTE  HOSPITAL SUMMARY:  71 y.o. male hospitalized 03/24-03/29/18 with hypoxic resp failure, scant hemoptysis and bilateral pulmonary infiltrates. Seen in consultation by PCCM. Noted to have cardiomyopathy and atrial fibrillation. Pulmonary infiltrates thought to be due to pulmonary edema.  SUBJ:  This follow up was arranged to follow up on pulmonary infiltrates noted during recent hospitalization. Since discharge, has done "pretty good".  Reports class I-II dyspnea. Denies orthopnea, PND, CP, cough, hemoptysis, LE edema. He has minimal remote smoking history. No new complaints.  He is presently on flecainide and metoprolol.   He has history of mild asthma for which he is on Symbicort and PRN albuterol (which he rarely uses)  Did not get CXR prior to encounter.   OBJ:  Vitals:   07/12/16 0916 07/12/16 0921  BP:  126/78  Pulse:  82  Resp:  16  SpO2:  96%  Weight:  223 lb (101.2 kg)  Height: 6\' 1"  (1.854 m) 6\' 1"  (1.854 m)     EXAM:   Gen: WDWN in NAD HEENT: NCAT, sclerae white, oropharynx normal Neck: NO LAN, no JVD noted Lungs: full BS, normal percussion note throughout, no adventitious sounds Cardiovascular: Reg rate, normal rhythm, no M noted Abdomen: Soft, NT, +BS Ext: no C/C/E Neuro: PERRL, EOMI, motor/sensory grossly intact Skin: No lesions noted   DATA:   BMP Latest Ref Rng & Units 05/23/2016 05/22/2016 05/21/2016  Glucose 65 - 99 mg/dL 161(W126(H) 960(A123(H) 540(J243(H)  BUN 6 - 20 mg/dL 81(X48(H) 91(Y58(H) 78(G53(H)  Creatinine 0.61 - 1.24 mg/dL 9.56(O1.83(H) 1.30(Q2.31(H) 6.57(Q2.32(H)  Sodium 135 - 145 mmol/L 135 137 136  Potassium 3.5 - 5.1 mmol/L 5.2(H) 4.8 4.3  Chloride 101 - 111 mmol/L 101 101 99(L)  CO2 22 - 32 mmol/L 25 27 27   Calcium 8.9 - 10.3 mg/dL 4.6(N8.6(L) 6.2(X8.4(L) 5.2(W8.5(L)    CBC Latest Ref Rng & Units 05/19/2016 05/18/2016 04/24/2016  WBC 3.8 - 10.6 K/uL 9.4 9.6 7.5  Hemoglobin 13.0 - 18.0 g/dL 11.7(L) 12.0(L) 11.0(L)  Hematocrit 40.0 - 52.0 % 36.2(L) 36.1(L)  33.4(L)  Platelets 150 - 440 K/uL 222 226 219    CXR (07/12/16): Mild CM and vascular redistribution, pulmonary infiltrates overall improved. (Reviewed by me after encounter and my interpretation was communicated to pt over phone by me.)     IMPRESSION:     ICD-9-CM ICD-10-CM   1. Pulmonary infiltrate 793.19 R91.8 DG Chest 2 View  2. Mild persistent asthma without complication 493.90 J45.30   3. Chronic systolic congestive heart failure (HCC) 428.22 I50.22    428.0    4. Hemoptysis, resolved 786.30 R04.2    Hemoptysis resolved. Infiltrates improved to resolved on CXR. No evidence of bronchospasm. His recent hospitalization can be fully attributed to CHF.   PLAN:  Optimize mgmt of CHF Cont Symbicort and PRN albuterol  Follow up PRN   Billy Fischeravid Ryliee Figge, MD PCCM service Mobile [phone] Pager [phone] 07/14/2016

## 2016-07-31 ENCOUNTER — Telehealth: Payer: Self-pay | Admitting: Pulmonary Disease

## 2016-07-31 NOTE — Telephone Encounter (Signed)
Pt calling asking for a call back  Stating he has a grievance with hospital  They told him that he has CHF  He states he is not sure who put that in there but that is not true The hospital stated to him that we put that in there  He needs this straighten out.   Please advise

## 2016-07-31 NOTE — Telephone Encounter (Signed)
Please advise on message below.

## 2016-08-05 ENCOUNTER — Encounter: Payer: Self-pay | Admitting: Pain Medicine

## 2016-08-05 ENCOUNTER — Ambulatory Visit (HOSPITAL_BASED_OUTPATIENT_CLINIC_OR_DEPARTMENT_OTHER): Payer: Medicare Other | Admitting: Pain Medicine

## 2016-08-05 ENCOUNTER — Ambulatory Visit
Admission: RE | Admit: 2016-08-05 | Discharge: 2016-08-05 | Disposition: A | Discharge status: Home / Self Care | Payer: Medicare Other | Source: Ambulatory Visit | Attending: Pain Medicine | Admitting: Pain Medicine

## 2016-08-05 VITALS — BP 132/86 | HR 73 | Temp 97.9°F | Resp 17 | Ht 74.0 in | Wt 223.0 lb

## 2016-08-05 DIAGNOSIS — G894 Chronic pain syndrome: Secondary | ICD-10-CM | POA: Insufficient documentation

## 2016-08-05 DIAGNOSIS — G8929 Other chronic pain: Secondary | ICD-10-CM | POA: Insufficient documentation

## 2016-08-05 DIAGNOSIS — M79604 Pain in right leg: Secondary | ICD-10-CM | POA: Diagnosis not present

## 2016-08-05 DIAGNOSIS — E1142 Type 2 diabetes mellitus with diabetic polyneuropathy: Secondary | ICD-10-CM | POA: Insufficient documentation

## 2016-08-05 DIAGNOSIS — M9983 Other biomechanical lesions of lumbar region: Secondary | ICD-10-CM | POA: Diagnosis not present

## 2016-08-05 DIAGNOSIS — M48061 Spinal stenosis, lumbar region without neurogenic claudication: Secondary | ICD-10-CM

## 2016-08-05 DIAGNOSIS — M5416 Radiculopathy, lumbar region: Secondary | ICD-10-CM | POA: Diagnosis not present

## 2016-08-05 MED ORDER — SODIUM CHLORIDE 0.9 % IJ SOLN
INTRAMUSCULAR | Status: AC
Start: 2016-08-05 — End: ?
  Filled 2016-08-05: qty 10

## 2016-08-05 MED ORDER — LIDOCAINE HCL (PF) 1 % IJ SOLN
INTRAMUSCULAR | Status: AC
  Filled 2016-08-05: qty 5

## 2016-08-05 MED ORDER — ROPIVACAINE HCL 2 MG/ML IJ SOLN
2.0000 mL | Freq: Once | INTRAMUSCULAR | Status: AC
  Administered 2016-08-05: 10 mL via EPIDURAL

## 2016-08-05 MED ORDER — TRIAMCINOLONE ACETONIDE 40 MG/ML IJ SUSP
INTRAMUSCULAR | Status: AC
  Filled 2016-08-05: qty 1

## 2016-08-05 MED ORDER — ROPIVACAINE HCL 2 MG/ML IJ SOLN
INTRAMUSCULAR | Status: AC
  Filled 2016-08-05: qty 10

## 2016-08-05 MED ORDER — SODIUM CHLORIDE 0.9 % IJ SOLN
INTRAMUSCULAR | Status: AC
  Filled 2016-08-05: qty 10

## 2016-08-05 MED ORDER — SODIUM CHLORIDE 0.9% FLUSH
2.0000 mL | Freq: Once | INTRAVENOUS | Status: AC
  Administered 2016-08-05: 10 mL

## 2016-08-05 MED ORDER — IOPAMIDOL (ISOVUE-M 200) INJECTION 41%
10.0000 mL | Freq: Once | INTRAMUSCULAR | Status: AC
  Administered 2016-08-05: 10 mL via EPIDURAL
  Filled 2016-08-05: qty 10

## 2016-08-05 MED ORDER — LIDOCAINE HCL (PF) 1 % IJ SOLN
10.0000 mL | Freq: Once | INTRAMUSCULAR | Status: AC
  Administered 2016-08-05: 5 mL

## 2016-08-05 MED ORDER — TRIAMCINOLONE ACETONIDE 40 MG/ML IJ SUSP
40.0000 mg | Freq: Once | INTRAMUSCULAR | Status: AC
  Administered 2016-08-05: 40 mg

## 2016-08-05 NOTE — Patient Instructions (Addendum)
Post-Procedure instructions Instructions:  Apply ice: Fill a plastic sandwich bag with crushed ice. Cover it with a small towel and apply to injection site. Apply for 15 minutes then remove x 15 minutes. Repeat sequence on day of procedure, until you go to bed. The purpose is to minimize swelling and discomfort after procedure.  Apply heat: Apply heat to procedure site starting the day following the procedure. The purpose is to treat any soreness and discomfort from the procedure.  Food intake: Start with clear liquids (like water) and advance to regular food, as tolerated.   Physical activities: Keep activities to a minimum for the first 8 hours after the procedure.   Driving: If you have received any sedation, you are not allowed to drive for 24 hours after your procedure.  Blood thinner: Restart your blood thinner 6 hours after your procedure. (Only for those taking blood thinners)  Insulin: As soon as you can eat, you may resume your normal dosing schedule. (Only for those taking insulin)  Infection prevention: Keep procedure site clean and dry.  Post-procedure Pain Diary: Extremely important that this be done correctly and accurately. Recorded information will be used to determine the next step in treatment.  Pain evaluated is that of treated area only. Do not include pain from an untreated area.  Complete every hour, on the hour, for the initial 8 hours. Set an alarm to help you do this part accurately.  Do not go to sleep and have it completed later. It will not be accurate.  Follow-up appointment: Keep your follow-up appointment after the procedure. Usually 2 weeks for most procedures. (6 weeks in the case of radiofrequency.) Bring you pain diary.  Expect:  From numbing medicine (AKA: Local Anesthetics): Numbness or decrease in pain.  Onset: Full effect within 15 minutes of injected.  Duration: It will depend on the type of local anesthetic used. On the average, 1 to 8  hours.   From steroids: Decrease in swelling or inflammation. Once inflammation is improved, relief of the pain will follow.  Onset of benefits: Depends on the amount of swelling present. The more swelling, the longer it will take for the benefits to be seen.   Duration: Steroids will stay in the system x 2 weeks. Duration of benefits will depend on multiple posibilities including persistent irritating factors.  From procedure: Some discomfort is to be expected once the numbing medicine wears off. This should be minimal if ice and heat are applied as instructed. Call if:  You experience numbness and weakness that gets worse with time, as opposed to wearing off.  New onset bowel or bladder incontinence. (Spinal procedures only)  Emergency Numbers:  Durning business hours (Monday - Thursday, 8:00 AM - 4:00 PM) (Friday, 9:00 AM - 12:00 Noon): [phone]  After hours: [phone] _____________________________________________________________________________________________  Pain Management Discharge Instructions  General Discharge Instructions :  If you need to reach your doctor call: Monday-Friday 8:00 am - 4:00 pm at [phone] or toll free [phone].  After clinic hours [phone] to have operator reach doctor.  Bring all of your medication bottles to all your appointments in the pain clinic.  To cancel or reschedule your appointment with Pain Management please remember to call 24 hours in advance to avoid a fee.  Refer to the educational materials which you have been given on: General Risks, I had my Procedure. Discharge Instructions, Post Sedation.  Post Procedure Instructions:  The drugs you were given will stay in your system until   tomorrow, so for the next 24 hours you should not drive, make any legal decisions or drink any alcoholic beverages.  You may eat anything you prefer, but it is better to start with liquids then soups and crackers, and gradually work  up to solid foods.  Please notify your doctor immediately if you have any unusual bleeding, trouble breathing or pain that is not related to your normal pain.  Depending on the type of procedure that was done, some parts of your body may feel week and/or numb.  This usually clears up by tonight or the next day.  Walk with the use of an assistive device or accompanied by an adult for the 24 hours.  You may use ice on the affected area for the first 24 hours.  Put ice in a Ziploc bag and cover with a towel and place against area 15 minutes on 15 minutes off.  You may switch to heat after 24 hours.GENERAL RISKS AND COMPLICATIONS  What are the risk, side effects and possible complications? Generally speaking, most procedures are safe.  However, with any procedure there are risks, side effects, and the possibility of complications.  The risks and complications are dependent upon the sites that are lesioned, or the type of nerve block to be performed.  The closer the procedure is to the spine, the more serious the risks are.  Great care is taken when placing the radio frequency needles, block needles or lesioning probes, but sometimes complications can occur. 1. Infection: Any time there is an injection through the skin, there is a risk of infection.  This is why sterile conditions are used for these blocks.  There are four possible types of infection. 1. Localized skin infection. 2. Central Nervous System Infection-This can be in the form of Meningitis, which can be deadly. 3. Epidural Infections-This can be in the form of an epidural abscess, which can cause pressure inside of the spine, causing compression of the spinal cord with subsequent paralysis. This would require an emergency surgery to decompress, and there are no guarantees that the patient would recover from the paralysis. 4. Discitis-This is an infection of the intervertebral discs.  It occurs in about 1% of discography procedures.  It is  difficult to treat and it may lead to surgery.        2. Pain: the needles have to go through skin and soft tissues, will cause soreness.       3. Damage to internal structures:  The nerves to be lesioned may be near blood vessels or    other nerves which can be potentially damaged.       4. Bleeding: Bleeding is more common if the patient is taking blood thinners such as  aspirin, Coumadin, Ticiid, Plavix, etc., or if he/she have some genetic predisposition  such as hemophilia. Bleeding into the spinal canal can cause compression of the spinal  cord with subsequent paralysis.  This would require an emergency surgery to  decompress and there are no guarantees that the patient would recover from the  paralysis.       5. Pneumothorax:  Puncturing of a lung is a possibility, every time a needle is introduced in  the area of the chest or upper back.  Pneumothorax refers to free air around the  collapsed lung(s), inside of the thoracic cavity (chest cavity).  Another two possible  complications related to a similar event would include: Hemothorax and Chylothorax.   These are variations of the   Pneumothorax, where instead of air around the collapsed  lung(s), you may have blood or chyle, respectively.       6. Spinal headaches: They may occur with any procedures in the area of the spine.       7. Persistent CSF (Cerebro-Spinal Fluid) leakage: This is a rare problem, but may occur  with prolonged intrathecal or epidural catheters either due to the formation of a fistulous  track or a dural tear.       8. Nerve damage: By working so close to the spinal cord, there is always a possibility of  nerve damage, which could be as serious as a permanent spinal cord injury with  paralysis.       9. Death:  Although rare, severe deadly allergic reactions known as "Anaphylactic  reaction" can occur to any of the medications used.      10. Worsening of the symptoms:  We can always make thing worse.  What are the chances of  something like this happening? Chances of any of this occuring are extremely low.  By statistics, you have more of a chance of getting killed in a motor vehicle accident: while driving to the hospital than any of the above occurring .  Nevertheless, you should be aware that they are possibilities.  In general, it is similar to taking a shower.  Everybody knows that you can slip, hit your head and get killed.  Does that mean that you should not shower again?  Nevertheless always keep in mind that statistics do not mean anything if you happen to be on the wrong side of them.  Even if a procedure has a 1 (one) in a 1,000,000 (million) chance of going wrong, it you happen to be that one..Also, keep in mind that by statistics, you have more of a chance of having something go wrong when taking medications.  Who should not have this procedure? If you are on a blood thinning medication (e.g. Coumadin, Plavix, see list of "Blood Thinners"), or if you have an active infection going on, you should not have the procedure.  If you are taking any blood thinners, please inform your physician.  How should I prepare for this procedure?  Do not eat or drink anything at least six hours prior to the procedure.  Bring a driver with you .  It cannot be a taxi.  Come accompanied by an adult that can drive you back, and that is strong enough to help you if your legs get weak or numb from the local anesthetic.  Take all of your medicines the morning of the procedure with just enough water to swallow them.  If you have diabetes, make sure that you are scheduled to have your procedure done first thing in the morning, whenever possible.  If you have diabetes, take only half of your insulin dose and notify our nurse that you have done so as soon as you arrive at the clinic.  If you are diabetic, but only take blood sugar pills (oral hypoglycemic), then do not take them on the morning of your procedure.  You may take them  after you have had the procedure.  Do not take aspirin or any aspirin-containing medications, at least eleven (11) days prior to the procedure.  They may prolong bleeding.  Wear loose fitting clothing that may be easy to take off and that you would not mind if it got stained with Betadine or blood.  Do not wear any jewelry   or perfume  Remove any nail coloring.  It will interfere with some of our monitoring equipment.  NOTE: Remember that this is not meant to be interpreted as a complete list of all possible complications.  Unforeseen problems may occur.  BLOOD THINNERS The following drugs contain aspirin or other products, which can cause increased bleeding during surgery and should not be taken for 2 weeks prior to and 1 week after surgery.  If you should need take something for relief of minor pain, you may take acetaminophen which is found in Tylenol,m Datril, Anacin-3 and Panadol. It is not blood thinner. The products listed below are.  Do not take any of the products listed below in addition to any listed on your instruction sheet.  A.P.C or A.P.C with Codeine Codeine Phosphate Capsules #3 Ibuprofen Ridaura  ABC compound Congesprin Imuran rimadil  Advil Cope Indocin Robaxisal  Alka-Seltzer Effervescent Pain Reliever and Antacid Coricidin or Coricidin-D  Indomethacin Rufen  Alka-Seltzer plus Cold Medicine Cosprin Ketoprofen S-A-C Tablets  Anacin Analgesic Tablets or Capsules Coumadin Korlgesic Salflex  Anacin Extra Strength Analgesic tablets or capsules CP-2 Tablets Lanoril Salicylate  Anaprox Cuprimine Capsules Levenox Salocol  Anexsia-D Dalteparin Magan Salsalate  Anodynos Darvon compound Magnesium Salicylate Sine-off  Ansaid Dasin Capsules Magsal Sodium Salicylate  Anturane Depen Capsules Marnal Soma  APF Arthritis pain formula Dewitt's Pills Measurin Stanback  Argesic Dia-Gesic Meclofenamic Sulfinpyrazone  Arthritis Bayer Timed Release Aspirin Diclofenac Meclomen Sulindac   Arthritis pain formula Anacin Dicumarol Medipren Supac  Analgesic (Safety coated) Arthralgen Diffunasal Mefanamic Suprofen  Arthritis Strength Bufferin Dihydrocodeine Mepro Compound Suprol  Arthropan liquid Dopirydamole Methcarbomol with Aspirin Synalgos  ASA tablets/Enseals Disalcid Micrainin Tagament  Ascriptin Doan's Midol Talwin  Ascriptin A/D Dolene Mobidin Tanderil  Ascriptin Extra Strength Dolobid Moblgesic Ticlid  Ascriptin with Codeine Doloprin or Doloprin with Codeine Momentum Tolectin  Asperbuf Duoprin Mono-gesic Trendar  Aspergum Duradyne Motrin or Motrin IB Triminicin  Aspirin plain, buffered or enteric coated Durasal Myochrisine Trigesic  Aspirin Suppositories Easprin Nalfon Trillsate  Aspirin with Codeine Ecotrin Regular or Extra Strength Naprosyn Uracel  Atromid-S Efficin Naproxen Ursinus  Auranofin Capsules Elmiron Neocylate Vanquish  Axotal Emagrin Norgesic Verin  Azathioprine Empirin or Empirin with Codeine Normiflo Vitamin E  Azolid Emprazil Nuprin Voltaren  Bayer Aspirin plain, buffered or children's or timed BC Tablets or powders Encaprin Orgaran Warfarin Sodium  Buff-a-Comp Enoxaparin Orudis Zorpin  Buff-a-Comp with Codeine Equegesic Os-Cal-Gesic   Buffaprin Excedrin plain, buffered or Extra Strength Oxalid   Bufferin Arthritis Strength Feldene Oxphenbutazone   Bufferin plain or Extra Strength Feldene Capsules Oxycodone with Aspirin   Bufferin with Codeine Fenoprofen Fenoprofen Pabalate or Pabalate-SF   Buffets II Flogesic Panagesic   Buffinol plain or Extra Strength Florinal or Florinal with Codeine Panwarfarin   Buf-Tabs Flurbiprofen Penicillamine   Butalbital Compound Four-way cold tablets Penicillin   Butazolidin Fragmin Pepto-Bismol   Carbenicillin Geminisyn Percodan   Carna Arthritis Reliever Geopen Persantine   Carprofen Gold's salt Persistin   Chloramphenicol Goody's Phenylbutazone   Chloromycetin Haltrain Piroxlcam   Clmetidine heparin Plaquenil    Cllnoril Hyco-pap Ponstel   Clofibrate Hydroxy chloroquine Propoxyphen         Before stopping any of these medications, be sure to consult the physician who ordered them.  Some, such as Coumadin (Warfarin) are ordered to prevent or treat serious conditions such as "deep thrombosis", "pumonary embolisms", and other heart problems.  The amount of time that you may need off of the medication may also vary with   the medication and the reason for which you were taking it.  If you are taking any of these medications, please make sure you notify your pain physician before you undergo any procedures.          Epidural Steroid Injection An epidural steroid injection is a shot of steroid medicine and numbing medicine that is given into the space between the spinal cord and the bones in your back (epidural space). The shot helps relieve pain caused by an irritated or swollen nerve root. The amount of pain relief you get from the injection depends on what is causing the nerve to be swollen and irritated, and how long your pain lasts. You are more likely to benefit from this injection if your pain is strong and comes on suddenly rather than if you have had pain for a long time. Tell a health care provider about:  Any allergies you have.  All medicines you are taking, including vitamins, herbs, eye drops, creams, and over-the-counter medicines.  Any problems you or family members have had with anesthetic medicines.  Any blood disorders you have.  Any surgeries you have had.  Any medical conditions you have.  Whether you are pregnant or may be pregnant. What are the risks? Generally, this is a safe procedure. However, problems may occur, including:  Headache.  Bleeding.  Infection.  Allergic reaction to medicines.  Damage to your nerves. What happens before the procedure? Staying hydrated  Follow instructions from your health care provider about hydration, which may include:  Up to  2 hours before the procedure - you may continue to drink clear liquids, such as water, clear fruit juice, black coffee, and plain tea. Eating and drinking restrictions  Follow instructions from your health care provider about eating and drinking, which may include:  8 hours before the procedure - stop eating heavy meals or foods such as meat, fried foods, or fatty foods.  6 hours before the procedure - stop eating light meals or foods, such as toast or cereal.  6 hours before the procedure - stop drinking milk or drinks that contain milk.  2 hours before the procedure - stop drinking clear liquids. Medicine   You may be given medicines to lower anxiety.  Ask your health care provider about:  Changing or stopping your regular medicines. This is especially important if you are taking diabetes medicines or blood thinners.  Taking medicines such as aspirin and ibuprofen. These medicines can thin your blood. Do not take these medicines before your procedure if your health care provider instructs you not to. General instructions   Plan to have someone take you home from the hospital or clinic. What happens during the procedure?  You may receive a medicine to help you relax (sedative).  You will be asked to lie on your abdomen.  The injection site will be cleaned.  A numbing medicine (local anesthetic) will be used to numb the injection site.  A needle will be inserted through your skin into the epidural space. You may feel some discomfort when this happens. An X-ray machine will be used to make sure the needle is put as close as possible to the affected nerve.  A steroid medicine and a local anesthetic will be injected into the epidural space.  The needle will be removed.  A bandage (dressing) will be put over the injection site. What happens after the procedure?  Your blood pressure, heart rate, breathing rate, and blood oxygen level will be monitored   until the medicines you were  given have worn off.  Your arm or leg may feel weak or numb for a few hours.  The injection site may feel sore.  Do not drive for 24 hours if you received a sedative. This information is not intended to replace advice given to you by your health care provider. Make sure you discuss any questions you have with your health care provider. Document Released: 05/21/2007 Document Revised: 07/26/2015 Document Reviewed: 05/30/2015 [employer] Interactive Patient Education  2017 [employer]  

## 2016-08-05 NOTE — Progress Notes (Signed)
Patient's Name: Alan Stafford  MRN: [id number]  Referring Provider: Lindwood Qua, MD  DOB: May 11, 1945  PCP: Lindwood Qua, MD  DOS: 08/05/2016  Note by: Sydnee Levans. Laban Emperor, MD  Service setting: Ambulatory outpatient  Location: [facility] (AMB) Pain Management Facility  Visit type: Procedure  Specialty: Interventional Pain Management  Patient type: Established   Primary Reason for Visit: Interventional Pain Management Treatment. CC: Back Pain (both sides of the low back and down both legs. worse on the right)  Procedure:  Anesthesia, Analgesia, Anxiolysis:  Type: Therapeutic Inter-Laminar Epidural Steroid Injection Region: Lumbar Level: L4-5 Level. Laterality: Right-Sided Paramedial  Type: Local Anesthesia Local Anesthetic: Lidocaine 1% Route: Infiltration ([place]/IM) IV Access: Declined Sedation: Declined  Indication(s): Analgesia          Indications: 1. Chronic lumbar radicular pain (Right)   2. 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 Chronic lower extremity pain (Location of Primary Source of Pain) (Bilateral) (R>L)    Pain Score: Pre-procedure: 5 /10 Post-procedure: 0-No pain/10  Pre-op Assessment:  Previous date of service: 04/01/16 Service provided: Procedure Alan Stafford is a 70 y.o. (year old), male patient, seen today for interventional treatment. He  has a past surgical history that includes Knee surgery (Right). His primarily concern today is the Back Pain (both sides of the low back and down both legs. worse on the right)  Initial Vital Signs: Blood pressure 129/78, pulse 71, temperature 97.9 F (36.6 C), temperature source Oral, resp. rate 16, height 6\' 2"  (1.88 m), weight 223 lb (101.2 kg), SpO2 100 %. BMI: 28.63 kg/m  Risk Assessment: Allergies: Reviewed. He is allergic to statins and sulfa antibiotics.  Allergy Precautions: None required Coagulopathies: Reviewed. None identified.  Blood-thinner therapy: None at this time Active Infection(s): Reviewed. None  identified. Alan Stafford is afebrile  Site Confirmation: Alan Stafford was asked to confirm the procedure and laterality before marking the site Procedure checklist: Completed Consent: Before the procedure and under the influence of no sedative(s), amnesic(s), or anxiolytics, the patient was informed of the treatment options, risks and possible complications. To fulfill our ethical and legal obligations, as recommended by the American Medical Association's Code of Ethics, I have informed the patient of my clinical impression; the nature and purpose of the treatment or procedure; the risks, benefits, and possible complications of the intervention; the alternatives, including doing nothing; the risk(s) and benefit(s) of the alternative treatment(s) or procedure(s); and the risk(s) and benefit(s) of doing nothing. The patient was provided information about the general risks and possible complications associated with the procedure. These may include, but are not limited to: failure to achieve desired goals, infection, bleeding, organ or nerve damage, allergic reactions, paralysis, and death. In addition, the patient was informed of those risks and complications associated to Spine-related procedures, such as failure to decrease pain; infection (i.e.: Meningitis, epidural or intraspinal abscess); bleeding (i.e.: epidural hematoma, subarachnoid hemorrhage, or any other type of intraspinal or peri-dural bleeding); organ or nerve damage (i.e.: Any type of peripheral nerve, nerve root, or spinal cord injury) with subsequent damage to sensory, motor, and/or autonomic systems, resulting in permanent pain, numbness, and/or weakness of one or several areas of the body; allergic reactions; (i.e.: anaphylactic reaction); and/or death. Furthermore, the patient was informed of those risks and complications associated with the medications. These include, but are not limited to: allergic reactions (i.e.: anaphylactic or anaphylactoid  reaction(s)); adrenal axis suppression; blood sugar elevation that in diabetics may result in ketoacidosis or comma; water retention  that in patients with history of congestive heart failure may result in shortness of breath, pulmonary edema, and decompensation with resultant heart failure; weight gain; swelling or edema; medication-induced neural toxicity; particulate matter embolism and blood vessel occlusion with resultant organ, and/or nervous system infarction; and/or aseptic necrosis of one or more joints. Finally, the patient was informed that Medicine is not an exact science; therefore, there is also the possibility of unforeseen or unpredictable risks and/or possible complications that may result in a catastrophic outcome. The patient indicated having understood very clearly. We have given the patient no guarantees and we have made no promises. Enough time was given to the patient to ask questions, all of which were answered to the patient's satisfaction. Alan Stafford has indicated that he wanted to continue with the procedure. Attestation: I, the ordering provider, attest that I have discussed with the patient the benefits, risks, side-effects, alternatives, likelihood of achieving goals, and potential problems during recovery for the procedure that I have provided informed consent. Date: 08/05/2016; Time: 10:52 AM  Pre-Procedure Preparation:  Monitoring: As per clinic protocol. Respiration, ETCO2, SpO2, BP, heart rate and rhythm monitor placed and checked for adequate function Safety Precautions: Patient was assessed for positional comfort and pressure points before starting the procedure. Time-out: I initiated and conducted the "Time-out" before starting the procedure, as per protocol. The patient was asked to participate by confirming the accuracy of the "Time Out" information. Verification of the correct person, site, and procedure were performed and confirmed by me, the nursing staff, and the  patient. "Time-out" conducted as per Joint Commission's Universal Protocol (UP.01.01.01). "Time-out" Date & Time: 08/05/2016; 1138 hrs.  Description of Procedure Process:   Position: Prone with head of the table was raised to facilitate breathing. Target Area: The interlaminar space, initially targeting the lower laminar border of the superior vertebral body. Approach: Paramedial approach. Area Prepped: Entire Posterior Lumbar Region Prepping solution: ChloraPrep (2% chlorhexidine gluconate and 70% isopropyl alcohol) Safety Precautions: Aspiration looking for blood return was conducted prior to all injections. At no point did we inject any substances, as a needle was being advanced. No attempts were made at seeking any paresthesias. Safe injection practices and needle disposal techniques used. Medications properly checked for expiration dates. SDV (single dose vial) medications used. Description of the Procedure: Protocol guidelines were followed. The procedure needle was introduced through the skin, ipsilateral to the reported pain, and advanced to the target area. Bone was contacted and the needle walked caudad, until the lamina was cleared. The epidural space was identified using "loss-of-resistance technique" with 2-3 ml of PF-NaCl (0.9% NSS), in a 5cc LOR glass syringe. Vitals:   08/05/16 1135 08/05/16 1140 08/05/16 1145 08/05/16 1149  BP: (!) 119/92 127/82 138/79 132/86  Pulse: 67 72 69 73  Resp: 13 13 17 17   Temp:      TempSrc:      SpO2: 100% 98% 98% 97%  Weight:      Height:        Start Time: 1138 hrs. End Time: 1145 hrs. Materials:  Needle(s) Type: Epidural needle Gauge: 17G Length: 3.5-in Medication(s): We administered iopamidol, triamcinolone acetonide, lidocaine (PF), sodium chloride flush, and ropivacaine (PF) 2 mg/mL (0.2%). Please see chart orders for dosing details.  Imaging Guidance (Spinal):  Type of Imaging Technique: Fluoroscopy Guidance (Spinal) Indication(s):  Assistance in needle guidance and placement for procedures requiring needle placement in or near specific anatomical locations not easily accessible without such assistance. Exposure Time: Please see nurses  notes. Contrast: Before injecting any contrast, we confirmed that the patient did not have an allergy to iodine, shellfish, or radiological contrast. Once satisfactory needle placement was completed at the desired level, radiological contrast was injected. Contrast injected under live fluoroscopy. No contrast complications. See chart for type and volume of contrast used. Fluoroscopic Guidance: I was personally present during the use of fluoroscopy. "Tunnel Vision Technique" used to obtain the best possible view of the target area. Parallax error corrected before commencing the procedure. "Direction-depth-direction" technique used to introduce the needle under continuous pulsed fluoroscopy. Once target was reached, antero-posterior, oblique, and lateral fluoroscopic projection used confirm needle placement in all planes. Images permanently stored in EMR. Interpretation: I personally interpreted the imaging intraoperatively. Adequate needle placement confirmed in multiple planes. Appropriate spread of contrast into desired area was observed. No evidence of afferent or efferent intravascular uptake. No intrathecal or subarachnoid spread observed. Permanent images saved into the patient's record.  Antibiotic Prophylaxis:  Indication(s): None identified Antibiotic given: None  Post-operative Assessment:  EBL: None Complications: No immediate post-treatment complications observed by team, or reported by patient. Note: The patient tolerated the entire procedure well. A repeat set of vitals were taken after the procedure and the patient was kept under observation following institutional policy, for this type of procedure. Post-procedural neurological assessment was performed, showing return to baseline,  prior to discharge. The patient was provided with post-procedure discharge instructions, including a section on how to identify potential problems. Should any problems arise concerning this procedure, the patient was given instructions to immediately contact us, at any time, without hesitation. In any case, we plan to contact the patient by telephone for a follow-up status report regarding this interventional procedure. Comments:  No additional relevant information.  Plan of Care  Disposition: Discharge home  Discharge Date & Time: 08/05/2016; 1150 hrs.  Physician-requested Follow-up:  Return in about 2 weeks (around 08/19/2016) for post-procedure eval (in 2 wks), by MD.  Future Appointments Date Time Provider Department Center  08/07/2016 11:20 AM Delma FreezeHackney, Tina A, FNP [facility] None  09/10/2016 9:00 AM Delano MetzNaveira, Youcef Klas, MD [facility] None   Medications ordered for procedure: Meds ordered this encounter  Medications  . iopamidol (ISOVUE-M) 41 % intrathecal injection 10 mL  . triamcinolone acetonide (KENALOG-40) injection 40 mg  . lidocaine (PF) (XYLOCAINE) 1 % injection 10 mL  . sodium chloride flush (NS) 0.9 % injection 2 mL  . ropivacaine (PF) 2 mg/mL (0.2%) (NAROPIN) injection 2 mL   Medications administered: We administered iopamidol, triamcinolone acetonide, lidocaine (PF), sodium chloride flush, and ropivacaine (PF) 2 mg/mL (0.2%).  See the medical record for exact dosing, route, and time of administration.  Lab-work, Procedure(s), & Referral(s) Ordered: Orders Placed This Encounter  Procedures  . Lumbar Epidural Injection  . DG C-Arm 1-60 Min-No Report  . Informed Consent Details: Transcribe to consent form and obtain patient signature  . Provider attestation of informed consent for procedure/surgical case  . Verify informed consent  . Discharge instructions  . Follow-up   Imaging Ordered: Results for orders placed in visit on 04/01/16  DG C-Arm 1-60 Min-No Report    Narrative Fluoroscopy was utilized by the requesting physician.  No radiographic  interpretation.    New Prescriptions   No medications on file   Primary Care Physician: Lindwood QuaHoffman, Byron, MD [facility] Note by: Sydnee LevansFrancisco A. Laban EmperorNaveira, M.D, DABA, DABAPM, DABPM, DABIPP, FIPP Date: 08/05/2016; Time: 2:47 PM  Disclaimer:  Medicine is not an Visual merchandiserexact science.  The only guarantee in medicine is that nothing is guaranteed. It is important to note that the decision to proceed with this intervention was based on the information collected from the patient. The Data and conclusions were drawn from the patient's questionnaire, the interview, and the physical examination. Because the information was provided in large part by the patient, it cannot be guaranteed that it has not been purposely or unconsciously manipulated. Every effort has been made to obtain as much relevant data as possible for this evaluation. It is important to note that the conclusions that lead to this procedure are derived in large part from the available data. Always take into account that the treatment will also be dependent on availability of resources and existing treatment guidelines, considered by other Pain Management Practitioners as being common knowledge and practice, at the time of the intervention. For Medico-Legal purposes, it is also important to point out that variation in procedural techniques and pharmacological choices are the acceptable norm. The indications, contraindications, technique, and results of the above procedure should only be interpreted and judged by a Board-Certified Interventional Pain Specialist with extensive familiarity and expertise in the same exact procedure and technique.  Instructions provided at this appointment: Patient Instructions   ____________________________________________________________________________________________  Post-Procedure  instructions Instructions:  Apply ice: Fill a plastic sandwich bag with crushed ice. Cover it with a small towel and apply to injection site. Apply for 15 minutes then remove x 15 minutes. Repeat sequence on day of procedure, until you go to bed. The purpose is to minimize swelling and discomfort after procedure.  Apply heat: Apply heat to procedure site starting the day following the procedure. The purpose is to treat any soreness and discomfort from the procedure.  Food intake: Start with clear liquids (like water) and advance to regular food, as tolerated.   Physical activities: Keep activities to a minimum for the first 8 hours after the procedure.   Driving: If you have received any sedation, you are not allowed to drive for 24 hours after your procedure.  Blood thinner: Restart your blood thinner 6 hours after your procedure. (Only for those taking blood thinners)  Insulin: As soon as you can eat, you may resume your normal dosing schedule. (Only for those taking insulin)  Infection prevention: Keep procedure site clean and dry.  Post-procedure Pain Diary: Extremely important that this be done correctly and accurately. Recorded information will be used to determine the next step in treatment.  Pain evaluated is that of treated area only. Do not include pain from an untreated area.  Complete every hour, on the hour, for the initial 8 hours. Set an alarm to help you do this part accurately.  Do not go to sleep and have it completed later. It will not be accurate.  Follow-up appointment: Keep your follow-up appointment after the procedure. Usually 2 weeks for most procedures. (6 weeks in the case of radiofrequency.) Bring you pain diary.  Expect:  From numbing medicine (AKA: Local Anesthetics): Numbness or decrease in pain.  Onset: Full effect within 15 minutes of injected.  Duration: It will depend on the type of local anesthetic used. On the average, 1 to 8 hours.   From  steroids: Decrease in swelling or inflammation. Once inflammation is improved, relief of the pain will follow.  Onset of benefits: Depends on the amount of swelling present. The more swelling, the longer it will take for the benefits to be seen.   Duration: Steroids will stay in the  system x 2 weeks. Duration of benefits will depend on multiple posibilities including persistent irritating factors.  From procedure: Some discomfort is to be expected once the numbing medicine wears off. This should be minimal if ice and heat are applied as instructed. Call if:  You experience numbness and weakness that gets worse with time, as opposed to wearing off.  New onset bowel or bladder incontinence. (Spinal procedures only)  Emergency Numbers:  Durning business hours (Monday - Thursday, 8:00 AM - 4:00 PM) (Friday, 9:00 AM - 12:00 Noon): [phone]  After hours: [phone] ____________________________________________________________________________________________  Pain Management Discharge Instructions  General Discharge Instructions :  If you need to reach your doctor call: Monday-Friday 8:00 am - 4:00 pm at [phone] or toll free [phone].  After clinic hours [phone] to have operator reach doctor.  Bring all of your medication bottles to all your appointments in the pain clinic.  To cancel or reschedule your appointment with Pain Management please remember to call 24 hours in advance to avoid a fee.  Refer to the educational materials which you have been given on: General Risks, I had my Procedure. Discharge Instructions, Post Sedation.  Post Procedure Instructions:  The drugs you were given will stay in your system until tomorrow, so for the next 24 hours you should not drive, make any legal decisions or drink any alcoholic beverages.  You may eat anything you prefer, but it is better to start with liquids then soups and crackers, and gradually work up to solid  foods.  Please notify your doctor immediately if you have any unusual bleeding, trouble breathing or pain that is not related to your normal pain.  Depending on the type of procedure that was done, some parts of your body may feel week and/or numb.  This usually clears up by tonight or the next day.  Walk with the use of an assistive device or accompanied by an adult for the 24 hours.  You may use ice on the affected area for the first 24 hours.  Put ice in a Ziploc bag and cover with a towel and place against area 15 minutes on 15 minutes off.  You may switch to heat after 24 hours.GENERAL RISKS AND COMPLICATIONS  What are the risk, side effects and possible complications? Generally speaking, most procedures are safe.  However, with any procedure there are risks, side effects, and the possibility of complications.  The risks and complications are dependent upon the sites that are lesioned, or the type of nerve block to be performed.  The closer the procedure is to the spine, the more serious the risks are.  Great care is taken when placing the radio frequency needles, block needles or lesioning probes, but sometimes complications can occur. 1. Infection: Any time there is an injection through the skin, there is a risk of infection.  This is why sterile conditions are used for these blocks.  There are four possible types of infection. 1. Localized skin infection. 2. Central Nervous System Infection-This can be in the form of Meningitis, which can be deadly. 3. Epidural Infections-This can be in the form of an epidural abscess, which can cause pressure inside of the spine, causing compression of the spinal cord with subsequent paralysis. This would require an emergency surgery to decompress, and there are no guarantees that the patient would recover from the paralysis. 4. Discitis-This is an infection of the intervertebral discs.  It occurs in about 1% of discography procedures.  It is  difficult to  treat and it may lead to surgery.        2. Pain: the needles have to go through skin and soft tissues, will cause soreness.       3. Damage to internal structures:  The nerves to be lesioned may be near blood vessels or    other nerves which can be potentially damaged.       4. Bleeding: Bleeding is more common if the patient is taking blood thinners such as  aspirin, Coumadin, Ticiid, Plavix, etc., or if he/she have some genetic predisposition  such as hemophilia. Bleeding into the spinal canal can cause compression of the spinal  cord with subsequent paralysis.  This would require an emergency surgery to  decompress and there are no guarantees that the patient would recover from the  paralysis.       5. Pneumothorax:  Puncturing of a lung is a possibility, every time a needle is introduced in  the area of the chest or upper back.  Pneumothorax refers to free air around the  collapsed lung(s), inside of the thoracic cavity (chest cavity).  Another two possible  complications related to a similar event would include: Hemothorax and Chylothorax.   These are variations of the Pneumothorax, where instead of air around the collapsed  lung(s), you may have blood or chyle, respectively.       6. Spinal headaches: They may occur with any procedures in the area of the spine.       7. Persistent CSF (Cerebro-Spinal Fluid) leakage: This is a rare problem, but may occur  with prolonged intrathecal or epidural catheters either due to the formation of a fistulous  track or a dural tear.       8. Nerve damage: By working so close to the spinal cord, there is always a possibility of  nerve damage, which could be as serious as a permanent spinal cord injury with  paralysis.       9. Death:  Although rare, severe deadly allergic reactions known as "Anaphylactic  reaction" can occur to any of the medications used.      10. Worsening of the symptoms:  We can always make thing worse.  What are the chances of something  like this happening? Chances of any of this occuring are extremely low.  By statistics, you have more of a chance of getting killed in a motor vehicle accident: while driving to the hospital than any of the above occurring .  Nevertheless, you should be aware that they are possibilities.  In general, it is similar to taking a shower.  Everybody knows that you can slip, hit your head and get killed.  Does that mean that you should not shower again?  Nevertheless always keep in mind that statistics do not mean anything if you happen to be on the wrong side of them.  Even if a procedure has a 1 (one) in a 1,000,000 (million) chance of going wrong, it you happen to be that one..Also, keep in mind that by statistics, you have more of a chance of having something go wrong when taking medications.  Who should not have this procedure? If you are on a blood thinning medication (e.g. Coumadin, Plavix, see list of "Blood Thinners"), or if you have an active infection going on, you should not have the procedure.  If you are taking any blood thinners, please inform your physician.  How should I prepare for this procedure?  Do not  eat or drink anything at least six hours prior to the procedure.  Bring a driver with you .  It cannot be a taxi.  Come accompanied by an adult that can drive you back, and that is strong enough to help you if your legs get weak or numb from the local anesthetic.  Take all of your medicines the morning of the procedure with just enough water to swallow them.  If you have diabetes, make sure that you are scheduled to have your procedure done first thing in the morning, whenever possible.  If you have diabetes, take only half of your insulin dose and notify our nurse that you have done so as soon as you arrive at the clinic.  If you are diabetic, but only take blood sugar pills (oral hypoglycemic), then do not take them on the morning of your procedure.  You may take them after you  have had the procedure.  Do not take aspirin or any aspirin-containing medications, at least eleven (11) days prior to the procedure.  They may prolong bleeding.  Wear loose fitting clothing that may be easy to take off and that you would not mind if it got stained with Betadine or blood.  Do not wear any jewelry or perfume  Remove any nail coloring.  It will interfere with some of our monitoring equipment.  NOTE: Remember that this is not meant to be interpreted as a complete list of all possible complications.  Unforeseen problems may occur.  BLOOD THINNERS The following drugs contain aspirin or other products, which can cause increased bleeding during surgery and should not be taken for 2 weeks prior to and 1 week after surgery.  If you should need take something for relief of minor pain, you may take acetaminophen which is found in Tylenol,m Datril, Anacin-3 and Panadol. It is not blood thinner. The products listed below are.  Do not take any of the products listed below in addition to any listed on your instruction sheet.  A.P.C or A.P.C with Codeine Codeine Phosphate Capsules #3 Ibuprofen Ridaura  ABC compound Congesprin Imuran rimadil  Advil Cope Indocin Robaxisal  Alka-Seltzer Effervescent Pain Reliever and Antacid Coricidin or Coricidin-D  Indomethacin Rufen  Alka-Seltzer plus Cold Medicine Cosprin Ketoprofen S-A-C Tablets  Anacin Analgesic Tablets or Capsules Coumadin Korlgesic Salflex  Anacin Extra Strength Analgesic tablets or capsules CP-2 Tablets Lanoril Salicylate  Anaprox Cuprimine Capsules Levenox Salocol  Anexsia-D Dalteparin Magan Salsalate  Anodynos Darvon compound Magnesium Salicylate Sine-off  Ansaid Dasin Capsules Magsal Sodium Salicylate  Anturane Depen Capsules Marnal Soma  APF Arthritis pain formula Dewitt's Pills Measurin Stanback  Argesic Dia-Gesic Meclofenamic Sulfinpyrazone  Arthritis Bayer Timed Release Aspirin Diclofenac Meclomen Sulindac  Arthritis pain  formula Anacin Dicumarol Medipren Supac  Analgesic (Safety coated) Arthralgen Diffunasal Mefanamic Suprofen  Arthritis Strength Bufferin Dihydrocodeine Mepro Compound Suprol  Arthropan liquid Dopirydamole Methcarbomol with Aspirin Synalgos  ASA tablets/Enseals Disalcid Micrainin Tagament  Ascriptin Doan's Midol Talwin  Ascriptin A/D Dolene Mobidin Tanderil  Ascriptin Extra Strength Dolobid Moblgesic Ticlid  Ascriptin with Codeine Doloprin or Doloprin with Codeine Momentum Tolectin  Asperbuf Duoprin Mono-gesic Trendar  Aspergum Duradyne Motrin or Motrin IB Triminicin  Aspirin plain, buffered or enteric coated Durasal Myochrisine Trigesic  Aspirin Suppositories Easprin Nalfon Trillsate  Aspirin with Codeine Ecotrin Regular or Extra Strength Naprosyn Uracel  Atromid-S Efficin Naproxen Ursinus  Auranofin Capsules Elmiron Neocylate Vanquish  Axotal Emagrin Norgesic Verin  Azathioprine Empirin or Empirin with Codeine Normiflo Vitamin E  Azolid  Emprazil Nuprin Voltaren  Bayer Aspirin plain, buffered or children's or timed BC Tablets or powders Encaprin Orgaran Warfarin Sodium  Buff-a-Comp Enoxaparin Orudis Zorpin  Buff-a-Comp with Codeine Equegesic Os-Cal-Gesic   Buffaprin Excedrin plain, buffered or Extra Strength Oxalid   Bufferin Arthritis Strength Feldene Oxphenbutazone   Bufferin plain or Extra Strength Feldene Capsules Oxycodone with Aspirin   Bufferin with Codeine Fenoprofen Fenoprofen Pabalate or Pabalate-SF   Buffets II Flogesic Panagesic   Buffinol plain or Extra Strength Florinal or Florinal with Codeine Panwarfarin   Buf-Tabs Flurbiprofen Penicillamine   Butalbital Compound Four-way cold tablets Penicillin   Butazolidin Fragmin Pepto-Bismol   Carbenicillin Geminisyn Percodan   Carna Arthritis Reliever Geopen Persantine   Carprofen Gold's salt Persistin   Chloramphenicol Goody's Phenylbutazone   Chloromycetin Haltrain Piroxlcam   Clmetidine heparin Plaquenil   Cllnoril  Hyco-pap Ponstel   Clofibrate Hydroxy chloroquine Propoxyphen         Before stopping any of these medications, be sure to consult the physician who ordered them.  Some, such as Coumadin (Warfarin) are ordered to prevent or treat serious conditions such as "deep thrombosis", "pumonary embolisms", and other heart problems.  The amount of time that you may need off of the medication may also vary with the medication and the reason for which you were taking it.  If you are taking any of these medications, please make sure you notify your pain physician before you undergo any procedures.          Epidural Steroid Injection An epidural steroid injection is a shot of steroid medicine and numbing medicine that is given into the space between the spinal cord and the bones in your back (epidural space). The shot helps relieve pain caused by an irritated or swollen nerve root. The amount of pain relief you get from the injection depends on what is causing the nerve to be swollen and irritated, and how long your pain lasts. You are more likely to benefit from this injection if your pain is strong and comes on suddenly rather than if you have had pain for a long time. Tell a health care provider about:  Any allergies you have.  All medicines you are taking, including vitamins, herbs, eye drops, creams, and over-the-counter medicines.  Any problems you or family members have had with anesthetic medicines.  Any blood disorders you have.  Any surgeries you have had.  Any medical conditions you have.  Whether you are pregnant or may be pregnant. What are the risks? Generally, this is a safe procedure. However, problems may occur, including:  Headache.  Bleeding.  Infection.  Allergic reaction to medicines.  Damage to your nerves.  What happens before the procedure? Staying hydrated Follow instructions from your health care provider about hydration, which may include:  Up to 2 hours  before the procedure - you may continue to drink clear liquids, such as water, clear fruit juice, black coffee, and plain tea.  Eating and drinking restrictions Follow instructions from your health care provider about eating and drinking, which may include:  8 hours before the procedure - stop eating heavy meals or foods such as meat, fried foods, or fatty foods.  6 hours before the procedure - stop eating light meals or foods, such as toast or cereal.  6 hours before the procedure - stop drinking milk or drinks that contain milk.  2 hours before the procedure - stop drinking clear liquids.  Medicine  You may be given medicines to lower  anxiety.  Ask your health care provider about: ? Changing or stopping your regular medicines. This is especially important if you are taking diabetes medicines or blood thinners. ? Taking medicines such as aspirin and ibuprofen. These medicines can thin your blood. Do not take these medicines before your procedure if your health care provider instructs you not to. General instructions  Plan to have someone take you home from the hospital or clinic. What happens during the procedure?  You may receive a medicine to help you relax (sedative).  You will be asked to lie on your abdomen.  The injection site will be cleaned.  A numbing medicine (local anesthetic) will be used to numb the injection site.  A needle will be inserted through your skin into the epidural space. You may feel some discomfort when this happens. An X-ray machine will be used to make sure the needle is put as close as possible to the affected nerve.  A steroid medicine and a local anesthetic will be injected into the epidural space.  The needle will be removed.  A bandage (dressing) will be put over the injection site. What happens after the procedure?  Your blood pressure, heart rate, breathing rate, and blood oxygen level will be monitored until the medicines you were given  have worn off.  Your arm or leg may feel weak or numb for a few hours.  The injection site may feel sore.  Do not drive for 24 hours if you received a sedative. This information is not intended to replace advice given to you by your health care provider. Make sure you discuss any questions you have with your health care provider. Document Released: 05/21/2007 Document Revised: 07/26/2015 Document Reviewed: 05/30/2015 Elsevier Interactive Patient Education  2017 [employer].

## 2016-08-06 NOTE — Telephone Encounter (Signed)
Voicemail left for patient to call our office if there are questions or concerns re; procedure on yesterday.  

## 2016-08-06 NOTE — Telephone Encounter (Signed)
I will call him tomorrow when I am in the office  Alan Stafford [place], MD PCCM service Mobile [phone] Pager [phone] 08/06/2016 3:31 PM

## 2016-08-06 NOTE — Telephone Encounter (Signed)
Pt is calling back stating no one has called him back about the initial message about the Grievance   Not sure who Jeannine KittenDelores Patterson is but I think they have noted their notes on wrong phone note.  Patient is confused and would like a call back wanting to know what happen and how come pain clinic is putting information in wrong note.  He is very confused.

## 2016-08-06 NOTE — Telephone Encounter (Signed)
Patient called back to let us know that pain is much better after procedure.  No complaints or concerns at this time.

## 2016-08-06 NOTE — Telephone Encounter (Signed)
Pt calling asking for a call back  Stating he has a grievance with hospital  They told him that he has CHF  He states he is not sure who put that in there but that is not true The hospital stated to him that we put that in there  He needs this straighten out.   Please advise    DS, Please advise on message above. Thanks

## 2016-08-07 ENCOUNTER — Ambulatory Visit: Payer: Medicare Other | Admitting: Family

## 2016-09-03 NOTE — Telephone Encounter (Signed)
NA

## 2016-09-10 ENCOUNTER — Ambulatory Visit: Payer: Medicare Other | Admitting: Pain Medicine

## 2016-09-11 ENCOUNTER — Encounter: Payer: Self-pay | Admitting: *Deleted

## 2016-09-16 ENCOUNTER — Ambulatory Visit: Payer: Medicare Other | Admitting: Pain Medicine

## 2016-09-24 ENCOUNTER — Ambulatory Visit: Payer: Medicare Other | Admitting: Pain Medicine

## 2016-09-26 ENCOUNTER — Encounter
Admission: RE | Disposition: A | Discharge status: Home / Self Care | Payer: Self-pay | Source: Ambulatory Visit | Attending: Ophthalmology

## 2016-09-26 ENCOUNTER — Encounter: Payer: Self-pay | Admitting: *Deleted

## 2016-09-26 ENCOUNTER — Ambulatory Visit: Payer: Medicare Other | Admitting: Anesthesiology

## 2016-09-26 ENCOUNTER — Ambulatory Visit: Payer: Medicare Other | Admitting: Pain Medicine

## 2016-09-26 ENCOUNTER — Ambulatory Visit
Admission: RE | Admit: 2016-09-26 | Discharge: 2016-09-26 | Disposition: A | Discharge status: Home / Self Care | Payer: Medicare Other | Source: Ambulatory Visit | Attending: Ophthalmology | Admitting: Ophthalmology

## 2016-09-26 DIAGNOSIS — Z955 Presence of coronary angioplasty implant and graft: Secondary | ICD-10-CM | POA: Diagnosis not present

## 2016-09-26 DIAGNOSIS — M1990 Unspecified osteoarthritis, unspecified site: Secondary | ICD-10-CM | POA: Insufficient documentation

## 2016-09-26 DIAGNOSIS — I2510 Atherosclerotic heart disease of native coronary artery without angina pectoris: Secondary | ICD-10-CM | POA: Insufficient documentation

## 2016-09-26 DIAGNOSIS — I4891 Unspecified atrial fibrillation: Secondary | ICD-10-CM | POA: Insufficient documentation

## 2016-09-26 DIAGNOSIS — E0590 Thyrotoxicosis, unspecified without thyrotoxic crisis or storm: Secondary | ICD-10-CM | POA: Insufficient documentation

## 2016-09-26 DIAGNOSIS — Z7984 Long term (current) use of oral hypoglycemic drugs: Secondary | ICD-10-CM | POA: Diagnosis not present

## 2016-09-26 DIAGNOSIS — E1136 Type 2 diabetes mellitus with diabetic cataract: Secondary | ICD-10-CM | POA: Diagnosis not present

## 2016-09-26 DIAGNOSIS — I10 Essential (primary) hypertension: Secondary | ICD-10-CM | POA: Insufficient documentation

## 2016-09-26 DIAGNOSIS — K219 Gastro-esophageal reflux disease without esophagitis: Secondary | ICD-10-CM | POA: Insufficient documentation

## 2016-09-26 DIAGNOSIS — J45909 Unspecified asthma, uncomplicated: Secondary | ICD-10-CM | POA: Insufficient documentation

## 2016-09-26 DIAGNOSIS — E785 Hyperlipidemia, unspecified: Secondary | ICD-10-CM | POA: Diagnosis not present

## 2016-09-26 HISTORY — PX: CATARACT EXTRACTION W/PHACO: SHX586

## 2016-09-26 HISTORY — DX: Cardiac arrhythmia, unspecified: I49.9

## 2016-09-26 LAB — GLUCOSE, CAPILLARY: GLUCOSE-CAPILLARY: 144 mg/dL — AB (ref 65–99)

## 2016-09-26 SURGERY — PHACOEMULSIFICATION, CATARACT, WITH IOL INSERTION
Anesthesia: Monitor Anesthesia Care | Site: Eye | Laterality: Left | Wound class: Clean

## 2016-09-26 MED ORDER — FENTANYL CITRATE (PF) 100 MCG/2ML IJ SOLN
INTRAMUSCULAR | Status: AC
  Filled 2016-09-26: qty 2

## 2016-09-26 MED ORDER — LIDOCAINE HCL (PF) 4 % IJ SOLN
INTRAMUSCULAR | Status: AC
  Filled 2016-09-26: qty 5

## 2016-09-26 MED ORDER — CARBACHOL 0.01 % IO SOLN
INTRAOCULAR | Status: DC | PRN
  Administered 2016-09-26: 0.5 mL via INTRAOCULAR

## 2016-09-26 MED ORDER — SODIUM HYALURONATE 23 MG/ML IO SOLN
INTRAOCULAR | Status: AC
  Filled 2016-09-26: qty 0.6

## 2016-09-26 MED ORDER — FENTANYL CITRATE (PF) 100 MCG/2ML IJ SOLN
INTRAMUSCULAR | Status: DC | PRN
  Administered 2016-09-26 (×2): 50 ug via INTRAVENOUS

## 2016-09-26 MED ORDER — ARMC OPHTHALMIC DILATING DROPS
1.0000 "application " | OPHTHALMIC | Status: AC
  Administered 2016-09-26 (×3): 1 via OPHTHALMIC

## 2016-09-26 MED ORDER — EPINEPHRINE PF 1 MG/ML IJ SOLN
INTRAMUSCULAR | Status: DC | PRN
  Administered 2016-09-26: 08:00:00 via OPHTHALMIC

## 2016-09-26 MED ORDER — SODIUM CHLORIDE 0.9 % IV SOLN
INTRAVENOUS | Status: DC
  Administered 2016-09-26: 50 mL/h via INTRAVENOUS

## 2016-09-26 MED ORDER — ARMC OPHTHALMIC DILATING DROPS
OPHTHALMIC | Status: AC
  Administered 2016-09-26: 1 via OPHTHALMIC
  Filled 2016-09-26: qty 0.4

## 2016-09-26 MED ORDER — POVIDONE-IODINE 5 % OP SOLN
OPHTHALMIC | Status: AC
  Filled 2016-09-26: qty 30

## 2016-09-26 MED ORDER — EPINEPHRINE PF 1 MG/ML IJ SOLN
INTRAMUSCULAR | Status: AC
  Filled 2016-09-26: qty 1

## 2016-09-26 MED ORDER — MOXIFLOXACIN HCL 0.5 % OP SOLN
OPHTHALMIC | Status: DC | PRN
Start: 2016-09-26 — End: 2016-09-26
  Administered 2016-09-26: 0.2 mL via OPHTHALMIC

## 2016-09-26 MED ORDER — NA CHONDROIT SULF-NA HYALURON 40-30 MG/ML IO SOLN
INTRAOCULAR | Status: DC | PRN
  Administered 2016-09-26: 1 mL via INTRAOCULAR

## 2016-09-26 MED ORDER — TETRACAINE HCL 0.5 % OP SOLN
OPHTHALMIC | Status: AC
  Filled 2016-09-26: qty 4

## 2016-09-26 MED ORDER — MOXIFLOXACIN HCL 0.5 % OP SOLN
OPHTHALMIC | Status: AC
  Filled 2016-09-26: qty 3

## 2016-09-26 MED ORDER — MOXIFLOXACIN HCL 0.5 % OP SOLN: 1.0000 [drp] | OPHTHALMIC | Status: DC | PRN

## 2016-09-26 MED ORDER — MIDAZOLAM HCL 2 MG/2ML IJ SOLN
INTRAMUSCULAR | Status: AC
  Filled 2016-09-26: qty 2

## 2016-09-26 MED ORDER — BSS IO SOLN
INTRAOCULAR | Status: DC | PRN
  Administered 2016-09-26: 4 mL via OPHTHALMIC

## 2016-09-26 MED ORDER — POVIDONE-IODINE 5 % OP SOLN
OPHTHALMIC | Status: DC | PRN
  Administered 2016-09-26: 1 via OPHTHALMIC

## 2016-09-26 SURGICAL SUPPLY — 17 items
DISSECTOR HYDRO NUCLEUS 50X22 (MISCELLANEOUS) ×2 IMPLANT
GLOVE BIO SURGEON STRL SZ8 (GLOVE) ×2 IMPLANT
GLOVE BIOGEL M 6.5 STRL (GLOVE) ×2 IMPLANT
GLOVE SURG LX 7.5 STRW (GLOVE) ×1
GLOVE SURG LX STRL 7.5 STRW (GLOVE) ×1 IMPLANT
GOWN STRL REUS W/ TWL LRG LVL3 (GOWN DISPOSABLE) ×2 IMPLANT
GOWN STRL REUS W/TWL LRG LVL3 (GOWN DISPOSABLE) ×2
LABEL CATARACT MEDS ST (LABEL) ×2 IMPLANT
LENS IOL ACRSF IQ ULTRA 17.0 (Intraocular Lens) ×1 IMPLANT
LENS IOL ACRYSOF IQ 17.0 (Intraocular Lens) ×2 IMPLANT
PACK CATARACT (MISCELLANEOUS) ×2 IMPLANT
PACK CATARACT KING (MISCELLANEOUS) ×2 IMPLANT
PACK EYE AFTER SURG (MISCELLANEOUS) ×2 IMPLANT
SOL BSS BAG (MISCELLANEOUS) ×2
SOLUTION BSS BAG (MISCELLANEOUS) ×1 IMPLANT
WATER STERILE IRR 250ML POUR (IV SOLUTION) ×2 IMPLANT
WIPE NON LINTING 3.25X3.25 (MISCELLANEOUS) ×2 IMPLANT

## 2016-09-26 NOTE — Op Note (Signed)
OPERATIVE NOTE  Angelique BlonderSteve D Martha [id number] 09/26/2016   PREOPERATIVE DIAGNOSIS:  Nuclear sclerotic cataract left eye.  H25.12   POSTOPERATIVE DIAGNOSIS:    Nuclear sclerotic cataract left eye.     PROCEDURE:  Phacoemusification with posterior chamber intraocular lens placement of the left eye   LENS:   Implant Name Type Inv. Item Serial No. Manufacturer Lot No. LRB No. Used  LENS IOL ACRYSOF IQ 17.0 - [id number] Intraocular Lens LENS IOL ACRYSOF IQ 17.0 [id number] ALCON   Left 1       AU00T0 +17.0 D IOL   ULTRASOUND TIME: 0 minutes 52 seconds.  CDE 8.86   SURGEON:  Willey BladeBradley Tyquasia Pant, MD, MPH   ANESTHESIA:  Topical with tetracaine drops augmented with 1% preservative-free intracameral lidocaine.  ESTIMATED BLOOD LOSS: <1 mL   COMPLICATIONS:  None.   DESCRIPTION OF PROCEDURE:  The patient was identified in the holding room and transported to the operating room and placed in the supine position under the operating microscope.  The left eye was identified as the operative eye and it was prepped and draped in the usual sterile ophthalmic fashion.   A 1.0 millimeter clear-corneal paracentesis was made at the 5:00 position. 0.5 ml of preservative-free 1% lidocaine with epinephrine was injected into the anterior chamber.  The anterior chamber was filled with Discovisc viscoelastic.  A 2.4 millimeter keratome was used to make a near-clear corneal incision at the 2:00 position.  A curvilinear capsulorrhexis was made with a cystotome and capsulorrhexis forceps.  Balanced salt solution was used to hydrodissect and hydrodelineate the nucleus.   Phacoemulsification was then used in stop and chop fashion to remove the lens nucleus and epinucleus.  The remaining cortex was then removed using the irrigation and aspiration handpiece. Discovisc was then placed into the capsular bag to distend it for lens placement.  A lens was then injected into the capsular bag.  The remaining viscoelastic was  aspirated.   Wounds were hydrated with balanced salt solution.  The anterior chamber was inflated to a physiologic pressure with balanced salt solution.   Intracameral vigamox 0.1 mL undiltued was injected into the eye and a drop placed onto the ocular surface.  No wound leaks were noted.  The patient was taken to the recovery room in stable condition without complications of anesthesia or surgery  Willey BladeBradley Herberto Ledwell 09/26/2016, 8:03 AM

## 2016-09-26 NOTE — Anesthesia Procedure Notes (Signed)
Procedure Name: MAC Performed by: Jonna Clark Pre-anesthesia Checklist: Patient identified, Emergency Drugs available, Suction available, Patient being monitored and Timeout performed Patient Re-evaluated:Patient Re-evaluated prior to induction Oxygen Delivery Method: Nasal cannula

## 2016-09-26 NOTE — Anesthesia Postprocedure Evaluation (Signed)
Anesthesia Post Note  Patient: Alan Stafford  Procedure(s) Performed: Procedure(s) (LRB): CATARACT EXTRACTION PHACO AND INTRAOCULAR LENS PLACEMENT (IOC) (Left)  Patient location during evaluation: Short Stay Anesthesia Type: MAC Level of consciousness: awake, awake and alert and oriented Pain management: pain level controlled Vital Signs Assessment: post-procedure vital signs reviewed and stable Respiratory status: spontaneous breathing Cardiovascular status: stable Postop Assessment: no headache and adequate PO intake Anesthetic complications: no     Last Vitals:  Vitals:   09/26/16 0803 09/26/16 0807  BP: (!) 104/58 (!) 104/58  Pulse:  60  Resp: 16 18  Temp:  (!) 36.4 C    Last Pain:  Vitals:   09/26/16 0807  TempSrc: Burnett Corrente

## 2016-09-26 NOTE — Anesthesia Post-op Follow-up Note (Cosign Needed)
Anesthesia QCDR form completed.        

## 2016-09-26 NOTE — Transfer of Care (Signed)
Immediate Anesthesia Transfer of Care Note  Patient: Alan Stafford  Procedure(s) Performed: Procedure(s) with comments: CATARACT EXTRACTION PHACO AND INTRAOCULAR LENS PLACEMENT (IOC) (Left) - [place] 00:52.0 AP% 17.0 CDE 8.86 Fluid pack lot # [id number]  Patient Location: PACU  Anesthesia Type:MAC  Level of Consciousness: awake, alert  and oriented  Airway & Oxygen Therapy: Patient Spontanous Breathing  Post-op Assessment: Report given to RN and Post -op Vital signs reviewed and stable  Post vital signs: Reviewed and stable  Last Vitals:  Vitals:   09/26/16 0803 09/26/16 0807  BP: (!) 104/58 (!) 104/58  Pulse:  60  Resp: 16 18  Temp:  (!) 36.4 C    Last Pain:  Vitals:   09/26/16 0807  TempSrc: Oral      Patients Stated Pain Goal: 0 (85/46/27 0350)  Complications: No apparent anesthesia complications

## 2016-09-26 NOTE — Discharge Instructions (Signed)

## 2016-09-26 NOTE — H&P (Signed)
The History and Physical notes are on paper, have been signed, and are to be scanned.   I have examined the patient and there are no changes to the H&P.   Willey BladeBradley King 09/26/2016 7:22 AM

## 2016-09-26 NOTE — Anesthesia Preprocedure Evaluation (Signed)
Anesthesia Evaluation  Patient identified by MRN, date of birth, ID band Patient awake    Reviewed: Allergy & Precautions, NPO status , Patient's Chart, lab work & pertinent test results  History of Anesthesia Complications Negative for: history of anesthetic complications  Airway Mallampati: III  TM Distance: >3 FB Neck ROM: Full    Dental  (+) Poor Dentition   Pulmonary asthma , neg sleep apnea, neg COPD,    breath sounds clear to auscultation- rhonchi (-) wheezing      Cardiovascular hypertension, Pt. on medications + CAD (nonocclusive)  (-) Past MI, (-) Cardiac Stents and (-) CABG + dysrhythmias Atrial Fibrillation  Rhythm:Regular Rate:Normal - Systolic murmurs and - Diastolic murmurs    Neuro/Psych negative neurological ROS  negative psych ROS   GI/Hepatic Neg liver ROS, GERD  ,  Endo/Other  diabetes, Oral Hypoglycemic AgentsHyperthyroidism   Renal/GU Renal InsufficiencyRenal disease     Musculoskeletal  (+) Arthritis ,   Abdominal (+) - obese,   Peds  Hematology negative hematology ROS (+)   Anesthesia Other Findings Past Medical History: No date: A-fib (HCC) No date: Abnormal heart rhythm No date: Arthritis No date: Asthma 04/22/2016: CAP (community acquired pneumonia) No date: Diabetes mellitus without complication (HCC) No date: Dysrhythmia     Comment:  afib treated with flecainide No date: GERD (gastroesophageal reflux disease) 04/22/2016: Hemoptysis No date: Hypercholesteremia No date: Hypertension No date: Hyperthyroidism No date: Kidney stones 04/22/2016: Sepsis ([place])   Reproductive/Obstetrics                             Anesthesia Physical Anesthesia Plan  ASA: III  Anesthesia Plan: MAC   Post-op Pain Management:    Induction: Intravenous  PONV Risk Score and Plan: 1 and Midazolam  Airway Management Planned: Natural Airway  Additional Equipment:    Intra-op Plan:   Post-operative Plan:   Informed Consent: I have reviewed the patients History and Physical, chart, labs and discussed the procedure including the risks, benefits and alternatives for the proposed anesthesia with the patient or authorized representative who has indicated his/her understanding and acceptance.     Plan Discussed with: CRNA and Anesthesiologist  Anesthesia Plan Comments:         Anesthesia Quick Evaluation

## 2016-10-10 ENCOUNTER — Ambulatory Visit: Payer: Medicare Other | Admitting: Pain Medicine

## 2016-10-17 ENCOUNTER — Ambulatory Visit (HOSPITAL_BASED_OUTPATIENT_CLINIC_OR_DEPARTMENT_OTHER): Payer: Medicare Other | Admitting: Pain Medicine

## 2016-10-17 ENCOUNTER — Ambulatory Visit
Admission: RE | Admit: 2016-10-17 | Discharge: 2016-10-17 | Disposition: A | Discharge status: Home / Self Care | Payer: Medicare Other | Source: Ambulatory Visit | Attending: Pain Medicine | Admitting: Pain Medicine

## 2016-10-17 ENCOUNTER — Encounter: Payer: Self-pay | Admitting: Pain Medicine

## 2016-10-17 VITALS — BP 129/71 | HR 74 | Temp 98.2°F | Resp 16 | Ht 74.0 in | Wt 218.0 lb

## 2016-10-17 DIAGNOSIS — M79605 Pain in left leg: Secondary | ICD-10-CM | POA: Diagnosis not present

## 2016-10-17 DIAGNOSIS — M5126 Other intervertebral disc displacement, lumbar region: Secondary | ICD-10-CM

## 2016-10-17 DIAGNOSIS — G8929 Other chronic pain: Secondary | ICD-10-CM

## 2016-10-17 DIAGNOSIS — M47816 Spondylosis without myelopathy or radiculopathy, lumbar region: Secondary | ICD-10-CM

## 2016-10-17 DIAGNOSIS — M5416 Radiculopathy, lumbar region: Secondary | ICD-10-CM

## 2016-10-17 DIAGNOSIS — M5441 Lumbago with sciatica, right side: Secondary | ICD-10-CM | POA: Insufficient documentation

## 2016-10-17 DIAGNOSIS — M79604 Pain in right leg: Secondary | ICD-10-CM | POA: Diagnosis not present

## 2016-10-17 DIAGNOSIS — M48061 Spinal stenosis, lumbar region without neurogenic claudication: Secondary | ICD-10-CM

## 2016-10-17 DIAGNOSIS — M5136 Other intervertebral disc degeneration, lumbar region: Secondary | ICD-10-CM

## 2016-10-17 DIAGNOSIS — M4726 Other spondylosis with radiculopathy, lumbar region: Secondary | ICD-10-CM | POA: Diagnosis not present

## 2016-10-17 MED ORDER — SODIUM CHLORIDE 0.9% FLUSH
2.0000 mL | Freq: Once | INTRAVENOUS | Status: AC
  Administered 2016-10-17: 2 mL

## 2016-10-17 MED ORDER — SODIUM CHLORIDE 0.9 % IJ SOLN
INTRAMUSCULAR | Status: AC
Start: 2016-10-17 — End: ?
  Filled 2016-10-17: qty 10

## 2016-10-17 MED ORDER — LIDOCAINE HCL 2 % IJ SOLN
10.0000 mL | Freq: Once | INTRAMUSCULAR | Status: AC
  Administered 2016-10-17: 400 mg

## 2016-10-17 MED ORDER — IOPAMIDOL (ISOVUE-M 200) INJECTION 41%
10.0000 mL | Freq: Once | INTRAMUSCULAR | Status: AC
  Administered 2016-10-17: 10 mL via EPIDURAL
  Filled 2016-10-17: qty 10

## 2016-10-17 MED ORDER — ROPIVACAINE HCL 2 MG/ML IJ SOLN
2.0000 mL | Freq: Once | INTRAMUSCULAR | Status: AC
Start: 2016-10-17 — End: 2016-10-17
  Administered 2016-10-17: 2 mL via EPIDURAL
  Filled 2016-10-17: qty 10

## 2016-10-17 MED ORDER — TRIAMCINOLONE ACETONIDE 40 MG/ML IJ SUSP
40.0000 mg | Freq: Once | INTRAMUSCULAR | Status: AC
  Administered 2016-10-17: 40 mg
  Filled 2016-10-17: qty 1

## 2016-10-17 NOTE — Progress Notes (Signed)
Safety precautions to be maintained throughout the outpatient stay will include: orient to surroundings, keep bed in low position, maintain call bell within reach at all times, provide assistance with transfer out of bed and ambulation.  

## 2016-10-17 NOTE — Patient Instructions (Signed)

## 2016-10-17 NOTE — Progress Notes (Signed)
Patient's Name: Alan Stafford  MRN: [id number]  Referring Provider: Delano Metz, MD  DOB: February 26, 1945  PCP: Lindwood Qua, MD  DOS: 10/17/2016  Note by: Oswaldo Done, MD  Service setting: Ambulatory outpatient  Specialty: Interventional Pain Management  Patient type: Established  Location: [facility] (AMB) Pain Management Facility  Visit type: Interventional Procedure   Primary Reason for Visit: Interventional Pain Management Treatment. CC: Back Pain (low left)  Procedure:  Anesthesia, Analgesia, Anxiolysis:  Type: Palliative Inter-Laminar Epidural Steroid Injection Region: Lumbar Level: L4-5 Level. Laterality: Left Paramedial. Usually the patient will have the epidural on the left side, however recently he has been experiencing pain bilaterally and today the pain is worse on the left side. A recent MRI reveals multilevel bilateral foraminal stenosis.   Type: Local Anesthesia Local Anesthetic: Lidocaine 1% Route: Infiltration ([place]/IM) IV Access: Declined Sedation: Declined  Indication(s): Analgesia          Indications: 1. Chronic lumbar radicular pain (Right)   2. Chronic lower extremity pain (Location of Primary Source of Pain) (Bilateral) (R>L)   3. 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 Lumbar spondylosis   5. Bulging lumbar disc (T12-L1, L1-2, L2-3, and L4-5)   6. Chronic bilateral low back pain with right-sided sciatica    Pain Score: Pre-procedure: 4 /10 Post-procedure: 0-No pain/10  Pre-op Assessment:  Alan Stafford is a 71 y.o. (year old), male patient, seen today for interventional treatment. He  has a past surgical history that includes Knee surgery (Right, 2015); Eye surgery (Right, 2015); Joint replacement (Right); and Cataract extraction w/PHACO (Left, 09/26/2016). Alan Stafford has a current medication list which includes the following prescription(s): albuterol, aspirin ec, fifty50 glucose meter 2.0, enalapril, escitalopram, flecainide, glucose blood, glucose  blood, glucose blood, hydrocodone-acetaminophen, metformin, methimazole, metoprolol succinate, nicotine, nitroglycerin, omeprazole, pregabalin, sildenafil, testosterone cypionate, and warfarin. His primarily concern today is the Back Pain (low left)  In view of the results of his recent MRI, today we will switch to do in the epidural on the left side as that is decided seems to be clinically affected today. Usually this patient does not keep his post procedure follow-up appointments but I have encouraged him to do so this time so that we can go over the MRI and his treatment alternatives when he comes to our interventional options. He did mention that he has been offered surgery and I have reminded him that I can assist the surgeon pinpointing the area where the symptoms are originating, thereby assisting him in planning a targeted surgery.  Initial Vital Signs: Blood pressure 132/63, pulse 75, temperature 98.2 F (36.8 C), resp. rate 18, height 6\' 2"  (1.88 m), weight 218 lb (98.9 kg), SpO2 98 %. BMI: Estimated body mass index is 27.99 kg/m as calculated from the following:   Height as of this encounter: 6\' 2"  (1.88 m).   Weight as of this encounter: 218 lb (98.9 kg).  Risk Assessment: Allergies: Reviewed. He is allergic to statins and sulfa antibiotics.  Allergy Precautions: None required Coagulopathies: Reviewed. None identified.  Blood-thinner therapy: None at this time Active Infection(s): Reviewed. None identified. Alan Stafford is afebrile  Site Confirmation: Alan Stafford was asked to confirm the procedure and laterality before marking the site Procedure checklist: Completed Consent: Before the procedure and under the influence of no sedative(s), amnesic(s), or anxiolytics, the patient was informed of the treatment options, risks and possible complications. To fulfill our ethical and legal obligations, as recommended by the American Medical Association's Code  of Ethics, I have informed the patient  of my clinical impression; the nature and purpose of the treatment or procedure; the risks, benefits, and possible complications of the intervention; the alternatives, including doing nothing; the risk(s) and benefit(s) of the alternative treatment(s) or procedure(s); and the risk(s) and benefit(s) of doing nothing. The patient was provided information about the general risks and possible complications associated with the procedure. These may include, but are not limited to: failure to achieve desired goals, infection, bleeding, organ or nerve damage, allergic reactions, paralysis, and death. In addition, the patient was informed of those risks and complications associated to Spine-related procedures, such as failure to decrease pain; infection (i.e.: Meningitis, epidural or intraspinal abscess); bleeding (i.e.: epidural hematoma, subarachnoid hemorrhage, or any other type of intraspinal or peri-dural bleeding); organ or nerve damage (i.e.: Any type of peripheral nerve, nerve root, or spinal cord injury) with subsequent damage to sensory, motor, and/or autonomic systems, resulting in permanent pain, numbness, and/or weakness of one or several areas of the body; allergic reactions; (i.e.: anaphylactic reaction); and/or death. Furthermore, the patient was informed of those risks and complications associated with the medications. These include, but are not limited to: allergic reactions (i.e.: anaphylactic or anaphylactoid reaction(s)); adrenal axis suppression; blood sugar elevation that in diabetics may result in ketoacidosis or comma; water retention that in patients with history of congestive heart failure may result in shortness of breath, pulmonary edema, and decompensation with resultant heart failure; weight gain; swelling or edema; medication-induced neural toxicity; particulate matter embolism and blood vessel occlusion with resultant organ, and/or nervous system infarction; and/or aseptic necrosis of one  or more joints. Finally, the patient was informed that Medicine is not an exact science; therefore, there is also the possibility of unforeseen or unpredictable risks and/or possible complications that may result in a catastrophic outcome. The patient indicated having understood very clearly. We have given the patient no guarantees and we have made no promises. Enough time was given to the patient to ask questions, all of which were answered to the patient's satisfaction. Mr. Glaus has indicated that he wanted to continue with the procedure. Attestation: I, the ordering provider, attest that I have discussed with the patient the benefits, risks, side-effects, alternatives, likelihood of achieving goals, and potential problems during recovery for the procedure that I have provided informed consent. Date: 10/17/2016; Time: 11:19 AM  Pre-Procedure Preparation:  Monitoring: As per clinic protocol. Respiration, ETCO2, SpO2, BP, heart rate and rhythm monitor placed and checked for adequate function Safety Precautions: Patient was assessed for positional comfort and pressure points before starting the procedure. Time-out: I initiated and conducted the "Time-out" before starting the procedure, as per protocol. The patient was asked to participate by confirming the accuracy of the "Time Out" information. Verification of the correct person, site, and procedure were performed and confirmed by me, the nursing staff, and the patient. "Time-out" conducted as per Joint Commission's Universal Protocol (UP.01.01.01). "Time-out" Date & Time: 10/17/2016; 1150 hrs.  Description of Procedure Process:   Position: Prone with head of the table was raised to facilitate breathing. Target Area: The interlaminar space, initially targeting the lower laminar border of the superior vertebral body. Approach: Paramedial approach. Area Prepped: Entire Posterior Lumbar Region Prepping solution: ChloraPrep (2% chlorhexidine gluconate and  70% isopropyl alcohol) Safety Precautions: Aspiration looking for blood return was conducted prior to all injections. At no point did we inject any substances, as a needle was being advanced. No attempts were made at seeking any  paresthesias. Safe injection practices and needle disposal techniques used. Medications properly checked for expiration dates. SDV (single dose vial) medications used. Description of the Procedure: Protocol guidelines were followed. The procedure needle was introduced through the skin, ipsilateral to the reported pain, and advanced to the target area. Bone was contacted and the needle walked caudad, until the lamina was cleared. The epidural space was identified using "loss-of-resistance technique" with 2-3 ml of PF-NaCl (0.9% NSS), in a 5cc LOR glass syringe. Vitals:   10/17/16 1103 10/17/16 1140 10/17/16 1150 10/17/16 1200  BP: 132/63 132/81 132/72 129/71  Pulse: 75 78 76 74  Resp: 18 14 16 16   Temp: 98.2 F (36.8 C)     SpO2: 98% 98% 97% 97%  Weight: 218 lb (98.9 kg)     Height: 6\' 2"  (1.88 m)       Start Time: 1150 hrs. End Time: 1158 hrs. Materials:  Needle(s) Type: Epidural needle Gauge: 17G Length: 3.5-in Medication(s): We administered iopamidol, triamcinolone acetonide, ropivacaine (PF) 2 mg/mL (0.2%), sodium chloride flush, and lidocaine. Please see chart orders for dosing details.  Imaging Guidance (Spinal):  Type of Imaging Technique: Fluoroscopy Guidance (Spinal) Indication(s): Assistance in needle guidance and placement for procedures requiring needle placement in or near specific anatomical locations not easily accessible without such assistance. Exposure Time: Please see nurses notes. Contrast: Before injecting any contrast, we confirmed that the patient did not have an allergy to iodine, shellfish, or radiological contrast. Once satisfactory needle placement was completed at the desired level, radiological contrast was injected. Contrast injected  under live fluoroscopy. No contrast complications. See chart for type and volume of contrast used. Fluoroscopic Guidance: I was personally present during the use of fluoroscopy. "Tunnel Vision Technique" used to obtain the best possible view of the target area. Parallax error corrected before commencing the procedure. "Direction-depth-direction" technique used to introduce the needle under continuous pulsed fluoroscopy. Once target was reached, antero-posterior, oblique, and lateral fluoroscopic projection used confirm needle placement in all planes. Images permanently stored in EMR. Interpretation: I personally interpreted the imaging intraoperatively. Adequate needle placement confirmed in multiple planes. Appropriate spread of contrast into desired area was observed. No evidence of afferent or efferent intravascular uptake. No intrathecal or subarachnoid spread observed. Permanent images saved into the patient's record.  Antibiotic Prophylaxis:  Indication(s): None identified Antibiotic given: None  Post-operative Assessment:  EBL: None Complications: No immediate post-treatment complications observed by team, or reported by patient. Note: The patient tolerated the entire procedure well. A repeat set of vitals were taken after the procedure and the patient was kept under observation following institutional policy, for this type of procedure. Post-procedural neurological assessment was performed, showing return to baseline, prior to discharge. The patient was provided with post-procedure discharge instructions, including a section on how to identify potential problems. Should any problems arise concerning this procedure, the patient was given instructions to immediately contact us, at any time, without hesitation. In any case, we plan to contact the patient by telephone for a follow-up status report regarding this interventional procedure. Comments:  No additional relevant information.  Plan of Care   Disposition: Discharge home  Discharge Date & Time: 10/17/2016; 1205 hrs.  Physician-requested Follow-up:  Return for post-procedure eval by Dr. Laban Emperor in 2 weeks.  Future Appointments Date Time Provider Department Center  11/20/2016 11:15 AM Delano Metz, MD [facility] None    Imaging Orders     DG C-Arm 1-60 Min-No Report  Procedure Orders     Lumbar Epidural  Injection  Medications ordered for procedure: Meds ordered this encounter  Medications  . iopamidol (ISOVUE-M) 41 % intrathecal injection 10 mL  . triamcinolone acetonide (KENALOG-40) injection 40 mg  . ropivacaine (PF) 2 mg/mL (0.2%) (NAROPIN) injection 2 mL  . sodium chloride flush (NS) 0.9 % injection 2 mL  . lidocaine (XYLOCAINE) 2 % (with pres) injection 200 mg   Medications administered: We administered iopamidol, triamcinolone acetonide, ropivacaine (PF) 2 mg/mL (0.2%), sodium chloride flush, and lidocaine.  See the medical record for exact dosing, route, and time of administration.  New Prescriptions   No medications on file   Primary Care Physician: Lindwood Qua, MD Location: [facility] Note by: Oswaldo Done, MD Date: 10/17/2016; Time: 12:24 PM  Disclaimer:  Medicine is not an exact science. The only guarantee in medicine is that nothing is guaranteed. It is important to note that the decision to proceed with this intervention was based on the information collected from the patient. The Data and conclusions were drawn from the patient's questionnaire, the interview, and the physical examination. Because the information was provided in large part by the patient, it cannot be guaranteed that it has not been purposely or unconsciously manipulated. Every effort has been made to obtain as much relevant data as possible for this evaluation. It is important to note that the conclusions that lead to this procedure are derived in large part from the available data. Always take  into account that the treatment will also be dependent on availability of resources and existing treatment guidelines, considered by other Pain Management Practitioners as being common knowledge and practice, at the time of the intervention. For Medico-Legal purposes, it is also important to point out that variation in procedural techniques and pharmacological choices are the acceptable norm. The indications, contraindications, technique, and results of the above procedure should only be interpreted and judged by a Board-Certified Interventional Pain Specialist with extensive familiarity and expertise in the same exact procedure and technique.

## 2016-10-18 ENCOUNTER — Telehealth: Payer: Self-pay

## 2016-10-18 NOTE — Telephone Encounter (Signed)
Post procedure phone call.  Left message.  

## 2016-11-19 ENCOUNTER — Telehealth: Payer: Self-pay | Admitting: *Deleted

## 2016-11-20 ENCOUNTER — Ambulatory Visit: Payer: Medicare Other | Admitting: Pain Medicine

## 2017-06-17 ENCOUNTER — Telehealth: Payer: Self-pay | Admitting: Pain Medicine

## 2017-06-17 NOTE — Telephone Encounter (Signed)
Patient lvmail stating he would like to come in for epidural. Tried to call patient to schedule appt. Vmail is full and unable to leave msg

## 2017-07-03 ENCOUNTER — Ambulatory Visit: Payer: Medicare Other | Admitting: Pain Medicine

## 2017-07-10 ENCOUNTER — Ambulatory Visit: Payer: Medicare Other | Admitting: Pain Medicine

## 2017-07-17 ENCOUNTER — Encounter: Payer: Self-pay | Admitting: Pain Medicine

## 2017-07-17 ENCOUNTER — Ambulatory Visit
Admission: RE | Admit: 2017-07-17 | Discharge: 2017-07-17 | Disposition: A | Discharge status: Home / Self Care | Payer: Medicare Other | Source: Ambulatory Visit | Attending: Pain Medicine | Admitting: Pain Medicine

## 2017-07-17 ENCOUNTER — Ambulatory Visit (HOSPITAL_BASED_OUTPATIENT_CLINIC_OR_DEPARTMENT_OTHER): Payer: Medicare Other | Admitting: Pain Medicine

## 2017-07-17 VITALS — BP 122/73 | HR 70 | Temp 97.9°F | Resp 16 | Ht 74.0 in | Wt 218.0 lb

## 2017-07-17 DIAGNOSIS — M5136 Other intervertebral disc degeneration, lumbar region: Secondary | ICD-10-CM | POA: Insufficient documentation

## 2017-07-17 DIAGNOSIS — Z888 Allergy status to other drugs, medicaments and biological substances status: Secondary | ICD-10-CM | POA: Diagnosis not present

## 2017-07-17 DIAGNOSIS — Z9841 Cataract extraction status, right eye: Secondary | ICD-10-CM | POA: Diagnosis not present

## 2017-07-17 DIAGNOSIS — M48061 Spinal stenosis, lumbar region without neurogenic claudication: Secondary | ICD-10-CM

## 2017-07-17 DIAGNOSIS — Z882 Allergy status to sulfonamides status: Secondary | ICD-10-CM | POA: Insufficient documentation

## 2017-07-17 DIAGNOSIS — Z9660 Presence of unspecified orthopedic joint implant: Secondary | ICD-10-CM | POA: Diagnosis not present

## 2017-07-17 DIAGNOSIS — M9983 Other biomechanical lesions of lumbar region: Secondary | ICD-10-CM | POA: Diagnosis not present

## 2017-07-17 DIAGNOSIS — M51369 Other intervertebral disc degeneration, lumbar region without mention of lumbar back pain or lower extremity pain: Secondary | ICD-10-CM

## 2017-07-17 DIAGNOSIS — M5126 Other intervertebral disc displacement, lumbar region: Secondary | ICD-10-CM | POA: Diagnosis not present

## 2017-07-17 DIAGNOSIS — G8929 Other chronic pain: Secondary | ICD-10-CM | POA: Diagnosis present

## 2017-07-17 DIAGNOSIS — M79604 Pain in right leg: Secondary | ICD-10-CM

## 2017-07-17 DIAGNOSIS — M5116 Intervertebral disc disorders with radiculopathy, lumbar region: Secondary | ICD-10-CM | POA: Diagnosis not present

## 2017-07-17 DIAGNOSIS — Z7901 Long term (current) use of anticoagulants: Secondary | ICD-10-CM

## 2017-07-17 DIAGNOSIS — Z9889 Other specified postprocedural states: Secondary | ICD-10-CM | POA: Diagnosis not present

## 2017-07-17 DIAGNOSIS — M5416 Radiculopathy, lumbar region: Secondary | ICD-10-CM

## 2017-07-17 MED ORDER — ROPIVACAINE HCL 2 MG/ML IJ SOLN
2.0000 mL | Freq: Once | INTRAMUSCULAR | Status: AC
  Administered 2017-07-17: 2 mL via EPIDURAL

## 2017-07-17 MED ORDER — TRIAMCINOLONE ACETONIDE 40 MG/ML IJ SUSP
40.0000 mg | Freq: Once | INTRAMUSCULAR | Status: AC
  Administered 2017-07-17: 40 mg
  Filled 2017-07-17: qty 1

## 2017-07-17 MED ORDER — SODIUM CHLORIDE 0.9 % IJ SOLN
INTRAMUSCULAR | Status: AC
  Filled 2017-07-17: qty 10

## 2017-07-17 MED ORDER — LIDOCAINE HCL (PF) 2 % IJ SOLN
INTRAMUSCULAR | Status: AC
  Filled 2017-07-17: qty 10

## 2017-07-17 MED ORDER — IOPAMIDOL (ISOVUE-M 200) INJECTION 41%
10.0000 mL | Freq: Once | INTRAMUSCULAR | Status: AC
  Administered 2017-07-17: 10 mL via EPIDURAL
  Filled 2017-07-17: qty 10

## 2017-07-17 MED ORDER — SODIUM CHLORIDE 0.9% FLUSH
2.0000 mL | Freq: Once | INTRAVENOUS | Status: AC
  Administered 2017-07-17: 2 mL

## 2017-07-17 NOTE — Patient Instructions (Addendum)

## 2017-07-17 NOTE — Progress Notes (Signed)
Patient's Name: Alan Stafford  MRN: [id number]  Referring Provider: Lindwood Qua, MD  DOB: 04-11-1945  PCP: Lindwood Qua, MD  DOS: 07/17/2017  Note by: Oswaldo Done, MD  Service setting: Ambulatory outpatient  Specialty: Interventional Pain Management  Patient type: Established  Location: [facility] (AMB) Pain Management Facility  Visit type: Interventional Procedure   Primary Reason for Visit: Interventional Pain Management Treatment. CC: Back Pain (lower right is worse )  Procedure:       Anesthesia, Analgesia, Anxiolysis:  Type: Palliative Inter-Laminar Epidural Steroid Injection          Region: Lumbar Level: L5-S1 Level. Laterality: Right Paramedial  Type: Local Anesthesia Indication(s): Analgesia         Route: Infiltration ([place]/IM) IV Access: Declined Sedation: Declined  Local Anesthetic: Lidocaine 1-2%   Indications: 1. DDD (degenerative disc disease), lumbar   2. 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 Chronic lumbar radicular pain (Right)   4. Chronic lower extremity pain (Location of Primary Source of Pain) (Bilateral) (R>L)   5. Bulging lumbar disc (T12-L1, L1-2, L2-3, and L4-5)   6. Long term current use of anticoagulant therapy (Xarelto)    Pain Score: Pre-procedure: 4 /10 Post-procedure: 0-No pain/10  Pre-op Assessment:  Alan Stafford is a 72 y.o. (year old), male patient, seen today for interventional treatment. He  has a past surgical history that includes Knee surgery (Right, 2015); Eye surgery (Right, 2015); Joint replacement (Right); and Cataract extraction w/PHACO (Left, 09/26/2016). Alan Stafford has a current medication list which includes the following prescription(s): albuterol, aspirin ec, fifty50 glucose meter 2.0, budesonide-formoterol, enalapril, escitalopram, flecainide, glucose blood, glucose blood, glucose blood, metformin, methimazole, metoprolol succinate, nicotine, nitroglycerin, omeprazole, phenazopyridine, pregabalin, sildenafil, testosterone  cypionate, warfarin, and hydrocodone-acetaminophen. His primarily concern today is the Back Pain (lower right is worse )  Initial Vital Signs:  Pulse/HCG Rate: 76  Temp: 97.9 F (36.6 C) Resp: 16 BP: (!) 130/58 SpO2: 98 %  BMI: Estimated body mass index is 27.99 kg/m as calculated from the following:   Height as of this encounter:  (1.88 m).   Weight as of this encounter: 218 lb (98.9 kg).  Risk Assessment: Allergies: Reviewed. He is allergic to statins and sulfa antibiotics.  Allergy Precautions: None required Coagulopathies: Reviewed. None identified.  Blood-thinner therapy: None at this time Active Infection(s): Reviewed. None identified. Alan Stafford is afebrile  Site Confirmation: Alan Stafford was asked to confirm the procedure and laterality before marking the site Procedure checklist: Completed Consent: Before the procedure and under the influence of no sedative(s), amnesic(s), or anxiolytics, the patient was informed of the treatment options, risks and possible complications. To fulfill our ethical and legal obligations, as recommended by the American Medical Association's Code of Ethics, I have informed the patient of my clinical impression; the nature and purpose of the treatment or procedure; the risks, benefits, and possible complications of the intervention; the alternatives, including doing nothing; the risk(s) and benefit(s) of the alternative treatment(s) or procedure(s); and the risk(s) and benefit(s) of doing nothing. The patient was provided information about the general risks and possible complications associated with the procedure. These may include, but are not limited to: failure to achieve desired goals, infection, bleeding, organ or nerve damage, allergic reactions, paralysis, and death. In addition, the patient was informed of those risks and complications associated to Spine-related procedures, such as failure to decrease pain; infection (i.e.: Meningitis, epidural  or intraspinal abscess); bleeding (i.e.: epidural hematoma, subarachnoid hemorrhage, or  any other type of intraspinal or peri-dural bleeding); organ or nerve damage (i.e.: Any type of peripheral nerve, nerve root, or spinal cord injury) with subsequent damage to sensory, motor, and/or autonomic systems, resulting in permanent pain, numbness, and/or weakness of one or several areas of the body; allergic reactions; (i.e.: anaphylactic reaction); and/or death. Furthermore, the patient was informed of those risks and complications associated with the medications. These include, but are not limited to: allergic reactions (i.e.: anaphylactic or anaphylactoid reaction(s)); adrenal axis suppression; blood sugar elevation that in diabetics may result in ketoacidosis or comma; water retention that in patients with history of congestive heart failure may result in shortness of breath, pulmonary edema, and decompensation with resultant heart failure; weight gain; swelling or edema; medication-induced neural toxicity; particulate matter embolism and blood vessel occlusion with resultant organ, and/or nervous system infarction; and/or aseptic necrosis of one or more joints. Finally, the patient was informed that Medicine is not an exact science; therefore, there is also the possibility of unforeseen or unpredictable risks and/or possible complications that may result in a catastrophic outcome. The patient indicated having understood very clearly. We have given the patient no guarantees and we have made no promises. Enough time was given to the patient to ask questions, all of which were answered to the patient's satisfaction. Alan Stafford has indicated that he wanted to continue with the procedure. Attestation: I, the ordering provider, attest that I have discussed with the patient the benefits, risks, side-effects, alternatives, likelihood of achieving goals, and potential problems during recovery for the procedure that I have  provided informed consent. Date  Time: 07/17/2017  1:04 PM  Pre-Procedure Preparation:  Monitoring: As per clinic protocol. Respiration, ETCO2, SpO2, BP, heart rate and rhythm monitor placed and checked for adequate function Safety Precautions: Patient was assessed for positional comfort and pressure points before starting the procedure. Time-out: I initiated and conducted the "Time-out" before starting the procedure, as per protocol. The patient was asked to participate by confirming the accuracy of the "Time Out" information. Verification of the correct person, site, and procedure were performed and confirmed by me, the nursing staff, and the patient. "Time-out" conducted as per Joint Commission's Universal Protocol (UP.01.01.01). Time: 1354  Description of Procedure:       Position: Prone with head of the table was raised to facilitate breathing. Target Area: The interlaminar space, initially targeting the lower laminar border of the superior vertebral body. Approach: Paramedial approach. Area Prepped: Entire Posterior Lumbar Region Prepping solution: ChloraPrep (2% chlorhexidine gluconate and 70% isopropyl alcohol) Safety Precautions: Aspiration looking for blood return was conducted prior to all injections. At no point did we inject any substances, as a needle was being advanced. No attempts were made at seeking any paresthesias. Safe injection practices and needle disposal techniques used. Medications properly checked for expiration dates. SDV (single dose vial) medications used. Description of the Procedure: Protocol guidelines were followed. The procedure needle was introduced through the skin, ipsilateral to the reported pain, and advanced to the target area. Bone was contacted and the needle walked caudad, until the lamina was cleared. The epidural space was identified using "loss-of-resistance technique" with 2-3 ml of PF-NaCl (0.9% NSS), in a 5cc LOR glass syringe. Vitals:   07/17/17  1340 07/17/17 1345 07/17/17 1355 07/17/17 1406  BP: 125/74 125/73 125/75 122/73  Pulse: 65 65 63 70  Resp: Temp:      TempSrc:      SpO2: 95% 94% 96%  100%  Weight:      Height:        Start Time: 1354 hrs. End Time: 1357 hrs. Materials:  Needle(s) Type: Epidural needle Gauge: 17G Length: 3.5-in Medication(s): Please see orders for medications and dosing details.  Imaging Guidance (Spinal):  Type of Imaging Technique: Fluoroscopy Guidance (Spinal) Indication(s): Assistance in needle guidance and placement for procedures requiring needle placement in or near specific anatomical locations not easily accessible without such assistance. Exposure Time: Please see nurses notes. Contrast: Before injecting any contrast, we confirmed that the patient did not have an allergy to iodine, shellfish, or radiological contrast. Once satisfactory needle placement was completed at the desired level, radiological contrast was injected. Contrast injected under live fluoroscopy. No contrast complications. See chart for type and volume of contrast used. Fluoroscopic Guidance: I was personally present during the use of fluoroscopy. "Tunnel Vision Technique" used to obtain the best possible view of the target area. Parallax error corrected before commencing the procedure. "Direction-depth-direction" technique used to introduce the needle under continuous pulsed fluoroscopy. Once target was reached, antero-posterior, oblique, and lateral fluoroscopic projection used confirm needle placement in all planes. Images permanently stored in EMR. Interpretation: I personally interpreted the imaging intraoperatively. Adequate needle placement confirmed in multiple planes. Appropriate spread of contrast into desired area was observed. No evidence of afferent or efferent intravascular uptake. No intrathecal or subarachnoid spread observed. Permanent images saved into the patient's record.  Antibiotic Prophylaxis:    Anti-infectives (From admission, onward)   None     Indication(s): None identified  Post-operative Assessment:  Post-procedure Vital Signs:  Pulse/HCG Rate: 70  Temp: 97.9 F (36.6 C) Resp: 16 BP: 122/73 SpO2: 100 %  EBL: None  Complications: No immediate post-treatment complications observed by team, or reported by patient.  Note: The patient tolerated the entire procedure well. A repeat set of vitals were taken after the procedure and the patient was kept under observation following institutional policy, for this type of procedure. Post-procedural neurological assessment was performed, showing return to baseline, prior to discharge. The patient was provided with post-procedure discharge instructions, including a section on how to identify potential problems. Should any problems arise concerning this procedure, the patient was given instructions to immediately contact us, at any time, without hesitation. In any case, we plan to contact the patient by telephone for a follow-up status report regarding this interventional procedure.  Comments:  No additional relevant information.  Plan of Care    Imaging Orders     DG C-Arm 1-60 Min-No Report  Procedure Orders     Lumbar Epidural Injection     Lumbar Epidural Injection  Medications ordered for procedure: Meds ordered this encounter  Medications  . iopamidol (ISOVUE-M) 41 % intrathecal injection 10 mL    Must be Myelogram-compatible. If not available, you may substitute with a water-soluble, non-ionic, hypoallergenic, myelogram-compatible radiological contrast medium.  . sodium chloride flush (NS) 0.9 % injection 2 mL  . ropivacaine (PF) 2 mg/mL (0.2%) (NAROPIN) injection 2 mL  . triamcinolone acetonide (KENALOG-40) injection 40 mg   Medications administered: We administered iopamidol, sodium chloride flush, ropivacaine (PF) 2 mg/mL (0.2%), and triamcinolone acetonide.  See the medical record for exact dosing, route, and  time of administration.  New Prescriptions   No medications on file   Disposition: Discharge home  Discharge Date & Time: 07/17/2017; 1408 hrs.   Physician-requested Follow-up: Return for post-procedure eval (2 wks), w/ Dr. Laban Emperor.  Future Appointments  Date Time Provider Department Center  08/13/2017 11:15 AM Delano Metz, MD [facility] None   Primary Care Physician: Lindwood Qua, MD Location: [facility] Note by: Oswaldo Done, MD Date: 07/17/2017; Time: 2:58 PM  Disclaimer:  Medicine is not an Visual merchandiser. The only guarantee in medicine is that nothing is guaranteed. It is important to note that the decision to proceed with this intervention was based on the information collected from the patient. The Data and conclusions were drawn from the patient's questionnaire, the interview, and the physical examination. Because the information was provided in large part by the patient, it cannot be guaranteed that it has not been purposely or unconsciously manipulated. Every effort has been made to obtain as much relevant data as possible for this evaluation. It is important to note that the conclusions that lead to this procedure are derived in large part from the available data. Always take into account that the treatment will also be dependent on availability of resources and existing treatment guidelines, considered by other Pain Management Practitioners as being common knowledge and practice, at the time of the intervention. For Medico-Legal purposes, it is also important to point out that variation in procedural techniques and pharmacological choices are the acceptable norm. The indications, contraindications, technique, and results of the above procedure should only be interpreted and judged by a Board-Certified Interventional Pain Specialist with extensive familiarity and expertise in the same exact procedure and technique.

## 2017-07-18 ENCOUNTER — Telehealth: Payer: Self-pay

## 2017-07-18 NOTE — Telephone Encounter (Signed)
Post procedure phone call. Patient states he is doing well.  

## 2017-08-12 NOTE — Progress Notes (Signed)
Patient's Name: Alan Stafford  MRN: [id number]  Referring Provider: Raelene Bott, MD  DOB: [birth year]  PCP: Raelene Bott, MD  DOS: 08/13/2017  Note by: Gaspar Cola, MD  Service setting: Ambulatory outpatient  Specialty: Interventional Pain Management  Location: [facility] (AMB) Pain Management Facility    Patient type: Established   Primary Reason(s) for Visit: Encounter for post-procedure evaluation of chronic illness with mild to moderate exacerbation CC: Back Pain  HPI  [name] is a 72 y.o. year old, male patient, who comes today for a post-procedure evaluation. He has Lumbar spondylosis; Airway hyperreactivity; Atrial fibrillation ([place]); Chronic kidney disease (CKD), stage III (moderate) (HCC); CAD in native artery; Essential hypertension; Acid reflux; Cardiac murmur; Hypercholesterolemia; Hypertriglyceridemia; Hyperthyroidism; Hypotestosteronism; Angina pectoris ([place]); Paroxysmal atrial fibrillation ([place]); Encounter for screening for other disorder; Drug intolerance; Type 2 diabetes mellitus with vascular disease ([place]); Long term current use of anticoagulant therapy (Coumadin); Bulging lumbar disc (T12-L1, L1-2, L2-3, and L4-5); Lumbar foraminal stenosis (severe right L4-5); Chronic lower extremity pain (Primary Source of Pain) (Bilateral) (R>L); Chronic hip pain (Right); Chronic lumbar radicular pain (Right); Myofascial pain syndrome (right suprascapular muscle); Chronic neck pain (Bilateral) (R>L); Chronic shoulder pain (Right); Cervical spondylosis; Chronic cervical radicular pain (Right); Chronic knee pain [facility] source of pain) (Left); Long term current use of opiate analgesic; Long term prescription opiate use; Opiate use; Compression fracture of thoracic vertebra (HCC) (T3, T4, and T8); Cervical facet hypertrophy (Bilateral C3-4); Encounter for therapeutic drug level monitoring; Encounter for chronic pain management; Cervical nerve root disorder; Closed wedge fracture of thoracic  vertebra, sequela (T3, T4, and T8); Cervical facet syndrome (Bilateral) (R>L); Osteoarthritis of knee (Left); Osteoarthritis of hip (Left); Chronic systolic heart failure ([place]); Opioid dependence ([place]); Chronic pain syndrome; Diabetic peripheral neuropathy ([place]); Controlled substance agreement signed; Acute on chronic systolic congestive heart failure ([place]); DDD (degenerative disc disease), lumbar; Benign non-nodular prostatic hyperplasia with lower urinary tract symptoms; Low testosterone; Asthma; Chronic combined systolic and diastolic congestive heart failure (HCC); and Chronic low back pain (Secondary source of pain) (Bilateral) (R>L) on their problem list. His primarily concern today is the Back Pain  Pain Assessment: Location: Lower Back Radiating: Radiates down to right hip and leg Onset: More than a month ago Duration: Chronic pain Quality: Discomfort, Aching Severity: 2 /10 (subjective, self-reported pain score)  Note: Reported level is compatible with observation.                               Timing: Constant Modifying factors: medications and procedure BP: 140/70  HR: 61  [name] comes in today for post-procedure evaluation after the treatment done on 07/18/2017.  Further details on both, my assessment(s), as well as the proposed treatment plan, please see below.  Post-Procedure Assessment  07/17/2017 Procedure: Palliative right-sided L5-S1 interlaminar LESI under fluoroscopic guidance, no sedation Pre-procedure pain score:  4/10 Post-procedure pain score: 0/10 (100% relief) Influential Factors: BMI: 28.25 kg/m Intra-procedural challenges: None observed.         Assessment challenges: None detected.              Reported side-effects: None.        Post-procedural adverse reactions or complications: None reported         Sedation: No sedation used. When no sedatives are used, the analgesic levels obtained are directly associated to the effectiveness of the local anesthetics.  However, when sedation is provided, the level of analgesia  obtained during the initial 1 hour following the intervention, is believed to be the result of a combination of factors. These factors may include, but are not limited to: 1. The effectiveness of the local anesthetics used. 2. The effects of the analgesic(s) and/or anxiolytic(s) used. 3. The degree of discomfort experienced by the patient at the time of the procedure. 4. The patients ability and reliability in recalling and recording the events. 5. The presence and influence of possible secondary gains and/or psychosocial factors. Reported result: Relief experienced during the 1st hour after the procedure: 100 % (Ultra-Short Term Relief)            Interpretative annotation: Clinically appropriate result. No IV Analgesic or Anxiolytic given, therefore benefits are completely due to Local Anesthetic effects.          Effects of local anesthetic: The analgesic effects attained during this period are directly associated to the localized infiltration of local anesthetics and therefore cary significant diagnostic value as to the etiological location, or anatomical origin, of the pain. Expected duration of relief is directly dependent on the pharmacodynamics of the local anesthetic used. Long-acting (4-6 hours) anesthetics used.  Reported result: Relief during the next 4 to 6 hour after the procedure: 90 % (Short-Term Relief)            Interpretative annotation: Clinically appropriate result. Analgesia during this period is likely to be Local Anesthetic-related.          Long-term benefit: Defined as the period of time past the expected duration of local anesthetics (1 hour for short-acting and 4-6 hours for long-acting). With the possible exception of prolonged sympathetic blockade from the local anesthetics, benefits during this period are typically attributed to, or associated with, other factors such as analgesic sensory neuropraxia,  antiinflammatory effects, or beneficial biochemical changes provided by agents other than the local anesthetics.  Reported result: Extended relief following procedure: 80 % (Long-Term Relief)            Interpretative annotation: Clinically appropriate result. Good relief. Therapeutic success. Inflammation plays a part in the etiology to the pain.          Current benefits: Defined as reported results that persistent at this point in time.   Analgesia: 75-100 % Mr. Schwenke reports that both, extremity and the axial pain improved with the treatment. Function: Mr. Micale reports improvement in function ROM: Mr. Soward reports improvement in ROM Interpretative annotation: Ongoing benefit. Therapeutic benefit observed. Effective therapeutic approach.          Interpretation: Results would suggest a successful palliative intervention.                  Plan:  Set up procedure as a PRN palliative treatment option for this patient.                Laboratory Chemistry  Inflammation Markers (CRP: Acute Phase) (ESR: Chronic Phase) Lab Results  Component Value Date   ESRSEDRATE 5 05/20/2016   LATICACIDVEN 1.4 04/22/2016                         Renal Markers Lab Results  Component Value Date   BUN 48 (H) 05/23/2016   CREATININE 1.83 (H) 05/23/2016   GFRAA 41 (L) 05/23/2016   GFRNONAA 36 (L) 05/23/2016  Hepatic Markers Lab Results  Component Value Date   AST 53 (H) 05/18/2016   ALT 37 05/18/2016   ALBUMIN 3.3 (L) 05/18/2016                        Neuropathy Markers Lab Results  Component Value Date   HGBA1C 6.2 (H) 04/23/2016                        Hematology Parameters Lab Results  Component Value Date   INR 2.01 05/22/2016   LABPROT 23.1 (H) 05/22/2016   APTT 46 (H) 04/22/2016   PLT 222 05/19/2016   HGB 11.7 (L) 05/19/2016   HCT 36.2 (L) 05/19/2016                        CV Markers Lab Results  Component Value Date   BNP 418.0 (H) 05/20/2016    TROPONINI <0.03 05/18/2016                         Note: Lab results reviewed.  Recent Diagnostic Imaging Results  DG C-Arm 1-60 Min-No Report Fluoroscopy was utilized by the requesting physician.  No radiographic  interpretation.   Complexity Note: I personally reviewed the fluoroscopic imaging of the procedure.                        Meds   Current Outpatient Medications:  .  albuterol (PROVENTIL HFA;VENTOLIN HFA) 108 (90 Base) MCG/ACT inhaler, Inhale 1 puff into the lungs every 6 (six) hours as needed for wheezing or shortness of breath. , Disp: , Rfl:  .  aspirin EC 81 MG tablet, Take 81 mg by mouth., Disp: , Rfl:  .  Blood Glucose Monitoring Suppl (FIFTY50 GLUCOSE METER 2.0) w/Device KIT, 1 each by Other route once. Use as instructed to check fasting BS daily.   Glucometer ; One-Touch, Disp: , Rfl:  .  budesonide-formoterol (SYMBICORT) 160-4.5 MCG/ACT inhaler, Inhale 2 puffs into the lungs as needed., Disp: , Rfl:  .  enalapril (VASOTEC) 5 MG tablet, Take 5 mg by mouth daily., Disp: , Rfl:  .  escitalopram (LEXAPRO) 20 MG tablet, Take 20 mg by mouth 2 (two) times daily. , Disp: , Rfl:  .  flecainide (TAMBOCOR) 100 MG tablet, Take 100 mg by mouth 2 (two) times daily., Disp: , Rfl:  .  glucose blood (ACCU-CHEK COMFORT CURVE) test strip, , Disp: , Rfl:  .  glucose blood (ONE TOUCH ULTRA TEST) test strip, USE AS DIRECTED, Disp: , Rfl:  .  glucose blood (ONE TOUCH ULTRA TEST) test strip, USE AS DIRECTED, Disp: , Rfl:  .  HYDROcodone-acetaminophen (NORCO) 10-325 MG tablet, Take 1 tablet by mouth 3 (three) times daily as needed., Disp: , Rfl:  .  metFORMIN (GLUCOPHAGE) 500 MG tablet, Take 500 mg by mouth 2 (two) times daily with a meal. , Disp: , Rfl:  .  methimazole (TAPAZOLE) 10 MG tablet, Take 10 mg by mouth 2 (two) times daily. , Disp: , Rfl:  .  metoprolol succinate (TOPROL-XL) 25 MG 24 hr tablet, Take 0.5 tablets (12.5 mg total) by mouth daily. (Patient taking differently: Take 25 mg  by mouth daily. ), Disp: 30 tablet, Rfl: 0 .  nicotine (NICODERM CQ - DOSED IN MG/24 HOURS) 14 mg/24hr patch, Place 1 patch (14 mg total) onto the skin daily., Disp:  28 patch, Rfl: 0 .  nitroGLYCERIN (NITROSTAT) 0.4 MG SL tablet, Place 0.4 mg under the tongue every 5 (five) minutes as needed for chest pain. , Disp: , Rfl:  .  omeprazole (PRILOSEC) 20 MG capsule, Take 20 mg by mouth daily. , Disp: , Rfl:  .  phenazopyridine (PYRIDIUM) 200 MG tablet, Take 1 tablet by mouth 3 (three) times daily., Disp: , Rfl:  .  pregabalin (LYRICA) 100 MG capsule, Take 100 mg by mouth 2 (two) times daily., Disp: , Rfl:  .  sildenafil (REVATIO) 20 MG tablet, 2-5 daily as needed. Do not take nitroglycerin afterward, Disp: , Rfl:  .  testosterone cypionate (DEPOTESTOSTERONE CYPIONATE) 200 MG/ML injection, Inject 400 mg into the muscle every 21 ( twenty-one) days. , Disp: , Rfl:  .  warfarin (COUMADIN) 5 MG tablet, Take 5 mg by mouth., Disp: , Rfl:   ROS  Constitutional: Denies any fever or chills Gastrointestinal: No reported hemesis, hematochezia, vomiting, or acute GI distress Musculoskeletal: Denies any acute onset joint swelling, redness, loss of ROM, or weakness Neurological: No reported episodes of acute onset apraxia, aphasia, dysarthria, agnosia, amnesia, paralysis, loss of coordination, or loss of consciousness  Allergies  Mr. Viney is allergic to statins and sulfa antibiotics.  [place]  Drug: Mr. Szabo  reports that he does not use drugs. Alcohol:  reports that he does not drink alcohol. Tobacco:  reports that he has never smoked. He uses smokeless tobacco. Medical:  has a past medical history of A-fib ([place]), Abnormal heart rhythm, Arthritis, Asthma, CAP (community acquired pneumonia) (04/22/2016), Diabetes mellitus without complication ([place]), Dysrhythmia, GERD (gastroesophageal reflux disease), Hemoptysis (04/22/2016), Hypercholesteremia, Hypertension, Hyperthyroidism, Kidney stones, and Sepsis ([place])  (04/22/2016). Surgical: Mr. Hartog  has a past surgical history that includes Knee surgery (Right, 2015); Eye surgery (Right, 2015); Joint replacement (Right); and Cataract extraction w/PHACO (Left, 09/26/2016). Family: family history includes Dementia in his mother; Heart disease in his father.  Constitutional Exam  General appearance: Well nourished, well developed, and well hydrated. In no apparent acute distress Vitals:   08/13/17 1153  BP: 140/70  Pulse: 61  Temp: 98.1 F (36.7 C)  SpO2: 99%  Weight: 220 lb (99.8 kg)  Height: 6' 2"  (1.88 m)   BMI Assessment: Estimated body mass index is 28.25 kg/m as calculated from the following:   Height as of this encounter: 6' 2"  (1.88 m).   Weight as of this encounter: 220 lb (99.8 kg).  BMI interpretation table: BMI level Category Range association with higher incidence of chronic pain  <18 kg/m2 Underweight   18.5-24.9 kg/m2 Ideal body weight   25-29.9 kg/m2 Overweight Increased incidence by 20%  30-34.9 kg/m2 Obese (Class I) Increased incidence by 68%  35-39.9 kg/m2 Severe obesity (Class II) Increased incidence by 136%  >40 kg/m2 Extreme obesity (Class III) Increased incidence by 254%   Patient's current BMI Ideal Body weight  Body mass index is 28.25 kg/m. Ideal body weight: 82.2 kg (181 lb 3.5 oz) Adjusted ideal body weight: 89.2 kg (196 lb 11.7 oz)   BMI Readings from Last 4 Encounters:  08/13/17 28.25 kg/m  07/17/17 27.99 kg/m  10/17/16 27.99 kg/m  09/26/16 28.37 kg/m   Wt Readings from Last 4 Encounters:  08/13/17 220 lb (99.8 kg)  07/17/17 218 lb (98.9 kg)  10/17/16 218 lb (98.9 kg)  09/26/16 218 lb (98.9 kg)  Psych/Mental status: Alert, oriented x 3 (person, place, & time)       Eyes: PERLA Respiratory: No evidence  of acute respiratory distress  Cervical Spine Area Exam  Skin & Axial Inspection: No masses, redness, edema, swelling, or associated skin lesions Alignment: Symmetrical Functional ROM: Unrestricted  ROM      Stability: No instability detected Muscle Tone/Strength: Functionally intact. No obvious neuro-muscular anomalies detected. Sensory (Neurological): Unimpaired Palpation: No palpable anomalies              Upper Extremity (UE) Exam    Side: Right upper extremity  Side: Left upper extremity  Skin & Extremity Inspection: Skin color, temperature, and hair growth are WNL. No peripheral edema or cyanosis. No masses, redness, swelling, asymmetry, or associated skin lesions. No contractures.  Skin & Extremity Inspection: Skin color, temperature, and hair growth are WNL. No peripheral edema or cyanosis. No masses, redness, swelling, asymmetry, or associated skin lesions. No contractures.  Functional ROM: Unrestricted ROM          Functional ROM: Unrestricted ROM          Muscle Tone/Strength: Functionally intact. No obvious neuro-muscular anomalies detected.  Muscle Tone/Strength: Functionally intact. No obvious neuro-muscular anomalies detected.  Sensory (Neurological): Unimpaired          Sensory (Neurological): Unimpaired          Palpation: No palpable anomalies              Palpation: No palpable anomalies              Provocative Test(s):  Phalen's test: deferred Tinel's test: deferred Apley's scratch test (touch opposite shoulder):  Action 1 (Across chest): deferred Action 2 (Overhead): deferred Action 3 (LB reach): deferred   Provocative Test(s):  Phalen's test: deferred Tinel's test: deferred Apley's scratch test (touch opposite shoulder):  Action 1 (Across chest): deferred Action 2 (Overhead): deferred Action 3 (LB reach): deferred    Thoracic Spine Area Exam  Skin & Axial Inspection: No masses, redness, or swelling Alignment: Symmetrical Functional ROM: Unrestricted ROM Stability: No instability detected Muscle Tone/Strength: Functionally intact. No obvious neuro-muscular anomalies detected. Sensory (Neurological): Unimpaired Muscle strength & Tone: No palpable  anomalies  Lumbar Spine Area Exam  Skin & Axial Inspection: No masses, redness, or swelling Alignment: Symmetrical Functional ROM: Unrestricted ROM       Stability: No instability detected Muscle Tone/Strength: Functionally intact. No obvious neuro-muscular anomalies detected. Sensory (Neurological): Unimpaired Palpation: No palpable anomalies       Provocative Tests: Lumbar Hyperextension/rotation test: deferred today       Lumbar quadrant test (Kemp's test): deferred today       Lumbar Lateral bending test: deferred today       Patrick's Maneuver: deferred today                   FABER test: deferred today       Thigh-thrust test: deferred today       S-I compression test: deferred today       S-I distraction test: deferred today        Gait & Posture Assessment  Ambulation: Unassisted Gait: Relatively normal for age and body habitus Posture: WNL   Lower Extremity Exam    Side: Right lower extremity  Side: Left lower extremity  Stability: No instability observed          Stability: No instability observed          Skin & Extremity Inspection: Skin color, temperature, and hair growth are WNL. No peripheral edema or cyanosis. No masses, redness, swelling, asymmetry, or associated skin lesions. No  contractures.  Skin & Extremity Inspection: Skin color, temperature, and hair growth are WNL. No peripheral edema or cyanosis. No masses, redness, swelling, asymmetry, or associated skin lesions. No contractures.  Functional ROM: Unrestricted ROM                  Functional ROM: Unrestricted ROM                  Muscle Tone/Strength: Functionally intact. No obvious neuro-muscular anomalies detected.  Muscle Tone/Strength: Functionally intact. No obvious neuro-muscular anomalies detected.  Sensory (Neurological): Unimpaired  Sensory (Neurological): Unimpaired  Palpation: No palpable anomalies  Palpation: No palpable anomalies   Assessment  Primary Diagnosis & Pertinent Problem List: The  primary encounter diagnosis was Chronic lower extremity pain (Primary Source of Pain) (Bilateral) (R>L). Diagnoses of Lumbar foraminal stenosis (severe right L4-5), Chronic lumbar radicular pain (Right), Chronic low back pain (Secondary source of pain) (Bilateral) (R>L), Closed wedge fracture of thoracic vertebra, sequela (T3, T4, and T8), Chronic knee pain (Tertiary source of pain) (Left), and Long term current use of anticoagulant therapy (Coumadin) were also pertinent to this visit.  Status Diagnosis  Improved Stable Resolved 1. Chronic lower extremity pain (Primary Source of Pain) (Bilateral) (R>L)   2. 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 Chronic lumbar radicular pain (Right)   4. Chronic low back pain (Secondary source of pain) (Bilateral) (R>L)   5. Closed wedge fracture of thoracic vertebra, sequela (T3, T4, and T8)   6. Chronic knee pain [facility] source of pain) (Left)   7. Long term current use of anticoagulant therapy (Coumadin)     Problems updated and reviewed during this visit: Problem  Chronic low back pain (Secondary source of pain) (Bilateral) (R>L)  Chronic lower extremity pain (Primary Source of Pain) (Bilateral) (R>L)  Chronic knee pain (Tertiary source of pain) (Left)  Ddd (Degenerative Disc Disease), Lumbar   Prior Authorization Request: Current Medication:Hydrocodone 10-325 Telephone Encounter Date:03/10/2017 approved for quantity of 7 pills per day  Through 02/24/2018   Long term current use of anticoagulant therapy (Coumadin)  Hyperthyroidism   Last Assessment & Plan:  Currently uncontrolled as evidenced by TSH = 5.25 above goal of 0.6-4.0 and based on presence of symptoms. Reflective of treatment with methimazole.  Of note, patient taking differently than prescribed but is a poor historian.  Recommend dose adjustment of methimazole though patient may be taking incorrectly.  Recommend repeat TSH at next visit to determine dose of methimazole  needed.   Essential Hypertension   Last Assessment & Plan:  Currently controlled per Patient reported blood pressure of 125-130/65-70, below goal blood pressure of 130/80 per 2017 ACC/AHA Focused Update for HF. Reflective of treatment with enalapril 10 mg and metoprolol 25 mg daily (of note, this is patient report and it is higher doses than prescribed).  Continue current regimen  Discussed home monitoring of blood pressure   Counseled on monitoring for signs of hypertension/hypotension   Hypercholesterolemia   Last Assessment & Plan:  Clinical ASCVD + age </= 33 indicates need for high intensity statin therapy per ACC/AHA 2018 Cholesterol Guidelines. Reflective of treatment with no treatment given history of statin intolerance. Goal of therapy to control cholesterol and prevent CV events.  Recommend additional therapy with zetia 10 mg daily given statin intolerance.     Recommended physical activity and diet low in saturated & trans fats.   Paroxysmal Atrial Fibrillation (Hcc)   Last Assessment & Plan:  Currently controlled as evidenced  by HR=60 and absence of symptoms.  CHADS2-VASc= 4 (CHF, HTN, DM and 45-74 y.o.) indicates need for anticoagulation therapy for stroke prevention. Reflective of treatment with metoprolol and flecainide for rate and rhythm control and warfarin (INR goal 2.0-3.0) for anticoagulation. Goals of therapy are to maintain HR between 60-80 bpm, prevent occurrence of atrial fibrillation, and prevent stroke.  Continue current regimen  Discussed home monitoring of BP and HR  Counseled regarding management of HTN, management of DM and management of HLD   Type 2 Diabetes Mellitus With Vascular Disease (Hcc)   Last Assessment & Plan:  Currently controlled with A1c 6.4% in relation to goal A1c <7% per ADA guidelines.  Reflective of treatment with metformin 500 mg TID (of note he is taking 1000 every AM and 500 every PM d/t missing mid day dose in the past).     Continue current regimen  Reviewed signs and symptoms of hypoglycemia and appropriate treatment when blood glucose is <70.  Recommend routine blood glucose monitoring   Counseled regarding lifestyle modifications including 150 minutes of exercise per week and low carb diet.     Due for vaccines (shingrix).  Recommend following up with Dr. Heber [place]   Chronic Combined Systolic and Diastolic Congestive Heart Failure (Hcc)   Echocardiogram 04/23/16- EF 40-45% NM cardiac stress test 07/16/2016- No ischemia with normal LV function and EF 51%  Last Assessment & Plan:  Currently controlled based on presence of symptoms. Reflective of treatment with enalpril and metoprolol.  Goals of therapy are to reduce HF exacerbations and reduce symptoms of HF.    Continue current regimen  Instructed pt to notify provider of weight gain > 3 lbs in 1 day or 5 lbs in 1 week  Nonpharmacologic therapy discussed: dietary sodium restriction, fluid restriction and encouraged daily monitoring of the patient's weight   Controlled Substance Agreement Signed   Updated controlled substance agreement  04/16/2017   Benign Non-Nodular Prostatic Hyperplasia With Lower Urinary Tract Symptoms   Last Assessment & Plan:  Currently controlled based on absence of symptoms. Reflective of no current treatment. Goals of therapy are to reduce symptoms of BPH.   Continue current regimen of watchful waiting  Counseled patient on limiting fluid intake before bedtime and avoiding caffeine and alcohol.   Low Testosterone   Prior Authorization Request: Current Medication:testosterone cypionate Telephone Encounter Date: 10/13/2015 approved  10/12/2015 to 10/11/2016   Last Assessment & Plan:  Currently uncontrolled as evidenced by lab value of 109 and below goal lab value 400-700 per guidelines. Reflective of treatment with testosterone 400 mg injections every 2 weeks. Goal of therapy increase testosterone to goal levels.  Of note,  patient has had trouble affording in the past, but believes with new insurance price will be lower.  Recommend rechecking testosterone lab as last checked in 2017 and adjusting dose as needed.   Asthma   Prior Authorization Request: Current Medication:Symbicort 106 Telephone Encounter Date: 01/15/2017 approved  [employer]ru [employer]  Prior Authorization Request: Current Medication:symbicort 160  Telephone Encounter Date:01/25/2016  Approved 01/23/2016 through 01/22/2017  Last Assessment & Plan:  Currently controlled with mild severity per the [place] [facility] guidelines based on based on absence symptoms and absence of rescue inhaler use or recent exacerbation. Reflective of treatment with symbicort and albuterol.  Of note patient reports he cannot afford symbicort inhaler. Goal of therapy to reduce exacerbations and symptoms  recommend changing inhaler to budesonide and arformoterol nebs which should be covered 80% by Medicare  Part B  Reviewed indication and technique for each inhaler  Recommended nonpharmalogic measures, including: smoking cessation and vaccines (pneumococcal and influenza)    Plan of Care  Pharmacotherapy (Medications Ordered): No orders of the defined types were placed in this encounter.  Medications administered today: Gedeon Brandow. Halter had no medications administered during this visit.  Procedure Orders     Lumbar Transforaminal Epidural Lab Orders  No laboratory test(s) ordered today   Imaging Orders  No imaging studies ordered today   Referral Orders  No referral(s) requested today    Interventional management options: Planned, scheduled, and/or pending:   Palliative right-sided L5-S1 interlaminar LESI under fluoroscopic guidance, no sedation   Considering:   Palliative right-sided L4-5 transforaminal ESI    Palliative PRN treatment(s):   Palliative right-sided L4-5 transforaminal ESI  Palliative right-sided L5-S1  interlaminar LESI under fluoroscopic guidance, no sedation   Provider-requested follow-up: Return for PRN Procedure: (R) L5-S1 LESI (No sedation).  No future appointments. Primary Care Physician: Raelene Bott, MD Location: [facility] Note by: Gaspar Cola, MD Date: 08/13/2017; Time: 12:34 PM

## 2017-08-13 ENCOUNTER — Other Ambulatory Visit: Payer: Self-pay

## 2017-08-13 ENCOUNTER — Ambulatory Visit: Payer: Medicare Other | Attending: Pain Medicine | Admitting: Pain Medicine

## 2017-08-13 ENCOUNTER — Encounter: Payer: Self-pay | Admitting: Pain Medicine

## 2017-08-13 VITALS — BP 140/70 | HR 61 | Temp 98.1°F | Ht 74.0 in | Wt 220.0 lb

## 2017-08-13 DIAGNOSIS — I5042 Chronic combined systolic (congestive) and diastolic (congestive) heart failure: Secondary | ICD-10-CM | POA: Diagnosis not present

## 2017-08-13 DIAGNOSIS — K219 Gastro-esophageal reflux disease without esophagitis: Secondary | ICD-10-CM | POA: Diagnosis not present

## 2017-08-13 DIAGNOSIS — E781 Pure hyperglyceridemia: Secondary | ICD-10-CM | POA: Insufficient documentation

## 2017-08-13 DIAGNOSIS — N183 Chronic kidney disease, stage 3 (moderate): Secondary | ICD-10-CM | POA: Diagnosis not present

## 2017-08-13 DIAGNOSIS — I480 Paroxysmal atrial fibrillation: Secondary | ICD-10-CM | POA: Diagnosis not present

## 2017-08-13 DIAGNOSIS — I25119 Atherosclerotic heart disease of native coronary artery with unspecified angina pectoris: Secondary | ICD-10-CM | POA: Insufficient documentation

## 2017-08-13 DIAGNOSIS — S22000S Wedge compression fracture of unspecified thoracic vertebra, sequela: Secondary | ICD-10-CM | POA: Diagnosis not present

## 2017-08-13 DIAGNOSIS — Z7901 Long term (current) use of anticoagulants: Secondary | ICD-10-CM | POA: Diagnosis not present

## 2017-08-13 DIAGNOSIS — F1120 Opioid dependence, uncomplicated: Secondary | ICD-10-CM | POA: Diagnosis not present

## 2017-08-13 DIAGNOSIS — M5441 Lumbago with sciatica, right side: Secondary | ICD-10-CM

## 2017-08-13 DIAGNOSIS — M25562 Pain in left knee: Secondary | ICD-10-CM

## 2017-08-13 DIAGNOSIS — Z7982 Long term (current) use of aspirin: Secondary | ICD-10-CM | POA: Diagnosis not present

## 2017-08-13 DIAGNOSIS — E1122 Type 2 diabetes mellitus with diabetic chronic kidney disease: Secondary | ICD-10-CM | POA: Insufficient documentation

## 2017-08-13 DIAGNOSIS — M9983 Other biomechanical lesions of lumbar region: Secondary | ICD-10-CM

## 2017-08-13 DIAGNOSIS — M1610 Unilateral primary osteoarthritis, unspecified hip: Secondary | ICD-10-CM | POA: Diagnosis not present

## 2017-08-13 DIAGNOSIS — M5116 Intervertebral disc disorders with radiculopathy, lumbar region: Secondary | ICD-10-CM | POA: Insufficient documentation

## 2017-08-13 DIAGNOSIS — M79604 Pain in right leg: Secondary | ICD-10-CM | POA: Diagnosis not present

## 2017-08-13 DIAGNOSIS — Z9889 Other specified postprocedural states: Secondary | ICD-10-CM | POA: Insufficient documentation

## 2017-08-13 DIAGNOSIS — M48061 Spinal stenosis, lumbar region without neurogenic claudication: Secondary | ICD-10-CM

## 2017-08-13 DIAGNOSIS — Z87442 Personal history of urinary calculi: Secondary | ICD-10-CM | POA: Diagnosis not present

## 2017-08-13 DIAGNOSIS — Z7984 Long term (current) use of oral hypoglycemic drugs: Secondary | ICD-10-CM | POA: Insufficient documentation

## 2017-08-13 DIAGNOSIS — G8929 Other chronic pain: Secondary | ICD-10-CM

## 2017-08-13 DIAGNOSIS — M5442 Lumbago with sciatica, left side: Secondary | ICD-10-CM

## 2017-08-13 DIAGNOSIS — Z79899 Other long term (current) drug therapy: Secondary | ICD-10-CM | POA: Diagnosis not present

## 2017-08-13 DIAGNOSIS — M549 Dorsalgia, unspecified: Secondary | ICD-10-CM | POA: Insufficient documentation

## 2017-08-13 DIAGNOSIS — Z9660 Presence of unspecified orthopedic joint implant: Secondary | ICD-10-CM | POA: Insufficient documentation

## 2017-08-13 DIAGNOSIS — M5416 Radiculopathy, lumbar region: Secondary | ICD-10-CM | POA: Diagnosis not present

## 2017-08-13 DIAGNOSIS — M7918 Myalgia, other site: Secondary | ICD-10-CM | POA: Insufficient documentation

## 2017-08-13 DIAGNOSIS — Z9841 Cataract extraction status, right eye: Secondary | ICD-10-CM | POA: Insufficient documentation

## 2017-08-13 DIAGNOSIS — G894 Chronic pain syndrome: Secondary | ICD-10-CM | POA: Insufficient documentation

## 2017-08-13 DIAGNOSIS — E7800 Pure hypercholesterolemia, unspecified: Secondary | ICD-10-CM | POA: Insufficient documentation

## 2017-08-13 DIAGNOSIS — I130 Hypertensive heart and chronic kidney disease with heart failure and stage 1 through stage 4 chronic kidney disease, or unspecified chronic kidney disease: Secondary | ICD-10-CM | POA: Diagnosis not present

## 2017-08-13 DIAGNOSIS — N401 Enlarged prostate with lower urinary tract symptoms: Secondary | ICD-10-CM | POA: Insufficient documentation

## 2017-08-13 DIAGNOSIS — J45909 Unspecified asthma, uncomplicated: Secondary | ICD-10-CM | POA: Diagnosis not present

## 2017-08-13 NOTE — Patient Instructions (Signed)
____________________________________________________________________________________________  Preparing for your procedure (without sedation)  Instructions: . Oral Intake: Do not eat or drink anything for at least 3 hours prior to your procedure. . Transportation: Unless otherwise stated by your physician, you may drive yourself after the procedure. . Blood Pressure Medicine: Take your blood pressure medicine with a sip of water the morning of the procedure. . Blood thinners:  . Diabetics on insulin: Notify the staff so that you can be scheduled 1st case in the morning. If your diabetes requires high dose insulin, take only  of your normal insulin dose the morning of the procedure and notify the staff that you have done so. . Preventing infections: Shower with an antibacterial soap the morning of your procedure.  . Build-up your immune system: Take 1000 mg of Vitamin C with every meal (3 times a day) the day prior to your procedure. . Antibiotics: Inform the staff if you have a condition or reason that requires you to take antibiotics before dental procedures. . Pregnancy: If you are pregnant, call and cancel the procedure. . Sickness: If you have a cold, fever, or any active infections, call and cancel the procedure. . Arrival: You must be in the facility at least 30 minutes prior to your scheduled procedure. . Children: Do not bring any children with you. . Dress appropriately: Bring dark clothing that you would not mind if they get stained. . Valuables: Do not bring any jewelry or valuables.  Procedure appointments are reserved for interventional treatments only. . No Prescription Refills. . No medication changes will be discussed during procedure appointments. . No disability issues will be discussed.  Remember:  Regular Business hours are:  Monday to Thursday 8:00 AM to 4:00 PM  Provider's Schedule: Zeniyah Peaster, MD:  Procedure days: Tuesday and Thursday 7:30 AM to 4:00  PM  Bilal Lateef, MD:  Procedure days: Monday and Wednesday 7:30 AM to 4:00 PM ____________________________________________________________________________________________    

## 2018-05-05 DIAGNOSIS — I6523 Occlusion and stenosis of bilateral carotid arteries: Secondary | ICD-10-CM | POA: Insufficient documentation

## 2018-08-01 IMAGING — CR DG CHEST 2V
1 series · 2 of 2 positions shown · non-contrast
Comparison: May 21, 2016 and April 22, 2016

CLINICAL DATA: Recent pneumonia

EXAM:
CHEST  2 VIEW

[Series 1: dg chest 2 view · 0.14mm/px · 2 of 2 slices shown]
[im 1/2]
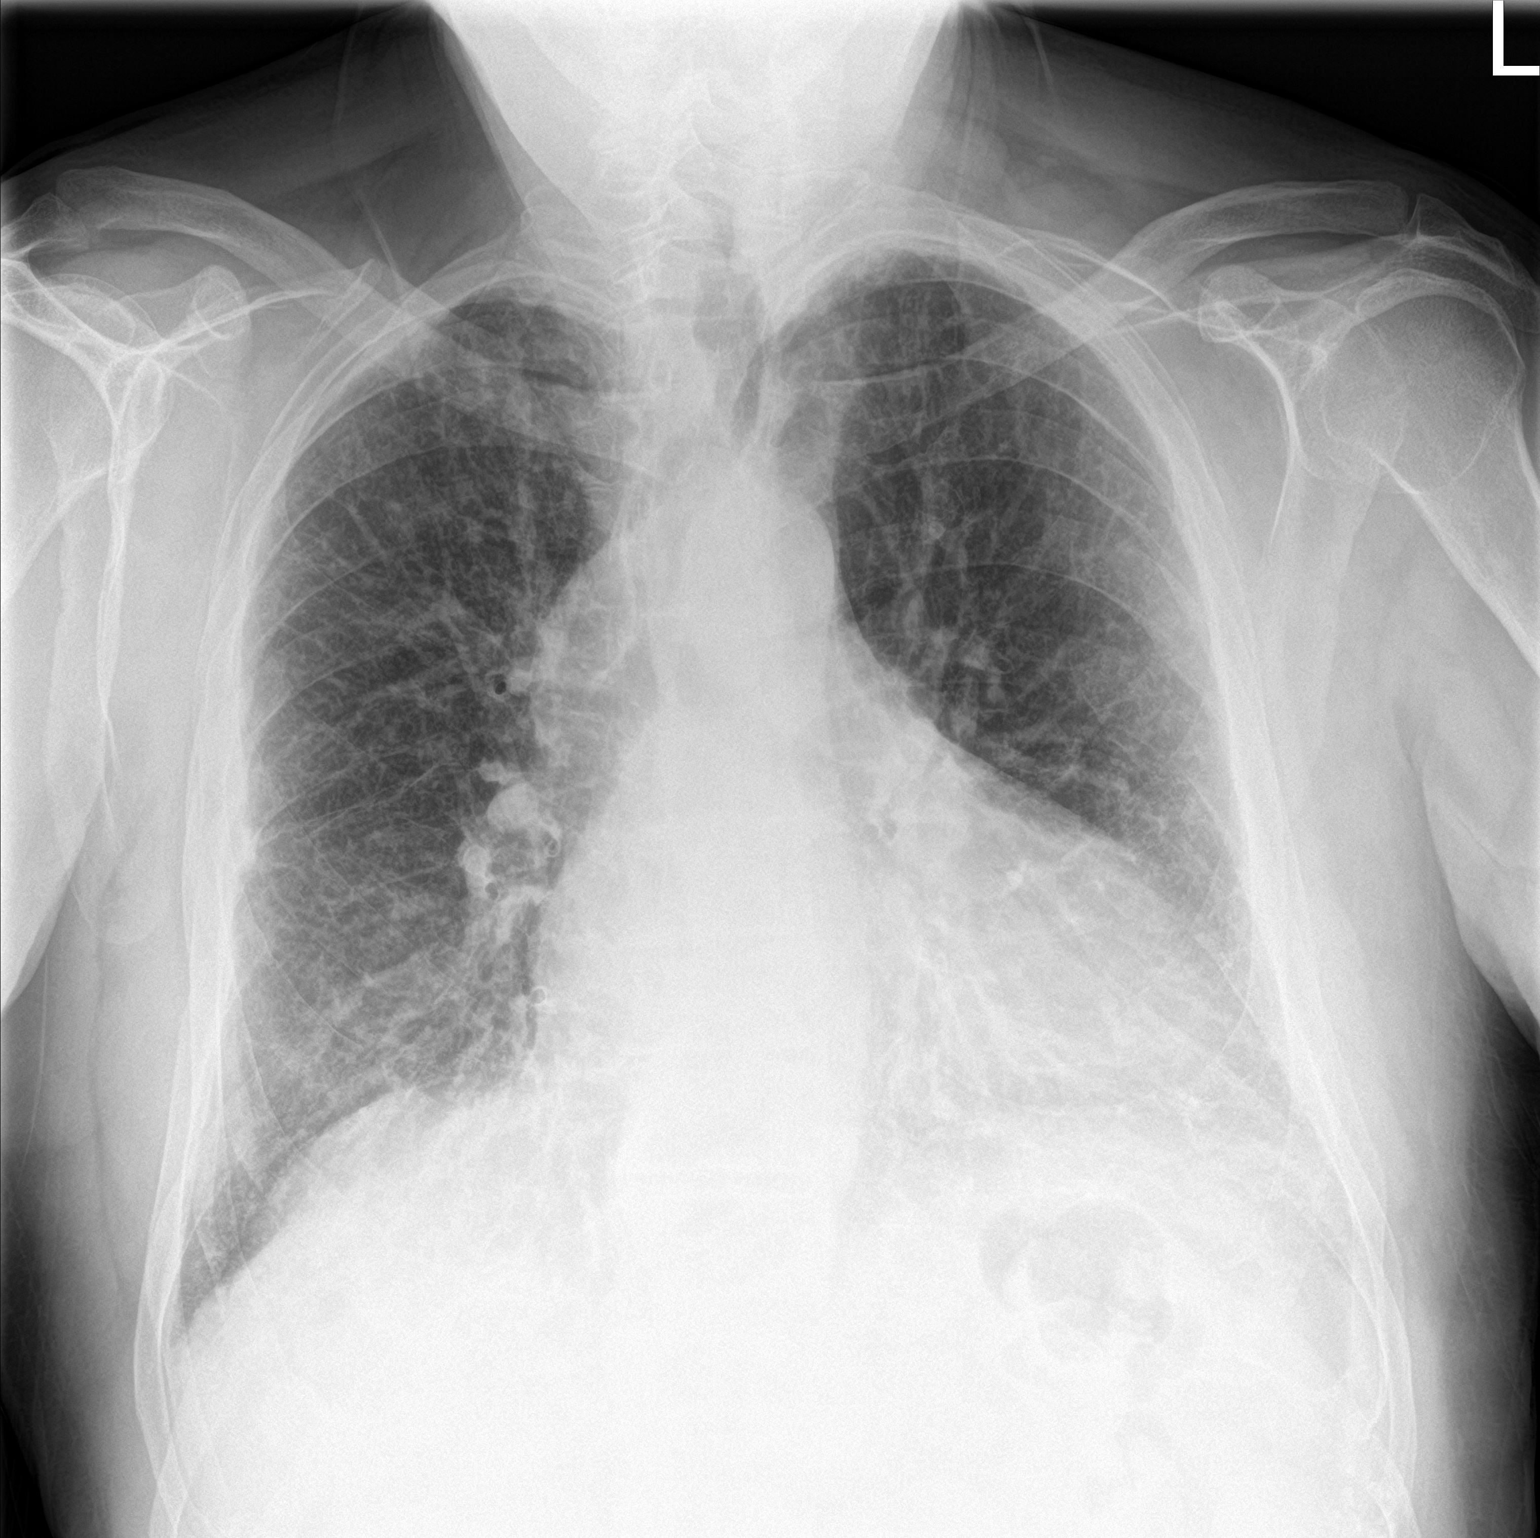
[im 2/2]
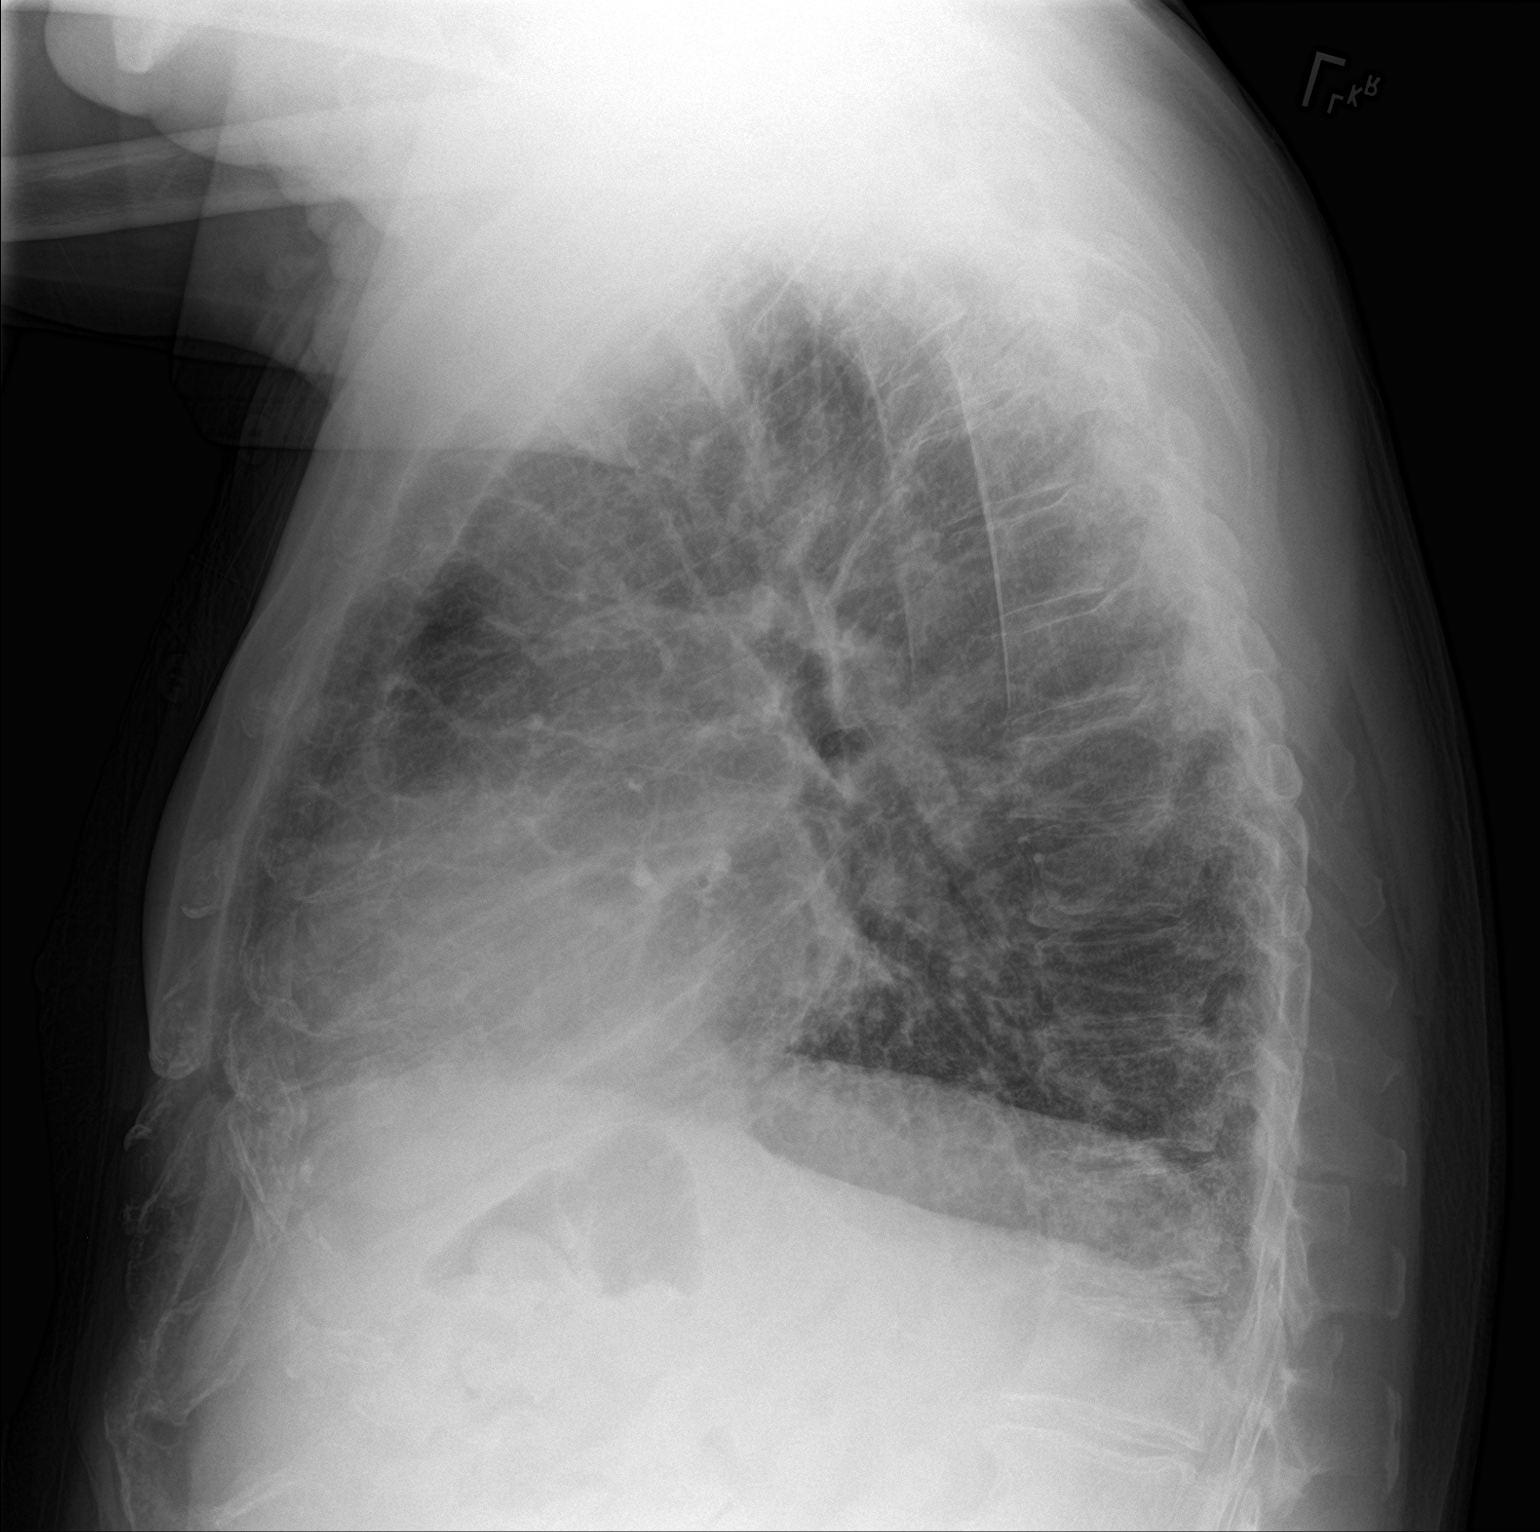

[2 of 2 positions shown; findings below may reference images not displayed]

FINDINGS: There is generalized interstitial prominence felt to represent
edema. There is no airspace consolidation. There is cardiomegaly
with pulmonary venous hypertension. No adenopathy. No bone lesions.
IMPRESSION: Findings felt to be indicative of chronic congestive heart failure.
No airspace consolidation.

## 2018-08-27 ENCOUNTER — Other Ambulatory Visit: Payer: Self-pay

## 2018-08-27 ENCOUNTER — Other Ambulatory Visit
Admission: RE | Admit: 2018-08-27 | Discharge: 2018-08-27 | Disposition: A | Discharge status: Home / Self Care | Payer: Medicare HMO | Source: Ambulatory Visit | Attending: Pain Medicine | Admitting: Pain Medicine

## 2018-08-27 DIAGNOSIS — Z01812 Encounter for preprocedural laboratory examination: Secondary | ICD-10-CM | POA: Diagnosis present

## 2018-08-27 DIAGNOSIS — Z1159 Encounter for screening for other viral diseases: Secondary | ICD-10-CM | POA: Diagnosis not present

## 2018-08-28 ENCOUNTER — Other Ambulatory Visit: Payer: Medicare HMO | Attending: Pain Medicine

## 2018-08-28 LAB — SARS CORONAVIRUS 2 (TAT 6-24 HRS): SARS Coronavirus 2: NEGATIVE

## 2018-09-01 ENCOUNTER — Ambulatory Visit
Admission: RE | Admit: 2018-09-01 | Discharge: 2018-09-01 | Disposition: A | Discharge status: Home / Self Care | Payer: Medicare HMO | Source: Ambulatory Visit | Attending: Pain Medicine | Admitting: Pain Medicine

## 2018-09-01 ENCOUNTER — Ambulatory Visit (HOSPITAL_BASED_OUTPATIENT_CLINIC_OR_DEPARTMENT_OTHER): Payer: Medicare HMO | Admitting: Pain Medicine

## 2018-09-01 ENCOUNTER — Encounter: Payer: Self-pay | Admitting: Pain Medicine

## 2018-09-01 ENCOUNTER — Other Ambulatory Visit: Payer: Self-pay

## 2018-09-01 VITALS — BP 112/61 | HR 62 | Temp 98.4°F | Resp 16 | Ht 74.0 in | Wt 218.0 lb

## 2018-09-01 DIAGNOSIS — M48061 Spinal stenosis, lumbar region without neurogenic claudication: Secondary | ICD-10-CM

## 2018-09-01 DIAGNOSIS — G8929 Other chronic pain: Secondary | ICD-10-CM | POA: Diagnosis not present

## 2018-09-01 DIAGNOSIS — M5416 Radiculopathy, lumbar region: Secondary | ICD-10-CM | POA: Diagnosis not present

## 2018-09-01 DIAGNOSIS — M79604 Pain in right leg: Secondary | ICD-10-CM | POA: Diagnosis not present

## 2018-09-01 DIAGNOSIS — Z7901 Long term (current) use of anticoagulants: Secondary | ICD-10-CM | POA: Insufficient documentation

## 2018-09-01 DIAGNOSIS — M5441 Lumbago with sciatica, right side: Secondary | ICD-10-CM | POA: Insufficient documentation

## 2018-09-01 DIAGNOSIS — M5136 Other intervertebral disc degeneration, lumbar region: Secondary | ICD-10-CM | POA: Diagnosis present

## 2018-09-01 DIAGNOSIS — M5442 Lumbago with sciatica, left side: Secondary | ICD-10-CM | POA: Diagnosis present

## 2018-09-01 MED ORDER — IOHEXOL 180 MG/ML  SOLN
10.0000 mL | Freq: Once | INTRAMUSCULAR | Status: AC
  Administered 2018-09-01: 10 mL via EPIDURAL

## 2018-09-01 MED ORDER — TRIAMCINOLONE ACETONIDE 40 MG/ML IJ SUSP
40.0000 mg | Freq: Once | INTRAMUSCULAR | Status: AC
  Administered 2018-09-01: 11:00:00 40 mg

## 2018-09-01 MED ORDER — ROPIVACAINE HCL 2 MG/ML IJ SOLN
2.0000 mL | Freq: Once | INTRAMUSCULAR | Status: AC
  Administered 2018-09-01: 2 mL via EPIDURAL

## 2018-09-01 MED ORDER — SODIUM CHLORIDE 0.9% FLUSH
2.0000 mL | Freq: Once | INTRAVENOUS | Status: AC
  Administered 2018-09-01: 1 mL

## 2018-09-01 MED ORDER — LIDOCAINE HCL 2 % IJ SOLN: 20.0000 mL | Freq: Once | INTRAMUSCULAR | Status: DC

## 2018-09-01 NOTE — Progress Notes (Addendum)
Patient's Name: Alan Stafford  MRN: [id number]  Referring Provider: Lindwood QuaHoffman, Byron, MD  DOB: 05/13/1945  PCP: Lindwood QuaHoffman, Byron, MD  DOS: 09/01/2018  Note by: Oswaldo DoneFrancisco A Croy Drumwright, MD  Service setting: Ambulatory outpatient  Specialty: Interventional Pain Management  Patient type: Established  Location: [facility] (AMB) Pain Management Facility  Visit type: Interventional Procedure   Primary Reason for Visit: Interventional Pain Management Treatment. CC: Back Pain (low)  Procedure:          Anesthesia, Analgesia, Anxiolysis:  Type: Palliative Inter-Laminar Epidural Steroid Injection           Region: Lumbar Level: L5-S1 Level. Laterality: Right-Sided Paramedial  Type: Local Anesthesia Indication(s): Analgesia         Route: Infiltration ([place]/IM) IV Access: Declined Sedation: Declined  Local Anesthetic: Lidocaine 1-2%  Position: Prone with head of the table was raised to facilitate breathing.   Indications: 1. Chronic lower extremity pain (Primary Source of Pain) (Bilateral) (R>L)   2. Chronic lumbar radicular pain (Right)   3. DDD (degenerative disc disease), lumbar   4. Lumbar foraminal stenosis (severe right L4-5)   5. Chronic low back pain (Secondary source of pain) (Bilateral) (R>L)   6. Chronic anticoagulation (Coumadin)   7. Long term current use of anticoagulant therapy (Coumadin)    Pain Score: Pre-procedure: 7 /10 Post-procedure: 0-No pain/10  Pre-op Assessment:  [name] is a 73 y.o. (year old), male patient, seen today for interventional treatment. He  has a past surgical history that includes Knee surgery (Right, 2015); Eye surgery (Right, 2015); Joint replacement (Right); and Cataract extraction w/PHACO (Left, 09/26/2016). [name] has a current medication list which includes the following prescription(s): albuterol, aspirin ec, fifty50 glucose meter 2.0, budesonide-formoterol, enalapril, escitalopram, flecainide, glucose blood, glucose blood, glucose blood, metformin, methimazole,  metoprolol succinate, nitroglycerin, omeprazole, sildenafil, trazodone, warfarin, enalapril, nicotine, phenazopyridine, pregabalin, and testosterone cypionate, and the following Facility-Administered Medications: lidocaine. His primarily concern today is the Back Pain (low)  Initial Vital Signs:  Pulse/HCG Rate: 62ECG Heart Rate: 62 Temp: 98.4 F (36.9 C) Resp: 16 BP: 122/73 SpO2: 98 %  BMI: Estimated body mass index is 27.99 kg/m as calculated from the following:   Height as of this encounter: 6\' 2"  (1.88 m).   Weight as of this encounter: 218 lb (98.9 kg).  Risk Assessment: Allergies: Reviewed. He is allergic to statins and sulfa antibiotics.  Allergy Precautions: None required Coagulopathies: Reviewed. None identified.  Blood-thinner therapy: None at this time Active Infection(s): Reviewed. None identified. [name] is afebrile  Site Confirmation: [name] was asked to confirm the procedure and laterality before marking the site Procedure checklist: Completed Consent: Before the procedure and under the influence of no sedative(s), amnesic(s), or anxiolytics, the patient was informed of the treatment options, risks and possible complications. To fulfill our ethical and legal obligations, as recommended by the American Medical Association's Code of Ethics, I have informed the patient of my clinical impression; the nature and purpose of the treatment or procedure; the risks, benefits, and possible complications of the intervention; the alternatives, including doing nothing; the risk(s) and benefit(s) of the alternative treatment(s) or procedure(s); and the risk(s) and benefit(s) of doing nothing. The patient was provided information about the general risks and possible complications associated with the procedure. These may include, but are not limited to: failure to achieve desired goals, infection, bleeding, organ or nerve damage, allergic reactions, paralysis, and death. In addition,  the patient was informed of those risks and complications associated  to Spine-related procedures, such as failure to decrease pain; infection (i.e.: Meningitis, epidural or intraspinal abscess); bleeding (i.e.: epidural hematoma, subarachnoid hemorrhage, or any other type of intraspinal or peri-dural bleeding); organ or nerve damage (i.e.: Any type of peripheral nerve, nerve root, or spinal cord injury) with subsequent damage to sensory, motor, and/or autonomic systems, resulting in permanent pain, numbness, and/or weakness of one or several areas of the body; allergic reactions; (i.e.: anaphylactic reaction); and/or death. Furthermore, the patient was informed of those risks and complications associated with the medications. These include, but are not limited to: allergic reactions (i.e.: anaphylactic or anaphylactoid reaction(s)); adrenal axis suppression; blood sugar elevation that in diabetics may result in ketoacidosis or comma; water retention that in patients with history of congestive heart failure may result in shortness of breath, pulmonary edema, and decompensation with resultant heart failure; weight gain; swelling or edema; medication-induced neural toxicity; particulate matter embolism and blood vessel occlusion with resultant organ, and/or nervous system infarction; and/or aseptic necrosis of one or more joints. Finally, the patient was informed that Medicine is not an exact science; therefore, there is also the possibility of unforeseen or unpredictable risks and/or possible complications that may result in a catastrophic outcome. The patient indicated having understood very clearly. We have given the patient no guarantees and we have made no promises. Enough time was given to the patient to ask questions, all of which were answered to the patient's satisfaction. [name] has indicated that he wanted to continue with the procedure. Attestation: I, the ordering provider, attest that I have  discussed with the patient the benefits, risks, side-effects, alternatives, likelihood of achieving goals, and potential problems during recovery for the procedure that I have provided informed consent. Date  Time: 09/01/2018 10:25 AM  Pre-Procedure Preparation:  Monitoring: As per clinic protocol. Respiration, ETCO2, SpO2, BP, heart rate and rhythm monitor placed and checked for adequate function Safety Precautions: Patient was assessed for positional comfort and pressure points before starting the procedure. Time-out: I initiated and conducted the "Time-out" before starting the procedure, as per protocol. The patient was asked to participate by confirming the accuracy of the "Time Out" information. Verification of the correct person, site, and procedure were performed and confirmed by me, the nursing staff, and the patient. "Time-out" conducted as per Joint Commission's Universal Protocol (UP.01.01.01). Time: 1113  Description of Procedure:          Target Area: The interlaminar space, initially targeting the lower laminar border of the superior vertebral body. Approach: Paramedial approach. Area Prepped: Entire Posterior Lumbar Region Prepping solution: DuraPrep (Iodine Povacrylex [0.7% available iodine] and Isopropyl Alcohol, 74% w/w) Safety Precautions: Aspiration looking for blood return was conducted prior to all injections. At no point did we inject any substances, as a needle was being advanced. No attempts were made at seeking any paresthesias. Safe injection practices and needle disposal techniques used. Medications properly checked for expiration dates. SDV (single dose vial) medications used. Description of the Procedure: Protocol guidelines were followed. The procedure needle was introduced through the skin, ipsilateral to the reported pain, and advanced to the target area. Bone was contacted and the needle walked caudad, until the lamina was cleared. The epidural space was identified  using "loss-of-resistance technique" with 2-3 ml of PF-NaCl (0.9% NSS), in a 5cc LOR glass syringe.  Vitals:   09/01/18 1026 09/01/18 1102 09/01/18 1112 09/01/18 1122  BP: 122/73 (!) 118/59 (!) 100/54 112/61  Pulse:      Resp:  14  14 16  Temp:      SpO2:  98% 98% 99%  Weight:      Height:        Start Time: 1114 hrs. End Time: 1117 hrs.  Materials:  Needle(s) Type: Epidural needle Gauge: 17G Length: 3.5-in Medication(s): Please see orders for medications and dosing details.  Imaging Guidance (Spinal):          Type of Imaging Technique: Fluoroscopy Guidance (Spinal) Indication(s): Assistance in needle guidance and placement for procedures requiring needle placement in or near specific anatomical locations not easily accessible without such assistance. Exposure Time: Please see nurses notes. Contrast: Before injecting any contrast, we confirmed that the patient did not have an allergy to iodine, shellfish, or radiological contrast. Once satisfactory needle placement was completed at the desired level, radiological contrast was injected. Contrast injected under live fluoroscopy. No contrast complications. See chart for type and volume of contrast used. Fluoroscopic Guidance: I was personally present during the use of fluoroscopy. "Tunnel Vision Technique" used to obtain the best possible view of the target area. Parallax error corrected before commencing the procedure. "Direction-depth-direction" technique used to introduce the needle under continuous pulsed fluoroscopy. Once target was reached, antero-posterior, oblique, and lateral fluoroscopic projection used confirm needle placement in all planes. Images permanently stored in EMR. Interpretation: I personally interpreted the imaging intraoperatively. Adequate needle placement confirmed in multiple planes. Appropriate spread of contrast into desired area was observed. No evidence of afferent or efferent intravascular uptake. No  intrathecal or subarachnoid spread observed. Permanent images saved into the patient's record.  Antibiotic Prophylaxis:   Anti-infectives (From admission, onward)   None     Indication(s): None identified  Post-operative Assessment:  Post-procedure Vital Signs:  Pulse/HCG Rate: 6260 Temp: 98.4 F (36.9 C) Resp: 16 BP: 112/61 SpO2: 99 %  EBL: None  Complications: No immediate post-treatment complications observed by team, or reported by patient.  Note: The patient tolerated the entire procedure well. A repeat set of vitals were taken after the procedure and the patient was kept under observation following institutional policy, for this type of procedure. Post-procedural neurological assessment was performed, showing return to baseline, prior to discharge. The patient was provided with post-procedure discharge instructions, including a section on how to identify potential problems. Should any problems arise concerning this procedure, the patient was given instructions to immediately contact us, at any time, without hesitation. In any case, we plan to contact the patient by telephone for a follow-up status report regarding this interventional procedure.  Comments:  No additional relevant information.  Plan of Care  Orders:  Orders Placed This Encounter  Procedures  . Lumbar Epidural Injection    Scheduling Instructions:     Procedure: Interlaminar LESI L5-S1     Laterality: Right-sided     Sedation: No Sedation     Timeframe:  Today    Order Specific Question:   Where will this procedure be performed?    Answer:   [facility]  . DG PAIN CLINIC C-ARM 1-60 MIN NO REPORT    Intraoperative interpretation by procedural physician at [facility].    Standing Status:   Standing    Number of Occurrences:   1    Order Specific Question:   Reason for exam:    Answer:   Assistance in needle guidance and placement for procedures requiring needle placement in or near  specific anatomical locations not easily accessible without such assistance.  . Care order/instruction: Please confirm that the  patient has stopped the Coumadin (Warfarin) X 5 days prior to procedure or surgery.    Please confirm that the patient has stopped the Coumadin (Warfarin) X 5 days prior to procedure or surgery.    Standing Status:   Standing    Number of Occurrences:   1  . Provider attestation of informed consent for procedure/surgical case    I, the ordering provider, attest that I have discussed with the patient the benefits, risks, side effects, alternatives, likelihood of achieving goals and potential problems during recovery for the procedure that I have provided informed consent.    Standing Status:   Standing    Number of Occurrences:   1  . Informed Consent Details: Transcribe to consent form and obtain patient signature    Standing Status:   Standing    Number of Occurrences:   1    Order Specific Question:   Procedure    Answer:   Lumbar epidural steroid injection under fluoroscopic guidance. (See notes for level and laterality.)    Order Specific Question:   Surgeon    Answer:   Adison Jerger A. Dossie Arbour, MD    Order Specific Question:   Indication/Reason    Answer:   Low back pain and/or leg pain secondary to lumbar radiculitis/radiculopathy   Chronic Opioid Analgesic:  Hydrocodone/APAP 10/325 1 tablet p.o. every 6 hours (40 mg/day of hydrocodone) (not prescribed by our practice) MME: 40 mg/day.   Medications ordered for procedure: Meds ordered this encounter  Medications  . iohexol (OMNIPAQUE) 180 MG/ML injection 10 mL    Must be Myelogram-compatible. If not available, you may substitute with a water-soluble, non-ionic, hypoallergenic, myelogram-compatible radiological contrast medium.  Marland Kitchen lidocaine (XYLOCAINE) 2 % (with pres) injection 400 mg  . sodium chloride flush (NS) 0.9 % injection 2 mL  . ropivacaine (PF) 2 mg/mL (0.2%) (NAROPIN) injection 2 mL  . triamcinolone  acetonide (KENALOG-40) injection 40 mg   Medications administered: We administered iohexol, sodium chloride flush, ropivacaine (PF) 2 mg/mL (0.2%), and triamcinolone acetonide.  See the medical record for exact dosing, route, and time of administration.  Follow-up plan:   Return in about 2 weeks (around 09/15/2018) for (VV), E/M (PP).      Considering:   Palliative interventions for the time being.   Palliative PRN treatment(s):   Palliative right-sided L4-5 transforaminal ESI  Palliative right-sided L5-S1 interlaminar LESI     Recent Visits No visits were found meeting these conditions.  Showing recent visits within past 90 days and meeting all other requirements   Today's Visits Date Type Provider Dept  09/01/18 Procedure visit Milinda Pointer, MD [facility]  Showing today's visits and meeting all other requirements   Future Appointments Date Type Provider Dept  09/17/18 Appointment Milinda Pointer, MD [facility]  Showing future appointments within next 90 days and meeting all other requirements   Disposition: Discharge home  Discharge Date & Time: 09/01/2018; 1126 hrs.   Primary Care Physician: Raelene Bott, MD Location: [facility] Note by: Gaspar Cola, MD Date: 09/01/2018; Time: 11:42 AM  Disclaimer:  Medicine is not an Chief Strategy Officer. The only guarantee in medicine is that nothing is guaranteed. It is important to note that the decision to proceed with this intervention was based on the information collected from the patient. The Data and conclusions were drawn from the patient's questionnaire, the interview, and the physical examination. Because the information was provided in large part by the patient, it cannot  be guaranteed that it has not been purposely or unconsciously manipulated. Every effort has been made to obtain as much relevant data as possible for this evaluation. It is important to note that the  conclusions that lead to this procedure are derived in large part from the available data. Always take into account that the treatment will also be dependent on availability of resources and existing treatment guidelines, considered by other Pain Management Practitioners as being common knowledge and practice, at the time of the intervention. For Medico-Legal purposes, it is also important to point out that variation in procedural techniques and pharmacological choices are the acceptable norm. The indications, contraindications, technique, and results of the above procedure should only be interpreted and judged by a Board-Certified Interventional Pain Specialist with extensive familiarity and expertise in the same exact procedure and technique.

## 2018-09-01 NOTE — Patient Instructions (Addendum)

## 2018-09-01 NOTE — Progress Notes (Signed)
Safety precautions to be maintained throughout the outpatient stay will include: orient to surroundings, keep bed in low position, maintain call bell within reach at all times, provide assistance with transfer out of bed and ambulation.  

## 2018-09-02 ENCOUNTER — Telehealth: Payer: Self-pay | Admitting: *Deleted

## 2018-09-02 NOTE — Telephone Encounter (Signed)
Spoke with patient denies any questions or concerns from procedure.

## 2018-09-16 ENCOUNTER — Encounter: Payer: Self-pay | Admitting: Pain Medicine

## 2018-09-17 ENCOUNTER — Ambulatory Visit: Payer: Medicare HMO | Attending: Pain Medicine | Admitting: Pain Medicine

## 2018-09-17 ENCOUNTER — Other Ambulatory Visit: Payer: Self-pay

## 2018-09-17 DIAGNOSIS — G8929 Other chronic pain: Secondary | ICD-10-CM

## 2018-09-17 DIAGNOSIS — S32020S Wedge compression fracture of second lumbar vertebra, sequela: Secondary | ICD-10-CM

## 2018-09-17 DIAGNOSIS — M5137 Other intervertebral disc degeneration, lumbosacral region: Secondary | ICD-10-CM

## 2018-09-17 DIAGNOSIS — M5442 Lumbago with sciatica, left side: Secondary | ICD-10-CM | POA: Diagnosis not present

## 2018-09-17 DIAGNOSIS — M79604 Pain in right leg: Secondary | ICD-10-CM | POA: Diagnosis not present

## 2018-09-17 DIAGNOSIS — R937 Abnormal findings on diagnostic imaging of other parts of musculoskeletal system: Secondary | ICD-10-CM | POA: Diagnosis not present

## 2018-09-17 DIAGNOSIS — M5441 Lumbago with sciatica, right side: Secondary | ICD-10-CM

## 2018-09-17 DIAGNOSIS — M48061 Spinal stenosis, lumbar region without neurogenic claudication: Secondary | ICD-10-CM

## 2018-09-17 DIAGNOSIS — M4807 Spinal stenosis, lumbosacral region: Secondary | ICD-10-CM | POA: Insufficient documentation

## 2018-09-17 NOTE — Patient Instructions (Signed)
____________________________________________________________________________________________  Preparing for your procedure (without sedation)  Procedure appointments are limited to planned procedures: . No Prescription Refills. . No disability issues will be discussed. . No medication changes will be discussed.  Instructions: . Oral Intake: Do not eat or drink anything for at least 3 hours prior to your procedure. . Transportation: Unless otherwise stated by your physician, you may drive yourself after the procedure. . Blood Pressure Medicine: Take your blood pressure medicine with a sip of water the morning of the procedure. . Blood thinners: Notify our staff if you are taking any blood thinners. Depending on which one you take, there will be specific instructions on how and when to stop it. . Diabetics on insulin: Notify the staff so that you can be scheduled 1st case in the morning. If your diabetes requires high dose insulin, take only  of your normal insulin dose the morning of the procedure and notify the staff that you have done so. . Preventing infections: Shower with an antibacterial soap the morning of your procedure.  . Build-up your immune system: Take 1000 mg of Vitamin C with every meal (3 times a day) the day prior to your procedure. . Antibiotics: Inform the staff if you have a condition or reason that requires you to take antibiotics before dental procedures. . Pregnancy: If you are pregnant, call and cancel the procedure. . Sickness: If you have a cold, fever, or any active infections, call and cancel the procedure. . Arrival: You must be in the facility at least 30 minutes prior to your scheduled procedure. . Children: Do not bring any children with you. . Dress appropriately: Bring dark clothing that you would not mind if they get stained. . Valuables: Do not bring any jewelry or valuables.  Reasons to call and reschedule or cancel your procedure: (Following these  recommendations will minimize the risk of a serious complication.) . Surgeries: Avoid having procedures within 2 weeks of any surgery. (Avoid for 2 weeks before or after any surgery). . Flu Shots: Avoid having procedures within 2 weeks of a flu shots or . (Avoid for 2 weeks before or after immunizations). . Barium: Avoid having a procedure within 7-10 days after having had a radiological study involving the use of radiological contrast. (Myelograms, Barium swallow or enema study). . Heart attacks: Avoid any elective procedures or surgeries for the initial 6 months after a "Myocardial Infarction" (Heart Attack). . Blood thinners: It is imperative that you stop these medications before procedures. Let us know if you if you take any blood thinner.  . Infection: Avoid procedures during or within two weeks of an infection (including chest colds or gastrointestinal problems). Symptoms associated with infections include: Localized redness, fever, chills, night sweats or profuse sweating, burning sensation when voiding, cough, congestion, stuffiness, runny nose, sore throat, diarrhea, nausea, vomiting, cold or Flu symptoms, recent or current infections. It is specially important if the infection is over the area that we intend to treat. . Heart and lung problems: Symptoms that may suggest an active cardiopulmonary problem include: cough, chest pain, breathing difficulties or shortness of breath, dizziness, ankle swelling, uncontrolled high or unusually low blood pressure, and/or palpitations. If you are experiencing any of these symptoms, cancel your procedure and contact your primary care physician for an evaluation.  Remember:  Regular Business hours are:  Monday to Thursday 8:00 AM to 4:00 PM  Provider's Schedule: Claude Waldman, MD:  Procedure days: Tuesday and Thursday 7:30 AM to 4:00 PM  Bilal   Lateef, MD:  Procedure days: Monday and Wednesday 7:30 AM to 4:00  PM ____________________________________________________________________________________________    

## 2018-09-17 NOTE — Progress Notes (Signed)
Pain Management Virtual Encounter Note - Virtual Visit via Telephone Telehealth (real-time audio visits between healthcare provider and patient).   Patient's Phone No. & Preferred Pharmacy:  [phone] (home); There is no such number on file (mobile).; (Preferred) [phone] No e-mail address on record  [id number] - [place], [place] - [address] [facility] [address] [place] [address] Phone: [phone] Fax: [phone]    Pre-screening note:  Our staff contacted Alan Stafford and offered him an "in person", "face-to-face" appointment versus a telephone encounter. He indicated preferring the telephone encounter, at this time.   Reason for Virtual Visit: COVID-19*  Social distancing based on CDC and AMA recommendations.   I contacted Alan Stafford on 09/17/2018 via telephone.      I clearly identified myself as Oswaldo DoneFrancisco A Asmar Brozek, MD. I verified that I was speaking with the correct person using two identifiers (Name: Alan Stafford, and date of birth: 1946/02/09).  Advanced Informed Consent I sought verbal advanced consent from Alan Stafford for virtual visit interactions. I informed Alan Stafford of possible security and privacy concerns, risks, and limitations associated with providing "not-in-person" medical evaluation and management services. I also informed Alan Stafford of the availability of "in-person" appointments. Finally, I informed him that there would be a charge for the virtual visit and that he could be  personally, fully or partially, financially responsible for it. Alan Stafford expressed understanding and agreed to proceed.   Historic Elements   Alan Stafford is a 73 y.o. year old, male patient evaluated today after his last encounter by our practice on 09/02/2018. Alan Stafford  has a past medical history of A-fib [facility](HCC), Abnormal heart rhythm, Arthritis, Asthma, CAP (community acquired pneumonia) (04/22/2016), Diabetes mellitus without complication  (HCC), Dysrhythmia, GERD (gastroesophageal reflux disease), Hemoptysis (04/22/2016), Hypercholesteremia, Hypertension, Hyperthyroidism, Kidney stones, and Sepsis (HCC) (04/22/2016). He also  has a past surgical history that includes Knee surgery (Right, 2015); Eye surgery (Right, 2015); Joint replacement (Right); and Cataract extraction w/PHACO (Left, 09/26/2016). Alan Stafford has a current medication list which includes the following prescription(s): albuterol, aspirin ec, fifty50 glucose meter 2.0, budesonide-formoterol, enalapril, escitalopram, flecainide, glucose blood, glucose blood, glucose blood, metformin, methimazole, metoprolol succinate, nitroglycerin, omeprazole, phenazopyridine, sildenafil, trazodone, and warfarin. He  reports that he has never smoked. He uses smokeless tobacco. He reports that he does not drink alcohol or use drugs. Alan Stafford is allergic to statins and sulfa antibiotics.   HPI  Today, he is being contacted for a post-procedure assessment.  Post-Procedure Evaluation  Procedure: Palliative right-sided L5-S1 lumbar epidural steroid injection under fluoroscopic guidance, no sedation Pre-procedure pain level:  7/10 Post-procedure: 0/10 (100% relief)  Sedation: None.  Effectiveness during initial hour after procedure(Ultra-Short Term Relief): 100 %   Local anesthetic used: Long-acting (4-6 hours) Effectiveness: Defined as any analgesic benefit obtained secondary to the administration of local anesthetics. This carries significant diagnostic value as to the etiological location, or anatomical origin, of the pain. Duration of benefit is expected to coincide with the duration of the local anesthetic used.  Effectiveness during initial 4-6 hours after procedure(Short-Term Relief): 100 %   Long-term benefit: Defined as any relief past the pharmacologic duration of the local anesthetics.  Effectiveness past the initial 6 hours after procedure(Long-Term Relief): 90 %   Current benefits:  Defined as benefit that persist at this time.   Analgesia:  90-100% better Function: Alan Stafford reports improvement in function ROM: Alan Stafford reports improvement in ROM  Note: The patient refers still having pain in the area of the left leg and therefore he would like to have an epidural done on that side.  Pharmacotherapy Assessment  Analgesic: Hydrocodone/APAP 10/325, 1 tab PO q 6 hrs (40 mg/day of hydrocodone) (not prescribed by our practice) MME: 40 mg/day.   Monitoring: Pharmacotherapy: No side-effects or adverse reactions reported. [place] PMP: PDMP reviewed during this encounter.       Compliance: No problems identified. Effectiveness: Clinically acceptable. Plan: Refer to "POC".  Pertinent Labs   SAFETY SCREENING Profile Lab Results  Component Value Date   SARSCOV2NAA NEGATIVE 08/27/2018   MRSAPCR NEGATIVE 05/18/2016   Renal Function Lab Results  Component Value Date   BUN 48 (H) 05/23/2016   CREATININE 1.83 (H) 05/23/2016   GFRAA 41 (L) 05/23/2016   GFRNONAA 36 (L) 05/23/2016   Hepatic Function Lab Results  Component Value Date   AST 53 (H) 05/18/2016   ALT 37 05/18/2016   ALBUMIN 3.3 (L) 05/18/2016   UDS No results found for: SUMMARY Note: Above Lab results reviewed.  Recent imaging  DG PAIN CLINIC C-ARM 1-60 MIN NO REPORT Fluoro was used, but no Radiologist interpretation will be provided.  Please refer to "NOTES" tab for provider progress note.  Assessment  The primary encounter diagnosis was Chronic lower extremity pain (Primary Source of Pain) (Bilateral) (R>L). 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  Plan of Care  I have discontinued Ruperto Kiernan. Westbrook's  pregabalin, nicotine, and testosterone cypionate. I am also having him maintain his albuterol, glucose blood, nitroGLYCERIN, Fifty50 Glucose Meter 2.0, escitalopram, glucose blood, aspirin EC, omeprazole, methimazole, flecainide, metoprolol succinate, metFORMIN, sildenafil, glucose blood, phenazopyridine, budesonide-formoterol, traZODone, warfarin, and enalapril.  Pharmacotherapy (Medications Ordered): No orders of the defined types were placed in this encounter.  Orders:  Orders Placed This Encounter  Procedures  . Lumbar Epidural Injection    Standing Status:   Future    Standing Expiration Date:   10/18/2018    Scheduling Instructions:     Procedure: Interlaminar Lumbar Epidural Steroid injection (LESI)  L3-4     Laterality: Left-sided     Sedation: Patient's choice.     Timeframe: ASAA    Order Specific Question:   Where will this procedure be performed?    Answer:   [facility]   Follow-up plan:   Return for Procedure (no sedation): (L) L3-4 LESI #1.      Considering:   Diagnostic left L3-4 interlaminar LESI #1    Palliative PRN treatment(s):   Palliative right L4-5 TFESI  Palliative right L5-S1 interlaminar LESI      Recent Visits Date Type Provider Dept  09/01/18 Procedure visit Milinda Pointer, MD [facility]  Showing recent visits within past 90 days and meeting all other requirements   Today's Visits Date Type Provider Dept  09/17/18 Office Visit Milinda Pointer, MD [facility]  Showing today's visits and meeting all other requirements   Future Appointments No visits were found meeting these conditions.  Showing future appointments within next 90 days and meeting all other requirements   I discussed the assessment and treatment plan with the patient. The patient was provided an opportunity to ask questions and all were answered. The patient agreed with the  plan and demonstrated an understanding of the instructions.  Patient  advised to call back or seek an in-person evaluation if the symptoms or condition worsens.  Total duration of non-face-to-face encounter: 15 minutes.  Note by: Oswaldo DoneFrancisco A Akera Snowberger, MD Date: 09/17/2018; Time: 3:21 PM  Note: This dictation was prepared with Dragon dictation. Any transcriptional errors that may result from this process are unintentional.  Disclaimer:  * Given the special circumstances of the COVID-19 pandemic, the federal government has announced that the Office for Civil Rights (OCR) will exercise its enforcement discretion and will not impose penalties on physicians using telehealth in the event of noncompliance with regulatory requirements under the [employer] Insurance Portability and Accountability Act (HIPAA) in connection with the good faith provision of telehealth during the COVID-19 national public health emergency. (AMA)

## 2018-10-15 ENCOUNTER — Ambulatory Visit: Payer: Medicare HMO | Attending: Pain Medicine | Admitting: Pain Medicine

## 2018-10-15 NOTE — Progress Notes (Deleted)
No show

## 2018-10-29 ENCOUNTER — Other Ambulatory Visit: Payer: Self-pay

## 2018-10-29 ENCOUNTER — Encounter: Payer: Self-pay | Admitting: Pain Medicine

## 2018-10-29 ENCOUNTER — Ambulatory Visit (HOSPITAL_BASED_OUTPATIENT_CLINIC_OR_DEPARTMENT_OTHER): Payer: Medicare HMO | Admitting: Pain Medicine

## 2018-10-29 ENCOUNTER — Ambulatory Visit
Admission: RE | Admit: 2018-10-29 | Discharge: 2018-10-29 | Disposition: A | Discharge status: Home / Self Care | Payer: Medicare HMO | Source: Ambulatory Visit | Attending: Pain Medicine | Admitting: Pain Medicine

## 2018-10-29 VITALS — BP 128/82 | HR 56 | Temp 98.0°F | Resp 13 | Ht 74.0 in | Wt 214.0 lb

## 2018-10-29 DIAGNOSIS — M5416 Radiculopathy, lumbar region: Secondary | ICD-10-CM | POA: Insufficient documentation

## 2018-10-29 DIAGNOSIS — S32020S Wedge compression fracture of second lumbar vertebra, sequela: Secondary | ICD-10-CM | POA: Insufficient documentation

## 2018-10-29 DIAGNOSIS — M5442 Lumbago with sciatica, left side: Secondary | ICD-10-CM | POA: Diagnosis present

## 2018-10-29 DIAGNOSIS — M5441 Lumbago with sciatica, right side: Secondary | ICD-10-CM | POA: Insufficient documentation

## 2018-10-29 DIAGNOSIS — M5137 Other intervertebral disc degeneration, lumbosacral region: Secondary | ICD-10-CM | POA: Diagnosis not present

## 2018-10-29 DIAGNOSIS — M48061 Spinal stenosis, lumbar region without neurogenic claudication: Secondary | ICD-10-CM

## 2018-10-29 DIAGNOSIS — M47816 Spondylosis without myelopathy or radiculopathy, lumbar region: Secondary | ICD-10-CM | POA: Diagnosis present

## 2018-10-29 DIAGNOSIS — G8929 Other chronic pain: Secondary | ICD-10-CM | POA: Diagnosis present

## 2018-10-29 DIAGNOSIS — M4807 Spinal stenosis, lumbosacral region: Secondary | ICD-10-CM

## 2018-10-29 DIAGNOSIS — M25551 Pain in right hip: Secondary | ICD-10-CM | POA: Insufficient documentation

## 2018-10-29 DIAGNOSIS — M533 Sacrococcygeal disorders, not elsewhere classified: Secondary | ICD-10-CM | POA: Diagnosis present

## 2018-10-29 DIAGNOSIS — M79604 Pain in right leg: Secondary | ICD-10-CM | POA: Diagnosis present

## 2018-10-29 DIAGNOSIS — M51379 Other intervertebral disc degeneration, lumbosacral region without mention of lumbar back pain or lower extremity pain: Secondary | ICD-10-CM

## 2018-10-29 MED ORDER — ROPIVACAINE HCL 2 MG/ML IJ SOLN
2.0000 mL | Freq: Once | INTRAMUSCULAR | Status: AC
  Administered 2018-10-29: 12:00:00 2 mL via EPIDURAL
  Filled 2018-10-29: qty 10

## 2018-10-29 MED ORDER — IOHEXOL 180 MG/ML  SOLN
10.0000 mL | Freq: Once | INTRAMUSCULAR | Status: AC
  Administered 2018-10-29: 10 mL via EPIDURAL

## 2018-10-29 MED ORDER — SODIUM CHLORIDE 0.9% FLUSH
2.0000 mL | Freq: Once | INTRAVENOUS | Status: AC
  Administered 2018-10-29: 2 mL

## 2018-10-29 MED ORDER — LIDOCAINE HCL 2 % IJ SOLN
20.0000 mL | Freq: Once | INTRAMUSCULAR | Status: AC
  Administered 2018-10-29: 400 mg
  Filled 2018-10-29: qty 20

## 2018-10-29 MED ORDER — TRIAMCINOLONE ACETONIDE 40 MG/ML IJ SUSP
40.0000 mg | Freq: Once | INTRAMUSCULAR | Status: AC
  Administered 2018-10-29: 40 mg
  Filled 2018-10-29: qty 1

## 2018-10-29 NOTE — Progress Notes (Signed)
Patient's Name: Alan Stafford  MRN: [id number]  Referring Provider: Raelene Bott, MD  DOB: [birth year]  PCP: Raelene Bott, MD  DOS: 10/29/2018  Note by: Gaspar Cola, MD  Service setting: Ambulatory outpatient  Specialty: Interventional Pain Management  Patient type: Established  Location: [facility] (AMB) Pain Management Facility  Visit type: Interventional Procedure   Primary Reason for Visit: Interventional Pain Management Treatment. CC: Back Pain (lower)  Procedure:          Anesthesia, Analgesia, Anxiolysis:  Type: Therapeutic Inter-Laminar Epidural Steroid Injection  #5  Region: Lumbar Level: L4-5 Level. Laterality: Right Paramedial  Type: Local Anesthesia Indication(s): Analgesia         Route: Infiltration ([place]/IM) IV Access: Declined Sedation: Declined  Local Anesthetic: Lidocaine 1-2%  Position: Prone with head of the table was raised to facilitate breathing.   Indications: 1. DDD (degenerative disc disease), lumbosacral   2. 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 Lumbosacral spinal stenosis (Multilevel)   4. Lumbar spondylosis   5. Compression fracture of L2 lumbar vertebra, sequela   6. Chronic low back pain (Secondary source of pain) (Bilateral) (R>L)   7. Chronic lower extremity pain (Primary Source of Pain) (Bilateral) (R>L)   8. Chronic lumbar radicular pain (Right)    Pain Score: Pre-procedure: 7 /10 Post-procedure: 0-No pain/10   Pre-op Assessment:  Mr. Advani is a 73 y.o. (year old), male patient, seen today for interventional treatment. He  has a past surgical history that includes Knee surgery (Right, 2015); Eye surgery (Right, 2015); Joint replacement (Right); and Cataract extraction w/PHACO (Left, 09/26/2016). Mr. Gladden has a current medication list which includes the following prescription(s): albuterol, aspirin ec, fifty50 glucose meter 2.0, budesonide-formoterol, enalapril, escitalopram, flecainide, glucose blood,  glucose blood, glucose blood, metformin, methimazole, metoprolol succinate, nitroglycerin, omeprazole, phenazopyridine, sildenafil, trazodone, and warfarin. His primarily concern today is the Back Pain (lower)  Today the patient indicates having a lot of pain in the "right hip area".  However, when he pointed out that he was showing me the area of the right sacroiliac joint and PSIS.  We went ahead and did hyperextension and rotation which did trigger pain in that area but we also did a Patrick maneuver which trigger pain in the right hip and the right sacroiliac joint area.  I reviewed his x-rays and there is nothing recent about those and therefore I have order some x-rays for it.  Initial Vital Signs:  Pulse/HCG Rate: 62  Temp: 98 F (36.7 C) Resp: 16 BP: 130/72 SpO2: 98 %  BMI: Estimated body mass index is 27.48 kg/m as calculated from the following:   Height as of this encounter: 6\' 2"  (1.88 m).   Weight as of this encounter: 214 lb (97.1 kg).  Risk Assessment: Allergies: Reviewed. He is allergic to statins and sulfa antibiotics.  Allergy Precautions: None required Coagulopathies: Reviewed. None identified.  Blood-thinner therapy: None at this time Active Infection(s): Reviewed. None identified. Mr. Iden is afebrile  Site Confirmation: Mr. Rizzi was asked to confirm the procedure and laterality before marking the site Procedure checklist: Completed Consent: Before the procedure and under the influence of no sedative(s), amnesic(s), or anxiolytics, the patient was informed of the treatment options, risks and possible complications. To fulfill our ethical and legal obligations, as recommended by the American Medical Association's Code of Ethics, I have informed the patient of my clinical impression; the nature and purpose of the treatment or procedure; the risks, benefits, and  possible complications of the intervention; the alternatives, including doing nothing; the risk(s) and  benefit(s) of the alternative treatment(s) or procedure(s); and the risk(s) and benefit(s) of doing nothing. The patient was provided information about the general risks and possible complications associated with the procedure. These may include, but are not limited to: failure to achieve desired goals, infection, bleeding, organ or nerve damage, allergic reactions, paralysis, and death. In addition, the patient was informed of those risks and complications associated to Spine-related procedures, such as failure to decrease pain; infection (i.e.: Meningitis, epidural or intraspinal abscess); bleeding (i.e.: epidural hematoma, subarachnoid hemorrhage, or any other type of intraspinal or peri-dural bleeding); organ or nerve damage (i.e.: Any type of peripheral nerve, nerve root, or spinal cord injury) with subsequent damage to sensory, motor, and/or autonomic systems, resulting in permanent pain, numbness, and/or weakness of one or several areas of the body; allergic reactions; (i.e.: anaphylactic reaction); and/or death. Furthermore, the patient was informed of those risks and complications associated with the medications. These include, but are not limited to: allergic reactions (i.e.: anaphylactic or anaphylactoid reaction(s)); adrenal axis suppression; blood sugar elevation that in diabetics may result in ketoacidosis or comma; water retention that in patients with history of congestive heart failure may result in shortness of breath, pulmonary edema, and decompensation with resultant heart failure; weight gain; swelling or edema; medication-induced neural toxicity; particulate matter embolism and blood vessel occlusion with resultant organ, and/or nervous system infarction; and/or aseptic necrosis of one or more joints. Finally, the patient was informed that Medicine is not an exact science; therefore, there is also the possibility of unforeseen or unpredictable risks and/or possible complications that may  result in a catastrophic outcome. The patient indicated having understood very clearly. We have given the patient no guarantees and we have made no promises. Enough time was given to the patient to ask questions, all of which were answered to the patient's satisfaction. [name] has indicated that he wanted to continue with the procedure. Attestation: I, the ordering provider, attest that I have discussed with the patient the benefits, risks, side-effects, alternatives, likelihood of achieving goals, and potential problems during recovery for the procedure that I have provided informed consent. Date  Time: 10/29/2018 11:20 AM  Pre-Procedure Preparation:  Monitoring: As per clinic protocol. Respiration, ETCO2, SpO2, BP, heart rate and rhythm monitor placed and checked for adequate function Safety Precautions: Patient was assessed for positional comfort and pressure points before starting the procedure. Time-out: I initiated and conducted the "Time-out" before starting the procedure, as per protocol. The patient was asked to participate by confirming the accuracy of the "Time Out" information. Verification of the correct person, site, and procedure were performed and confirmed by me, the nursing staff, and the patient. "Time-out" conducted as per Joint Commission's Universal Protocol (UP.01.01.01). Time: 1159  Description of Procedure:          Target Area: The interlaminar space, initially targeting the lower laminar border of the superior vertebral body. Approach: Paramedial approach. Area Prepped: Entire Posterior Lumbar Region Prepping solution: DuraPrep (Iodine Povacrylex [0.7% available iodine] and Isopropyl Alcohol, 74% w/w) Safety Precautions: Aspiration looking for blood return was conducted prior to all injections. At no point did we inject any substances, as a needle was being advanced. No attempts were made at seeking any paresthesias. Safe injection practices and needle disposal techniques  used. Medications properly checked for expiration dates. SDV (single dose vial) medications used. Description of the Procedure: Protocol guidelines were followed. The procedure  needle was introduced through the skin, ipsilateral to the reported pain, and advanced to the target area. Bone was contacted and the needle walked caudad, until the lamina was cleared. The epidural space was identified using "loss-of-resistance technique" with 2-3 ml of PF-NaCl (0.9% NSS), in a 5cc LOR glass syringe.  Vitals:   10/29/18 1119 10/29/18 1155 10/29/18 1205 10/29/18 1210  BP: 130/72 135/69 119/82 128/82  Pulse: 62 62 (!) 59 (!) 56  Resp: 16 13 12 13   Temp: 98 F (36.7 C)     SpO2: 98% 100% 97% 98%  Weight: 214 lb (97.1 kg)     Height: 6\' 2"  (1.88 m)       Start Time: 1159 hrs. End Time: 1210 hrs.  Materials:  Needle(s) Type: Epidural needle Gauge: 17G Length: 3.5-in Medication(s): Please see orders for medications and dosing details.  Imaging Guidance (Spinal):          Type of Imaging Technique: Fluoroscopy Guidance (Spinal) Indication(s): Assistance in needle guidance and placement for procedures requiring needle placement in or near specific anatomical locations not easily accessible without such assistance. Exposure Time: Please see nurses notes. Contrast: Before injecting any contrast, we confirmed that the patient did not have an allergy to iodine, shellfish, or radiological contrast. Once satisfactory needle placement was completed at the desired level, radiological contrast was injected. Contrast injected under live fluoroscopy. No contrast complications. See chart for type and volume of contrast used. Fluoroscopic Guidance: I was personally present during the use of fluoroscopy. "Tunnel Vision Technique" used to obtain the best possible view of the target area. Parallax error corrected before commencing the procedure. "Direction-depth-direction" technique used to introduce the needle under  continuous pulsed fluoroscopy. Once target was reached, antero-posterior, oblique, and lateral fluoroscopic projection used confirm needle placement in all planes. Images permanently stored in EMR. Interpretation: I personally interpreted the imaging intraoperatively. Adequate needle placement confirmed in multiple planes. Appropriate spread of contrast into desired area was observed. No evidence of afferent or efferent intravascular uptake. No intrathecal or subarachnoid spread observed. Permanent images saved into the patient's record.  Antibiotic Prophylaxis:   Anti-infectives (From admission, onward)   None     Indication(s): None identified  Post-operative Assessment:  Post-procedure Vital Signs:  Pulse/HCG Rate: (!) 56  Temp: 98 F (36.7 C) Resp: 13 BP: 128/82 SpO2: 98 %  EBL: None  Complications: No immediate post-treatment complications observed by team, or reported by patient.  Note: The patient tolerated the entire procedure well. A repeat set of vitals were taken after the procedure and the patient was kept under observation following institutional policy, for this type of procedure. Post-procedural neurological assessment was performed, showing return to baseline, prior to discharge. The patient was provided with post-procedure discharge instructions, including a section on how to identify potential problems. Should any problems arise concerning this procedure, the patient was given instructions to immediately contact us, at any time, without hesitation. In any case, we plan to contact the patient by telephone for a follow-up status report regarding this interventional procedure.  Comments:  No additional relevant information.  Plan of Care  Orders:  Orders Placed This Encounter  Procedures  . Lumbar Epidural Injection    Scheduling Instructions:     Procedure: Interlaminar LESI L3-4     Laterality: Left-sided     Sedation: Patient's choice     Timeframe:  Today     Order Specific Question:   Where will this procedure be performed?    Answer:  [facility]  . DG PAIN CLINIC C-ARM 1-60 MIN NO REPORT    Intraoperative interpretation by procedural physician at [facility].    Standing Status:   Standing    Number of Occurrences:   1    Order Specific Question:   Reason for exam:    Answer:   Assistance in needle guidance and placement for procedures requiring needle placement in or near specific anatomical locations not easily accessible without such assistance.  . DG Lumbar Spine Complete W/Bend    In addition to any acute findings, please report on degenerative changes related to: (Please specify level(s)) (1) ROM & instability (>74mm displacement) (2) Facet joint (Zygoapophyseal Joint) (3) DDD and/or IVDD (4) Pars defects (5) Previous surgical changes (Include description of hardware and hardware status, if present) (6) Presence and degree of spondylolisthesis, spondylosis, and/or spondyloarthropathies)  (7) Old Fractures (8) Demineralization (9) Additional bone pathology (10) Stenosis (Central, Lateral Recess, Foraminal) (11) If at all possible, please provide AP diameter (mm) of foraminal and/or central canal.    Standing Status:   Future    Standing Expiration Date:   01/28/2019    Order Specific Question:   Reason for Exam (SYMPTOM  OR DIAGNOSIS REQUIRED)    Answer:   Low back pain    Order Specific Question:   Preferred imaging location?    Answer:   [facility]    Order Specific Question:   Call Results- Best Contact Number?    Answer:   [phone] [facility])    Order Specific Question:   Radiology Contrast Protocol - do NOT remove file path    Answer:   [email or web address]  . DG HIP UNILAT W OR W/O PELVIS 2-3 VIEWS RIGHT    Standing Status:   Future    Standing Expiration Date:   10/29/2019    Scheduling Instructions:     Please describe any evidence of DJD, such  as joint narrowing, asymmetry, cysts, or any anomalies in bone density, production, or erosion.    Order Specific Question:   Reason for Exam (SYMPTOM  OR DIAGNOSIS REQUIRED)    Answer:   Right hip pain/arthralgia    Order Specific Question:   Preferred imaging location?    Answer:   [facility]    Order Specific Question:   Call Results- Best Contact Number?    Answer:   [phone] (Pain Clinic facility) (Dr. Laban Emperor)  . DG Si Joints    Standing Status:   Future    Standing Expiration Date:   01/28/2019    Order Specific Question:   Reason for Exam (SYMPTOM  OR DIAGNOSIS REQUIRED)    Answer:   Right hip pain/arthralgia    Order Specific Question:   Preferred imaging location?    Answer:   [facility]    Order Specific Question:   Call Results- Best Contact Number?    Answer:   [phone] [facility])  . Care order/instruction: Please confirm that the patient has stopped the Coumadin (Warfarin) X 5 days prior to procedure or surgery.    Please confirm that the patient has stopped the Coumadin (Warfarin) X 5 days prior to procedure or surgery.    Standing Status:   Standing    Number of Occurrences:   1  . Provider attestation of informed consent for procedure/surgical case    I, the ordering provider, attest that I have discussed with the patient the benefits, risks, side effects, alternatives, likelihood of achieving  goals and potential problems during recovery for the procedure that I have provided informed consent.    Standing Status:   Standing    Number of Occurrences:   1  . Informed Consent Details: Transcribe to consent form and obtain patient signature    Standing Status:   Standing    Number of Occurrences:   1    Order Specific Question:   Procedure    Answer:   Lumbar epidural steroid injection under fluoroscopic guidance. (See notes for level and laterality.)    Order Specific Question:   Surgeon    Answer:   Thermon Zulauf A. Laban EmperorNaveira, MD    Order  Specific Question:   Indication/Reason    Answer:   Low back pain and/or leg pain secondary to lumbar radiculitis/radiculopathy  . Bleeding precautions    Standing Status:   Standing    Number of Occurrences:   1   Chronic Opioid Analgesic:  Hydrocodone/APAP 10/325, 1 tab PO q 6 hrs (40 mg/day of hydrocodone) (not prescribed by our practice) MME: 40 mg/day.   Medications ordered for procedure: Meds ordered this encounter  Medications  . iohexol (OMNIPAQUE) 180 MG/ML injection 10 mL    Must be Myelogram-compatible. If not available, you may substitute with a water-soluble, non-ionic, hypoallergenic, myelogram-compatible radiological contrast medium.  Marland Kitchen. lidocaine (XYLOCAINE) 2 % (with pres) injection 400 mg  . sodium chloride flush (NS) 0.9 % injection 2 mL  . ropivacaine (PF) 2 mg/mL (0.2%) (NAROPIN) injection 2 mL  . triamcinolone acetonide (KENALOG-40) injection 40 mg   Medications administered: We administered iohexol, lidocaine, sodium chloride flush, ropivacaine (PF) 2 mg/mL (0.2%), and triamcinolone acetonide.  See the medical record for exact dosing, route, and time of administration.  Follow-up plan:   Return in about 2 weeks (around 11/12/2018) for (VV), E/M, (PP).       Considering:   Palliative LESIs    Palliative PRN treatment(s):   Diagnostic left L3-4 LESI #1  Palliative right L4-5 LESI #6  Palliative left L4-5 LESI #2  Palliative right L5-S1 LESI #3     Recent Visits Date Type Provider Dept  09/17/18 Office Visit Delano MetzNaveira, Doriann Zuch, MD [facility]  09/01/18 Procedure visit Delano MetzNaveira, Hosteen Kienast, MD [facility]  Showing recent visits within past 90 days and meeting all other requirements   Today's Visits Date Type Provider Dept  10/29/18 Procedure visit Delano MetzNaveira, Jetaun Colbath, MD [facility]  Showing today's visits and meeting all other requirements   Future Appointments Date Type Provider Dept  12/02/18 Appointment Delano MetzNaveira,  Kolt Mcwhirter, MD [facility]  Showing future appointments within next 90 days and meeting all other requirements   Disposition: Discharge home  Discharge Date & Time: 10/29/2018; 1219 hrs.   Primary Care Physician: Lindwood QuaHoffman, Byron, MD [facility] Note by: Oswaldo DoneFrancisco A Williemae Muriel, MD Date: 10/29/2018; Time: 12:21 PM  Disclaimer:  Medicine is not an Visual merchandiserexact science. The only guarantee in medicine is that nothing is guaranteed. It is important to note that the decision to proceed with this intervention was based on the information collected from the patient. The Data and conclusions were drawn from the patient's questionnaire, the interview, and the physical examination. Because the information was provided in large part by the patient, it cannot be guaranteed that it has not been purposely or unconsciously manipulated. Every effort has been made to obtain as much relevant data as possible for this evaluation. It is important to note that the conclusions that  lead to this procedure are derived in large part from the available data. Always take into account that the treatment will also be dependent on availability of resources and existing treatment guidelines, considered by other Pain Management Practitioners as being common knowledge and practice, at the time of the intervention. For Medico-Legal purposes, it is also important to point out that variation in procedural techniques and pharmacological choices are the acceptable norm. The indications, contraindications, technique, and results of the above procedure should only be interpreted and judged by a Board-Certified Interventional Pain Specialist with extensive familiarity and expertise in the same exact procedure and technique.

## 2018-10-29 NOTE — Patient Instructions (Signed)

## 2018-10-30 ENCOUNTER — Telehealth: Payer: Self-pay

## 2018-10-30 NOTE — Telephone Encounter (Signed)
Post procedure phone call.  Patient states that he is doing fine.

## 2018-12-01 ENCOUNTER — Telehealth: Payer: Self-pay | Admitting: *Deleted

## 2018-12-01 NOTE — Telephone Encounter (Signed)
Attempted to call for pre appointment review of meds/allergies. No answer, unable to leave a message.

## 2018-12-01 NOTE — Telephone Encounter (Signed)
Attempted to return the call, no reply

## 2018-12-02 ENCOUNTER — Other Ambulatory Visit: Payer: Self-pay

## 2018-12-02 ENCOUNTER — Ambulatory Visit: Payer: Medicare HMO | Attending: Pain Medicine | Admitting: Pain Medicine

## 2018-12-02 DIAGNOSIS — M25551 Pain in right hip: Secondary | ICD-10-CM

## 2018-12-02 DIAGNOSIS — M5136 Other intervertebral disc degeneration, lumbar region: Secondary | ICD-10-CM

## 2018-12-02 DIAGNOSIS — M5126 Other intervertebral disc displacement, lumbar region: Secondary | ICD-10-CM | POA: Diagnosis not present

## 2018-12-02 DIAGNOSIS — S32020S Wedge compression fracture of second lumbar vertebra, sequela: Secondary | ICD-10-CM | POA: Diagnosis not present

## 2018-12-02 DIAGNOSIS — G8929 Other chronic pain: Secondary | ICD-10-CM

## 2018-12-02 DIAGNOSIS — M5442 Lumbago with sciatica, left side: Secondary | ICD-10-CM | POA: Diagnosis not present

## 2018-12-02 DIAGNOSIS — M5441 Lumbago with sciatica, right side: Secondary | ICD-10-CM

## 2018-12-02 DIAGNOSIS — M1612 Unilateral primary osteoarthritis, left hip: Secondary | ICD-10-CM

## 2018-12-02 NOTE — Progress Notes (Signed)
Pain Management Virtual Encounter Note - Virtual Visit via Telephone Telehealth (real-time audio visits between healthcare provider and patient).   Patient's Phone No. & Preferred Pharmacy:  [phone] (home); There is no such number on file (mobile).; (Preferred) [phone] No e-mail address on record  [id number] - [place], [address] [place] [address] Phone: [phone] Fax: [phone]    Pre-screening note:  Our staff contacted Alan Stafford and offered him an "in person", "face-to-face" appointment versus a telephone encounter. He indicated preferring the telephone encounter, at this time.   Reason for Virtual Visit: COVID-19*  Social distancing based on CDC and AMA recommendations.   I contacted Alan Stafford on 12/02/2018 via telephone.      I clearly identified myself as Gaspar Cola, MD. I verified that I was speaking with the correct person using two identifiers (Name: Alan Stafford, and date of birth: 07/19/1945).  Advanced Informed Consent I sought verbal advanced consent from Alan Stafford for virtual visit interactions. I informed Alan Stafford of possible security and privacy concerns, risks, and limitations associated with providing "not-in-person" medical evaluation and management services. I also informed Alan Stafford of the availability of "in-person" appointments. Finally, I informed him that there would be a charge for the virtual visit and that he could be  personally, fully or partially, financially responsible for it. Alan Stafford expressed understanding and agreed to proceed.   Historic Elements   Alan Stafford is a 73 y.o. year old, male patient evaluated today after his last encounter by our practice on 12/01/2018. Alan Stafford  has a past medical history of A-fib [facility]), Abnormal heart rhythm, Arthritis, Asthma, CAP (community acquired pneumonia) (04/22/2016), Diabetes mellitus without complication  ([place]), Dysrhythmia, GERD (gastroesophageal reflux disease), Hemoptysis (04/22/2016), Hypercholesteremia, Hypertension, Hyperthyroidism, Kidney stones, and Sepsis ([place]) (04/22/2016). He also  has a past surgical history that includes Knee surgery (Right, 2015); Eye surgery (Right, 2015); Joint replacement (Right); and Cataract extraction w/PHACO (Left, 09/26/2016). Alan Stafford has a current medication list which includes the following prescription(s): albuterol, aspirin ec, fifty50 glucose meter 2.0, budesonide-formoterol, enalapril, escitalopram, flecainide, glucose blood, glucose blood, glucose blood, hydrocodone-acetaminophen, metformin, methimazole, metoprolol succinate, nitroglycerin, omeprazole, phenazopyridine, sildenafil, trazodone, and warfarin. He  reports that he has never smoked. He uses smokeless tobacco. He reports that he does not drink alcohol or use drugs. Alan Stafford is allergic to statins and sulfa antibiotics.   HPI  Today, he is being contacted for a post-procedure assessment.  The patient indicates that he is still enjoying approximately 60% relief of the pain from the lumbar epidural steroid injection.  I asked him where he still has pain and he indicated that in the area of the hip.  He also told me that he has not had the x-rays done.  I reminded him that I have an order for x-rays of his hip, sacroiliac joint, and lumbar spine and that the sooner he can have those done the sooner I can evaluate them and tell him if there is anything that we can do about it and whether or not there is anything wrong with it.  I told him to go ahead and have it done as soon as possible and to give me a call as soon as he has it done so that we can schedule an appointment to go over it.  He agreed with the plan and he indicated that he will try to have  them done this next week.  Post-Procedure Evaluation  Procedure: Palliative right-sided L4-5 LESI #5 under fluoroscopic guidance, no sedation Pre-procedure pain  level:  7/10 Post-procedure: 0/10 (100% relief)  Sedation: None.  Effectiveness during initial hour after procedure(Ultra-Short Term Relief): 50 %   Local anesthetic used: Long-acting (4-6 hours) Effectiveness: Defined as any analgesic benefit obtained secondary to the administration of local anesthetics. This carries significant diagnostic value as to the etiological location, or anatomical origin, of the pain. Duration of benefit is expected to coincide with the duration of the local anesthetic used.  Effectiveness during initial 4-6 hours after procedure(Short-Term Relief): 50 %   Long-term benefit: Defined as any relief past the pharmacologic duration of the local anesthetics.  Effectiveness past the initial 6 hours after procedure(Long-Term Relief): 60 %   Current benefits: Defined as benefit that persist at this time.   Analgesia:  >50% relief Function: Somewhat improved ROM: Somewhat improved  Pharmacotherapy Assessment  Analgesic: Hydrocodone/APAP 10/325, 1 tab PO q 6 hrs (40 mg/day of hydrocodone) (not prescribed by our practice) MME: 40 mg/day.   Monitoring: Pharmacotherapy: No side-effects or adverse reactions reported. [place] PMP: PDMP reviewed during this encounter.       Compliance: No problems identified. Effectiveness: Clinically acceptable. Plan: Refer to "POC".  UDS: No results found for: SUMMARY Laboratory Chemistry Profile (12 mo)  Renal: No results found for requested labs within last 8760 hours.  Lab Results  Component Value Date   GFRAA 41 (L) 05/23/2016   GFRNONAA 36 (L) 05/23/2016   Hepatic: No results found for requested labs within last 8760 hours. Lab Results  Component Value Date   AST 53 (H) 05/18/2016   ALT 37 05/18/2016   Other: No results found for requested labs within last 8760 hours. Note: Above Lab results reviewed.  Imaging  Last 90 days:  [facility] C-arm 1-60 Min No Report  Result Date: 10/29/2018 Fluoro was used, but no  Radiologist interpretation will be provided. Please refer to "NOTES" tab for provider progress note.  Assessment  There were no encounter diagnoses.  Plan of Care  I am having Alan Stafford maintain his albuterol, glucose blood, nitroGLYCERIN, Fifty50 Glucose Meter 2.0, escitalopram, glucose blood, aspirin EC, omeprazole, methimazole, flecainide, metoprolol succinate, metFORMIN, sildenafil, glucose blood, phenazopyridine, budesonide-formoterol, traZODone, warfarin, enalapril, and HYDROcodone-acetaminophen.  Pharmacotherapy (Medications Ordered): No orders of the defined types were placed in this encounter.  Orders:  No orders of the defined types were placed in this encounter.  Follow-up plan:   Return if symptoms worsen or fail to improve, for To review his x-rays.      Considering:   Palliative LESIs    Palliative PRN treatment(s):   Diagnostic left L3-4 LESI #1  Palliative right L4-5 LESI #6  Palliative left L4-5 LESI #2  Palliative right L5-S1 LESI #3     Recent Visits Date Type Provider Dept  10/29/18 Procedure visit Delano Metz, MD [facility]  09/17/18 Office Visit Delano Metz, MD [facility]  Showing recent visits within past 90 days and meeting all other requirements   Today's Visits Date Type Provider Dept  12/02/18 Office Visit Delano Metz, MD [facility]  Showing today's visits and meeting all other requirements   Future Appointments No visits were found meeting these conditions.  Showing future appointments within next 90 days and meeting all other requirements   I discussed the assessment and treatment plan with the patient. The patient was provided an opportunity to  ask questions and all were answered. The patient agreed with the plan and demonstrated an understanding of the instructions.  Patient advised to call back or seek an in-person evaluation if the symptoms or condition worsens.  Total duration of  non-face-to-face encounter: 13 minutes.  Note by: Oswaldo DoneFrancisco A Uri Turnbough, MD Date: 12/02/2018; Time: 11:39 AM  Note: This dictation was prepared with Dragon dictation. Any transcriptional errors that may result from this process are unintentional.  Disclaimer:  * Given the special circumstances of the COVID-19 pandemic, the federal government has announced that the Office for Civil Rights (OCR) will exercise its enforcement discretion and will not impose penalties on physicians using telehealth in the event of noncompliance with regulatory requirements under the [employer] Insurance Portability and Accountability Act (HIPAA) in connection with the good faith provision of telehealth during the COVID-19 national public health emergency. (AMA)

## 2018-12-08 ENCOUNTER — Ambulatory Visit
Admission: RE | Admit: 2018-12-08 | Discharge: 2018-12-08 | Disposition: A | Discharge status: Home / Self Care | Payer: Medicare HMO | Source: Ambulatory Visit | Attending: Pain Medicine | Admitting: Pain Medicine

## 2018-12-08 ENCOUNTER — Other Ambulatory Visit: Payer: Self-pay

## 2018-12-08 ENCOUNTER — Ambulatory Visit
Admission: RE | Admit: 2018-12-08 | Discharge: 2018-12-08 | Disposition: A | Discharge status: Home / Self Care | Payer: Medicare HMO | Source: Ambulatory Visit | Attending: Acute Care | Admitting: Acute Care

## 2018-12-08 DIAGNOSIS — M5441 Lumbago with sciatica, right side: Secondary | ICD-10-CM | POA: Diagnosis present

## 2018-12-08 DIAGNOSIS — M25551 Pain in right hip: Secondary | ICD-10-CM | POA: Insufficient documentation

## 2018-12-08 DIAGNOSIS — S32020S Wedge compression fracture of second lumbar vertebra, sequela: Secondary | ICD-10-CM | POA: Insufficient documentation

## 2018-12-08 DIAGNOSIS — M5137 Other intervertebral disc degeneration, lumbosacral region: Secondary | ICD-10-CM

## 2018-12-08 DIAGNOSIS — G8929 Other chronic pain: Secondary | ICD-10-CM | POA: Insufficient documentation

## 2018-12-08 DIAGNOSIS — M47816 Spondylosis without myelopathy or radiculopathy, lumbar region: Secondary | ICD-10-CM

## 2018-12-08 DIAGNOSIS — M48061 Spinal stenosis, lumbar region without neurogenic claudication: Secondary | ICD-10-CM | POA: Diagnosis present

## 2018-12-08 DIAGNOSIS — M4807 Spinal stenosis, lumbosacral region: Secondary | ICD-10-CM | POA: Diagnosis present

## 2018-12-08 DIAGNOSIS — M5442 Lumbago with sciatica, left side: Secondary | ICD-10-CM

## 2018-12-08 DIAGNOSIS — M533 Sacrococcygeal disorders, not elsewhere classified: Secondary | ICD-10-CM | POA: Diagnosis present

## 2019-03-03 ENCOUNTER — Other Ambulatory Visit: Payer: Self-pay | Admitting: Pain Medicine

## 2019-03-03 ENCOUNTER — Telehealth: Payer: Self-pay | Admitting: Pain Medicine

## 2019-03-03 DIAGNOSIS — M5137 Other intervertebral disc degeneration, lumbosacral region: Secondary | ICD-10-CM

## 2019-03-03 DIAGNOSIS — G8929 Other chronic pain: Secondary | ICD-10-CM

## 2019-03-03 DIAGNOSIS — M51379 Other intervertebral disc degeneration, lumbosacral region without mention of lumbar back pain or lower extremity pain: Secondary | ICD-10-CM

## 2019-03-03 DIAGNOSIS — S32020S Wedge compression fracture of second lumbar vertebra, sequela: Secondary | ICD-10-CM

## 2019-03-03 DIAGNOSIS — M51369 Other intervertebral disc degeneration, lumbar region without mention of lumbar back pain or lower extremity pain: Secondary | ICD-10-CM

## 2019-03-03 DIAGNOSIS — M47816 Spondylosis without myelopathy or radiculopathy, lumbar region: Secondary | ICD-10-CM

## 2019-03-03 DIAGNOSIS — M5136 Other intervertebral disc degeneration, lumbar region: Secondary | ICD-10-CM

## 2019-03-03 DIAGNOSIS — R937 Abnormal findings on diagnostic imaging of other parts of musculoskeletal system: Secondary | ICD-10-CM

## 2019-03-03 DIAGNOSIS — M48061 Spinal stenosis, lumbar region without neurogenic claudication: Secondary | ICD-10-CM

## 2019-03-03 DIAGNOSIS — M5416 Radiculopathy, lumbar region: Secondary | ICD-10-CM

## 2019-03-03 DIAGNOSIS — M5126 Other intervertebral disc displacement, lumbar region: Secondary | ICD-10-CM

## 2019-03-03 DIAGNOSIS — Z7901 Long term (current) use of anticoagulants: Secondary | ICD-10-CM

## 2019-03-03 NOTE — Telephone Encounter (Signed)
Patient is having severe hip pain and can hardly walk. Would like to get an injection into his hip. Says he has discussed his hip pain with Dr. Laban Emperor. Need order for injection for hip

## 2019-03-03 NOTE — Progress Notes (Signed)
The patient called requesting a right hip injection.  However, in reviewing all of the available data, it would seem that it is more likely than not that this is coming from the lumbar spine.  He has a history of multiple compression fractures in the thoracic and lumbar region as well as other pathology within the thoracolumbar canal.  The patient's last lumbar MRI within the [facility] system was done in 2013.  He had another one done at a [facility] facility in 2018, but his condition has been worsening and therefore I believe it is necessary to repeat this.  I will be tentatively putting him on the schedule for a diagnostic intra-articular hip injection, but if we could get the MRI done before then, that would help further clarify the etiology of the pain.

## 2019-03-03 NOTE — Telephone Encounter (Signed)
Called patient back to ask about hip pain.  Right hip is what is hurting and would like to have an injection.  Has never had injection in hip only in lower back.  I will forward to Dr Laban Emperor to get order.

## 2019-03-03 NOTE — Telephone Encounter (Signed)
Okay, I went ahead and put a note in the chart and I placed an order for a right intra-articular diagnostic hip injection, but I think that his pain may be coming from the lumbar spine. Within the [facility] system his last lumbar MRI was done in 2013. There is another one done at a [facility] facility around 2018. He has a history of multiple vertebral body fractures in the thoracolumbar region and his last x-ray of the hip was negative. Therefore I have also entered an order for a lumbar MRI which I would like for him to have done before he comes in to have his hip injection. If his insurance requires that he be preapproved for a lumbar epidural steroid injection, we may have to hold until we get the results of that MRI. Please go ahead and take care of all of this.   Alona Bene, this is a message from Dr Laban Emperor, can you take care of getting him approved/scheduled for MRI prior to his scheduling of intra-articular hip injection.Marland Kitchen

## 2019-03-09 ENCOUNTER — Telehealth: Payer: Self-pay | Admitting: *Deleted

## 2019-03-10 ENCOUNTER — Telehealth: Payer: Self-pay | Admitting: *Deleted

## 2019-03-24 ENCOUNTER — Other Ambulatory Visit: Payer: Self-pay

## 2019-03-24 ENCOUNTER — Ambulatory Visit
Admission: RE | Admit: 2019-03-24 | Discharge: 2019-03-24 | Disposition: A | Discharge status: Home / Self Care | Payer: Medicare Other | Source: Ambulatory Visit | Attending: Pain Medicine | Admitting: Pain Medicine

## 2019-03-24 DIAGNOSIS — M5126 Other intervertebral disc displacement, lumbar region: Secondary | ICD-10-CM | POA: Diagnosis present

## 2019-03-24 DIAGNOSIS — G8929 Other chronic pain: Secondary | ICD-10-CM

## 2019-03-24 DIAGNOSIS — M47816 Spondylosis without myelopathy or radiculopathy, lumbar region: Secondary | ICD-10-CM | POA: Diagnosis present

## 2019-03-24 DIAGNOSIS — M48061 Spinal stenosis, lumbar region without neurogenic claudication: Secondary | ICD-10-CM

## 2019-03-24 DIAGNOSIS — M5137 Other intervertebral disc degeneration, lumbosacral region: Secondary | ICD-10-CM

## 2019-03-24 DIAGNOSIS — M51379 Other intervertebral disc degeneration, lumbosacral region without mention of lumbar back pain or lower extremity pain: Secondary | ICD-10-CM

## 2019-03-24 DIAGNOSIS — M5416 Radiculopathy, lumbar region: Secondary | ICD-10-CM | POA: Insufficient documentation

## 2019-03-24 DIAGNOSIS — M5136 Other intervertebral disc degeneration, lumbar region: Secondary | ICD-10-CM | POA: Diagnosis not present

## 2019-03-24 DIAGNOSIS — R937 Abnormal findings on diagnostic imaging of other parts of musculoskeletal system: Secondary | ICD-10-CM | POA: Diagnosis present

## 2019-03-24 DIAGNOSIS — S32020S Wedge compression fracture of second lumbar vertebra, sequela: Secondary | ICD-10-CM

## 2019-03-24 DIAGNOSIS — M51369 Other intervertebral disc degeneration, lumbar region without mention of lumbar back pain or lower extremity pain: Secondary | ICD-10-CM

## 2019-03-28 NOTE — Progress Notes (Signed)
Patient: Alan Stafford  Service Category: E/M  Provider: Oswaldo Done, MD  DOB: 1945-07-06  DOS: 03/29/2019  Location: Office  MRN: [id number]  Setting: Ambulatory outpatient  Referring Provider: Lindwood Qua, MD  Type: Established Patient  Specialty: Interventional Pain Management  PCP: Lindwood Qua, MD  Location: Remote location  Delivery: TeleHealth     Virtual Encounter - Pain Management PROVIDER NOTE: Information contained herein reflects review and annotations entered in association with encounter. Interpretation of such information and data should be left to medically-trained personnel. Information provided to patient can be located elsewhere in the medical record under "Patient Instructions". Document created using STT-dictation technology, any transcriptional errors that may result from process are unintentional.    Contact & Pharmacy Preferred: [phone] Home: [phone] (home) Mobile: There is no such number on file (mobile). E-mail: [email or web address]  [id number] [place], [place] - [address] AT [facility] [address] [place] [address] Phone: [phone] Fax: [phone]   Pre-screening  Mr. Gordillo offered "in-person" vs "virtual" encounter. He indicated preferring virtual for this encounter.   Reason COVID-19*  Social distancing based on CDC and AMA recommendations.   I contacted Angelique Blonder on 03/29/2019 via telephone.      I clearly identified myself as Oswaldo Done, MD. I verified that I was speaking with the correct person using two identifiers (Name: DORSIE BURICH, and date of birth: 1945/07/06).  Consent I sought verbal advanced consent from Angelique Blonder for virtual visit interactions. I informed Mr. Saunders of possible security and privacy concerns, risks, and limitations associated with providing "not-in-person" medical evaluation and management services. I also informed Mr. Hollingsworth of the availability of  "in-person" appointments. Finally, I informed him that there would be a charge for the virtual visit and that he could be  personally, fully or partially, financially responsible for it. Mr. Lazcano expressed understanding and agreed to proceed.   Historic Elements   Mr. NISHAAN STANKE is a 74 y.o. year old, male patient evaluated today after his last encounter by our practice on 03/10/2019. Mr. Taras  has a past medical history of A-fib [facility]), Abnormal heart rhythm, Arthritis, Asthma, CAP (community acquired pneumonia) (04/22/2016), Diabetes mellitus without complication ([facility]), Dysrhythmia, GERD (gastroesophageal reflux disease), Hemoptysis (04/22/2016), Hypercholesteremia, Hypertension, Hyperthyroidism, Kidney stones, and Sepsis ([facility]) (04/22/2016). He also  has a past surgical history that includes Knee surgery (Right, 2015); Eye surgery (Right, 2015); Joint replacement (Right); and Cataract extraction w/PHACO (Left, 09/26/2016). Mr. Rahal has a current medication list which includes the following prescription(s): albuterol, aspirin ec, fifty50 glucose meter 2.0, budesonide-formoterol, enalapril, escitalopram, flecainide, glucose blood, glucose blood, glucose blood, hydrocodone-acetaminophen, metformin, methimazole, metoprolol succinate, nitroglycerin, omeprazole, sildenafil, trazodone, warfarin, and phenazopyridine. He  reports that he has never smoked. He uses smokeless tobacco. He reports that he does not drink alcohol or use drugs. Mr. Paskett is allergic to statins and sulfa antibiotics.   HPI  Today, he is being contacted for follow-up evaluation after MRI. Today I went over the results of the MRI in great detail trying to explain to him what each one of the findings would translate to in terms of symptoms.  Today he has indicated to me that his primary area of pain is that of the right hip and right knee.  The MRI would suggest that the patient has foraminal stenosis affecting the L4 and L3 nerve roots which  usually would give him pain in the area of the hip  and knee.  This stenosis seems to be both in the foraminal area as well as the lateral recess area.  The patient has a couple of disc protrusions that seem to be causing some of these problems.  He has bilateral lateral recess stenosis that could be irritating both L4 nerve roots and accounting for of the pain that goes all the way down into his feet which he describes as his third worst pain.  He also describes that this pain goes to the inner portion of his feet, following an L4 dermatomal distribution.  The patient's second worst pain is described to be that of the lower back on the right side but this pain is likely to be secondary to his facet joint arthropathy.  Today we talked about his alternatives including that of surgery but he has indicated that he would much rather try to treat this conservatively and therefore he will like for [place] to do some interventional treatments to see if we can improve some of his pain.  As to the pain going to his left hip and knee, but this does not seem to be is as bad as the pain on the right side.  In summary, today we have decided to schedule the patient for a right-sided L3 and L4 transforaminal epidural steroid injection for his right hip and knee pain.  If after this intervention those 2 areas improved, but he continues to have pain in the lower back, then we will go ahead and plan on doing a diagnostic lumbar facet block.  Hopefully this will also help the pain going down into his feet, but if it does not, then we will probably do a right-sided L4-5 LESI (interlaminar) to see if we can help that radicular pain.  This plan was discussed with the patient who indicated agreement with it.  Pharmacotherapy Assessment  Analgesic: Hydrocodone/APAP 10/325, 1 tab PO q 6 hrs (40 mg/day of hydrocodone) (not prescribed by our practice) MME: 40 mg/day.   Monitoring: Pharmacotherapy: No side-effects or adverse  reactions reported. [place] PMP: PDMP reviewed during this encounter.       Compliance: No problems identified. Effectiveness: Clinically acceptable. Plan: Refer to "POC".  UDS: No results found for: SUMMARY Laboratory Chemistry Profile (12 mo)  Renal: No results found for requested labs within last 8760 hours.  Lab Results  Component Value Date   GFRAA 41 (L) 05/23/2016   GFRNONAA 36 (L) 05/23/2016   Hepatic: No results found for requested labs within last 8760 hours. Lab Results  Component Value Date   AST 53 (H) 05/18/2016   ALT 37 05/18/2016   Other: No results found for requested labs within last 8760 hours.  Note: Above Lab results reviewed.  Imaging  MR Lumbar Spine w/o contrast CLINICAL DATA:  Low back and right hip and leg pain for 4-5 months.  EXAM: MRI LUMBAR SPINE WITHOUT CONTRAST  TECHNIQUE: Multiplanar, multisequence MR imaging of the lumbar spine was performed. No intravenous contrast was administered.  COMPARISON:  MRI 09/30/2016  FINDINGS: Segmentation: There are five lumbar type vertebral bodies. The last full intervertebral disc space is labeled L5-S1. This correlates with the prior MRI examination.  Alignment:  Normal overall alignment.  Vertebrae:  Normal marrow signal. No bone lesions or fractures.  Conus medullaris and cauda equina: Conus extends to the T12-L1 level. Conus and cauda equina appear normal.  Paraspinal and other soft tissues: No significant paraspinal or retroperitoneal findings.  Disc levels:  T12-L1: Disc desiccation and mild  diffuse annular bulge with slight flattening of the ventral thecal sac but no significant spinal, lateral recess or foraminal stenosis.  L1-2: Mild disc desiccation and degeneration with a slight bulging annulus and mild osteophytic ridging. Slight flattening of the ventral thecal sac but no focal disc protrusion, spinal or foraminal stenosis.  L2-3: Degenerative disc disease and facet disease.  There is a shallow broad-based left-sided disc protrusion with mild left lateral recess encroachment and mild left foraminal encroachment. No direct neural compression or displacement of the left L2 nerve root.  L3-4: Advanced degenerative disc disease with disc space narrowing, bulging annulus and osteophytic ridging. There is mild flattening of the ventral thecal sac and mild bilateral lateral recess stenosis. This appears relatively stable. There is also a far lateral disc protrusion on the left and osteophytic spurring but no significant spinal stenosis. Encroachment on the left L3 nerve root extraforaminally appears stable.  L4-5: Diffuse annular bulge and osteophytic ridging but no significant spinal stenosis. There is severe right-sided facet disease and a right foraminal disc protrusion contributing to moderate right foraminal stenosis. There is also a right-sided synovial cyst measuring 8 mm and contributing to encroachment on the right L4 nerve root. This was also present on the prior MRI.  L5-S1: Moderate bilateral facet disease but no disc protrusions, spinal or foraminal stenosis.  IMPRESSION: 1. Stable degenerative lumbar spondylosis with multilevel disc disease and facet disease. 2. Stable shallow broad-based left-sided disc protrusion at L2-3 with mild left lateral recess encroachment and mild left foraminal encroachment. 3. Stable far lateral disc protrusion on the left at L3-4 with associated osteophytic spurring contributing to mild bilateral lateral recess stenosis and left foraminal encroachment. 4. Persistent multifactorial right foraminal stenosis at L4-5.  Electronically Signed   By: Rudie Meyer M.D.   On: 03/24/2019 15:52  Assessment  The primary encounter diagnosis was Chronic pain syndrome. 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  Plan of Care  Problem-specific:  No problem-specific Assessment & Plan notes found for this encounter.  I am having [place] Boyett. Scheid maintain his albuterol, glucose blood, nitroGLYCERIN, Fifty50 Glucose Meter 2.0, escitalopram, glucose blood, aspirin EC, omeprazole, methimazole, flecainide, metoprolol succinate, metFORMIN, sildenafil, glucose blood, phenazopyridine, budesonide-formoterol, traZODone, warfarin, enalapril, and HYDROcodone-acetaminophen.  Pharmacotherapy (Medications Ordered): No orders of the defined types were placed in this encounter.  Orders:  Orders Placed This Encounter  Procedures  . Lumbar Transforaminal Epidural    Standing Status:   Future    Standing Expiration Date:   04/26/2019    Scheduling Instructions:     Side: Right-sided     Level: L3 & L4     Sedation: Patient's choice.     Timeframe: ASAP    Order Specific Question:   Where will this procedure be performed?    Answer:   [facility]   Follow-up plan:   Return for Procedure (w/ sedation): (R) L3 & L4 TFES #1.      Considering:   Diagnostic right sided L3 and L4 TFESI #1  Diagnostic bilateral lumbar facet blocks #1  Palliative right L4-5 LESI #6    Palliative PRN treatment(s):   Diagnostic left L3-4 LESI #1  Palliative  right L4-5 LESI #6  Palliative left L4-5 LESI #2  Palliative right L5-S1 LESI #3     Recent Visits No visits were found meeting these conditions.  Showing recent visits within past 90 days and meeting all other requirements   Today's Visits Date Type Provider Dept  03/29/19 Telemedicine Delano Metz, MD [facility]  Showing today's visits and meeting all other requirements   Future Appointments No visits  were found meeting these conditions.  Showing future appointments within next 90 days and meeting all other requirements   I discussed the assessment and treatment plan with the patient. The patient was provided an opportunity to ask questions and all were answered. The patient agreed with the plan and demonstrated an understanding of the instructions.  Patient advised to call back or seek an in-person evaluation if the symptoms or condition worsens.  Duration of encounter: 18 minutes.  Note by: Oswaldo Done, MD Date: 03/29/2019; Time: 2:24 PM

## 2019-03-29 ENCOUNTER — Encounter: Payer: Self-pay | Admitting: Pain Medicine

## 2019-03-29 ENCOUNTER — Ambulatory Visit: Payer: Medicare Other | Attending: Pain Medicine | Admitting: Pain Medicine

## 2019-03-29 ENCOUNTER — Other Ambulatory Visit: Payer: Self-pay

## 2019-03-29 DIAGNOSIS — G894 Chronic pain syndrome: Secondary | ICD-10-CM

## 2019-03-29 DIAGNOSIS — M545 Low back pain: Secondary | ICD-10-CM

## 2019-03-29 DIAGNOSIS — M25551 Pain in right hip: Secondary | ICD-10-CM

## 2019-03-29 DIAGNOSIS — M79604 Pain in right leg: Secondary | ICD-10-CM

## 2019-03-29 DIAGNOSIS — M25561 Pain in right knee: Secondary | ICD-10-CM

## 2019-03-29 DIAGNOSIS — M79605 Pain in left leg: Secondary | ICD-10-CM | POA: Diagnosis not present

## 2019-03-29 DIAGNOSIS — R937 Abnormal findings on diagnostic imaging of other parts of musculoskeletal system: Secondary | ICD-10-CM

## 2019-03-29 DIAGNOSIS — M48061 Spinal stenosis, lumbar region without neurogenic claudication: Secondary | ICD-10-CM

## 2019-03-29 DIAGNOSIS — M25562 Pain in left knee: Secondary | ICD-10-CM

## 2019-03-29 DIAGNOSIS — G8929 Other chronic pain: Secondary | ICD-10-CM

## 2019-03-29 DIAGNOSIS — M5441 Lumbago with sciatica, right side: Secondary | ICD-10-CM

## 2019-03-29 NOTE — Patient Instructions (Signed)

## 2019-04-06 ENCOUNTER — Other Ambulatory Visit: Payer: Self-pay

## 2019-04-06 ENCOUNTER — Encounter: Payer: Self-pay | Admitting: Pain Medicine

## 2019-04-06 ENCOUNTER — Ambulatory Visit (HOSPITAL_BASED_OUTPATIENT_CLINIC_OR_DEPARTMENT_OTHER): Payer: Medicare Other | Admitting: Pain Medicine

## 2019-04-06 ENCOUNTER — Ambulatory Visit
Admission: RE | Admit: 2019-04-06 | Discharge: 2019-04-06 | Disposition: A | Discharge status: Home / Self Care | Payer: Medicare Other | Source: Ambulatory Visit | Attending: Pain Medicine | Admitting: Pain Medicine

## 2019-04-06 VITALS — BP 132/73 | HR 63 | Temp 98.2°F | Resp 12 | Ht 74.0 in | Wt 214.0 lb

## 2019-04-06 DIAGNOSIS — M5137 Other intervertebral disc degeneration, lumbosacral region: Secondary | ICD-10-CM | POA: Insufficient documentation

## 2019-04-06 DIAGNOSIS — M5126 Other intervertebral disc displacement, lumbar region: Secondary | ICD-10-CM | POA: Insufficient documentation

## 2019-04-06 DIAGNOSIS — M79604 Pain in right leg: Secondary | ICD-10-CM | POA: Diagnosis not present

## 2019-04-06 DIAGNOSIS — M47816 Spondylosis without myelopathy or radiculopathy, lumbar region: Secondary | ICD-10-CM

## 2019-04-06 DIAGNOSIS — G8929 Other chronic pain: Secondary | ICD-10-CM | POA: Insufficient documentation

## 2019-04-06 DIAGNOSIS — M5416 Radiculopathy, lumbar region: Secondary | ICD-10-CM

## 2019-04-06 DIAGNOSIS — M51379 Other intervertebral disc degeneration, lumbosacral region without mention of lumbar back pain or lower extremity pain: Secondary | ICD-10-CM

## 2019-04-06 DIAGNOSIS — M48061 Spinal stenosis, lumbar region without neurogenic claudication: Secondary | ICD-10-CM

## 2019-04-06 DIAGNOSIS — M5442 Lumbago with sciatica, left side: Secondary | ICD-10-CM

## 2019-04-06 DIAGNOSIS — M5136 Other intervertebral disc degeneration, lumbar region: Secondary | ICD-10-CM

## 2019-04-06 DIAGNOSIS — M51369 Other intervertebral disc degeneration, lumbar region without mention of lumbar back pain or lower extremity pain: Secondary | ICD-10-CM

## 2019-04-06 DIAGNOSIS — M5441 Lumbago with sciatica, right side: Secondary | ICD-10-CM

## 2019-04-06 MED ORDER — LACTATED RINGERS IV SOLN: 1000.0000 mL | Freq: Once | INTRAVENOUS | Status: DC

## 2019-04-06 MED ORDER — DEXAMETHASONE SODIUM PHOSPHATE 10 MG/ML IJ SOLN
INTRAMUSCULAR | Status: AC
  Filled 2019-04-06: qty 1

## 2019-04-06 MED ORDER — FENTANYL CITRATE (PF) 100 MCG/2ML IJ SOLN: 25.0000 ug | INTRAMUSCULAR | Status: DC | PRN

## 2019-04-06 MED ORDER — ROPIVACAINE HCL 2 MG/ML IJ SOLN
2.0000 mL | Freq: Once | INTRAMUSCULAR | Status: AC
  Administered 2019-04-06: 2 mL via EPIDURAL
  Filled 2019-04-06: qty 10

## 2019-04-06 MED ORDER — MIDAZOLAM HCL 5 MG/5ML IJ SOLN: 1.0000 mg | INTRAMUSCULAR | Status: DC | PRN

## 2019-04-06 MED ORDER — IOHEXOL 180 MG/ML  SOLN
10.0000 mL | Freq: Once | INTRAMUSCULAR | Status: AC
  Administered 2019-04-06: 10 mL via EPIDURAL
  Filled 2019-04-06: qty 20

## 2019-04-06 MED ORDER — SODIUM CHLORIDE 0.9% FLUSH
2.0000 mL | Freq: Once | INTRAVENOUS | Status: AC
  Administered 2019-04-06: 2 mL

## 2019-04-06 MED ORDER — LIDOCAINE HCL 2 % IJ SOLN
20.0000 mL | Freq: Once | INTRAMUSCULAR | Status: AC
  Administered 2019-04-06: 400 mg
  Filled 2019-04-06: qty 40

## 2019-04-06 MED ORDER — DEXAMETHASONE SODIUM PHOSPHATE 10 MG/ML IJ SOLN
20.0000 mg | Freq: Once | INTRAMUSCULAR | Status: AC
  Administered 2019-04-06: 20 mg
  Filled 2019-04-06: qty 2

## 2019-04-06 NOTE — Patient Instructions (Addendum)
____________________________________________________________________________________________  Post-Procedure Discharge Instructions  Instructions:  Apply ice:   Purpose: This will minimize any swelling and discomfort after procedure.   When: Day of procedure, as soon as you get home.  How: Fill a plastic sandwich bag with crushed ice. Cover it with a small towel and apply to injection site.  How long: (15 min on, 15 min off) Apply for 15 minutes then remove x 15 minutes.  Repeat sequence on day of procedure, until you go to bed.  Apply heat:   Purpose: To treat any soreness and discomfort from the procedure.  When: Starting the next day after the procedure.  How: Apply heat to procedure site starting the day following the procedure.  How long: May continue to repeat daily, until discomfort goes away.  Food intake: Start with clear liquids (like water) and advance to regular food, as tolerated.   Physical activities: Keep activities to a minimum for the first 8 hours after the procedure. After that, then as tolerated.  Driving: If you have received any sedation, be responsible and do not drive. You are not allowed to drive for 24 hours after having sedation.  Blood thinner: (Applies only to those taking blood thinners) You may restart your blood thinner 6 hours after your procedure.  Insulin: (Applies only to Diabetic patients taking insulin) As soon as you can eat, you may resume your normal dosing schedule.  Infection prevention: Keep procedure site clean and dry. Shower daily and clean area with soap and water.  Post-procedure Pain Diary: Extremely important that this be done correctly and accurately. Recorded information will be used to determine the next step in treatment. For the purpose of accuracy, follow these rules:  Evaluate only the area treated. Do not report or include pain from an untreated area. For the purpose of this evaluation, ignore all other areas of pain,  except for the treated area.  After your procedure, avoid taking a long nap and attempting to complete the pain diary after you wake up. Instead, set your alarm clock to go off every hour, on the hour, for the initial 8 hours after the procedure. Document the duration of the numbing medicine, and the relief you are getting from it.  Do not go to sleep and attempt to complete it later. It will not be accurate. If you received sedation, it is likely that you were given a medication that may cause amnesia. Because of this, completing the diary at a later time may cause the information to be inaccurate. This information is needed to plan your care.  Follow-up appointment: Keep your post-procedure follow-up evaluation appointment after the procedure (usually 2 weeks for most procedures, 6 weeks for radiofrequencies). DO NOT FORGET to bring you pain diary with you.   Expect: (What should I expect to see with my procedure?)  From numbing medicine (AKA: Local Anesthetics): Numbness or decrease in pain. You may also experience some weakness, which if present, could last for the duration of the local anesthetic.  Onset: Full effect within 15 minutes of injected.  Duration: It will depend on the type of local anesthetic used. On the average, 1 to 8 hours.   From steroids (Applies only if steroids were used): Decrease in swelling or inflammation. Once inflammation is improved, relief of the pain will follow.  Onset of benefits: Depends on the amount of swelling present. The more swelling, the longer it will take for the benefits to be seen. In some cases, up to 10 days.    Duration: Steroids will stay in the system x 2 weeks. Duration of benefits will depend on multiple posibilities including persistent irritating factors.  Side-effects: If present, they may typically last 2 weeks (the duration of the steroids).  Frequent: Cramps (if they occur, drink Gatorade and take over-the-counter Magnesium 450-500 mg  once to twice a day); water retention with temporary weight gain; increases in blood sugar; decreased immune system response; increased appetite.  Occasional: Facial flushing (red, warm cheeks); mood swings; menstrual changes.  Uncommon: Long-term decrease or suppression of natural hormones; bone thinning. (These are more common with higher doses or more frequent use. This is why we prefer that our patients avoid having any injection therapies in other practices.)   Very Rare: Severe mood changes; psychosis; aseptic necrosis.  From procedure: Some discomfort is to be expected once the numbing medicine wears off. This should be minimal if ice and heat are applied as instructed.  Call if: (When should I call?)  You experience numbness and weakness that gets worse with time, as opposed to wearing off.  New onset bowel or bladder incontinence. (Applies only to procedures done in the spine)  Emergency Numbers:  Durning business hours (Monday - Thursday, 8:00 AM - 4:00 PM) (Friday, 9:00 AM - 12:00 Noon): [phone]  After hours: [phone]  NOTE: If you are having a problem and are unable connect with, or to talk to a provider, then go to your nearest urgent care or emergency department. If the problem is serious and urgent, please call 911. ____________________________________________________________________________________________   Selective Nerve Root Block Patient Information  Description: Specific nerve roots exit the spinal canal and these nerves can be compressed and inflamed by a bulging disc and bone spurs.  By injecting steroids on the nerve root, we can potentially decrease the inflammation surrounding these nerves, which often leads to decreased pain.  Also, by injecting local anesthesia on the nerve root, this can provide [place] helpful information to give to your referring doctor if it decreases your pain.  Selective nerve root blocks can be done along the spine from the  neck to the low back depending on the location of your pain.   After numbing the skin with local anesthesia, a small needle is passed to the nerve root and the position of the needle is verified using x-ray pictures.  After the needle is in correct position, we then deposit the medication.  You may experience a pressure sensation while this is being done.  The entire block usually lasts less than 15 minutes.  Conditions that may be treated with selective nerve root blocks:  Low back and leg pain  Spinal stenosis  Diagnostic block prior to potential surgery  Neck and arm pain  Post laminectomy syndrome  Preparation for the injection:  1. Do not eat any solid food or dairy products within 8 hours of your appointment. 2. You may drink clear liquids up to 3 hours before an appointment.  Clear liquids include water, black coffee, juice or soda.  No milk or cream please. 3. You may take your regular medications, including pain medications, with a sip of water before your appointment.  Diabetics should hold regular insulin (if taken separately) and take 1/2 normal NPH dose the morning of the procedure.  Carry some sugar containing items with you to your appointment. 4. A driver must accompany you and be prepared to drive you home after your procedure. 5. Bring all your current medications with you. 6. An IV  may be inserted and sedation may be given at the discretion of the physician. 7. A blood pressure cuff, EKG, and other monitors will often be applied during the procedure.  Some patients may need to have extra oxygen administered for a short period. 8. You will be asked to provide medical information, including allergies, prior to the procedure.  We must know immediately if you are taking blood  Thinners (like Coumadin) or if you are allergic to IV iodine contrast (dye).  Possible side-effects: All are usually temporary  Bleeding from needle site  Light headedness  Numbness and  tingling  Decreased blood pressure  Weakness in arms/legs  Pressure sensation in back/neck  Pain at injection site (several days)  Possible complications: All are extremely rare  Infection  Nerve injury  Spinal headache (a headache wore with upright position)  Call if you experience:  Fever/chills associated with headache or increased back/neck pain  Headache worsened by an upright position  New onset weakness or numbness of an extremity below the injection site  Hives or difficulty breathing (go to the emergency room)  Inflammation or drainage at the injection site(s)  Severe back/neck pain greater than usual  New symptoms which are concerning to you  Please note:  Although the local anesthetic injected can often make your back or neck feel good for several hours after the injection the pain will likely return.  It takes 3-5 days for steroids to work on the nerve root. You may not notice any pain relief for at least one week.  If effective, we will often do a series of 3 injections spaced 3-6 weeks apart to maximally decrease your pain.    If you have any questions, please call [phone] [facility] Discharge Inst[facility]  General Discharge Instructions :  If you need to reach your doctor call: Monday-Friday 8:00 am - 4:00 pm at [phone] or toll free [phone].  After clinic hours [phone] to have operator reach doctor.  Bring all of your medication bottles to all your appointments in the pain clinic.  To cancel or reschedule your appointment with Pain Management please remember to call 24 hours in advance to avoid a fee.  Refer to the educational materials which you have been given on: General Risks, I had my Procedure. Discharge Instructions, Post Sedation.  Post Procedure Instructions:  The drugs you were given will stay in your system until tomorrow, so for the next 24 hours you should not  drive, make any legal decisions or drink any alcoholic beverages.  You may eat anything you prefer, but it is better to start with liquids then soups and crackers, and gradually work up to solid foods.  Please notify your doctor immediately if you have any unusual bleeding, trouble breathing or pain that is not related to your normal pain.  Depending on the type of procedure that was done, some parts of your body may feel week and/or numb.  This usually clears up by tonight or the next day.  Walk with the use of an assistive device or accompanied by an adult for the 24 hours.  You may use ice on the affected area for the first 24 hours.  Put ice in a Ziploc bag and cover with a towel and place against area 15 minutes on 15 minutes off.  You may switch to heat after 24 hours.

## 2019-04-06 NOTE — Progress Notes (Signed)
PROVIDER NOTE: Information contained herein reflects review and annotations entered in association with encounter. Interpretation of such information and data should be left to medically-trained personnel. Information provided to patient can be located elsewhere in the medical record under "Patient Instructions". Document created using STT-dictation technology, any transcriptional errors that may result from process are unintentional.    Patient: Alan Stafford  Service Category: Procedure  Provider: Gaspar Cola, MD  DOB: [birth year]  DOS: 04/06/2019  Location: [facility]  MRN: [id number]  Setting: Ambulatory - outpatient  Referring Provider: Raelene Bott, MD  Type: Established Patient  Specialty: Interventional Pain Management  PCP: Raelene Bott, MD   Primary Reason for Visit: Interventional Pain Management Treatment. CC: Back Pain (lower right) and Hip Pain (right)  Procedure:          Anesthesia, Analgesia, Anxiolysis:  Type: Trans-Foraminal Epidural Steroid Injection #1  Purpose: Diagnostic/Therapeutic Region: Posterolateral Lumbosacral Target Area: The 6 o'clock position under the pedicle, on the affected side. Approach: Posterior Percutaneous Paravertebral approach. Level: L3 & L4 Level Laterality: Right-Sided Paravertebral  Type: Local Anesthesia Indication(s): Analgesia         Route: Infiltration (/IM) IV Access: Declined Sedation: Declined  Local Anesthetic: Lidocaine 1-2%  Position: Prone   Indications: 1. DDD (degenerative disc disease), lumbosacral   2. Bulging lumbar disc (T12-L1, L1-2, L2-3, and L4-5)   3. Chronic lower extremity pain (Primary Source of Pain) (Bilateral) (R>L)   4. Chronic lumbar radicular pain (Right)   5. 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 Lumbar spondylosis   7. Chronic low back pain (Secondary source of pain) (Bilateral) (R>L)    Pain Score: Pre-procedure: 6  /10 Post-procedure: 6 /10   Pre-op Assessment:  Alan Stafford is a 74 y.o. (year old), male patient, seen today for interventional treatment. He  has a past surgical history that includes Knee surgery (Right, 2015); Eye surgery (Right, 2015); Joint replacement (Right); and Cataract extraction w/PHACO (Left, 09/26/2016). Alan Stafford has a current medication list which includes the following prescription(s): albuterol, aspirin ec, fifty50 glucose meter 2.0, budesonide-formoterol, enalapril, escitalopram, flecainide, glucose blood, glucose blood, glucose blood, hydrocodone-acetaminophen, metformin, methimazole, metoprolol succinate, nitroglycerin, omeprazole, sildenafil, trazodone, warfarin, and phenazopyridine. His primarily concern today is the Back Pain (lower right) and Hip Pain (right)  Initial Vital Signs:  Pulse/HCG Rate: 63ECG Heart Rate: 63 Temp: 98.2 F (36.8 C) Resp: 16 BP: 130/77 SpO2: 98 %  BMI: Estimated body mass index is 27.48 kg/m as calculated from the following:   Height as of this encounter: 6\' 2"  (1.88 m).   Weight as of this encounter: 214 lb (97.1 kg).  Risk Assessment: Allergies: Reviewed. He is allergic to statins and sulfa antibiotics.  Allergy Precautions: None required Coagulopathies: Reviewed. None identified.  Blood-thinner therapy: None at this time Active Infection(s): Reviewed. None identified. Alan Stafford is afebrile  Site Confirmation: Alan Stafford was asked to confirm the procedure and laterality before marking the site Procedure checklist: Completed Consent: Before the procedure and under the influence of no sedative(s), amnesic(s), or anxiolytics, the patient was informed of the treatment options, risks and possible complications. To fulfill our ethical and legal obligations, as recommended by the American Medical Association's Code of Ethics, I have informed the patient of my clinical impression; the nature and purpose of the treatment or procedure; the risks,  benefits, and possible complications of the intervention; the alternatives, including doing nothing; the risk(s) and benefit(s) of the alternative treatment(s)  or procedure(s); and the risk(s) and benefit(s) of doing nothing. The patient was provided information about the general risks and possible complications associated with the procedure. These may include, but are not limited to: failure to achieve desired goals, infection, bleeding, organ or nerve damage, allergic reactions, paralysis, and death. In addition, the patient was informed of those risks and complications associated to Spine-related procedures, such as failure to decrease pain; infection (i.e.: Meningitis, epidural or intraspinal abscess); bleeding (i.e.: epidural hematoma, subarachnoid hemorrhage, or any other type of intraspinal or peri-dural bleeding); organ or nerve damage (i.e.: Any type of peripheral nerve, nerve root, or spinal cord injury) with subsequent damage to sensory, motor, and/or autonomic systems, resulting in permanent pain, numbness, and/or weakness of one or several areas of the body; allergic reactions; (i.e.: anaphylactic reaction); and/or death. Furthermore, the patient was informed of those risks and complications associated with the medications. These include, but are not limited to: allergic reactions (i.e.: anaphylactic or anaphylactoid reaction(s)); adrenal axis suppression; blood sugar elevation that in diabetics may result in ketoacidosis or comma; water retention that in patients with history of congestive heart failure may result in shortness of breath, pulmonary edema, and decompensation with resultant heart failure; weight gain; swelling or edema; medication-induced neural toxicity; particulate matter embolism and blood vessel occlusion with resultant organ, and/or nervous system infarction; and/or aseptic necrosis of one or more joints. Finally, the patient was informed that Medicine is not an exact science;  therefore, there is also the possibility of unforeseen or unpredictable risks and/or possible complications that may result in a catastrophic outcome. The patient indicated having understood very clearly. We have given the patient no guarantees and we have made no promises. Enough time was given to the patient to ask questions, all of which were answered to the patient's satisfaction. Mr. Frane has indicated that he wanted to continue with the procedure. Attestation: I, the ordering provider, attest that I have discussed with the patient the benefits, risks, side-effects, alternatives, likelihood of achieving goals, and potential problems during recovery for the procedure that I have provided informed consent. Date  Time: 04/06/2019 12:48 PM  Pre-Procedure Preparation:  Monitoring: As per clinic protocol. Respiration, ETCO2, SpO2, BP, heart rate and rhythm monitor placed and checked for adequate function Safety Precautions: Patient was assessed for positional comfort and pressure points before starting the procedure. Time-out: I initiated and conducted the "Time-out" before starting the procedure, as per protocol. The patient was asked to participate by confirming the accuracy of the "Time Out" information. Verification of the correct person, site, and procedure were performed and confirmed by me, the nursing staff, and the patient. "Time-out" conducted as per Joint Commission's Universal Protocol (UP.01.01.01). Time: 1307  Description of Procedure:          Area Prepped: Entire Posterior Lumbosacral Area Prepping solution: DuraPrep (Iodine Povacrylex [0.7% available iodine] and Isopropyl Alcohol, 74% w/w) Safety Precautions: Aspiration looking for blood return was conducted prior to all injections. At no point did we inject any substances, as a needle was being advanced. No attempts were made at seeking any paresthesias. Safe injection practices and needle disposal techniques used. Medications properly  checked for expiration dates. SDV (single dose vial) medications used. Description of the Procedure: Protocol guidelines were followed. The patient was placed in position over the procedure table. The target area was identified and the area prepped in the usual manner. Skin & deeper tissues infiltrated with local anesthetic. Appropriate amount of time allowed to pass for local  anesthetics to take effect. The procedure needles were then advanced to the target area. Proper needle placement secured. Negative aspiration confirmed. Solution injected in intermittent fashion, asking for systemic symptoms every 0.5cc of injectate. The needles were then removed and the area cleansed, making sure to leave some of the prepping solution back to take advantage of its long term bactericidal properties.  Vitals:   04/06/19 1242 04/06/19 1307 04/06/19 1312 04/06/19 1317  BP: 130/77 134/75 140/76 132/73  Pulse: 63     Resp: 16 15 17 12   Temp: 98.2 F (36.8 C)     TempSrc: Temporal     SpO2: 98% 98% 99% 99%  Weight: 214 lb (97.1 kg)     Height: 6\' 2"  (1.88 m)       Start Time: 1307 hrs. End Time: 1317 hrs.  Materials:  Needle(s) Type: Spinal Needle Gauge: 22G Length: 3.5-in Medication(s): Please see orders for medications and dosing details.  Imaging Guidance (Spinal):          Type of Imaging Technique: Fluoroscopy Guidance (Spinal) Indication(s): Assistance in needle guidance and placement for procedures requiring needle placement in or near specific anatomical locations not easily accessible without such assistance. Exposure Time: Please see nurses notes. Contrast: Before injecting any contrast, we confirmed that the patient did not have an allergy to iodine, shellfish, or radiological contrast. Once satisfactory needle placement was completed at the desired level, radiological contrast was injected. Contrast injected under live fluoroscopy. No contrast complications. See chart for type and volume of  contrast used. Fluoroscopic Guidance: I was personally present during the use of fluoroscopy. "Tunnel Vision Technique" used to obtain the best possible view of the target area. Parallax error corrected before commencing the procedure. "Direction-depth-direction" technique used to introduce the needle under continuous pulsed fluoroscopy. Once target was reached, antero-posterior, oblique, and lateral fluoroscopic projection used confirm needle placement in all planes. Images permanently stored in EMR. Interpretation: I personally interpreted the imaging intraoperatively. Adequate needle placement confirmed in multiple planes. Appropriate spread of contrast into desired area was observed. No evidence of afferent or efferent intravascular uptake. No intrathecal or subarachnoid spread observed. Permanent images saved into the patient's record.  Antibiotic Prophylaxis:   Anti-infectives (From admission, onward)   None     Indication(s): None identified  Post-operative Assessment:  Post-procedure Vital Signs:  Pulse/HCG Rate: 6362 Temp: 98.2 F (36.8 C) Resp: 12 BP: 132/73 SpO2: 99 %  EBL: None  Complications: No immediate post-treatment complications observed by team, or reported by patient.  Note: The patient tolerated the entire procedure well. A repeat set of vitals were taken after the procedure and the patient was kept under observation following institutional policy, for this type of procedure. Post-procedural neurological assessment was performed, showing return to baseline, prior to discharge. The patient was provided with post-procedure discharge instructions, including a section on how to identify potential problems. Should any problems arise concerning this procedure, the patient was given instructions to immediately contact us, at any time, without hesitation. In any case, we plan to contact the patient by telephone for a follow-up status report regarding this interventional  procedure.  Comments:  No additional relevant information.  Plan of Care  Orders:  Orders Placed This Encounter  Procedures  . Lumbar Transforaminal Epidural    Scheduling Instructions:     Side: Right-sided     Level: L3 & L4     Sedation: With Sedation.     Timeframe: Today    Order Specific Question:  Where will this procedure be performed?    Answer:   [facility]  . DG PA[facility] C-ARM 1-60 MIN NO REPORT    Intraoperative interpretation by procedural physician at [facility].    Standing Status:   Standing    Number of Occurrences:   1    Order Specific Question:   Reason for exam:    Answer:   Assistance in needle guidance and placement for procedures requiring needle placement in or near specific anatomical locations not easily accessible without such assistance.  . Informed Consent Details: Physician/Practitioner Attestation; Transcribe to consent form and obtain patient signature    Provider Attestation: I, Jhovanny Guinta A. Laban Emperor, MD, (Pain Management Specialist), the physician/practitioner, attest that I have discussed with the patient the benefits, risks, side effects, alternatives, likelihood of achieving goals and potential problems during recovery for the procedure that I have provided informed consent.    Scheduling Instructions:     Procedure: Diagnostic lumbar transforaminal epidural steroid injection under fluoroscopic guidance. (See notes for level and laterality.)     Indication/Reason: Lumbar radiculopathy/radiculitis associated with lumbar stenosis     Note: Always confirm laterality of pain with Mr. Tews, before procedure.     Transcribe to consent form and obtain patient signature.  . Care order/instruction: Please confirm that the patient has stopped the Coumadin (Warfarin) X 5 days prior to procedure or surgery.    Please confirm that the patient has stopped the Coumadin (Warfarin) X 5 days prior to procedure or surgery.    Standing  Status:   Standing    Number of Occurrences:   1  . Provide equipment / supplies at bedside    Equipment required: Single use, disposable, "Block Tray"    Standing Status:   Standing    Number of Occurrences:   1    Order Specific Question:   Specify    Answer:   Block Tray  . Bleeding precautions    Standing Status:   Standing    Number of Occurrences:   1   Chronic Opioid Analgesic:  Hydrocodone/APAP 10/325, 1 tab PO q 6 hrs (40 mg/day of hydrocodone) (not prescribed by our practice) MME: 40 mg/day.   Medications ordered for procedure: Meds ordered this encounter  Medications  . iohexol (OMNIPAQUE) 180 MG/ML injection 10 mL    Must be Myelogram-compatible. If not available, you may substitute with a water-soluble, non-ionic, hypoallergenic, myelogram-compatible radiological contrast medium.  Marland Kitchen lidocaine (XYLOCAINE) 2 % (with pres) injection 400 mg  . DISCONTD: lactated ringers infusion 1,000 mL  . DISCONTD: midazolam (VERSED) 5 MG/5ML injection 1-2 mg    Make sure Flumazenil is available in the pyxis when using this medication. If oversedation occurs, administer 0.2 mg IV over 15 sec. If after 45 sec no response, administer 0.2 mg again over 1 min; may repeat at 1 min intervals; not to exceed 4 doses (1 mg)  . DISCONTD: fentaNYL (SUBLIMAZE) injection 25-50 mcg    Make sure Narcan is available in the pyxis when using this medication. In the event of respiratory depression (RR< 8/min): Titrate NARCAN (naloxone) in increments of 0.1 to 0.2 mg IV at 2-3 minute intervals, until desired degree of reversal.  . sodium chloride flush (NS) 0.9 % injection 2 mL  . ropivacaine (PF) 2 mg/mL (0.2%) (NAROPIN) injection 2 mL  . dexamethasone (DECADRON) injection 20 mg   Medications administered: We administered iohexol, lidocaine, sodium chloride flush, ropivacaine (PF) 2 mg/mL (0.2%), and dexamethasone.  See the medical record for exact dosing, route, and time of administration.  Follow-up  plan:   Return in about 2 weeks (around 04/20/2019) for (VV), (PP).       Considering:   Diagnostic right sided L3 and L4 TFESI #1  Diagnostic bilateral lumbar facet blocks #1  Palliative right L4-5 LESI #6    Palliative PRN treatment(s):   Diagnostic left L3-4 LESI #1  Palliative right L4-5 LESI #6  Palliative left L4-5 LESI #2  Palliative right L5-S1 LESI #3      Recent Visits Date Type Provider Dept  03/29/19 Telemedicine Delano Metz, MD [facility]  Showing recent visits within past 90 days and meeting all other requirements   Today's Visits Date Type Provider Dept  04/06/19 Procedure visit Delano Metz, MD [facility]  Showing today's visits and meeting all other requirements   Future Appointments Date Type Provider Dept  04/21/19 Appointment Delano Metz, MD [facility]  Showing future appointments within next 90 days and meeting all other requirements   Disposition: Discharge home  Discharge (Date  Time): 04/06/2019; 1330 hrs.   Primary Care Physician: Lindwood Qua, MD Location: [facility] Note by: Oswaldo Done, MD Date: 04/06/2019; Time: 1:37 PM  Disclaimer:  Medicine is not an Visual merchandiser. The only guarantee in medicine is that nothing is guaranteed. It is important to note that the decision to proceed with this intervention was based on the information collected from the patient. The Data and conclusions were drawn from the patient's questionnaire, the interview, and the physical examination. Because the information was provided in large part by the patient, it cannot be guaranteed that it has not been purposely or unconsciously manipulated. Every effort has been made to obtain as much relevant data as possible for this evaluation. It is important to note that the conclusions that lead to this procedure are derived in large part from the available data. Always take into account that  the treatment will also be dependent on availability of resources and existing treatment guidelines, considered by other Pain Management Practitioners as being common knowledge and practice, at the time of the intervention. For Medico-Legal purposes, it is also important to point out that variation in procedural techniques and pharmacological choices are the acceptable norm. The indications, contraindications, technique, and results of the above procedure should only be interpreted and judged by a Board-Certified Interventional Pain Specialist with extensive familiarity and expertise in the same exact procedure and technique.

## 2019-04-06 NOTE — Progress Notes (Signed)
Safety precautions to be maintained throughout the outpatient stay will include: orient to surroundings, keep bed in low position, maintain call bell within reach at all times, provide assistance with transfer out of bed and ambulation.  

## 2019-04-07 ENCOUNTER — Telehealth: Payer: Self-pay | Admitting: *Deleted

## 2019-04-07 NOTE — Telephone Encounter (Signed)
Spoke with patient re; procedure on yesterday.  Denies any questions or concerns.  Did use ice on yesterday and will switch to heat today.

## 2019-04-20 NOTE — Progress Notes (Signed)
Patient: Alan Stafford  Service Category: E/M  Provider: Gaspar Cola, MD  DOB: Aug 13, 1945  DOS: 04/21/2019  Location: Office  MRN: [id number]  Setting: Ambulatory outpatient  Referring Provider: Raelene Bott, MD  Type: Established Patient  Specialty: Interventional Pain Management  PCP: Alan Bott, MD  Location: Remote location  Delivery: TeleHealth     Virtual Encounter - Pain Management PROVIDER NOTE: Information contained herein reflects review and annotations entered in association with encounter. Interpretation of such information and data should be left to medically-trained personnel. Information provided to patient can be located elsewhere in the medical record under "Patient Instructions". Document created using STT-dictation technology, any transcriptional errors that may result from process are unintentional.    Contact & Pharmacy Preferred: [phone] Home: [phone] (home) Mobile: There is no such number on file (mobile). E-mail: [email or web address]  [id number] [id number]: [phone]: [phone]  Pre-screening  Mr. DLevingstonoffered "in-person" vs "virtual" encounter. He indicated preferring virtual for this encounter.   Reason COVID-19*  Social distancing based on CDC and AMA recommendations.   I contacted Alan Abrahamon 04/21/2019 via telephone.      I clearly identified myself as FGaspar Cola MD. I verified that I was speaking with the correct person using two identifiers (Name: Alan Stafford and date of birth: [birth year].  Consent I sought verbal advanced consent from Alan Abrahamfor virtual visit interactions. I informed Mr. DWestwoodof possible security and privacy concerns, risks, and limitations associated with providing "not-in-person" medical evaluation and management services. I also informed Mr. DMiguesof the availability of  "in-person" appointments. Finally, I informed him that there would be a charge for the virtual visit and that he could be  personally, fully or partially, financially responsible for it. Mr. DCappielloexpressed understanding and agreed to proceed.   Historic Elements   Alan Stafford a 74y.o. year old, male patient evaluated today after his last contact with our practice on 04/07/2019. Alan Stafford has a past medical history of A-fib ([facility], Abnormal heart rhythm, Arthritis, Asthma, CAP (community acquired pneumonia) (04/22/2016), Diabetes mellitus without complication ([place], Dysrhythmia, GERD (gastroesophageal reflux disease), Hemoptysis (04/22/2016), Hypercholesteremia, Hypertension, Hyperthyroidism, Kidney stones, and [place] ([place]). He also  has a past surgical history that includes Knee surgery (Right, 2015); Eye surgery (Right, 2015); Joint replacement (Right); and Cataract extraction w/PHACO (Left, 09/26/2016). Alan Stafford a current medication list which includes the following prescription(s): albuterol, aspirin ec, fifty50 glucose meter 2.0, budesonide-formoterol, enalapril, escitalopram, flecainide, glucose blood, glucose blood, glucose blood, metformin, methimazole, metoprolol succinate, nitroglycerin, omeprazole, phenazopyridine, sildenafil, trazodone, and warfarin. He  Stafford that he has never smoked. He uses smokeless tobacco. He Stafford that he does not drink alcohol or use drugs. Alan Stafford allergic to statins and sulfa antibiotics.   HPI  Today, he is being contacted for a post-procedure assessment.  The patient refers continuing to improve after the right L3 and L4 transforaminal ESI injections.  In fact, he refers that since he got the call from the nursing staff to evaluate his pain, the improvement on the relief has gone up from 70% better to 80% better.  Today I have offered him the possibility of repeating the shot and he indicates that he is doing well and for the time being he  does not feel that he needs to have another  one done.  He also indicates having attained most of the relief from the right lower back and the right hip, which right now is not really bothering her.  The only area where he still having some discomfort he is in his feet from the knee down.  Today I spoke to him about several options including trying to go for an interlaminar LESI at a lower level to see if that would help and  he indicated that he is interested, but not at this very instant.  In terms of our plan, we have decided to simply wait and see what happens since he is still improving.  He indicated that at the point where he stops improving or begins to get worse, then he will revisit the option of having the epidural steroid injection repeated and perhaps going to a different area to see if we can capture the pain and discomfort below the level of the knee.  Post-Procedure Evaluation  Procedure (04/06/2019): Diagnostic/therapeutic right L3 and L4 transforaminal ESI #1 under fluoroscopic guidance, no sedation Pre-procedure pain level:  6/10 Post-procedure: 6/10 No initial benefit, possibly due to rapid discharge after no sedation procedure, without enough time to allow full onset of block.  Sedation: None.  Alan Kos, RN  04/20/2019  4:38 PM  Sign when Signing Visit Pain relief after procedure (treated area only): (Questions asked to patient) 1. Starting about 15 minutes after the procedure, and "while the area was still numb" (from the local anesthetics), were you having any of your usual pain "in that area" (the treated area)?  (NOTE: NOT including the discomfort from the needle sticks.) First 1 hour: 100 % better. First 4-6 hours: 100 % better. 2. How long did the numbness from the local anesthetics last? (More than 6 hours?) Duration: 6 hours.  3. How much better is your pain now, when compared to before the procedure? Current benefit: 70 % better. 4. Can you move better now?  Improvement in ROM (Range of Motion): Yes. 5. Can you do more now? Improvement in function: Yes. 4. Did you have any problems with the procedure? Side-effects/Complications: No.  Current benefits: Defined as benefit that persist at this time.   Analgesia:  70% relief Function: Alan Stafford improvement in function ROM: Alan Stafford improvement in ROM  Pharmacotherapy Assessment  Analgesic: Hydrocodone/APAP 10/325, 1 tab PO q 6 hrs (40 mg/day of hydrocodone) (not prescribed by our practice) MME: 40 mg/day.   Monitoring: [place] PMP: PDMP reviewed during this encounter.       Pharmacotherapy: No side-effects or adverse reactions reported. Compliance: No problems identified. Effectiveness: Clinically acceptable. Plan: Refer to "POC".  UDS: No results found for: SUMMARY Laboratory Chemistry Profile   Renal Lab Results  Component Value Date   BUN 48 (H) 05/23/2016   CREATININE 1.83 (H) 05/23/2016   GFRAA 41 (L) 05/23/2016   GFRNONAA 36 (L) 05/23/2016    Hepatic Lab Results  Component Value Date   AST 53 (H) 05/18/2016   ALT 37 05/18/2016   ALBUMIN 3.3 (L) 05/18/2016   ALKPHOS 72 05/18/2016    Electrolytes Lab Results  Component Value Date   NA 135 05/23/2016   K 5.2 (H) 05/23/2016   CL 101 05/23/2016   CALCIUM 8.6 (L) 05/23/2016   MG 2.2 05/20/2016   PHOS 5.1 (H) 05/20/2016    Bone No results found for: VD25OH, IR518AC1YSA, YT0160FU9, NA3557DU2, 25OHVITD1, 25OHVITD2, 25OHVITD3, TESTOFREE, TESTOSTERONE  Inflammation (CRP: Acute Phase) (ESR: Chronic Phase) Lab  Results  Component Value Date   ESRSEDRATE 5 05/20/2016   LATICACIDVEN 1.4 04/22/2016      Note: Above Lab results reviewed.  Imaging  DG PAIN CLINIC C-ARM 1-60 MIN NO REPORT Fluoro was used, but no Radiologist interpretation will be provided.  Please refer to "NOTES" tab for provider progress note.    Assessment  The primary encounter diagnosis was Lumbar foraminal stenosis (Multilevel) (Severe)  (Right: L4-5; Left: L3-4). Diagnoses of Chronic lower extremity pain (Primary Source of Pain) (Bilateral) (R>L), Chronic low back pain (Secondary source of pain) (Bilateral) (R>L), and Bulging lumbar disc (T12-L1, L1-2, L2-3, and L4-5) were also pertinent to this visit.  Plan of Care  Problem-specific:  No problem-specific Assessment & Plan notes found for this encounter.  Alan Stafford has a current medication list which includes the following long-term medication(s): albuterol, escitalopram, flecainide, methimazole, metoprolol succinate, nitroglycerin, and omeprazole.  Pharmacotherapy (Medications Ordered): No orders of the defined types were placed in this encounter.  Orders:  Orders Placed This Encounter  Procedures  . Lumbar Transforaminal Epidural    Standing Status:   Standing    Number of Occurrences:   1    Standing Expiration Date:   04/20/2020    Scheduling Instructions:     Purpose: Therapeutic     Indication: Lower extremity pain. Radiculopathy/Radiculitis lumbar (M54.16).     Side: Right-sided     Level: L3 & L4     Sedation: No Sedation.     TIMEFRAME: PRN procedure. (Alan Stafford will call when needed.)    Order Specific Question:   Where will this procedure be performed?    Answer:   [facility]  . Lumbar Epidural Injection    Standing Status:   Standing    Number of Occurrences:   9    Standing Expiration Date:   10/18/2020    Scheduling Instructions:     Purpose: Therapeutic     Indication: Lower extremity pain/Sciatica right (M54.31).     Side: Right-sided     Level: L4-5     Sedation: Patient's choice.     TIMEFRAME: PRN procedure. (Alan Stafford will call when needed.)    Order Specific Question:   Where will this procedure be performed?    Answer:   [facility]   Follow-up plan:   Return for PRN Procedure(s): (R) L3 & L4 TFESI #2.      Considering:   Diagnostic bilateral lumbar facet blocks #1  Diagnostic left L3-4 LESI #1     Palliative PRN treatment(s):   Therapeutic/palliative right L3 and L4 TFESI #2  Palliative right L4-5 LESI #6  Palliative left L4-5 LESI #2  Palliative right L5-S1 LESI #3     Recent Visits Date Type Provider Dept  04/06/19 Procedure visit Milinda Pointer, MD [facility]  03/29/19 Telemedicine Milinda Pointer, MD [facility]  Showing recent visits within past 90 days and meeting all other requirements   Today's Visits Date Type Provider Dept  04/21/19 Telemedicine Milinda Pointer, MD [facility]  Showing today's visits and meeting all other requirements   Future Appointments No visits were found meeting these conditions.  Showing future appointments within next 90 days and meeting all other requirements   I discussed the assessment and treatment plan with the patient. The patient was provided an opportunity to ask questions and all were answered. The patient agreed with the plan and demonstrated an understanding of the instructions.  Patient advised to  call back or seek an in-person evaluation if the symptoms or condition worsens.  Duration of encounter: 15 minutes.  Note by: Alan Cola, MD Date: 04/21/2019; Time: 3:26 PM

## 2019-04-20 NOTE — Progress Notes (Signed)
Pain relief after procedure (treated area only): (Questions asked to patient) 1. Starting about 15 minutes after the procedure, and "while the area was still numb" (from the local anesthetics), were you having any of your usual pain "in that area" (the treated area)?  (NOTE: NOT including the discomfort from the needle sticks.) First 1 hour: 100 % better. First 4-6 hours: 100 % better. 2. How long did the numbness from the local anesthetics last? (More than 6 hours?) Duration: 6 hours.  3. How much better is your pain now, when compared to before the procedure? Current benefit: 70 % better. 4. Can you move better now? Improvement in ROM (Range of Motion): Yes. 5. Can you do more now? Improvement in function: Yes. 4. Did you have any problems with the procedure? Side-effects/Complications: No.

## 2019-04-21 ENCOUNTER — Other Ambulatory Visit: Payer: Self-pay

## 2019-04-21 ENCOUNTER — Ambulatory Visit: Payer: Medicare Other | Attending: Pain Medicine | Admitting: Pain Medicine

## 2019-04-21 DIAGNOSIS — M79604 Pain in right leg: Secondary | ICD-10-CM | POA: Diagnosis not present

## 2019-04-21 DIAGNOSIS — M5442 Lumbago with sciatica, left side: Secondary | ICD-10-CM

## 2019-04-21 DIAGNOSIS — M48061 Spinal stenosis, lumbar region without neurogenic claudication: Secondary | ICD-10-CM | POA: Diagnosis not present

## 2019-04-21 DIAGNOSIS — M5441 Lumbago with sciatica, right side: Secondary | ICD-10-CM

## 2019-04-21 DIAGNOSIS — M5126 Other intervertebral disc displacement, lumbar region: Secondary | ICD-10-CM | POA: Diagnosis not present

## 2019-04-21 DIAGNOSIS — M5136 Other intervertebral disc degeneration, lumbar region: Secondary | ICD-10-CM

## 2019-04-21 DIAGNOSIS — G8929 Other chronic pain: Secondary | ICD-10-CM

## 2019-04-21 NOTE — Patient Instructions (Signed)

## 2019-06-11 DIAGNOSIS — M79641 Pain in right hand: Secondary | ICD-10-CM | POA: Insufficient documentation

## 2019-06-22 ENCOUNTER — Other Ambulatory Visit: Payer: Self-pay | Admitting: Internal Medicine

## 2019-06-22 DIAGNOSIS — G8929 Other chronic pain: Secondary | ICD-10-CM

## 2019-07-05 ENCOUNTER — Ambulatory Visit: Payer: Medicare Other

## 2019-07-20 ENCOUNTER — Other Ambulatory Visit: Payer: Self-pay | Admitting: Internal Medicine

## 2019-07-20 DIAGNOSIS — R2689 Other abnormalities of gait and mobility: Secondary | ICD-10-CM

## 2019-07-20 DIAGNOSIS — I6523 Occlusion and stenosis of bilateral carotid arteries: Secondary | ICD-10-CM

## 2019-08-04 ENCOUNTER — Other Ambulatory Visit: Payer: Self-pay

## 2019-08-04 ENCOUNTER — Ambulatory Visit
Admission: RE | Admit: 2019-08-04 | Discharge: 2019-08-04 | Disposition: A | Discharge status: Home / Self Care | Payer: Medicare Other | Source: Ambulatory Visit | Attending: Internal Medicine | Admitting: Internal Medicine

## 2019-08-04 DIAGNOSIS — R2689 Other abnormalities of gait and mobility: Secondary | ICD-10-CM

## 2019-08-04 DIAGNOSIS — I6523 Occlusion and stenosis of bilateral carotid arteries: Secondary | ICD-10-CM | POA: Diagnosis present

## 2019-09-15 DIAGNOSIS — I639 Cerebral infarction, unspecified: Secondary | ICD-10-CM | POA: Insufficient documentation

## 2019-10-21 ENCOUNTER — Telehealth: Payer: Self-pay | Admitting: *Deleted

## 2019-10-21 NOTE — Telephone Encounter (Signed)
There is an order for LESI and TFESI. He can be scheduled.

## 2019-10-21 NOTE — Telephone Encounter (Signed)
Per Dr. Waynetta Sandy instructions, he doesn not want any prn appointments scheduled. He wants them in for eval first. Patient wants to know if he can skip eval and just get procedure. That is the question we need answered. I have him scheduled for eval first week Dr. Laban Emperor is back in clinic. Please advise what Dr. Laban Emperor says about skipping eval and moving to procedure. Thank you

## 2019-10-21 NOTE — Telephone Encounter (Signed)
Per Chip Boer:  Patient MUST have eval first.

## 2019-11-03 ENCOUNTER — Ambulatory Visit: Payer: Medicare Other | Attending: Pain Medicine | Admitting: Pain Medicine

## 2019-11-03 ENCOUNTER — Encounter: Payer: Self-pay | Admitting: Pain Medicine

## 2019-11-03 ENCOUNTER — Ambulatory Visit
Admission: RE | Admit: 2019-11-03 | Discharge: 2019-11-03 | Disposition: A | Discharge status: Home / Self Care | Payer: Medicare Other | Attending: Pain Medicine | Admitting: Pain Medicine

## 2019-11-03 ENCOUNTER — Other Ambulatory Visit: Payer: Self-pay

## 2019-11-03 ENCOUNTER — Ambulatory Visit
Admission: RE | Admit: 2019-11-03 | Discharge: 2019-11-03 | Disposition: A | Discharge status: Home / Self Care | Payer: Medicare Other | Source: Ambulatory Visit | Attending: Pain Medicine | Admitting: Pain Medicine

## 2019-11-03 VITALS — BP 115/67 | HR 62 | Temp 98.4°F | Resp 16 | Ht 74.0 in | Wt 214.0 lb

## 2019-11-03 DIAGNOSIS — G8929 Other chronic pain: Secondary | ICD-10-CM | POA: Insufficient documentation

## 2019-11-03 DIAGNOSIS — Z7901 Long term (current) use of anticoagulants: Secondary | ICD-10-CM | POA: Diagnosis present

## 2019-11-03 DIAGNOSIS — M79604 Pain in right leg: Secondary | ICD-10-CM | POA: Insufficient documentation

## 2019-11-03 DIAGNOSIS — M542 Cervicalgia: Secondary | ICD-10-CM | POA: Diagnosis not present

## 2019-11-03 DIAGNOSIS — M48061 Spinal stenosis, lumbar region without neurogenic claudication: Secondary | ICD-10-CM | POA: Insufficient documentation

## 2019-11-03 DIAGNOSIS — M25512 Pain in left shoulder: Secondary | ICD-10-CM

## 2019-11-03 DIAGNOSIS — M47812 Spondylosis without myelopathy or radiculopathy, cervical region: Secondary | ICD-10-CM

## 2019-11-03 DIAGNOSIS — M5442 Lumbago with sciatica, left side: Secondary | ICD-10-CM | POA: Insufficient documentation

## 2019-11-03 DIAGNOSIS — M5441 Lumbago with sciatica, right side: Secondary | ICD-10-CM | POA: Insufficient documentation

## 2019-11-03 NOTE — Patient Instructions (Signed)
____________________________________________________________________________________________  Preparing for your procedure (without sedation)  Procedure appointments are limited to planned procedures: . No Prescription Refills. . No disability issues will be discussed. . No medication changes will be discussed.  Instructions: . Oral Intake: Do not eat or drink anything for at least 6 hours prior to your procedure. (Exception: Blood Pressure Medication. See below.) . Transportation: Unless otherwise stated by your physician, you may drive yourself after the procedure. . Blood Pressure Medicine: Do not forget to take your blood pressure medicine with a sip of water the morning of the procedure. If your Diastolic (lower reading)is above 100 mmHg, elective cases will be cancelled/rescheduled. . Blood thinners: These will need to be stopped for procedures. Notify our staff if you are taking any blood thinners. Depending on which one you take, there will be specific instructions on how and when to stop it. . Diabetics on insulin: Notify the staff so that you can be scheduled 1st case in the morning. If your diabetes requires high dose insulin, take only  of your normal insulin dose the morning of the procedure and notify the staff that you have done so. . Preventing infections: Shower with an antibacterial soap the morning of your procedure.  . Build-up your immune system: Take 1000 mg of Vitamin C with every meal (3 times a day) the day prior to your procedure. . Antibiotics: Inform the staff if you have a condition or reason that requires you to take antibiotics before dental procedures. . Pregnancy: If you are pregnant, call and cancel the procedure. . Sickness: If you have a cold, fever, or any active infections, call and cancel the procedure. . Arrival: You must be in the facility at least 30 minutes prior to your scheduled procedure. . Children: Do not bring any children with you. . Dress  appropriately: Bring dark clothing that you would not mind if they get stained. . Valuables: Do not bring any jewelry or valuables.  Reasons to call and reschedule or cancel your procedure: (Following these recommendations will minimize the risk of a serious complication.) . Surgeries: Avoid having procedures within 2 weeks of any surgery. (Avoid for 2 weeks before or after any surgery). . Flu Shots: Avoid having procedures within 2 weeks of a flu shots or . (Avoid for 2 weeks before or after immunizations). . Barium: Avoid having a procedure within 7-10 days after having had a radiological study involving the use of radiological contrast. (Myelograms, Barium swallow or enema study). . Heart attacks: Avoid any elective procedures or surgeries for the initial 6 months after a "Myocardial Infarction" (Heart Attack). . Blood thinners: It is imperative that you stop these medications before procedures. Let us know if you if you take any blood thinner.  . Infection: Avoid procedures during or within two weeks of an infection (including chest colds or gastrointestinal problems). Symptoms associated with infections include: Localized redness, fever, chills, night sweats or profuse sweating, burning sensation when voiding, cough, congestion, stuffiness, runny nose, sore throat, diarrhea, nausea, vomiting, cold or Flu symptoms, recent or current infections. It is specially important if the infection is over the area that we intend to treat. . Heart and lung problems: Symptoms that may suggest an active cardiopulmonary problem include: cough, chest pain, breathing difficulties or shortness of breath, dizziness, ankle swelling, uncontrolled high or unusually low blood pressure, and/or palpitations. If you are experiencing any of these symptoms, cancel your procedure and contact your primary care physician for an evaluation.  Remember:  Regular   Business hours are:  Monday to Thursday 8:00 AM to 4:00  PM  Provider's Schedule: Alcee Sipos, MD:  Procedure days: Tuesday and Thursday 7:30 AM to 4:00 PM  Bilal Lateef, MD:  Procedure days: Monday and Wednesday 7:30 AM to 4:00 PM ____________________________________________________________________________________________   ____________________________________________________________________________________________  Blood Thinners  IMPORTANT NOTICE:  If you take any of these, make sure to notify the nursing staff.  Failure to do so may result in injury.  Recommended time intervals to stop and restart blood-thinners, before & after invasive procedures  Generic Name Brand Name Stop Time. Must be stopped at least this long before procedures. After procedures, wait at least this long before re-starting.  Abciximab Reopro 15 days 2 hrs  Alteplase Activase 10 days 10 days  Anagrelide Agrylin    Apixaban Eliquis 3 days 6 hrs  Cilostazol Pletal 3 days 5 hrs  Clopidogrel Plavix 7-10 days 2 hrs  Dabigatran Pradaxa 5 days 6 hrs  Dalteparin Fragmin 24 hours 4 hrs  Dipyridamole Aggrenox 11days 2 hrs  Edoxaban Lixiana; Savaysa 3 days 2 hrs  Enoxaparin  Lovenox 24 hours 4 hrs  Eptifibatide Integrillin 8 hours 2 hrs  Fondaparinux  Arixtra 72 hours 12 hrs  Prasugrel Effient 7-10 days 6 hrs  Reteplase Retavase 10 days 10 days  Rivaroxaban Xarelto 3 days 6 hrs  Ticagrelor Brilinta 5-7 days 6 hrs  Ticlopidine Ticlid 10-14 days 2 hrs  Tinzaparin Innohep 24 hours 4 hrs  Tirofiban Aggrastat 8 hours 2 hrs  Warfarin Coumadin 5 days 2 hrs   Other medications with blood-thinning effects  Product indications Generic (Brand) names Note  Cholesterol Lipitor Stop 4 days before procedure  Blood thinner (injectable) Heparin (LMW or LMWH Heparin) Stop 24 hours before procedure  Cancer Ibrutinib (Imbruvica) Stop 7 days before procedure  Malaria/Rheumatoid Hydroxychloroquine (Plaquenil) Stop 11 days before procedure  Thrombolytics  10 days before or  after procedures   Over-the-counter (OTC) Products with blood-thinning effects  Product Common names Stop Time  Aspirin > 325 mg Goody Powders, Excedrin, etc. 11 days  Aspirin ? 81 mg  7 days  Fish oil  4 days  Garlic supplements  7 days  Ginkgo biloba  36 hours  Ginseng  24 hours  NSAIDs Ibuprofen, Naprosyn, etc. 3 days  Vitamin E  4 days   ____________________________________________________________________________________________   

## 2019-11-03 NOTE — Progress Notes (Signed)
PROVIDER NOTE: Information contained herein reflects review and annotations entered in association with encounter. Interpretation of such information and data should be left to medically-trained personnel. Information provided to patient can be located elsewhere in the medical record under "Patient Instructions". Document created using STT-dictation technology, any transcriptional errors that may result from process are unintentional.    Patient: Alan Stafford  Service Category: E/M  Provider: Gaspar Cola, MD  DOB: Oct 28, 1945  DOS: 11/03/2019  Specialty: Interventional Pain Management  MRN: [id number]  Setting: Ambulatory outpatient  PCP: Raelene Bott, MD  Type: Established Patient    Referring Provider: Raelene Bott, MD  Location: Office  Delivery: Face-to-face     HPI  Reason for encounter: [name], a 74 y.o. year old male, is here today for evaluation and management of his Acute pain of left shoulder [M25.512]. [name] primary complain today is Back Pain Last encounter: Practice (10/21/2019). My last encounter with him was on Visit date not found. Pertinent problems: [name] has Lumbar spondylosis; Bulging lumbar disc (T12-L1, L1-2, L2-3, and L4-5); Lumbar foraminal stenosis (Multilevel) (Severe) (Right: L4-5; Left: L3-4); Chronic lower extremity pain (Primary Source of Pain) (Bilateral) (R>L); Chronic hip pain (Right); Chronic lumbar radicular pain (Right); Myofascial pain syndrome (right suprascapular muscle); Chronic neck pain (Bilateral) (R>L); Chronic shoulder pain (Right); Cervical spondylosis; Chronic cervical radicular pain (Right); Chronic knee pain [facility] source of pain) (Left); Compression fracture of thoracic vertebra (HCC) (T3, T4, and T8); Cervical facet hypertrophy (Bilateral C3-4); Cervical nerve root disorder; Closed wedge fracture of thoracic vertebra, sequela (T3, T4, and T8); Cervical facet syndrome (Bilateral) (R>L); Osteoarthritis of knee (Left); Chronic  pain syndrome; Diabetic peripheral neuropathy (HCC); DDD (degenerative disc disease), lumbosacral; Chronic low back pain (Secondary source of pain) (Bilateral) (R>L); Abnormal MRI, lumbar spine (03/24/2019); Compression fracture of L2 lumbar vertebra, sequela; Lumbosacral spinal stenosis (Multilevel); Chronic sacroiliac joint pain (Right); Osteoarthritis of hip (Left); Chronic pain of right knee; and Shoulder pain, left on their pertinent problem list. Pain Assessment: Severity of Chronic pain is reported as a 7 /10. Location: Back Right/right hip and down to the foot. Onset: More than a month ago. Quality: Aching, Constant. Timing: Constant. Modifying factor(s): resting. Vitals:  height is _0  (1.88 m) and weight is 214 lb (97.1 kg). His temporal temperature is 98.4 F (36.9 C). His blood pressure is 115/67 and his pulse is 62. His respiration is 16 and oxygen saturation is 98%.   This encounter is to evaluate the patient for possible interventional therapy, as requested by the patient himself.  He is on Coumadin anticoagulation and would need to stop it for 5 days prior to any procedures.  He is getting opioid analgesics but not from this practice.  It is not our intention to take over that part of his care.  His last interventional therapy was on 04/06/2019 and it consisted of a right sided L3 and L4 transforaminal ESI #1 under fluoroscopic guidance, no sedation.  He indicated having attained 70% relief of his pain without intervention.  The patient comes in today using cane and indicating an increase in his low back pain, bilaterally, with the right being worse than the left.  He also indicates having right lower extremity pain all the way down into his foot.  He indicates that the last procedure that we had done for him completely eliminated all the symptoms and he would like to have that repeated.  Reviewing the records we see that he had  a right sided L3 and L4 transforaminal epidural steroid injection  under fluoroscopic guidance, no sedation.  We will go ahead and plan to repeat this.  Reviewing his imaging, we see that his problem is that of foraminal stenosis.  In addition to the above, today he comes in complaining of left shoulder pain with movement of his neck.  Range of motion of the shoulder itself seems to be somewhat diminished, for this reason, I will be ordering x-rays of the left shoulder and the cervical spine.  Pharmacotherapy Assessment   Analgesic: Hydrocodone/APAP 10/325, 1 tab PO q 6 hrs (40 mg/day of hydrocodone) (not prescribed by our practice) MME: 40 mg/day.   Monitoring: [place] PMP: PDMP reviewed during this encounter.       Pharmacotherapy: No side-effects or adverse reactions reported. Compliance: No problems identified. Effectiveness: Clinically acceptable.  No notes on file  UDS: No results found for: SUMMARY   ROS  Constitutional: Denies any fever or chills Gastrointestinal: No reported hemesis, hematochezia, vomiting, or acute GI distress Musculoskeletal: Denies any acute onset joint swelling, redness, loss of ROM, or weakness Neurological: No reported episodes of acute onset apraxia, aphasia, dysarthria, agnosia, amnesia, paralysis, loss of coordination, or loss of consciousness  Medication Review  Fifty50 Glucose Meter 2.0, HYDROcodone-acetaminophen, albuterol, aspirin EC, budesonide-formoterol, enalapril, escitalopram, flecainide, glucose blood, metFORMIN, methimazole, metoprolol succinate, nitroGLYCERIN, omeprazole, phenazopyridine, sildenafil, traZODone, and warfarin  History Review  Allergy: [name] is allergic to statins and sulfa antibiotics. Drug: [name]  reports no history of drug use. Alcohol:  reports no history of alcohol use. Tobacco:  reports that he has never smoked. He uses smokeless tobacco. Social: [name]  reports that he has never smoked. He uses smokeless tobacco. He reports that he does not drink alcohol and does not use  drugs. Medical:  has a past medical history of A-fib ([place]), Abnormal heart rhythm, Arthritis, Asthma, CAP (community acquired pneumonia) (04/22/2016), Diabetes mellitus without complication ([place]), Dysrhythmia, GERD (gastroesophageal reflux disease), Hemoptysis (04/22/2016), Hypercholesteremia, Hypertension, Hyperthyroidism, Kidney stones, and Sepsis ([place]) (04/22/2016). Surgical: Mr. Everage  has a past surgical history that includes Knee surgery (Right, 2015); Eye surgery (Right, 2015); Joint replacement (Right); and Cataract extraction w/PHACO (Left, 09/26/2016). Family: family history includes Dementia in his mother; Heart disease in his father.  Laboratory Chemistry Profile   Renal Lab Results  Component Value Date   BUN 48 (H) 05/23/2016   CREATININE 1.83 (H) 05/23/2016   GFRAA 41 (L) 05/23/2016   GFRNONAA 36 (L) 05/23/2016     Hepatic Lab Results  Component Value Date   AST 53 (H) 05/18/2016   ALT 37 05/18/2016   ALBUMIN 3.3 (L) 05/18/2016   ALKPHOS 72 05/18/2016     Electrolytes Lab Results  Component Value Date   NA 135 05/23/2016   K 5.2 (H) 05/23/2016   CL 101 05/23/2016   CALCIUM 8.6 (L) 05/23/2016   MG 2.2 05/20/2016   PHOS 5.1 (H) 05/20/2016     Bone No results found for: VD25OH, GX211HE1DEY, CX4481EH6, DJ4970YO3, 25OHVITD1, 25OHVITD2, 25OHVITD3, TESTOFREE, TESTOSTERONE   Inflammation (CRP: Acute Phase) (ESR: Chronic Phase) Lab Results  Component Value Date   ESRSEDRATE 5 05/20/2016   LATICACIDVEN 1.4 04/22/2016       Note: Above Lab results reviewed.  Recent Imaging Review  DG Shoulder Left CLINICAL DATA:  Left shoulder pain  EXAM: LEFT SHOULDER - 2+ VIEW  COMPARISON:  None.  FINDINGS: Internal rotation, external rotation, and transscapular views of the left shoulder demonstrate  no fracture, subluxation, or dislocation. Joint spaces are well preserved. Left chest is clear.  IMPRESSION: 1. Unremarkable left shoulder.  Electronically Signed   By:  Randa Ngo M.D.   On: 11/03/2019 22:09 DG Cervical Spine With Flex & Extend CLINICAL DATA:  Cervical radiculopathy  EXAM: CERVICAL SPINE COMPLETE WITH FLEXION AND EXTENSION VIEWS  COMPARISON:  None.  FINDINGS: Frontal, bilateral oblique, lateral neutral, lateral flexion, lateral extension views of the cervical spine are obtained. Alignment is anatomic to the cervicothoracic junction. There are no acute displaced fractures. No instability with flexion or extension.  There is extensive multilevel cervical spondylosis most pronounced from C4-5 through C6-7. There is diffuse facet hypertrophy. There is symmetrical neural foraminal encroachment from C3-4 through C6-7 bilaterally. Prevertebral soft tissues are unremarkable. Lung apices are clear.  IMPRESSION: 1. Extensive multilevel cervical spondylosis and facet hypertrophy, with diffuse bilateral neural foraminal encroachment as above. 2. No acute fracture. 3. No evidence of instability.  Electronically Signed   By: Randa Ngo M.D.   On: 11/03/2019 21:54 Note: Reviewed        Physical Exam  General appearance: Well nourished, well developed, and well hydrated. In no apparent acute distress Mental status: Alert, oriented x 3 (person, place, & time)       Respiratory: No evidence of acute respiratory distress Eyes: PERLA Vitals: BP 115/67 (BP Location: Left Arm, Patient Position: Sitting, Cuff Size: Normal)   Pulse 62   Temp 98.4 F (36.9 C) (Temporal)   Resp 16   Ht _0  (1.88 m)   Wt 214 lb (97.1 kg)   SpO2 98%   BMI 27.48 kg/m  BMI: Estimated body mass index is 27.48 kg/m as calculated from the following:   Height as of this encounter: _1  (1.88 m).   Weight as of this encounter: 214 lb (97.1 kg). Ideal: Ideal body weight: 82.2 kg (181 lb 3.5 oz) Adjusted ideal body weight: 88.1 kg (194 lb 5.3 oz)  Assessment   Status Diagnosis  Controlled Controlled Controlled 1. Acute pain of left shoulder   2.  Chronic neck pain (Bilateral) (R>L)   3. Chronic low back pain (Secondary source of pain) (Bilateral) (R>L)   4. Lumbar foraminal stenosis (Multilevel) (Severe) (Right: L4-5; Left: L3-4)   5. Chronic lower extremity pain (Primary Source of Pain) (Bilateral) (R>L)   6. Chronic anticoagulation (Coumadin)      Updated Problems: No problems updated.  Plan of Care  Problem-specific:  No problem-specific Assessment & Plan notes found for this encounter.  Mr. RILAN EILAND has a current medication list which includes the following long-term medication(s): albuterol, escitalopram, flecainide, methimazole, metoprolol succinate, nitroglycerin, and omeprazole.  Pharmacotherapy (Medications Ordered): No orders of the defined types were placed in this encounter.  Orders:  Orders Placed This Encounter  Procedures  . Lumbar Transforaminal Epidural    Standing Status:   Future    Standing Expiration Date:   12/03/2019    Scheduling Instructions:     Side: Right-sided     Level: L4     Sedation: Patient's choice.     Timeframe: ASAP    Order Specific Question:   Where will this procedure be performed?    Answer:   [facility]  . DG Cervical Spine With Flex & Extend    Patient presents with axial pain with possible radicular component.  Please evaluate for any evidence of cervical spine instability. Describe the presence of any spondylolisthesis (Antero- or retrolisthesis). If present,  provide displacement "Grade" and measurement in cm. Please describe presence and specific location (Level & Laterality) of any signs of  osteoarthritis, zygapophyseal (Facet) joints DJD (including decreased joint space and/or osteophytosis), DDD, Foraminal narrowing, as well as any sclerosis and/or cyst formation. Please comment on ROM. In addition to any acute findings, please report on:  1. Facet (Zygapophyseal) joint DJD (Hypertrophy, space narrowing, subchondral sclerosis, and/or osteophyte  formation) 2. DDD and/or IVDD (Loss of disc height, desiccation or "Black disc disease") 3. Pars defects 4. Spondylolisthesis, spondylosis, and/or spondyloarthropathies (include Degree/Grade of displacement in mm) 5. Vertebral body Fractures, including age (old, new/acute) 55. Modic Type Changes 7. Demineralization 8. Bone pathology 9. Central, Lateral Recess, and/or Foraminal Stenosis (include AP diameter of stenosis in mm) 10. Surgical changes (hardware type, status, and presence of fibrosis) NOTE: Please specify level(s) and laterality. If applicable: Please indicate ROM and/or evidence of instability (>26m displacement between flexion and extension views)    Standing Status:   Future    Number of Occurrences:   1    Standing Expiration Date:   12/03/2019    Scheduling Instructions:     Imaging must be done as soon as possible. Inform patient that order will expire within 30 days and I will not renew it.    Order Specific Question:   Reason for Exam (SYMPTOM  OR DIAGNOSIS REQUIRED)    Answer:   Cervicalgia    Order Specific Question:   Preferred imaging location?    Answer:   [facility]    Order Specific Question:   Call Results- Best Contact Number?    Answer:   [phone]([facility]    Order Specific Question:   Radiology Contrast Protocol - do NOT remove file path    Answer:   [email or web address]    Order Specific Question:   Release to patient    Answer:   Immediate  . DG Shoulder Left    Standing Status:   Future    Number of Occurrences:   1    Standing Expiration Date:   12/03/2019    Scheduling Instructions:     Imaging must be done as soon as possible. Inform patient that order will expire within 30 days and I will not renew it.    Order Specific Question:   Reason for Exam (SYMPTOM  OR DIAGNOSIS REQUIRED)    Answer:   Left shoulder pain    Order Specific Question:   Preferred imaging location?    Answer:    [facility]    Order Specific Question:   Call Results- Best Contact Number?    Answer:   [phone]([facility]    Order Specific Question:   Release to patient    Answer:   Immediate   Follow-up plan:   Return for Procedure (no sedation): (R) L4 & L5 TFESI #2, (Blood Thinner Protocol).      Considering:   Diagnostic bilateral lumbar facet blocks #1  Diagnostic left L3-4 LESI #1    Palliative PRN treatment(s):   Therapeutic/palliative right L3 and L4 TFESI #2  Palliative right L4-5 LESI #6  Palliative left L4-5 LESI #2  Palliative right L5-S1 LESI #3      Recent Visits Date Type Provider Dept  11/03/19 Office Visit NMilinda Pointer MD [facility]  Showing recent visits within past 90 days and meeting all other requirements Future Appointments Date Type Provider Dept  11/11/19 Appointment NMilinda Pointer MD [facility]  Showing future  appointments within next 90 days and meeting all other requirements  I discussed the assessment and treatment plan with the patient. The patient was provided an opportunity to ask questions and all were answered. The patient agreed with the plan and demonstrated an understanding of the instructions.  Patient advised to call back or seek an in-person evaluation if the symptoms or condition worsens.  Duration of encounter: 30 minutes.  Note by: Gaspar Cola, MD Date: 11/03/2019; Time: 6:09 AM

## 2019-11-11 ENCOUNTER — Encounter: Payer: Self-pay | Admitting: Pain Medicine

## 2019-11-11 ENCOUNTER — Other Ambulatory Visit: Payer: Self-pay

## 2019-11-11 ENCOUNTER — Ambulatory Visit (HOSPITAL_BASED_OUTPATIENT_CLINIC_OR_DEPARTMENT_OTHER): Payer: Medicare Other | Admitting: Pain Medicine

## 2019-11-11 ENCOUNTER — Ambulatory Visit
Admission: RE | Admit: 2019-11-11 | Discharge: 2019-11-11 | Disposition: A | Discharge status: Home / Self Care | Payer: Medicare Other | Source: Ambulatory Visit | Attending: Pain Medicine | Admitting: Pain Medicine

## 2019-11-11 VITALS — BP 141/62 | HR 60 | Temp 97.3°F | Resp 15 | Ht 74.0 in | Wt 214.0 lb

## 2019-11-11 DIAGNOSIS — Z7901 Long term (current) use of anticoagulants: Secondary | ICD-10-CM | POA: Diagnosis present

## 2019-11-11 DIAGNOSIS — M5137 Other intervertebral disc degeneration, lumbosacral region: Secondary | ICD-10-CM

## 2019-11-11 DIAGNOSIS — M79604 Pain in right leg: Secondary | ICD-10-CM | POA: Insufficient documentation

## 2019-11-11 DIAGNOSIS — M48061 Spinal stenosis, lumbar region without neurogenic claudication: Secondary | ICD-10-CM | POA: Diagnosis present

## 2019-11-11 DIAGNOSIS — M5441 Lumbago with sciatica, right side: Secondary | ICD-10-CM | POA: Insufficient documentation

## 2019-11-11 DIAGNOSIS — G8929 Other chronic pain: Secondary | ICD-10-CM | POA: Diagnosis present

## 2019-11-11 DIAGNOSIS — M47816 Spondylosis without myelopathy or radiculopathy, lumbar region: Secondary | ICD-10-CM | POA: Insufficient documentation

## 2019-11-11 DIAGNOSIS — M5442 Lumbago with sciatica, left side: Secondary | ICD-10-CM | POA: Diagnosis present

## 2019-11-11 DIAGNOSIS — M4807 Spinal stenosis, lumbosacral region: Secondary | ICD-10-CM

## 2019-11-11 MED ORDER — DEXAMETHASONE SODIUM PHOSPHATE 10 MG/ML IJ SOLN
20.0000 mg | Freq: Once | INTRAMUSCULAR | Status: AC
  Administered 2019-11-11: 20 mg
  Filled 2019-11-11: qty 2

## 2019-11-11 MED ORDER — IOHEXOL 180 MG/ML  SOLN
10.0000 mL | Freq: Once | INTRAMUSCULAR | Status: AC
  Administered 2019-11-11: 10 mL via EPIDURAL

## 2019-11-11 MED ORDER — ROPIVACAINE HCL 2 MG/ML IJ SOLN
2.0000 mL | Freq: Once | INTRAMUSCULAR | Status: AC
  Administered 2019-11-11: 2 mL via EPIDURAL

## 2019-11-11 MED ORDER — FENTANYL CITRATE (PF) 100 MCG/2ML IJ SOLN
INTRAMUSCULAR | Status: AC
  Filled 2019-11-11: qty 2

## 2019-11-11 MED ORDER — SODIUM CHLORIDE 0.9% FLUSH
2.0000 mL | Freq: Once | INTRAVENOUS | Status: AC
  Administered 2019-11-11: 2 mL

## 2019-11-11 MED ORDER — DEXAMETHASONE SODIUM PHOSPHATE 10 MG/ML IJ SOLN
INTRAMUSCULAR | Status: AC
  Filled 2019-11-11: qty 1

## 2019-11-11 MED ORDER — MIDAZOLAM HCL 5 MG/5ML IJ SOLN
INTRAMUSCULAR | Status: AC
  Filled 2019-11-11: qty 5

## 2019-11-11 MED ORDER — LIDOCAINE HCL 2 % IJ SOLN
20.0000 mL | Freq: Once | INTRAMUSCULAR | Status: AC
  Administered 2019-11-11: 400 mg

## 2019-11-11 MED ORDER — LIDOCAINE HCL 2 % IJ SOLN
INTRAMUSCULAR | Status: AC
  Filled 2019-11-11: qty 10

## 2019-11-11 MED ORDER — IOHEXOL 180 MG/ML  SOLN
INTRAMUSCULAR | Status: AC
  Filled 2019-11-11: qty 20

## 2019-11-11 MED ORDER — SODIUM CHLORIDE (PF) 0.9 % IJ SOLN
INTRAMUSCULAR | Status: AC
  Filled 2019-11-11: qty 10

## 2019-11-11 NOTE — Progress Notes (Signed)
PROVIDER NOTE: Information contained herein reflects review and annotations entered in association with encounter. Interpretation of such information and data should be left to medically-trained personnel. Information provided to patient can be located elsewhere in the medical record under "Patient Instructions". Document created using STT-dictation technology, any transcriptional errors that may result from process are unintentional.    Patient: Alan Stafford  Service Category: Procedure  Provider: Oswaldo Done, MD  DOB: 1945/09/22  DOS: 11/11/2019  Location: [facility]  MRN: [id number]  Setting: Ambulatory - outpatient  Referring Provider: Lindwood Qua, MD  Type: Established Patient  Specialty: Interventional Pain Management  PCP: Lindwood Qua, MD   Primary Reason for Visit: Interventional Pain Management Treatment. CC: Back Pain (lower)  Procedure:          Anesthesia, Analgesia, Anxiolysis:  Type: Trans-Foraminal Epidural Steroid Injection          Purpose: Diagnostic/Therapeutic Region: Posterolateral Lumbosacral Target Area: The 6 o'clock position under the pedicle, on the affected side. Approach: Posterior Percutaneous Paravertebral approach. Level: L4 & L5 Level Laterality: Right Paravertebral  Type: Moderate (Conscious) Sedation combined with Local Anesthesia Indication(s): Analgesia and Anxiety Route: Intravenous (IV) IV Access: Secured Sedation: Meaningful verbal contact was maintained at all times during the procedure  Local Anesthetic: Lidocaine 1-2%  Position: Prone   Indications: 1. DDD (degenerative disc disease), lumbosacral   2. Lumbar spondylosis   3. Lumbar foraminal stenosis (Multilevel) (Severe) (Right: L4-5; Left: L3-4)   4. Lumbosacral spinal stenosis (Multilevel)   5. Chronic lower extremity pain (Primary Source of Pain) (Bilateral) (R>L)   6. Chronic low back pain (Secondary source of pain) (Bilateral) (R>L)   7. Chronic  anticoagulation (Coumadin)    Pain Score: Pre-procedure: 4 /10 Post-procedure: 4 /10   Today we went over the results of his cervical x-rays and shoulder x-rays.  The left shoulder x-ray did not show any apparent pathology.  However the neck does show extensive cervical DDD and spondylosis with bilateral cervical foraminal stenosis affecting multiple levels.  It also shows extensive cervical facet hypertrophy.  With this findings, it is likely that his left shoulder pain is coming from the neck area.  Today I told the patient that if he moves the neck without moving the shoulder and this triggers pain in the shoulder, it is very likely that it is coming from the neck area.  He went ahead and tried it and confirmed that when he does move the neck it does trigger pain in the shoulder.  Today he is here for the treatment of his low back and leg pain.  This pain goes over the right hip all the way down into the top of the foot and the big toe and the L5 distribution.  Pre-op Assessment:  Alan Stafford is a 74 y.o. (year old), male patient, seen today for interventional treatment. He  has a past surgical history that includes Knee surgery (Right, 2015); Eye surgery (Right, 2015); Joint replacement (Right); and Cataract extraction w/PHACO (Left, 09/26/2016). Alan Stafford has a current medication list which includes the following prescription(s): albuterol, aspirin ec, fifty50 glucose meter 2.0, budesonide-formoterol, enalapril, escitalopram, flecainide, glucose blood, glucose blood, glucose blood, hydrocodone-acetaminophen, metformin, methimazole, metoprolol succinate, nitroglycerin, omeprazole, phenazopyridine, sildenafil, trazodone, and warfarin. His primarily concern today is the Back Pain (lower)  Initial Vital Signs:  Pulse/HCG Rate: (!) 57  Temp: (!) 97.3 F (36.3 C) Resp: 16 BP: 133/68 SpO2: 99 %  BMI: Estimated body mass index is 27.48 kg/m  as calculated from the following:   Height as of this  encounter: 6\' 2"  (1.88 m).   Weight as of this encounter: 214 lb (97.1 kg).  Risk Assessment: Allergies: Reviewed. He is allergic to statins and sulfa antibiotics.  Allergy Precautions: None required Coagulopathies: Reviewed. None identified.  Blood-thinner therapy: None at this time Active Infection(s): Reviewed. None identified. Alan Stafford is afebrile  Site Confirmation: Alan Stafford was asked to confirm the procedure and laterality before marking the site Procedure checklist: Completed Consent: Before the procedure and under the influence of no sedative(s), amnesic(s), or anxiolytics, the patient was informed of the treatment options, risks and possible complications. To fulfill our ethical and legal obligations, as recommended by the American Medical Association's Code of Ethics, I have informed the patient of my clinical impression; the nature and purpose of the treatment or procedure; the risks, benefits, and possible complications of the intervention; the alternatives, including doing nothing; the risk(s) and benefit(s) of the alternative treatment(s) or procedure(s); and the risk(s) and benefit(s) of doing nothing. The patient was provided information about the general risks and possible complications associated with the procedure. These may include, but are not limited to: failure to achieve desired goals, infection, bleeding, organ or nerve damage, allergic reactions, paralysis, and death. In addition, the patient was informed of those risks and complications associated to Spine-related procedures, such as failure to decrease pain; infection (i.e.: Meningitis, epidural or intraspinal abscess); bleeding (i.e.: epidural hematoma, subarachnoid hemorrhage, or any other type of intraspinal or peri-dural bleeding); organ or nerve damage (i.e.: Any type of peripheral nerve, nerve root, or spinal cord injury) with subsequent damage to sensory, motor, and/or autonomic systems, resulting in permanent pain,  numbness, and/or weakness of one or several areas of the body; allergic reactions; (i.e.: anaphylactic reaction); and/or death. Furthermore, the patient was informed of those risks and complications associated with the medications. These include, but are not limited to: allergic reactions (i.e.: anaphylactic or anaphylactoid reaction(s)); adrenal axis suppression; blood sugar elevation that in diabetics may result in ketoacidosis or comma; water retention that in patients with history of congestive heart failure may result in shortness of breath, pulmonary edema, and decompensation with resultant heart failure; weight gain; swelling or edema; medication-induced neural toxicity; particulate matter embolism and blood vessel occlusion with resultant organ, and/or nervous system infarction; and/or aseptic necrosis of one or more joints. Finally, the patient was informed that Medicine is not an exact science; therefore, there is also the possibility of unforeseen or unpredictable risks and/or possible complications that may result in a catastrophic outcome. The patient indicated having understood very clearly. We have given the patient no guarantees and we have made no promises. Enough time was given to the patient to ask questions, all of which were answered to the patient's satisfaction. Alan Stafford has indicated that he wanted to continue with the procedure. Attestation: I, the ordering provider, attest that I have discussed with the patient the benefits, risks, side-effects, alternatives, likelihood of achieving goals, and potential problems during recovery for the procedure that I have provided informed consent. Date  Time: 11/11/2019 11:24 AM  Pre-Procedure Preparation:  Monitoring: As per clinic protocol. Respiration, ETCO2, SpO2, BP, heart rate and rhythm monitor placed and checked for adequate function Safety Precautions: Patient was assessed for positional comfort and pressure points before starting the  procedure. Time-out: I initiated and conducted the "Time-out" before starting the procedure, as per protocol. The patient was asked to participate by confirming the accuracy of the "  Time Out" information. Verification of the correct person, site, and procedure were performed and confirmed by me, the nursing staff, and the patient. "Time-out" conducted as per Joint Commission's Universal Protocol (UP.01.01.01). Time: 1145  Description of Procedure:          Area Prepped: Entire Posterior Lumbosacral Area DuraPrep (Iodine Povacrylex [0.7% available iodine] and Isopropyl Alcohol, 74% w/w) Safety Precautions: Aspiration looking for blood return was conducted prior to all injections. At no point did we inject any substances, as a needle was being advanced. No attempts were made at seeking any paresthesias. Safe injection practices and needle disposal techniques used. Medications properly checked for expiration dates. SDV (single dose vial) medications used. Description of the Procedure: Protocol guidelines were followed. The patient was placed in position over the procedure table. The target area was identified and the area prepped in the usual manner. Skin & deeper tissues infiltrated with local anesthetic. Appropriate amount of time allowed to pass for local anesthetics to take effect. The procedure needles were then advanced to the target area. Proper needle placement secured. Negative aspiration confirmed. Solution injected in intermittent fashion, asking for systemic symptoms every 0.5cc of injectate. The needles were then removed and the area cleansed, making sure to leave some of the prepping solution back to take advantage of its long term bactericidal properties.  Vitals:   11/11/19 1124 11/11/19 1143 11/11/19 1148 11/11/19 1155  BP: 133/68 (!) 146/69 138/63 (!) 141/62  Pulse: (!) 57 (!) 59 (!) 59 60  Resp: 16 15    Temp: (!) 97.3 F (36.3 C)     TempSrc: Temporal     SpO2: 99% 99% 97% 98%   Weight: 214 lb (97.1 kg)     Height: 6\' 2"  (1.88 m)       Start Time: 1145 hrs. End Time: 1155 hrs.  Materials:  Needle(s) Type: Spinal Needle Gauge: 22G Length: 3.5-in Medication(s): Please see orders for medications and dosing details.  Imaging Guidance (Spinal):          Type of Imaging Technique: Fluoroscopy Guidance (Spinal) Indication(s): Assistance in needle guidance and placement for procedures requiring needle placement in or near specific anatomical locations not easily accessible without such assistance. Exposure Time: Please see nurses notes. Contrast: Before injecting any contrast, we confirmed that the patient did not have an allergy to iodine, shellfish, or radiological contrast. Once satisfactory needle placement was completed at the desired level, radiological contrast was injected. Contrast injected under live fluoroscopy. No contrast complications. See chart for type and volume of contrast used. Fluoroscopic Guidance: I was personally present during the use of fluoroscopy. "Tunnel Vision Technique" used to obtain the best possible view of the target area. Parallax error corrected before commencing the procedure. "Direction-depth-direction" technique used to introduce the needle under continuous pulsed fluoroscopy. Once target was reached, antero-posterior, oblique, and lateral fluoroscopic projection used confirm needle placement in all planes. Images permanently stored in EMR. Interpretation: I personally interpreted the imaging intraoperatively. Adequate needle placement confirmed in multiple planes. Appropriate spread of contrast into desired area was observed. No evidence of afferent or efferent intravascular uptake. No intrathecal or subarachnoid spread observed. Permanent images saved into the patient's record.  Antibiotic Prophylaxis:   Anti-infectives (From admission, onward)   None     Indication(s): None identified  Post-operative Assessment:   Post-procedure Vital Signs:  Pulse/HCG Rate: 60  Temp: (!) 97.3 F (36.3 C) Resp: 15 BP: (!) 141/62 SpO2: 98 %  EBL: None  Complications: No immediate  post-treatment complications observed by team, or reported by patient.  Note: The patient tolerated the entire procedure well. A repeat set of vitals were taken after the procedure and the patient was kept under observation following institutional policy, for this type of procedure. Post-procedural neurological assessment was performed, showing return to baseline, prior to discharge. The patient was provided with post-procedure discharge instructions, including a section on how to identify potential problems. Should any problems arise concerning this procedure, the patient was given instructions to immediately contact us, at any time, without hesitation. In any case, we plan to contact the patient by telephone for a follow-up status report regarding this interventional procedure.  Comments:  No additional relevant information.  Plan of Care  Orders:  Orders Placed This Encounter  Procedures  . Lumbar Transforaminal Epidural    Scheduling Instructions:     Side: Right-sided     Level: L4 & L5     Sedation: Patient's choice.     Timeframe: Today    Order Specific Question:   Where will this procedure be performed?    Answer:   [facility]  . DG [facility] C-ARM 1-60 MIN NO REPORT    Intraoperative interpretation by procedural physician at [facility].    Standing Status:   Standing    Number of Occurrences:   1    Order Specific Question:   Reason for exam:    Answer:   Assistance in needle guidance and placement for procedures requiring needle placement in or near specific anatomical locations not easily accessible without such assistance.  . Informed Consent Details: Physician/Practitioner Attestation; Transcribe to consent form and obtain patient signature    Provider Attestation: I, Lavette Yankovich A. Laban Emperor, MD,  (Pain Management Specialist), the physician/practitioner, attest that I have discussed with the patient the benefits, risks, side effects, alternatives, likelihood of achieving goals and potential problems during recovery for the procedure that I have provided informed consent.    Scheduling Instructions:     Procedure: Diagnostic lumbar transforaminal epidural steroid injection under fluoroscopic guidance. (See notes for level and laterality.)     Indication/Reason: Lumbar radiculopathy/radiculitis associated with lumbar stenosis     Note: Always confirm laterality of pain with Alan Stafford, before procedure.     Transcribe to consent form and obtain patient signature.  . Care order/instruction: Please confirm that the patient has stopped the Coumadin (Warfarin) X 5 days prior to procedure or surgery.    Please confirm that the patient has stopped the Coumadin (Warfarin) X 5 days prior to procedure or surgery.    Standing Status:   Standing    Number of Occurrences:   1  . Provide equipment / supplies at bedside    "Block Tray" (Disposable  single use) Needle type: Spinal Amount/quantity: 2 Size: Medium (5-inch) Gauge: 22G    Standing Status:   Standing    Number of Occurrences:   1    Order Specific Question:   Specify    Answer:   Block Tray  . Bleeding precautions    Standing Status:   Standing    Number of Occurrences:   1   Chronic Opioid Analgesic:  Hydrocodone/APAP 10/325, 1 tab PO q 6 hrs (40 mg/day of hydrocodone) (not prescribed by our practice) MME: 40 mg/day.   Medications ordered for procedure: Meds ordered this encounter  Medications  . iohexol (OMNIPAQUE) 180 MG/ML injection 10 mL    Must be Myelogram-compatible. If not available, you may substitute with  a water-soluble, non-ionic, hypoallergenic, myelogram-compatible radiological contrast medium.  Marland Kitchen lidocaine (XYLOCAINE) 2 % (with pres) injection 400 mg  . sodium chloride flush (NS) 0.9 % injection 2 mL  . ropivacaine  (PF) 2 mg/mL (0.2%) (NAROPIN) injection 2 mL  . dexamethasone (DECADRON) injection 20 mg   Medications administered: We administered iohexol, lidocaine, sodium chloride flush, ropivacaine (PF) 2 mg/mL (0.2%), and dexamethasone.  See the medical record for exact dosing, route, and time of administration.  Follow-up plan:   Return in about 2 weeks (around 11/25/2019) for (VV), (PP) Follow-up.       Considering:   Diagnostic bilateral lumbar facet blocks #1  Diagnostic left L3-4 LESI #1    Palliative PRN treatment(s):   Therapeutic/palliative right L3 and L4 TFESI #2  Palliative right L4-5 LESI #6  Palliative left L4-5 LESI #2  Palliative right L5-S1 LESI #3       Recent Visits Date Type Provider Dept  11/11/19 Procedure visit Delano Metz, MD [facility]  11/03/19 Office Visit Delano Metz, MD [facility]  Showing recent visits within past 90 days and meeting all other requirements Future Appointments Date Type Provider Dept  12/02/19 Appointment Delano Metz, MD [facility]  Showing future appointments within next 90 days and meeting all other requirements  Disposition: Discharge home  Discharge (Date  Time): 11/11/2019; 1200 hrs.   Primary Care Physician: Lindwood Qua, MD Location: [facility] Note by: Oswaldo Done, MD Date: 11/11/2019; Time: 5:48 AM  Disclaimer:  Medicine is not an Visual merchandiser. The only guarantee in medicine is that nothing is guaranteed. It is important to note that the decision to proceed with this intervention was based on the information collected from the patient. The Data and conclusions were drawn from the patient's questionnaire, the interview, and the physical examination. Because the information was provided in large part by the patient, it cannot be guaranteed that it has not been purposely or unconsciously manipulated. Every effort has been made to obtain as much  relevant data as possible for this evaluation. It is important to note that the conclusions that lead to this procedure are derived in large part from the available data. Always take into account that the treatment will also be dependent on availability of resources and existing treatment guidelines, considered by other Pain Management Practitioners as being common knowledge and practice, at the time of the intervention. For Medico-Legal purposes, it is also important to point out that variation in procedural techniques and pharmacological choices are the acceptable norm. The indications, contraindications, technique, and results of the above procedure should only be interpreted and judged by a Board-Certified Interventional Pain Specialist with extensive familiarity and expertise in the same exact procedure and technique.

## 2019-11-11 NOTE — Patient Instructions (Signed)

## 2019-11-11 NOTE — Progress Notes (Signed)
Safety precautions to be maintained throughout the outpatient stay will include: orient to surroundings, keep bed in low position, maintain call bell within reach at all times, provide assistance with transfer out of bed and ambulation.  

## 2019-11-12 ENCOUNTER — Telehealth: Payer: Self-pay

## 2019-11-12 NOTE — Telephone Encounter (Signed)
Post procedure phone call.  No answer. No answering machine 

## 2019-12-01 ENCOUNTER — Encounter: Payer: Self-pay | Admitting: Pain Medicine

## 2019-12-02 ENCOUNTER — Ambulatory Visit: Payer: Medicare Other | Attending: Pain Medicine | Admitting: Pain Medicine

## 2019-12-02 ENCOUNTER — Other Ambulatory Visit: Payer: Self-pay

## 2019-12-02 DIAGNOSIS — M25551 Pain in right hip: Secondary | ICD-10-CM | POA: Diagnosis not present

## 2019-12-02 DIAGNOSIS — G8929 Other chronic pain: Secondary | ICD-10-CM

## 2019-12-02 DIAGNOSIS — Z7901 Long term (current) use of anticoagulants: Secondary | ICD-10-CM | POA: Diagnosis not present

## 2019-12-02 DIAGNOSIS — M1611 Unilateral primary osteoarthritis, right hip: Secondary | ICD-10-CM | POA: Diagnosis not present

## 2019-12-02 NOTE — Patient Instructions (Signed)
____________________________________________________________________________________________  Preparing for Procedure with Sedation  Procedure appointments are limited to planned procedures: . No Prescription Refills. . No disability issues will be discussed. . No medication changes will be discussed.  Instructions: . Oral Intake: Do not eat or drink anything for at least 8 hours prior to your procedure. (Exception: Blood Pressure Medication. See below.) . Transportation: Unless otherwise stated by your physician, you may drive yourself after the procedure. . Blood Pressure Medicine: Do not forget to take your blood pressure medicine with a sip of water the morning of the procedure. If your Diastolic (lower reading)is above 100 mmHg, elective cases will be cancelled/rescheduled. . Blood thinners: These will need to be stopped for procedures. Notify our staff if you are taking any blood thinners. Depending on which one you take, there will be specific instructions on how and when to stop it. . Diabetics on insulin: Notify the staff so that you can be scheduled 1st case in the morning. If your diabetes requires high dose insulin, take only  of your normal insulin dose the morning of the procedure and notify the staff that you have done so. . Preventing infections: Shower with an antibacterial soap the morning of your procedure. . Build-up your immune system: Take 1000 mg of Vitamin C with every meal (3 times a day) the day prior to your procedure. . Antibiotics: Inform the staff if you have a condition or reason that requires you to take antibiotics before dental procedures. . Pregnancy: If you are pregnant, call and cancel the procedure. . Sickness: If you have a cold, fever, or any active infections, call and cancel the procedure. . Arrival: You must be in the facility at least 30 minutes prior to your scheduled procedure. . Children: Do not bring children with you. . Dress appropriately:  Bring dark clothing that you would not mind if they get stained. . Valuables: Do not bring any jewelry or valuables.  Reasons to call and reschedule or cancel your procedure: (Following these recommendations will minimize the risk of a serious complication.) . Surgeries: Avoid having procedures within 2 weeks of any surgery. (Avoid for 2 weeks before or after any surgery). . Flu Shots: Avoid having procedures within 2 weeks of a flu shots or . (Avoid for 2 weeks before or after immunizations). . Barium: Avoid having a procedure within 7-10 days after having had a radiological study involving the use of radiological contrast. (Myelograms, Barium swallow or enema study). . Heart attacks: Avoid any elective procedures or surgeries for the initial 6 months after a "Myocardial Infarction" (Heart Attack). . Blood thinners: It is imperative that you stop these medications before procedures. Let us know if you if you take any blood thinner.  . Infection: Avoid procedures during or within two weeks of an infection (including chest colds or gastrointestinal problems). Symptoms associated with infections include: Localized redness, fever, chills, night sweats or profuse sweating, burning sensation when voiding, cough, congestion, stuffiness, runny nose, sore throat, diarrhea, nausea, vomiting, cold or Flu symptoms, recent or current infections. It is specially important if the infection is over the area that we intend to treat. . Heart and lung problems: Symptoms that may suggest an active cardiopulmonary problem include: cough, chest pain, breathing difficulties or shortness of breath, dizziness, ankle swelling, uncontrolled high or unusually low blood pressure, and/or palpitations. If you are experiencing any of these symptoms, cancel your procedure and contact your primary care physician for an evaluation.  Remember:  Regular Business hours are:    Monday to Thursday 8:00 AM to 4:00 PM  Provider's  Schedule: Dimple Bastyr, MD:  Procedure days: Tuesday and Thursday 7:30 AM to 4:00 PM  Bilal Lateef, MD:  Procedure days: Monday and Wednesday 7:30 AM to 4:00 PM ____________________________________________________________________________________________    

## 2019-12-02 NOTE — Progress Notes (Signed)
Patient: Alan Stafford  Service Category: E/M  Provider: Gaspar Cola, MD  DOB: November 21, 1945  DOS: 12/02/2019  Location: Office  MRN: [id number]  Setting: Ambulatory outpatient  Referring Provider: Raelene Bott, MD  Type: Established Patient  Specialty: Interventional Pain Management  PCP: Raelene Bott, MD  Location: Remote location  Delivery: TeleHealth     Virtual Encounter - Pain Management PROVIDER NOTE: Information contained herein reflects review and annotations entered in association with encounter. Interpretation of such information and data should be left to medically-trained personnel. Information provided to patient can be located elsewhere in the medical record under "Patient Instructions". Document created using STT-dictation technology, any transcriptional errors that may result from process are unintentional.    Contact & Pharmacy Preferred: [phone] Home: [phone] (home) Mobile: There is no such number on file (mobile). E-mail: [email or web address]  [id number] [place], [place] [address] [place] [address] Phone: [phone] Fax: [phone]   Pre-screening  Alan Stafford offered "in-person" vs "virtual" encounter. He indicated preferring virtual for this encounter.   Reason COVID-19*  Social distancing based on CDC and AMA recommendations.   I contacted Alan Stafford on 12/02/2019 via telephone.      I clearly identified myself as Gaspar Cola, MD. I verified that I was speaking with the correct person using two identifiers (Name: Alan Stafford, and date of birth: 1945-09-23).  Consent I sought verbal advanced consent from Alan Stafford for virtual visit interactions. I informed Alan Stafford of possible security and privacy concerns, risks, and limitations associated with providing "not-in-person" medical evaluation and management services. I also informed Alan Stafford of the availability of  "in-person" appointments. Finally, I informed him that there would be a charge for the virtual visit and that he could be  personally, fully or partially, financially responsible for it. Alan Stafford expressed understanding and agreed to proceed.   Historic Elements   Alan Stafford is a 74 y.o. year old, male patient evaluated today after our last contact on 11/11/2019. Alan Stafford  has a past medical history of A-fib [facility]), Abnormal heart rhythm, Arthritis, Asthma, CAP (community acquired pneumonia) (04/22/2016), Diabetes mellitus without complication ([place]), Dysrhythmia, GERD (gastroesophageal reflux disease), Hemoptysis (04/22/2016), Hypercholesteremia, Hypertension, Hyperthyroidism, Kidney stones, and Sepsis ([place]) (04/22/2016). He also  has a past surgical history that includes Knee surgery (Right, 2015); Eye surgery (Right, 2015); Joint replacement (Right); and Cataract extraction w/PHACO (Left, 09/26/2016). Alan Stafford has a current medication list which includes the following prescription(s): albuterol, aspirin ec, fifty50 glucose meter 2.0, budesonide-formoterol, duloxetine, enalapril, escitalopram, flecainide, glucose blood, glucose blood, glucose blood, hydrocodone-acetaminophen, metformin, methimazole, metoprolol succinate, nitroglycerin, omeprazole, phenazopyridine, sildenafil, trazodone, and warfarin. He  reports that he has never smoked. He uses smokeless tobacco. He reports that he does not drink alcohol and does not use drugs. Alan Stafford is allergic to statins and sulfa antibiotics.   HPI  Today, he is being contacted for a post-procedure assessment.  The patient indicates that after the right L4 and L5 transforaminal ESI his back feels better, but not pain-free.  However, in the case of the right hip he obtain absolutely no relief.  He is interested in coming in for a diagnostic hip injection to see if by any chance that can help the pain.  We will go ahead and set that up for him.  Post-Procedure  Evaluation  Procedure (11/11/2019): Diagnostic/therapeutic right sided L4 and L5 transforaminal ESI under  fluoroscopic guidance and IV sedation Pre-procedure pain level: 4/10 Post-procedure: 4/10 No relief  Sedation: Sedation provided.  Effectiveness during initial hour after procedure(Ultra-Short Term Relief): 100 %.  Local anesthetic used: Long-acting (4-6 hours) Effectiveness: Defined as any analgesic benefit obtained secondary to the administration of local anesthetics. This carries significant diagnostic value as to the etiological location, or anatomical origin, of the pain. Duration of benefit is expected to coincide with the duration of the local anesthetic used.  Effectiveness during initial 4-6 hours after procedure(Short-Term Relief): 100 %.  Long-term benefit: Defined as any relief past the pharmacologic duration of the local anesthetics.  Effectiveness past the initial 6 hours after procedure(Long-Term Relief): 50 % (got good back relief, but none in the hip).  Current benefits: Defined as benefit that persist at this time.   Analgesia:  The patient refers having attained approximately 50% relief of the low back pain, but no benefit on the hip.  He wants to come in for a diagnostic injection. Function: Somewhat improved ROM: Somewhat improved  Pharmacotherapy Assessment  Analgesic: Hydrocodone/APAP 10/325, 1 tab PO q 6 hrs (40 mg/day of hydrocodone) (not prescribed by our practice) MME: 40 mg/day.   Monitoring: [place] PMP: PDMP reviewed during this encounter.       Pharmacotherapy: No side-effects or adverse reactions reported. Compliance: No problems identified. Effectiveness: Clinically acceptable. Plan: Refer to "POC".  UDS: No results found for: SUMMARY  Laboratory Chemistry Profile   Renal Lab Results  Component Value Date   BUN 48 (H) 05/23/2016   CREATININE 1.83 (H) 05/23/2016   GFRAA 41 (L) 05/23/2016   GFRNONAA 36 (L) 05/23/2016     Hepatic Lab Results   Component Value Date   AST 53 (H) 05/18/2016   ALT 37 05/18/2016   ALBUMIN 3.3 (L) 05/18/2016   ALKPHOS 72 05/18/2016     Electrolytes Lab Results  Component Value Date   NA 135 05/23/2016   K 5.2 (H) 05/23/2016   CL 101 05/23/2016   CALCIUM 8.6 (L) 05/23/2016   MG 2.2 05/20/2016   PHOS 5.1 (H) 05/20/2016     Bone No results found for: VD25OH, [id number], IO9735HG9, JM4268TM1, 25OHVITD1, 25OHVITD2, 25OHVITD3, TESTOFREE, TESTOSTERONE   Inflammation (CRP: Acute Phase) (ESR: Chronic Phase) Lab Results  Component Value Date   ESRSEDRATE 5 05/20/2016   LATICACIDVEN 1.4 04/22/2016       Note: Above Lab results reviewed.  Imaging  DG PAIN CLINIC C-ARM 1-60 MIN NO REPORT Fluoro was used, but no Radiologist interpretation will be provided.  Please refer to "NOTES" tab for provider progress note.  Assessment  The primary encounter diagnosis was Chronic hip pain (Right). Diagnoses of Primary osteoarthritis of right hip and Chronic anticoagulation (Coumadin) were also pertinent to this visit.  Plan of Care  Problem-specific:  No problem-specific Assessment & Plan notes found for this encounter.  Alan Stafford has a current medication list which includes the following long-term medication(s): albuterol, duloxetine, escitalopram, flecainide, methimazole, metoprolol succinate, nitroglycerin, and omeprazole.  Pharmacotherapy (Medications Ordered): No orders of the defined types were placed in this encounter.  Orders:  Orders Placed This Encounter  Procedures  . HIP INJECTION    Standing Status:   Future    Standing Expiration Date:   03/03/2020    Scheduling Instructions:     Side: Right-sided     Sedation: With Sedation.     Timeframe: As soon as schedule allows  . Blood Thinner Instructions to Nursing    Always make  sure patient has clearance from prescribing physician to stop blood thinners for interventional therapies. If the patient requires a Lovenox-bridge  therapy, make sure arrangements are made to institute it with the assistance of the PCP.    Scheduling Instructions:     Have Alan Stafford stop the Coumadin (Warfarin) X 5 days prior to procedure or surgery.   Follow-up plan:   Return for Procedure (w/ sedation): (R) IA Hip Inj., (Blood Thinner Protocol).      Considering:   Diagnostic bilateral lumbar facet blocks #1  Diagnostic left L3-4 LESI #1    Palliative PRN treatment(s):   Therapeutic/palliative right L3 and L4 TFESI #2  Palliative right L4-5 LESI #6  Palliative left L4-5 LESI #2  Palliative right L5-S1 LESI #3        Recent Visits Date Type Provider Dept  11/11/19 Procedure visit Milinda Pointer, MD [facility]  11/03/19 Office Visit Milinda Pointer, MD [facility]  Showing recent visits within past 90 days and meeting all other requirements Today's Visits Date Type Provider Dept  12/02/19 Telemedicine Milinda Pointer, MD [facility]  Showing today's visits and meeting all other requirements Future Appointments No visits were found meeting these conditions. Showing future appointments within next 90 days and meeting all other requirements  I discussed the assessment and treatment plan with the patient. The patient was provided an opportunity to ask questions and all were answered. The patient agreed with the plan and demonstrated an understanding of the instructions.  Patient advised to call back or seek an in-person evaluation if the symptoms or condition worsens.  Duration of encounter: 12 minutes.  Note by: Gaspar Cola, MD Date: 12/02/2019; Time: 3:27 PM

## 2019-12-06 NOTE — Progress Notes (Signed)
12/07/2019 9:09 PM   Alan Stafford 11-Feb-1946 [id number]  Referring provider: Gennette Pac, NP [address] [place],  [place] [address]  Chief Complaint  Patient presents with  . Benign Prostatic Hypertrophy    HPI: Alan Stafford is a 74 y.o. male who presents today for evaluation and management of BPH with lower urinary tract symptoms.  Reviewed referral notes.    BPH with lower urinary tract symptoms  I PSS score: 19/1   PVR: 15 mL   Creatinine was 1.60 in 05/2019.   The patient is doing well. For the last 3 months he notes urgency and frequency. He has nocturia x 1-2. He has more day time urinary symptoms. He has occasional weak streams. Denies hematuria and dysuria.   He has occasional constipation. He can go 3-4 days without a bowel movement.  Has has back, flank and lower abdominal pain.   He has a personal history of kidney stones.   UA benign.   IPSS    Row Name 12/07/19 1500         International Prostate Symptom Score   How often have you had the sensation of not emptying your bladder? Less than 1 in 5     How often have you had to urinate less than every two hours? Almost always     How often have you found you stopped and started again several times when you urinated? Less than half the time     How often have you found it difficult to postpone urination? Almost always     How often have you had a weak urinary stream? Less than half the time     How often have you had to strain to start urination? Less than half the time     How many times did you typically get up at night to urinate? 2 Times     Total IPSS Score 19       Quality of Life due to urinary symptoms   If you were to spend the rest of your life with your urinary condition just the way it is now how would you feel about that? Pleased            Score:  1-7 Mild 8-19 Moderate 20-35 Severe  Erectile dysfunction  He can not achieve and maintain an erection. He  has no ejaculation fluid. He has spontaneous erections.  He has decreased energy. Denies penile pain and curvature. He is on sildenafil 20 mg and he takes at most 3 pills. He is taking trazodone. He tried Trimix injections in the past.    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    PMH: Past Medical History:  Diagnosis Date  . A-fib ([place])   . Abnormal heart rhythm   . Arthritis   . Asthma   . CAP (community acquired pneumonia) 04/22/2016  . Diabetes mellitus without complication ([place])   . Dysrhythmia    afib treated with flecainide  . GERD (gastroesophageal reflux disease)   . Hemoptysis 04/22/2016  . Hypercholesteremia   . Hypertension   . Hyperthyroidism   . Kidney stones   . Sepsis ([place]) 04/22/2016    Surgical History: Past Surgical History:  Procedure Laterality Date  . CATARACT EXTRACTION W/PHACO Left 09/26/2016   Procedure: CATARACT EXTRACTION PHACO AND INTRAOCULAR  LENS PLACEMENT (IOC);  Surgeon: Eulogio Bear, MD;  Location: [facility] ORS;  Service: Ophthalmology;  Laterality: Left;  [place] 00:52.0 AP% 17.0 CDE 8.86 Fluid pack lot # [id number]  . EYE SURGERY Right 2015   cataract  . JOINT REPLACEMENT Right    knee  . KNEE SURGERY Right 2015   total knee replacement revision    Home Medications:  Allergies as of 12/07/2019      Reactions   Statins Other (See Comments)   Leg weakness   Sulfa Antibiotics Rash      Medication List       Accurate as of December 07, 2019 11:59 PM. If you have any questions, ask your nurse or doctor.        Accu-Chek Comfort Curve test strip Generic drug: glucose blood   ONE TOUCH ULTRA TEST test strip Generic drug: glucose blood USE AS DIRECTED   ONE TOUCH ULTRA TEST test strip Generic drug: glucose blood USE AS DIRECTED   albuterol 108 (90 Base) MCG/ACT  inhaler Commonly known as: VENTOLIN HFA Inhale 1 puff into the lungs every 6 (six) hours as needed for wheezing or shortness of breath.   aspirin EC 81 MG tablet Take 81 mg by mouth.   budesonide-formoterol 160-4.5 MCG/ACT inhaler Commonly known as: SYMBICORT Inhale into the lungs.   DULoxetine 30 MG capsule Commonly known as: CYMBALTA Take 30 mg by mouth 2 (two) times daily.   enalapril 10 MG tablet Commonly known as: VASOTEC Take 10 mg by mouth daily.   escitalopram 20 MG tablet Commonly known as: LEXAPRO Take 20 mg by mouth daily.   Fifty50 Glucose Meter 2.0 w/Device Kit 1 each by Other route once. Use as instructed to check fasting BS daily.   Glucometer ; One-Touch   flecainide 100 MG tablet Commonly known as: TAMBOCOR Take 100 mg by mouth 2 (two) times daily.   Gemtesa 75 MG Tabs Generic drug: Vibegron Take 75 mg by mouth daily. Started by: Zara Council, PA-C   HYDROcodone-acetaminophen 10-325 MG tablet Commonly known as: NORCO Take 1 tablet by mouth every 6 (six) hours as needed.   metFORMIN 500 MG tablet Commonly known as: GLUCOPHAGE Take 500 mg by mouth 3 (three) times daily with meals.   methimazole 10 MG tablet Commonly known as: TAPAZOLE Take 10 mg by mouth 2 (two) times daily.   metoprolol succinate 25 MG 24 hr tablet Commonly known as: TOPROL-XL Take 0.5 tablets (12.5 mg total) by mouth daily. What changed: how much to take   nitroGLYCERIN 0.4 MG SL tablet Commonly known as: NITROSTAT Place 0.4 mg under the tongue every 5 (five) minutes as needed for chest pain.   omeprazole 20 MG capsule Commonly known as: PRILOSEC Take 20 mg by mouth daily.   phenazopyridine 200 MG tablet Commonly known as: PYRIDIUM Take 1 tablet by mouth 3 (three) times daily as needed.   sildenafil 100 MG tablet Commonly known as: VIAGRA Take 1 tablet (100 mg total) by mouth daily as needed for erectile dysfunction. Take two hours prior to intercourse on an  empty stomach Started by: Zara Council, PA-C   sildenafil 20 MG tablet Commonly known as: REVATIO 2-5 daily as needed. Do not take nitroglycerin afterward   traZODone 50 MG tablet Commonly known as: DESYREL Take 50 mg by mouth at bedtime.   warfarin 5 MG tablet Commonly known as: COUMADIN Take 5 mg by mouth daily.       Allergies:  Allergies  Allergen Reactions  .  Statins Other (See Comments)    Leg weakness  . Sulfa Antibiotics Rash    Family History: Family History  Problem Relation Age of Onset  . Dementia Mother   . Heart disease Father     Social History:  reports that he has never smoked. He uses smokeless tobacco. He reports that he does not drink alcohol and does not use drugs.  ROS: Pertinent ROS in HPI  Physical Exam: BP 130/80   Pulse 72   Ht 6' (1.829 m)   Wt 214 lb (97.1 kg)   BMI 29.02 kg/m   Constitutional:  Well nourished. Alert and oriented, No acute distress. HEENT: [place] AT, mask in place.  Trachea midline Cardiovascular: No clubbing, cyanosis, or edema. Respiratory: Normal respiratory effort, no increased work of breathing. GU: No CVA tenderness.  No bladder fullness or masses.  Patient with circumcised phallus. Urethral meatus is patent.  No penile discharge. No penile lesions or rashes. Scrotum without lesions, cysts, rashes and/or edema.  Testicles are located scrotally bilaterally. No masses are appreciated in the testicles. Left and right epididymis are normal. Rectal: Patient with  normal sphincter tone. Anus and perineum without scarring or rashes. No rectal masses are appreciated. Prostate is approximately 50 grams, no nodules are appreciated. Seminal vesicles were not palpated. Lymph: No cervical or inguinal adenopathy. Neurologic: Grossly intact, no focal deficits, moving all 4 extremities. Psychiatric: Normal mood and affect.  Laboratory Data:  Urinalysis Results for orders placed or performed in visit on 12/07/19  Microscopic  Examination   Urine  Result Value Ref Range   WBC, UA 0-5 0 - 5 /hpf   RBC 0-2 0 - 2 /hpf   Epithelial Cells (non renal) 0-10 0 - 10 /hpf   Casts Present (A) None seen /lpf   Cast Type Hyaline casts N/A   Bacteria, UA None seen None seen/Few  PSA  Result Value Ref Range   Prostate Specific Ag, Serum 0.3 0.0 - 4.0 ng/mL  Urinalysis, Complete  Result Value Ref Range   Specific Gravity, UA 1.020 1.005 - 1.030   pH, UA 5.0 5.0 - 7.5   Color, UA Yellow Yellow   Appearance Ur Clear Clear   Leukocytes,UA Negative Negative   Protein,UA Negative Negative/Trace   Glucose, UA Negative Negative   Ketones, UA Negative Negative   RBC, UA Trace (A) Negative   Bilirubin, UA Negative Negative   Urobilinogen, Ur 0.2 0.2 - 1.0 mg/dL   Nitrite, UA Negative Negative   Microscopic Examination See below:   Bladder Scan (Post Void Residual) in office  Result Value Ref Range   Scan Result 15   I have reviewed the labs.   Assessment & Plan:   1. BPH with LUTS  I PSS 19/1 Major complaints are urgency and frequency.  PVR 15 mL.  PSA today  Patient given Gemtesa 75 mg daily, # 28 samples RTC in 3 weeks with IPSS/PVR.  2. Erectile dysfunction On sildenafil 20 mg.  Obtained cardiac clearance for 100 mg sildenafil RTC in 3 weeks for SHIM  Return in about 3 weeks (around 12/28/2019) for IPSS, PVR and SHIM.  These notes generated with voice recognition software. I apologize for typographical errors.  Zara Council, PA-C  [facility] [address]  [place], [place] [address] [phone]  I, Selena Batten, am acting as a scribe for [employer],  I have reviewed the above documentation for accuracy and completeness, and I agree with the  above.    Zara Council, PA-C

## 2019-12-07 ENCOUNTER — Other Ambulatory Visit: Payer: Self-pay

## 2019-12-07 ENCOUNTER — Ambulatory Visit (INDEPENDENT_AMBULATORY_CARE_PROVIDER_SITE_OTHER): Payer: Medicare Other | Admitting: Urology

## 2019-12-07 ENCOUNTER — Encounter: Payer: Self-pay | Admitting: Urology

## 2019-12-07 VITALS — BP 130/80 | HR 72 | Ht 72.0 in | Wt 214.0 lb

## 2019-12-07 DIAGNOSIS — N400 Enlarged prostate without lower urinary tract symptoms: Secondary | ICD-10-CM

## 2019-12-07 DIAGNOSIS — N529 Male erectile dysfunction, unspecified: Secondary | ICD-10-CM | POA: Diagnosis not present

## 2019-12-07 LAB — BLADDER SCAN AMB NON-IMAGING: Scan Result: 15

## 2019-12-07 MED ORDER — GEMTESA 75 MG PO TABS: 75.0000 mg | ORAL_TABLET | Freq: Every day | ORAL | 0 refills | Status: DC

## 2019-12-08 ENCOUNTER — Telehealth: Payer: Self-pay | Admitting: Family Medicine

## 2019-12-08 LAB — MICROSCOPIC EXAMINATION: Bacteria, UA: NONE SEEN

## 2019-12-08 LAB — PSA: Prostate Specific Ag, Serum: 0.3 ng/mL (ref 0.0–4.0)

## 2019-12-08 LAB — URINALYSIS, COMPLETE
Bilirubin, UA: NEGATIVE
Glucose, UA: NEGATIVE
Ketones, UA: NEGATIVE
Leukocytes,UA: NEGATIVE
Nitrite, UA: NEGATIVE
Protein,UA: NEGATIVE
Specific Gravity, UA: 1.02 (ref 1.005–1.030)
Urobilinogen, Ur: 0.2 mg/dL (ref 0.2–1.0)
pH, UA: 5 (ref 5.0–7.5)

## 2019-12-08 NOTE — Telephone Encounter (Signed)
Unable to leave message, no voicemail set up.

## 2019-12-08 NOTE — Telephone Encounter (Signed)
-----   Message from Harle Battiest, PA-C sent at 12/08/2019 10:20 AM EDT ----- Please let Alan Stafford know that his PSA is normal at 0.3.

## 2019-12-09 NOTE — Telephone Encounter (Signed)
There is a telephone note from the patient to Dr. Glennis Brink office asking if it was okay if he takes 100 mg.  The note does not specify what medication he is asking for clearance and they have asked the letter to be sent to Dr. Roosvelt Harps office.  If you would send a request for cardiac clearance for patient to take sildenafil 100 mg tablets, that will be helpful.

## 2019-12-09 NOTE — Telephone Encounter (Signed)
I have not seen anything from his cardiologist.  Does he know when it was sent?

## 2019-12-09 NOTE — Telephone Encounter (Signed)
Patient notified and voiced understanding. He is asking if you received a paper from his Cardiologist about medications? I don't see anything scanned in the chart.

## 2019-12-10 NOTE — Telephone Encounter (Signed)
Medical clearance was faxed to [facility].

## 2019-12-14 ENCOUNTER — Telehealth: Payer: Self-pay | Admitting: Urology

## 2019-12-14 ENCOUNTER — Encounter: Payer: Medicare Other | Admitting: Pain Medicine

## 2019-12-14 MED ORDER — SILDENAFIL CITRATE 100 MG PO TABS: 100.0000 mg | ORAL_TABLET | Freq: Every day | ORAL | 1 refills | Status: DC | PRN

## 2019-12-14 NOTE — Telephone Encounter (Signed)
Unable to leave message, no voicemail set up.

## 2019-12-14 NOTE — Telephone Encounter (Signed)
Spoke to patient regarding the prescription.  I logged into the [employer] and sent him the coupon for [employer] via text.    Patient expressed understanding.

## 2019-12-14 NOTE — Patient Instructions (Signed)

## 2019-12-14 NOTE — Telephone Encounter (Signed)
Please let Mr. Heckard know that I sent a prescription for sildenafil 100 mg to [facility]. He will need the good Rx coupon to get the advertised price. We could either print out the coupon for him and he can pick it up at the front desk, or he could go to the good Rx website and printed off at home or we can certainly text the coupon.

## 2019-12-14 NOTE — Progress Notes (Signed)
Canceled by patient.  The patient called indicating that he forgot to stop the blood thinners (Coumadin).

## 2019-12-23 ENCOUNTER — Ambulatory Visit (HOSPITAL_BASED_OUTPATIENT_CLINIC_OR_DEPARTMENT_OTHER): Payer: Medicare Other | Admitting: Pain Medicine

## 2019-12-23 ENCOUNTER — Other Ambulatory Visit: Payer: Self-pay

## 2019-12-23 ENCOUNTER — Ambulatory Visit
Admission: RE | Admit: 2019-12-23 | Discharge: 2019-12-23 | Disposition: A | Discharge status: Home / Self Care | Payer: Medicare Other | Source: Ambulatory Visit | Attending: Pain Medicine | Admitting: Pain Medicine

## 2019-12-23 ENCOUNTER — Encounter: Payer: Self-pay | Admitting: Pain Medicine

## 2019-12-23 VITALS — BP 142/68 | HR 54 | Temp 97.2°F | Resp 18 | Ht 74.0 in | Wt 214.0 lb

## 2019-12-23 DIAGNOSIS — G8929 Other chronic pain: Secondary | ICD-10-CM | POA: Diagnosis not present

## 2019-12-23 DIAGNOSIS — M25551 Pain in right hip: Secondary | ICD-10-CM | POA: Diagnosis not present

## 2019-12-23 DIAGNOSIS — M1611 Unilateral primary osteoarthritis, right hip: Secondary | ICD-10-CM | POA: Diagnosis present

## 2019-12-23 MED ORDER — METHYLPREDNISOLONE ACETATE 80 MG/ML IJ SUSP
80.0000 mg | Freq: Once | INTRAMUSCULAR | Status: AC
  Administered 2019-12-23: 80 mg via INTRA_ARTICULAR

## 2019-12-23 MED ORDER — ROPIVACAINE HCL 2 MG/ML IJ SOLN
9.0000 mL | Freq: Once | INTRAMUSCULAR | Status: AC
  Administered 2019-12-23: 9 mL via INTRA_ARTICULAR

## 2019-12-23 MED ORDER — IOHEXOL 180 MG/ML  SOLN
10.0000 mL | Freq: Once | INTRAMUSCULAR | Status: AC
  Administered 2019-12-23: 10 mL via INTRA_ARTICULAR

## 2019-12-23 MED ORDER — ROPIVACAINE HCL 2 MG/ML IJ SOLN
INTRAMUSCULAR | Status: AC
  Filled 2019-12-23: qty 10

## 2019-12-23 MED ORDER — METHYLPREDNISOLONE ACETATE 80 MG/ML IJ SUSP
INTRAMUSCULAR | Status: AC
  Filled 2019-12-23: qty 1

## 2019-12-23 MED ORDER — LIDOCAINE HCL 2 % IJ SOLN
20.0000 mL | Freq: Once | INTRAMUSCULAR | Status: AC
  Administered 2019-12-23: 400 mg

## 2019-12-23 MED ORDER — FENTANYL CITRATE (PF) 100 MCG/2ML IJ SOLN: 25.0000 ug | INTRAMUSCULAR | Status: DC | PRN

## 2019-12-23 MED ORDER — LACTATED RINGERS IV SOLN: 1000.0000 mL | Freq: Once | INTRAVENOUS | Status: DC

## 2019-12-23 MED ORDER — MIDAZOLAM HCL 5 MG/5ML IJ SOLN: 1.0000 mg | INTRAMUSCULAR | Status: DC | PRN

## 2019-12-23 MED ORDER — LIDOCAINE HCL 2 % IJ SOLN
INTRAMUSCULAR | Status: AC
  Filled 2019-12-23: qty 20

## 2019-12-23 NOTE — Progress Notes (Signed)
Safety precautions to be maintained throughout the outpatient stay will include: orient to surroundings, keep bed in low position, maintain call bell within reach at all times, provide assistance with transfer out of bed and ambulation.  

## 2019-12-23 NOTE — Progress Notes (Signed)
PROVIDER NOTE: Information contained herein reflects review and annotations entered in association with encounter. Interpretation of such information and data should be left to medically-trained personnel. Information provided to patient can be located elsewhere in the medical record under "Patient Instructions". Document created using STT-dictation technology, any transcriptional errors that may result from process are unintentional.    Patient: Alan Stafford  Service Category: Procedure  Provider: Oswaldo Done, MD  DOB: [birth year]  DOS: 12/23/2019  Location: [facility]  MRN: [id number]  Setting: Ambulatory - outpatient  Referring Provider: Lindwood Qua, MD  Type: Established Patient  Specialty: Interventional Pain Management  PCP: Lindwood Qua, MD   Primary Reason for Visit: Interventional Pain Management Treatment. CC: Hip Pain (right)  Procedure:          Anesthesia, Analgesia, Anxiolysis:  Type: Intra-Articular Hip Injection #1  Primary Purpose: Diagnostic Region: Posterolateral hip joint area. Level: Lower pelvic and hip joint level. Target Area: Superior aspect of the hip joint cavity, going thru the superior portion of the capsular ligament. Approach: Posterolateral approach. Laterality: Right  Type: Local Anesthesia Indication(s): Analgesia         Route: Infiltration (Ramsey/IM) IV Access: Declined Sedation: Declined  Local Anesthetic: Lidocaine 1-2%  Position: Lateral Decubitus with bad side up Prepped Area: Entire Posterolateral hip area. DuraPrep (Iodine Povacrylex [0.7% available iodine] and Isopropyl Alcohol, 74% w/w)   Indications: 1. Chronic hip pain (Right)   2. Osteoarthritis of hip (Right)    Pain Score: Pre-procedure: 5 /10 Post-procedure: 5 /10   Pre-op Assessment:  Alan Stafford is a 74 y.o. (year old), male patient, seen today for interventional treatment. He  has a past surgical history that includes Knee surgery (Right, 2015); Eye  surgery (Right, 2015); Joint replacement (Right); and Cataract extraction w/PHACO (Left, 09/26/2016). Alan Stafford has a current medication list which includes the following prescription(s): albuterol, aspirin ec, fifty50 glucose meter 2.0, budesonide-formoterol, duloxetine, enalapril, escitalopram, flecainide, glucose blood, glucose blood, glucose blood, hydrocodone-acetaminophen, metformin, methimazole, metoprolol succinate, nitroglycerin, omeprazole, phenazopyridine, sildenafil, sildenafil, trazodone, gemtesa, and warfarin. His primarily concern today is the Hip Pain (right)  Initial Vital Signs:  Pulse/HCG Rate: (!) 59  Temp: (!) 97.2 F (36.2 C) Resp: 16 BP: (!) 146/72 SpO2: 100 %  BMI: Estimated body mass index is 27.48 kg/m as calculated from the following:   Height as of this encounter:  (1.88 m).   Weight as of this encounter: 214 lb (97.1 kg).  Risk Assessment: Allergies: Reviewed. He is allergic to statins and sulfa antibiotics.  Allergy Precautions: None required Coagulopathies: Reviewed. None identified.  Blood-thinner therapy: None at this time Active Infection(s): Reviewed. None identified. Alan Stafford is afebrile  Site Confirmation: Alan Stafford was asked to confirm the procedure and laterality before marking the site Procedure checklist: Completed Consent: Before the procedure and under the influence of no sedative(s), amnesic(s), or anxiolytics, the patient was informed of the treatment options, risks and possible complications. To fulfill our ethical and legal obligations, as recommended by the American Medical Association's Code of Ethics, I have informed the patient of my clinical impression; the nature and purpose of the treatment or procedure; the risks, benefits, and possible complications of the intervention; the alternatives, including doing nothing; the risk(s) and benefit(s) of the alternative treatment(s) or procedure(s); and the risk(s) and benefit(s) of doing  nothing. The patient was provided information about the general risks and possible complications associated with the procedure. These may include, but are not limited to:  failure to achieve desired goals, infection, bleeding, organ or nerve damage, allergic reactions, paralysis, and death. In addition, the patient was informed of those risks and complications associated to the procedure, such as failure to decrease pain; infection; bleeding; organ or nerve damage with subsequent damage to sensory, motor, and/or autonomic systems, resulting in permanent pain, numbness, and/or weakness of one or several areas of the body; allergic reactions; (i.e.: anaphylactic reaction); and/or death. Furthermore, the patient was informed of those risks and complications associated with the medications. These include, but are not limited to: allergic reactions (i.e.: anaphylactic or anaphylactoid reaction(s)); adrenal axis suppression; blood sugar elevation that in diabetics may result in ketoacidosis or comma; water retention that in patients with history of congestive heart failure may result in shortness of breath, pulmonary edema, and decompensation with resultant heart failure; weight gain; swelling or edema; medication-induced neural toxicity; particulate matter embolism and blood vessel occlusion with resultant organ, and/or nervous system infarction; and/or aseptic necrosis of one or more joints. Finally, the patient was informed that Medicine is not an exact science; therefore, there is also the possibility of unforeseen or unpredictable risks and/or possible complications that may result in a catastrophic outcome. The patient indicated having understood very clearly. We have given the patient no guarantees and we have made no promises. Enough time was given to the patient to ask questions, all of which were answered to the patient's satisfaction. Alan Stafford has indicated that he wanted to continue with the  procedure. Attestation: I, the ordering provider, attest that I have discussed with the patient the benefits, risks, side-effects, alternatives, likelihood of achieving goals, and potential problems during recovery for the procedure that I have provided informed consent. Date  Time: 12/23/2019 10:40 AM  Pre-Procedure Preparation:  Monitoring: As per clinic protocol. Respiration, ETCO2, SpO2, BP, heart rate and rhythm monitor placed and checked for adequate function Safety Precautions: Patient was assessed for positional comfort and pressure points before starting the procedure. Time-out: I initiated and conducted the "Time-out" before starting the procedure, as per protocol. The patient was asked to participate by confirming the accuracy of the "Time Out" information. Verification of the correct person, site, and procedure were performed and confirmed by me, the nursing staff, and the patient. "Time-out" conducted as per Joint Commission's Universal Protocol (UP.01.01.01). Time: 1104  Description of Procedure:          Safety Precautions: Aspiration looking for blood return was conducted prior to all injections. At no point did we inject any substances, as a needle was being advanced. No attempts were made at seeking any paresthesias. Safe injection practices and needle disposal techniques used. Medications properly checked for expiration dates. SDV (single dose vial) medications used. Description of the Procedure: Protocol guidelines were followed. The patient was placed in position over the fluoroscopy table. The target area was identified and the area prepped in the usual manner. Skin & deeper tissues infiltrated with local anesthetic. Appropriate amount of time allowed to pass for local anesthetics to take effect. The procedure needles were then advanced to the target area. Proper needle placement secured. Negative aspiration confirmed. Solution injected in intermittent fashion, asking for systemic  symptoms every 0.5cc of injectate. The needles were then removed and the area cleansed, making sure to leave some of the prepping solution back to take advantage of its long term bactericidal properties. Vitals:   12/23/19 1040 12/23/19 1101 12/23/19 1106 12/23/19 1109  BP: (!) 146/72 (!) 143/59 (!) 147/63 (!) 142/68  Pulse: Marland Kitchen(!)  59 (!) 54 (!) 52 (!) 54  Resp: 16 18 16 18   Temp: (!) 97.2 F (36.2 C)     SpO2: 100% 100% 100% 100%  Weight: 214 lb (97.1 kg)     Height: 6\' 2"  (1.88 m)       Start Time: 1104 hrs. End Time: 1109 hrs. Materials:  Needle(s) Type: Spinal Needle Gauge: 22G Length: 7-in Medication(s): Please see orders for medications and dosing details.  Imaging Guidance (Non-Spinal):          Type of Imaging Technique: Fluoroscopy Guidance (Non-Spinal) Indication(s): Assistance in needle guidance and placement for procedures requiring needle placement in or near specific anatomical locations not easily accessible without such assistance. Exposure Time: Please see nurses notes. Contrast: Before injecting any contrast, we confirmed that the patient did not have an allergy to iodine, shellfish, or radiological contrast. Once satisfactory needle placement was completed at the desired level, radiological contrast was injected. Contrast injected under live fluoroscopy. No contrast complications. See chart for type and volume of contrast used. Fluoroscopic Guidance: I was personally present during the use of fluoroscopy. "Tunnel Vision Technique" used to obtain the best possible view of the target area. Parallax error corrected before commencing the procedure. "Direction-depth-direction" technique used to introduce the needle under continuous pulsed fluoroscopy. Once target was reached, antero-posterior, oblique, and lateral fluoroscopic projection used confirm needle placement in all planes. Images permanently stored in EMR. Interpretation: I personally interpreted the imaging  intraoperatively. Adequate needle placement confirmed in multiple planes. Appropriate spread of contrast into desired area was observed. No evidence of afferent or efferent intravascular uptake. Permanent images saved into the patient's record.  Antibiotic Prophylaxis:   Anti-infectives (From admission, onward)   None     Indication(s): None identified  Post-operative Assessment:  Post-procedure Vital Signs:  Pulse/HCG Rate: (!) 54  Temp: (!) 97.2 F (36.2 C) Resp: 18 BP: (!) 142/68 SpO2: 100 %  EBL: None  Complications: No immediate post-treatment complications observed by team, or reported by patient.  Note: The patient tolerated the entire procedure well. A repeat set of vitals were taken after the procedure and the patient was kept under observation following institutional policy, for this type of procedure. Post-procedural neurological assessment was performed, showing return to baseline, prior to discharge. The patient was provided with post-procedure discharge instructions, including a section on how to identify potential problems. Should any problems arise concerning this procedure, the patient was given instructions to immediately contact , at any time, without hesitation. In any case, we plan to contact the patient by telephone for a follow-up status report regarding this interventional procedure.  Comments:  No additional relevant information.  Plan of Care  Orders:  Orders Placed This Encounter  Procedures  . HIP INJECTION    Scheduling Instructions:     Side: Right-sided     Sedation: Patient's choice.     Timeframe: Today  . DG PAIN CLINIC C-ARM 1-60 MIN NO REPORT    Intraoperative interpretation by procedural physician at [facility].    Standing Status:   Standing    Number of Occurrences:   1    Order Specific Question:   Reason for exam:    Answer:   Assistance in needle guidance and placement for procedures requiring needle placement in or near  specific anatomical locations not easily accessible without such assistance.  . Informed Consent Details: Physician/Practitioner Attestation; Transcribe to consent form and obtain patient signature    Nursing Order: Transcribe to consent  form and obtain patient signature. Note: Always confirm laterality of pain with Mr. Cui, before procedure.    Order Specific Question:   Physician/Practitioner attestation of informed consent for procedure/surgical case    Answer:   I, the physician/practitioner, attest that I have discussed with the patient the benefits, risks, side effects, alternatives, likelihood of achieving goals and potential problems during recovery for the procedure that I have provided informed consent.    Order Specific Question:   Procedure    Answer:   Hip injection    Order Specific Question:   Physician/Practitioner performing the procedure    Answer:   Zanasia Hickson A. Laban Emperor, MD    Order Specific Question:   Indication/Reason    Answer:   Hip Joint Pain (Arthralgia)  . Care order/instruction: Please confirm that the patient has stopped the Coumadin (Warfarin) X 5 days prior to procedure or surgery.    Please confirm that the patient has stopped the Coumadin (Warfarin) X 5 days prior to procedure or surgery.    Standing Status:   Standing    Number of Occurrences:   1  . Provide equipment / supplies at bedside    "Block Tray" (Disposable  single use) Needle type: SpinalSpinal Amount/quantity: 1 Size: Long (7-inch) Gauge: 22G    Standing Status:   Standing    Number of Occurrences:   1    Order Specific Question:   Specify    Answer:   Block Tray  . Bleeding precautions    Standing Status:   Standing    Number of Occurrences:   1   Chronic Opioid Analgesic:  Hydrocodone/APAP 10/325, 1 tab PO q 6 hrs (40 mg/day of hydrocodone) (not prescribed by our practice) MME: 40 mg/day.   Medications ordered for procedure: Meds ordered this encounter  Medications  . iohexol  (OMNIPAQUE) 180 MG/ML injection 10 mL    Must be Myelogram-compatible. If not available, you may substitute with a water-soluble, non-ionic, hypoallergenic, myelogram-compatible radiological contrast medium.  Marland Kitchen lidocaine (XYLOCAINE) 2 % (with pres) injection 400 mg  . DISCONTD: lactated ringers infusion 1,000 mL  . DISCONTD: midazolam (VERSED) 5 MG/5ML injection 1-2 mg    Make sure Flumazenil is available in the pyxis when using this medication. If oversedation occurs, administer 0.2 mg IV over 15 sec. If after 45 sec no response, administer 0.2 mg again over 1 min; may repeat at 1 min intervals; not to exceed 4 doses (1 mg)  . DISCONTD: fentaNYL (SUBLIMAZE) injection 25-50 mcg    Make sure Narcan is available in the pyxis when using this medication. In the event of respiratory depression (RR< 8/min): Titrate NARCAN (naloxone) in increments of 0.1 to 0.2 mg IV at 2-3 minute intervals, until desired degree of reversal.  . ropivacaine (PF) 2 mg/mL (0.2%) (NAROPIN) injection 9 mL  . methylPREDNISolone acetate (DEPO-MEDROL) injection 80 mg   Medications administered: We administered iohexol, lidocaine, ropivacaine (PF) 2 mg/mL (0.2%), and methylPREDNISolone acetate.  See the medical record for exact dosing, route, and time of administration.  Follow-up plan:   Return in about 2 weeks (around 01/06/2020) for (PP) Follow-up, (VV).       Considering:   Diagnostic bilateral lumbar facet blocks #1  Diagnostic left L3-4 LESI #1    Palliative PRN treatment(s):   Diagnostic right IA hip joint injection #2  Therapeutic/palliative right L3 and L4 TFESI #3  Palliative right L4-5 LESI #6  Palliative left L4-5 LESI #2  Palliative right L5-S1 LESI #3  Recent Visits Date Type Provider Dept  12/02/19 Telemedicine Delano Metz, MD [facility]  11/11/19 Procedure visit Delano Metz, MD [facility]  11/03/19 Office Visit Delano Metz, MD [facility]   Showing recent visits within past 90 days and meeting all other requirements Today's Visits Date Type Provider Dept  12/23/19 Procedure visit Delano Metz, MD [facility]  Showing today's visits and meeting all other requirements Future Appointments Date Type Provider Dept  01/06/20 Appointment Delano Metz, MD [facility]  Showing future appointments within next 90 days and meeting all other requirements  Disposition: Discharge home  Discharge (Date  Time): 12/23/2019; 1120 hrs.   Primary Care Physician: Lindwood Qua, MD Location: [facility] Note by: Oswaldo Done, MD Date: 12/23/2019; Time: 11:32 AM  Disclaimer:  Medicine is not an exact science. The only guarantee in medicine is that nothing is guaranteed. It is important to note that the decision to proceed with this intervention was based on the information collected from the patient. The Data and conclusions were drawn from the patient's questionnaire, the interview, and the physical examination. Because the information was provided in large part by the patient, it cannot be guaranteed that it has not been purposely or unconsciously manipulated. Every effort has been made to obtain as much relevant data as possible for this evaluation. It is important to note that the conclusions that lead to this procedure are derived in large part from the available data. Always take into account that the treatment will also be dependent on availability of resources and existing treatment guidelines, considered by other Pain Management Practitioners as being common knowledge and practice, at the time of the intervention. For Medico-Legal purposes, it is also important to point out that variation in procedural techniques and pharmacological choices are the acceptable norm. The indications, contraindications, technique, and results of the above procedure should only be interpreted and  judged by a Board-Certified Interventional Pain Specialist with extensive familiarity and expertise in the same exact procedure and technique.

## 2019-12-23 NOTE — Patient Instructions (Addendum)
____________________________________________________________________________________________  Post-Procedure Discharge Instructions  Instructions:  Apply ice:   Purpose: This will minimize any swelling and discomfort after procedure.   When: Day of procedure, as soon as you get home.  How: Fill a plastic sandwich bag with crushed ice. Cover it with a small towel and apply to injection site.  How long: (15 min on, 15 min off) Apply for 15 minutes then remove x 15 minutes.  Repeat sequence on day of procedure, until you go to bed.  Apply heat:   Purpose: To treat any soreness and discomfort from the procedure.  When: Starting the next day after the procedure.  How: Apply heat to procedure site starting the day following the procedure.  How long: May continue to repeat daily, until discomfort goes away.  Food intake: Start with clear liquids (like water) and advance to regular food, as tolerated.   Physical activities: Keep activities to a minimum for the first 8 hours after the procedure. After that, then as tolerated.  Driving: If you have received any sedation, be responsible and do not drive. You are not allowed to drive for 24 hours after having sedation.  Blood thinner: (Applies only to those taking blood thinners) You may restart your blood thinner 6 hours after your procedure.  Insulin: (Applies only to Diabetic patients taking insulin) As soon as you can eat, you may resume your normal dosing schedule.  Infection prevention: Keep procedure site clean and dry. Shower daily and clean area with soap and water.  Post-procedure Pain Diary: Extremely important that this be done correctly and accurately. Recorded information will be used to determine the next step in treatment. For the purpose of accuracy, follow these rules:  Evaluate only the area treated. Do not report or include pain from an untreated area. For the purpose of this evaluation, ignore all other areas of pain,  except for the treated area.  After your procedure, avoid taking a long nap and attempting to complete the pain diary after you wake up. Instead, set your alarm clock to go off every hour, on the hour, for the initial 8 hours after the procedure. Document the duration of the numbing medicine, and the relief you are getting from it.  Do not go to sleep and attempt to complete it later. It will not be accurate. If you received sedation, it is likely that you were given a medication that may cause amnesia. Because of this, completing the diary at a later time may cause the information to be inaccurate. This information is needed to plan your care.  Follow-up appointment: Keep your post-procedure follow-up evaluation appointment after the procedure (usually 2 weeks for most procedures, 6 weeks for radiofrequencies). DO NOT FORGET to bring you pain diary with you.   Expect: (What should I expect to see with my procedure?)  From numbing medicine (AKA: Local Anesthetics): Numbness or decrease in pain. You may also experience some weakness, which if present, could last for the duration of the local anesthetic.  Onset: Full effect within 15 minutes of injected.  Duration: It will depend on the type of local anesthetic used. On the average, 1 to 8 hours.   From steroids (Applies only if steroids were used): Decrease in swelling or inflammation. Once inflammation is improved, relief of the pain will follow.  Onset of benefits: Depends on the amount of swelling present. The more swelling, the longer it will take for the benefits to be seen. In some cases, up to 10 days.    Duration: Steroids will stay in the system x 2 weeks. Duration of benefits will depend on multiple posibilities including persistent irritating factors.  Side-effects: If present, they may typically last 2 weeks (the duration of the steroids).  Frequent: Cramps (if they occur, drink Gatorade and take over-the-counter Magnesium 450-500 mg  once to twice a day); water retention with temporary weight gain; increases in blood sugar; decreased immune system response; increased appetite.  Occasional: Facial flushing (red, warm cheeks); mood swings; menstrual changes.  Uncommon: Long-term decrease or suppression of natural hormones; bone thinning. (These are more common with higher doses or more frequent use. This is why we prefer that our patients avoid having any injection therapies in other practices.)   Very Rare: Severe mood changes; psychosis; aseptic necrosis.  From procedure: Some discomfort is to be expected once the numbing medicine wears off. This should be minimal if ice and heat are applied as instructed.  Call if: (When should I call?)  You experience numbness and weakness that gets worse with time, as opposed to wearing off.  New onset bowel or bladder incontinence. (Applies only to procedures done in the spine)  Emergency Numbers:  Durning business hours (Monday - Thursday, 8:00 AM - 4:00 PM) (Friday, 9:00 AM - 12:00 Noon): [phone]  After hours: [phone]  NOTE: If you are having a problem and are unable connect with, or to talk to a provider, then go to your nearest urgent care or emergency department. If the problem is serious and urgent, please call 911. ____________________________________________________________________________________________   ____________________________________________________________________________________________  Blood Thinners  IMPORTANT NOTICE:  If you take any of these, make sure to notify the nursing staff.  Failure to do so may result in injury.  Recommended time intervals to stop and restart blood-thinners, before & after invasive procedures  Generic Name Brand Name Stop Time. Must be stopped at least this long before procedures. After procedures, wait at least this long before re-starting.  Abciximab Reopro 15 days 2 hrs  Alteplase Activase 10 days 10 days   Anagrelide Agrylin    Apixaban Eliquis 3 days 6 hrs  Cilostazol Pletal 3 days 5 hrs  Clopidogrel Plavix 7-10 days 2 hrs  Dabigatran Pradaxa 5 days 6 hrs  Dalteparin Fragmin 24 hours 4 hrs  Dipyridamole Aggrenox 11days 2 hrs  Edoxaban Lixiana; Savaysa 3 days 2 hrs  Enoxaparin  Lovenox 24 hours 4 hrs  Eptifibatide Integrillin 8 hours 2 hrs  Fondaparinux  Arixtra 72 hours 12 hrs  Prasugrel Effient 7-10 days 6 hrs  Reteplase Retavase 10 days 10 days  Rivaroxaban Xarelto 3 days 6 hrs  Ticagrelor Brilinta 5-7 days 6 hrs  Ticlopidine Ticlid 10-14 days 2 hrs  Tinzaparin Innohep 24 hours 4 hrs  Tirofiban Aggrastat 8 hours 2 hrs  Warfarin Coumadin 5 days 2 hrs   Other medications with blood-thinning effects  Product indications Generic (Brand) names Note  Cholesterol Lipitor Stop 4 days before procedure  Blood thinner (injectable) Heparin (LMW or LMWH Heparin) Stop 24 hours before procedure  Cancer Ibrutinib (Imbruvica) Stop 7 days before procedure  Malaria/Rheumatoid Hydroxychloroquine (Plaquenil) Stop 11 days before procedure  Thrombolytics  10 days before or after procedures   Over-the-counter (OTC) Products with blood-thinning effects  Product Common names Stop Time  Aspirin > 325 mg Goody Powders, Excedrin, etc. 11 days  Aspirin ? 81 mg  7 days  Fish oil  4 days  Garlic supplements  7 days  Ginkgo biloba  36 hours  Ginseng    24 hours  NSAIDs Ibuprofen, Naprosyn, etc. 3 days  Vitamin E  4 days   ____________________________________________________________________________________________  Pain Management Discharge Instructions  General Discharge Instructions :  If you need to reach your doctor call: Monday-Friday 8:00 am - 4:00 pm at [phone] or toll free [phone].  After clinic hours [phone] to have operator reach doctor.  Bring all of your medication bottles to all your appointments in the pain clinic.  To cancel or reschedule your appointment with Pain  Management please remember to call 24 hours in advance to avoid a fee.  Refer to the educational materials which you have been given on: General Risks, I had my Procedure. Discharge Instructions, Post Sedation.  Post Procedure Instructions:  The drugs you were given will stay in your system until tomorrow, so for the next 24 hours you should not drive, make any legal decisions or drink any alcoholic beverages.  You may eat anything you prefer, but it is better to start with liquids then soups and crackers, and gradually work up to solid foods.  Please notify your doctor immediately if you have any unusual bleeding, trouble breathing or pain that is not related to your normal pain.  Depending on the type of procedure that was done, some parts of your body may feel week and/or numb.  This usually clears up by tonight or the next day.  Walk with the use of an assistive device or accompanied by an adult for the 24 hours.  You may use ice on the affected area for the first 24 hours.  Put ice in a Ziploc bag and cover with a towel and place against area 15 minutes on 15 minutes off.  You may switch to heat after 24 hours. 

## 2019-12-24 ENCOUNTER — Telehealth: Payer: Self-pay | Admitting: *Deleted

## 2019-12-24 NOTE — Telephone Encounter (Signed)
Spoke with patient re; procedure on yesterday.  No questions or concerns.  

## 2019-12-29 ENCOUNTER — Ambulatory Visit: Payer: Medicare Other | Admitting: Student in an Organized Health Care Education/Training Program

## 2019-12-29 NOTE — Progress Notes (Signed)
12/07/2019 7:32 PM   Alan Stafford 1946/01/03 [id number]  Referring provider: Raelene Bott, MD [address] [place],  [place] [address]  Chief Complaint  Patient presents with  . Benign Prostatic Hypertrophy    HPI: Alan Stafford is a 74 y.o. male with BPH with LU TS and ED who presents today for a three week follow up after starting Gemtesa and sildenafil.      BPH with lower urinary tract symptoms  I PSS: 12/4     PVR: 19 mL    Previous I PSS score: 19/1   previous PVR: 15 mL   Creatinine was 1.60 in 05/2019.   He is having frequency, hesitancy, weak urinary stream and splitting of the urinary stream.  Patient denies any modifying or aggravating factors.  Patient denies any gross hematuria, dysuria or suprapubic/flank pain.  Patient denies any fevers, chills, nausea or vomiting.   He found no improvement with his urinary symptoms on Gemtesa.    He has a personal history of kidney stones.   IPSS    Row Name 12/30/19 1300         International Prostate Symptom Score   How often have you had the sensation of not emptying your bladder? Less than half the time     How often have you had to urinate less than every two hours? Less than half the time     How often have you found you stopped and started again several times when you urinated? Less than half the time     How often have you found it difficult to postpone urination? About half the time     How often have you had a weak urinary stream? Less than half the time     How often have you had to strain to start urination? Not at All     How many times did you typically get up at night to urinate? 1 Time     Total IPSS Score 12       Quality of Life due to urinary symptoms   If you were to spend the rest of your life with your urinary condition just the way it is now how would you feel about that? Mostly Disatisfied            Score:  1-7 Mild 8-19 Moderate 20-35 Severe  Erectile  dysfunction SHIM score: 12 Main complaint: He cannot achieve an erection and no ejaculate fluid.   Risk factors:  age, BPH, DM, HTN, HLD, CAD and pain medication. No painful erections or curvatures with his erections.    Still having spontaneous erections.  Tried:    3 tablets of sildenafil and Trimix.  He has not yet tried the 100 mg sildenafil.  SHIM    Row Name 12/30/19 1334         SHIM: Over the last 6 months:   How do you rate your confidence that you could get and keep an erection? Low     When you had erections with sexual stimulation, how often were your erections hard enough for penetration (entering your partner)? A Few Times (much less than half the time)     During sexual intercourse, how often were you able to maintain your erection after you had penetrated (entered) your partner? Sometimes (about half the time)     During sexual intercourse, how difficult was it to maintain your erection to completion of intercourse? Difficult  When you attempted sexual intercourse, how often was it satisfactory for you? 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    PMH: Past Medical History:  Diagnosis Date  . A-fib ([place])   . Abnormal heart rhythm   . Arthritis   . Asthma   . CAP (community acquired pneumonia) 04/22/2016  . Diabetes mellitus without complication ([place])   . Dysrhythmia    afib treated with flecainide  . GERD (gastroesophageal reflux disease)   . Hemoptysis 04/22/2016  . Hypercholesteremia   . Hypertension   . Hyperthyroidism   . Kidney stones   . Sepsis ([place]) 04/22/2016    Surgical History: Past Surgical  History:  Procedure Laterality Date  . CATARACT EXTRACTION W/PHACO Left 09/26/2016   Procedure: CATARACT EXTRACTION PHACO AND INTRAOCULAR LENS PLACEMENT (IOC);  Surgeon: Eulogio Bear, MD;  Location: [facility] ORS;  Service: Ophthalmology;  Laterality: Left;  [place] 00:52.0 AP% 17.0 CDE 8.86 Fluid pack lot # [id number]  . EYE SURGERY Right 2015   cataract  . JOINT REPLACEMENT Right    knee  . KNEE SURGERY Right 2015   total knee replacement revision    Home Medications:  Allergies as of 12/30/2019      Reactions   Statins Other (See Comments)   Leg weakness   Sulfa Antibiotics Rash      Medication List       Accurate as of December 30, 2019 11:59 PM. If you have any questions, ask your nurse or doctor.        Accu-Chek Comfort Curve test strip Generic drug: glucose blood   ONE TOUCH ULTRA TEST test strip Generic drug: glucose blood USE AS DIRECTED   ONE TOUCH ULTRA TEST test strip Generic drug: glucose blood USE AS DIRECTED   albuterol 108 (90 Base) MCG/ACT inhaler Commonly known as: VENTOLIN HFA Inhale 1 puff into the lungs every 6 (six) hours as needed for wheezing or shortness of breath.   aspirin EC 81 MG tablet Take 81 mg by mouth.   budesonide-formoterol 160-4.5 MCG/ACT inhaler Commonly known as: SYMBICORT Inhale into the lungs.   DULoxetine 30 MG capsule Commonly known as: CYMBALTA Take 30 mg by mouth 2 (two) times daily.   enalapril 10 MG tablet Commonly known as: VASOTEC Take 10 mg by mouth daily.   escitalopram 20 MG tablet Commonly known as: LEXAPRO Take 20 mg by mouth daily.   Fifty50 Glucose Meter 2.0 w/Device Kit 1 each by Other route once. Use as instructed to check fasting BS daily.   Glucometer ; One-Touch   flecainide 100 MG tablet Commonly known as: TAMBOCOR Take 100 mg by mouth 2 (two) times daily.   Gemtesa 75 MG Tabs Generic drug: Vibegron Take 75 mg by mouth daily.   HYDROcodone-acetaminophen 10-325 MG tablet Commonly known  as: NORCO Take 1 tablet by mouth every 6 (six) hours as needed.   metFORMIN 500 MG tablet Commonly known as: GLUCOPHAGE Take 500 mg by mouth 3 (three) times daily with meals.   methimazole 10 MG tablet Commonly known as: TAPAZOLE Take 10  mg by mouth 2 (two) times daily.   metoprolol succinate 25 MG 24 hr tablet Commonly known as: TOPROL-XL Take 0.5 tablets (12.5 mg total) by mouth daily. What changed: how much to take   nitroGLYCERIN 0.4 MG SL tablet Commonly known as: NITROSTAT Place 0.4 mg under the tongue every 5 (five) minutes as needed for chest pain.   omeprazole 20 MG capsule Commonly known as: PRILOSEC Take 20 mg by mouth daily.   phenazopyridine 200 MG tablet Commonly known as: PYRIDIUM Take 1 tablet by mouth 3 (three) times daily as needed.   sildenafil 100 MG tablet Commonly known as: VIAGRA Take 1 tablet (100 mg total) by mouth daily as needed for erectile dysfunction. Take two hours prior to intercourse on an empty stomach   sildenafil 20 MG tablet Commonly known as: REVATIO 2-5 daily as needed. Do not take nitroglycerin afterward   traZODone 50 MG tablet Commonly known as: DESYREL Take 50 mg by mouth at bedtime.   warfarin 5 MG tablet Commonly known as: COUMADIN Take 5 mg by mouth daily.       Allergies:  Allergies  Allergen Reactions  . Statins Other (See Comments)    Leg weakness  . Sulfa Antibiotics Rash    Family History: Family History  Problem Relation Age of Onset  . Dementia Mother   . Heart disease Father     Social History:  reports that he has never smoked. He uses smokeless tobacco. He reports that he does not drink alcohol and does not use drugs.  ROS: Pertinent ROS in HPI  Physical Exam: BP (!) 158/81   Pulse 77   Ht 6' 2"  (1.88 m)   Wt 214 lb (97.1 kg)   BMI 27.48 kg/m   Constitutional:  Well nourished. Alert and oriented, No acute distress. HEENT: [place] AT, mask in place.  Trachea midline Cardiovascular: No  clubbing, cyanosis, or edema. Respiratory: Normal respiratory effort, no increased work of breathing. Neurologic: Grossly intact, no focal deficits, moving all 4 extremities. Psychiatric: Normal mood and affect.  Laboratory Data: Specimen:  Blood  Ref Range & Units 6 mo ago  Free T4 0.71 - 1.40 ng/dL 0.92      Resulting Agency  [facility]  Specimen Collected: 06/08/19 12:17 PM Last Resulted: 06/08/19 6:23 PM  Received From: [place]  Result Received: 06/22/19 9:51 AM  Specimen:  Blood  Ref Range & Units 6 mo ago  TSH 0.600 - 3.300 uIU/mL 2.800      Resulting Agency  [facility]  Specimen Collected: 06/08/19 12:17 PM Last Resulted: 06/08/19 3:28 PM  Received From: [place]  Result Received: 06/22/19 9:51 AM  Specimen:  Blood  Ref Range & Units 6 mo ago  Sodium 135 - 145 mmol/L 139      Potassium 3.5 - 5.0 mmol/L 5.5High      Chloride 98 - 107 mmol/L 104      Anion Gap 7 - 15 mmol/L 10      CO2 22.0 - 32.0 mmol/L 25.0      BUN 7 - 21 mg/dL 25High      Creatinine 0.70 - 1.30 mg/dL 1.60High      BUN/Creatinine Ratio  16      EGFR CKD-EPI Non-African American, Male >=60 mL/min/1.22m 42Low      EGFR CKD-EPI African American, Male >=60 mL/min/1.727m49Low      Glucose 74 - 106 mg/dL 117High  Calcium 8.5 - 10.2 mg/dL 9.4      Albumin 3.4 - 5.0 g/dL 4.3      Total Protein 6.5 - 8.3 g/dL 7.3      Total Bilirubin 0.1 - 1.2 mg/dL 0.5      AST 19 - 55 U/L 43      ALT <50 U/L 15      Alkaline Phosphatase 38 - 126 U/L 71      Resulting Agency  [facility]  Specimen Collected: 06/08/19 12:17 PM Last Resulted: 06/08/19 2:57 PM  Received From: [facility]  Result Received: 01/02/20 7:17 PM   Specimen:  Blood  Ref Range & Units 6 mo ago Comments  Triglycerides <150 mg/dL 146        Cholesterol 100 - 199 mg/dL 208High        HDL >=40 mg/dL 46        LDL  Calculated 60 - 99 mg/dL 133High    I have reviewed the labs.  Pertinent Imaging Results for Alan Stafford, Alan Stafford (MRN [id number]) as of 01/02/2020 19:18  Ref. Range 12/30/2019 13:33  Scan Result Unknown 19 ml   Assessment & Plan:   1. BPH with LUTS  I PSS 12/4, it is stable Major complaints is frequency.  I recommended that he undergo a cystoscopy for further evaluation of his urinary symptoms as medication did not improve them.  I explained that he may have a stricture, a prostate obstructing the flow of urine or a malignancy.  He declines at this time stating that his erections were never the same after he underwent the cysto/ URS for his stones I then recommended that he have a KUB to evaluate for stones or constipation that may be contributing to his urinary symptoms - he is agreeable to this and will get one next week  2. Erectile dysfunction SHIM score 12 He has not yet tried the 100 mg sildenafil  Return for pending KUB results .  These notes generated with voice recognition software. I apologize for typographical errors.  Zara Council, PA-C  [place] [address]  [place], [place] [address] [phone]  I spent 25 minutes on the day of the encounter to include pre-visit record review, face-to-face time with the patient, and post-visit ordering of tests.

## 2019-12-30 ENCOUNTER — Other Ambulatory Visit: Payer: Self-pay

## 2019-12-30 ENCOUNTER — Ambulatory Visit (INDEPENDENT_AMBULATORY_CARE_PROVIDER_SITE_OTHER): Payer: Medicare Other | Admitting: Urology

## 2019-12-30 VITALS — BP 158/81 | HR 77 | Ht 74.0 in | Wt 214.0 lb

## 2019-12-30 DIAGNOSIS — N200 Calculus of kidney: Secondary | ICD-10-CM | POA: Diagnosis not present

## 2019-12-30 DIAGNOSIS — Z87442 Personal history of urinary calculi: Secondary | ICD-10-CM

## 2019-12-30 DIAGNOSIS — N529 Male erectile dysfunction, unspecified: Secondary | ICD-10-CM

## 2019-12-30 DIAGNOSIS — N400 Enlarged prostate without lower urinary tract symptoms: Secondary | ICD-10-CM

## 2019-12-30 LAB — BLADDER SCAN AMB NON-IMAGING: Scan Result: 19

## 2020-01-02 ENCOUNTER — Encounter: Payer: Self-pay | Admitting: Urology

## 2020-01-04 ENCOUNTER — Telehealth: Payer: Self-pay | Admitting: *Deleted

## 2020-01-04 NOTE — Telephone Encounter (Signed)
Pt calling with questions regarding the last office visit, pt was confused on whether he should be getting both the KUB and Cysto? I advised that they both are looking for solutions to his urinary problems. Per pt he doesn't see the need for both, per pt he will get the KUB this week and for [place] to call if he needs the cysto after looking at the results of the KUB.

## 2020-01-05 ENCOUNTER — Encounter: Payer: Self-pay | Admitting: Pain Medicine

## 2020-01-05 ENCOUNTER — Telehealth: Payer: Self-pay

## 2020-01-05 NOTE — Progress Notes (Signed)
Unsuccessful attempt to contact patient for Virtual Visit (Pain Management Telehealth)   Patient provided contact information:  [phone] (home); There is no such number on file (mobile).; (Preferred) [phone] [email or web address]   Pre-screening:  Our staff was unsuccessful in contacting Mr. Pinedo using the above provided information.   I unsuccessfully attempted to make contact with Angelique Blonder on 01/06/2020 via telephone. I was unable to complete the virtual encounter due to no one answering the phone. I was unable to leave a message.  Pharmacotherapy Assessment  Analgesic: Hydrocodone/APAP 10/325, 1 tab PO q 6 hrs (40 mg/day of hydrocodone) (not prescribed by our practice) MME: 40 mg/day.   Follow-up plan:   Reschedule Visit.     Considering:   Diagnostic bilateral lumbar facet blocks #1  Diagnostic left L3-4 LESI #1    Palliative PRN treatment(s):   Diagnostic right IA hip joint injection #2  Therapeutic/palliative right L3 and L4 TFESI #3  Palliative right L4-5 LESI #6  Palliative left L4-5 LESI #2  Palliative right L5-S1 LESI #3      Recent Visits Date Type Provider Dept  12/23/19 Procedure visit Delano Metz, MD [facility]  12/02/19 Telemedicine Delano Metz, MD [facility]  11/11/19 Procedure visit Delano Metz, MD [facility]  11/03/19 Office Visit Delano Metz, MD [facility]  Showing recent visits within past 90 days and meeting all other requirements Today's Visits Date Type Provider Dept  01/06/20 Telemedicine Delano Metz, MD [facility]  Showing today's visits and meeting all other requirements Future Appointments No visits were found meeting these conditions. Showing future appointments within next 90 days and meeting all other requirements   Note by: Oswaldo Done, MD Date: 01/06/2020; Time: 5:01 PM

## 2020-01-05 NOTE — Telephone Encounter (Signed)
Attempted to call patient for pre virtual appointment questions.  No answer.  

## 2020-01-06 ENCOUNTER — Other Ambulatory Visit: Payer: Self-pay

## 2020-01-06 ENCOUNTER — Ambulatory Visit: Payer: Medicare Other | Attending: Pain Medicine | Admitting: Pain Medicine

## 2020-01-06 DIAGNOSIS — G894 Chronic pain syndrome: Secondary | ICD-10-CM

## 2020-01-25 NOTE — Telephone Encounter (Signed)
Patient states he will get KUB this week at the [facility].

## 2020-01-25 NOTE — Telephone Encounter (Signed)
Would you please call Alan Stafford and remind him to get his KUB?

## 2020-01-28 ENCOUNTER — Other Ambulatory Visit: Payer: Self-pay

## 2020-01-28 ENCOUNTER — Ambulatory Visit
Admission: RE | Admit: 2020-01-28 | Discharge: 2020-01-28 | Disposition: A | Discharge status: Home / Self Care | Payer: Medicare Other | Source: Ambulatory Visit | Attending: Urology | Admitting: Urology

## 2020-01-28 ENCOUNTER — Other Ambulatory Visit: Payer: Self-pay | Admitting: Radiology

## 2020-01-28 ENCOUNTER — Ambulatory Visit
Admission: RE | Admit: 2020-01-28 | Discharge: 2020-01-28 | Disposition: A | Discharge status: Home / Self Care | Payer: Medicare Other | Attending: Urology | Admitting: Urology

## 2020-01-28 DIAGNOSIS — N200 Calculus of kidney: Secondary | ICD-10-CM | POA: Insufficient documentation

## 2020-02-09 ENCOUNTER — Telehealth: Payer: Self-pay

## 2020-02-09 DIAGNOSIS — N200 Calculus of kidney: Secondary | ICD-10-CM

## 2020-02-09 NOTE — Telephone Encounter (Signed)
Incoming call on triage line from patient in regards to his KUB results from last week. Patient would like a call back with those results.

## 2020-02-09 NOTE — Telephone Encounter (Signed)
There were no stones seen on Alan Stafford KUB, but his intestines were full of stool.  This makes it difficult to evaluate for stones.  I suggest he take laxative and have his x-ray repeated.

## 2020-02-10 NOTE — Telephone Encounter (Signed)
The phone number we have in the chart is the incorrect number. [phone]

## 2020-02-11 NOTE — Addendum Note (Signed)
Addended by: Honor Loh on: 02/11/2020 02:49 PM   Modules accepted: Orders

## 2020-02-11 NOTE — Telephone Encounter (Signed)
Patient notified and will use laxatives and he will get another xray. KUB order is in.

## 2020-10-23 ENCOUNTER — Telehealth: Payer: Self-pay | Admitting: Pain Medicine

## 2020-10-23 ENCOUNTER — Telehealth: Payer: Self-pay | Admitting: *Deleted

## 2020-10-23 NOTE — Telephone Encounter (Signed)
Spoke with Alan Stafford and let him know that we have to schedule an appt with FN prior to scheduling for a procedure.  I asked if he would like [name] to call him back to get him scheduled. He would in fact like to do that.

## 2020-10-23 NOTE — Telephone Encounter (Signed)
error 

## 2020-10-24 NOTE — Progress Notes (Signed)
Patient: Alan Stafford  Service Category: E/M  Provider: Gaspar Cola, MD  DOB: 09-28-1945  DOS: 10/25/2020  Location: Office  MRN: [id number]  Setting: Ambulatory outpatient  Referring Provider: Raelene Bott, MD  Type: Established Patient  Specialty: Interventional Pain Management  PCP: Alan Bott, MD  Location: Remote location  Delivery: TeleHealth     Virtual Encounter - Pain Management PROVIDER NOTE: Information contained herein reflects review and annotations entered in association with encounter. Interpretation of such information and data should be left to medically-trained personnel. Information provided to patient can be located elsewhere in the medical record under "Patient Instructions". Document created using STT-dictation technology, any transcriptional errors that may result from process are unintentional.    Contact & Pharmacy Preferred: [phone] Home: [phone] (home) Mobile: There is no such number on file (mobile). E-mail: [email or web address]  [id number] [address]: [phone]: [phone]-[place] [place]Phone: [phone]: [phone]  Pre-screening  Mr. DMcparlandoffered "in-person" vs "virtual" encounter. He indicated preferring virtual for this encounter.   Reason COVID-19*  Social distancing based on CDC and AMA recommendations.   I contacted Alan Abrahamon 10/25/2020 via telephone.      I clearly identified myself as FGaspar Cola MD. I verified that I was speaking with the correct person using two identifiers (Name: Alan Stafford and date of birth: 7Jun 29, 1947.  Consent I sought verbal advanced consent from Alan Abrahamfor virtual visit interactions. I informed Alan Stafford possible security and privacy concerns, risks, and  limitations associated with providing "not-in-person" medical evaluation and management services. I also informed Alan Stafford the availability of "in-person" appointments. Finally, I informed him that there would be a charge for the virtual visit and that he could be  personally, fully or partially, financially responsible for it. Alan Stafford.   Historic Elements   Mr. SMARKS SCALERAis a 75y.o. year old, male patient evaluated today after our last contact on 10/23/2020. Alan Stafford has a past medical history of A-fib ([facility], Abnormal heart rhythm, Arthritis, Asthma, CAP (community acquired pneumonia) (04/22/2016), Diabetes mellitus without complication ([place], Dysrhythmia, GERD (gastroesophageal reflux disease), Hemoptysis (04/22/2016), Hypercholesteremia, Hypertension, Hyperthyroidism, Kidney stones, and [place]epsis [place] (04/22[place]). He also  has a past surgical history that includes Knee surgery (Right, 2015); Eye surgery (Right, 2015); Joint replacement (Right); and Cataract extraction w/PHACO (Left, 09/26/2016). Mr. DRodriguezhas a current medication list which includes the following prescription(s): albuterol, aspirin ec, fifty50 glucose meter 2.0, budesonide-formoterol, duloxetine, enalapril, escitalopram, flecainide, glucose blood, glucose blood, glucose blood, hydrocodone-acetaminophen, metformin, methimazole, metoprolol succinate, nitroglycerin, omeprazole, phenazopyridine, sildenafil, sildenafil, trazodone, gemtesa, and warfarin. He  reports that he has never smoked. He uses smokeless tobacco. He reports that he does not drink alcohol and does not use drugs. Alan Stafford.   HPI  Today, he is being contacted for worsening of previously known (established) problem.  The patient indicates having recurrence of his low back pain and right hip pain.  On 12/23/2019 we did an intra-articular hip joint injection which provided him with  excellent relief of the hip joint pain.  Prior to that on 11/11/2019 we had done a right-sided L4 and L5 transforaminal epidural steroid injection  which also did provide him with excellent relief of the pain.  The origin of his low back pain and hip pain seem to be different.  We will go ahead and reschedule him for the transforaminal epidural steroid injection and if again he continues to have some discomfort in the hip area after having alleviated his low back pain, then we will repeat that hip injection.  Pharmacotherapy Assessment   Analgesic: Hydrocodone/APAP 10/325, 1 tab PO q 6 hrs (40 mg/day of hydrocodone) (not prescribed by our practice) MME: 40 mg/day.   Monitoring:  PMP: PDMP reviewed during this encounter.       Pharmacotherapy: No side-effects or adverse reactions reported. Compliance: No problems identified. Effectiveness: Clinically acceptable. Plan: Refer to "POC". UDS: No results found for: SUMMARY   Laboratory Chemistry Profile   Renal Lab Results  Component Value Date   BUN 48 (H) 05/23/2016   CREATININE 1.83 (H) 05/23/2016   GFRAA 41 (L) 05/23/2016   GFRNONAA 36 (L) 05/23/2016    Hepatic Lab Results  Component Value Date   AST 53 (H) 05/18/2016   ALT 37 05/18/2016   ALBUMIN 3.3 (L) 05/18/2016   ALKPHOS 72 05/18/2016    Electrolytes Lab Results  Component Value Date   NA 135 05/23/2016   K 5.2 (H) 05/23/2016   CL 101 05/23/2016   CALCIUM 8.6 (L) 05/23/2016   MG 2.2 05/20/2016   PHOS 5.1 (H) 05/20/2016    Bone No results found for: VD25OH, UY403KV4QVZ, DG3875IE3, PI9518AC1, 25OHVITD1, 25OHVITD2, 25OHVITD3, TESTOFREE, TESTOSTERONE  Inflammation (CRP: Acute Phase) (ESR: Chronic Phase) Lab Results  Component Value Date   ESRSEDRATE 5 05/20/2016   LATICACIDVEN 1.4 04/22/2016         Note: Above Lab results reviewed.  Imaging  Abdomen 1 view (KUB) CLINICAL DATA:  Evaluate for renal stones.  EXAM: ABDOMEN - 1 VIEW  COMPARISON:   None.  FINDINGS: Both kidneys are largely obscured by bowel contents. No definitive renal stones identified. Calcifications in the pelvis are nonspecific but may simply represent phleboliths. Moderate fecal loading in the colon. No other acute abnormalities.  IMPRESSION: Both kidneys are largely obscured by bowel contents. No definite renal or ureteral stones.  Electronically Signed   By: Dorise Bullion III M.D   On: 01/29/2020 16:47  Assessment  The primary encounter diagnosis was Chronic lower extremity pain (1ry area of Pain) (Bilateral) (R>L). Diagnoses of Chronic low back pain (2ry area of Pain) (Bilateral) (R>L), Chronic knee pain (3ry area of Pain) (Left), DDD (degenerative disc disease), lumbosacral, Lumbar foraminal stenosis (Multilevel) (Severe) (Right: L4-5; Left: L3-4), Compression fracture of L2 lumbar vertebra, sequela, and Abnormal MRI, lumbar spine (03/24/2019) were also pertinent to this visit.  Plan of Care  Problem-specific:  No problem-specific Assessment & Plan notes found for this encounter.  Mr. BRICEN VICTORY has a current medication list which includes the following long-term medication(s): albuterol, duloxetine, escitalopram, flecainide, methimazole, metoprolol succinate, nitroglycerin, omeprazole, and sildenafil.  Pharmacotherapy (Medications Ordered): No orders of the defined types were placed in this encounter.  Orders:  Orders Placed This Encounter  Procedures   Lumbar Transforaminal Epidural    Standing Status:   Future    Standing Expiration Date:   11/24/2020    Scheduling Instructions:     Side: Right-sided     Level: L4 & L5     Sedation: Patient's choice.     Timeframe: ASAA    Order Specific Question:   Where will this procedure be performed?  Answer:   [facility]   Informed Consent Details: Physician/Practitioner Attestation; Transcribe to consent form and obtain patient signature    Provider Attestation: I, [place]  Dossie Arbour, MD, (Pain Management Specialist), the physician/practitioner, attest that I have discussed with the patient the benefits, risks, side effects, alternatives, likelihood of achieving goals and potential problems during recovery for the procedure that I have provided informed consent.    Scheduling Instructions:     Note: Always confirm laterality of pain with Mr. Maynes, before procedure.     Transcribe to consent form and obtain patient signature.    Order Specific Question:   Physician/Practitioner attestation of informed consent for procedure/surgical case    Answer:   I, the physician/practitioner, attest that I have discussed with the patient the benefits, risks, side effects, alternatives, likelihood of achieving goals and potential problems during recovery for the procedure that I have provided informed consent.    Order Specific Question:   Procedure    Answer:   Diagnostic lumbar transforaminal epidural steroid injection under fluoroscopic guidance. (See notes for level and laterality.)    Order Specific Question:   Physician/Practitioner performing the procedure    Answer:   Dalyn Becker A. Dossie Arbour, MD    Order Specific Question:   Indication/Reason    Answer:   Lumbar radiculopathy/radiculitis associated with lumbar stenosis    Follow-up plan:   Return for (Clinic) procedure:, (NS) (R) L4 & L5 TFESI.      Considering:   Diagnostic bilateral lumbar facet blocks #1  Diagnostic left L3-4 LESI #1    Palliative PRN treatment(s):   Diagnostic right IA hip joint injection #2  Therapeutic/palliative right L3 and L4 TFESI #3  Palliative right L4-5 LESI #6  Palliative left L4-5 LESI #2  Palliative right L5-S1 LESI #3     Recent Visits No visits were found meeting these conditions. Showing recent visits within past 90 days and meeting all other requirements Today's Visits Date Type Provider Dept  10/25/20 Office Visit Milinda Pointer, MD [facility]  Showing today's  visits and meeting all other requirements Future Appointments No visits were found meeting these conditions. Showing future appointments within next 90 days and meeting all other requirements I discussed the assessment and treatment plan with the patient. The patient was provided an opportunity to ask questions and all were answered. The patient agreed with the plan and demonstrated an understanding of the instructions.  Patient advised to call back or seek an in-person evaluation if the symptoms or condition worsens.  Duration of encounter: 15 minutes.  Note by: Alan Cola, MD Date: 10/25/2020; Time: 2:40 PM

## 2020-10-25 ENCOUNTER — Ambulatory Visit: Payer: Medicare Other | Attending: Pain Medicine | Admitting: Pain Medicine

## 2020-10-25 ENCOUNTER — Other Ambulatory Visit: Payer: Self-pay

## 2020-10-25 DIAGNOSIS — M25562 Pain in left knee: Secondary | ICD-10-CM | POA: Diagnosis not present

## 2020-10-25 DIAGNOSIS — M51379 Other intervertebral disc degeneration, lumbosacral region without mention of lumbar back pain or lower extremity pain: Secondary | ICD-10-CM

## 2020-10-25 DIAGNOSIS — M5137 Other intervertebral disc degeneration, lumbosacral region: Secondary | ICD-10-CM | POA: Diagnosis not present

## 2020-10-25 DIAGNOSIS — M79604 Pain in right leg: Secondary | ICD-10-CM

## 2020-10-25 DIAGNOSIS — M48061 Spinal stenosis, lumbar region without neurogenic claudication: Secondary | ICD-10-CM

## 2020-10-25 DIAGNOSIS — G8929 Other chronic pain: Secondary | ICD-10-CM

## 2020-10-25 DIAGNOSIS — M5442 Lumbago with sciatica, left side: Secondary | ICD-10-CM | POA: Diagnosis not present

## 2020-10-25 DIAGNOSIS — R937 Abnormal findings on diagnostic imaging of other parts of musculoskeletal system: Secondary | ICD-10-CM

## 2020-10-25 DIAGNOSIS — M5441 Lumbago with sciatica, right side: Secondary | ICD-10-CM

## 2020-10-25 DIAGNOSIS — S32020S Wedge compression fracture of second lumbar vertebra, sequela: Secondary | ICD-10-CM

## 2020-10-26 ENCOUNTER — Telehealth: Payer: Self-pay

## 2020-10-26 NOTE — Telephone Encounter (Signed)
Attempted to call patient for pre procedure instructions.  He does not usually get sedation, but he needs to stop his Coumadin for 5 days prior to the procedure.  When you call to schedule, please notify him to stop his blood thinner or send back to the nurses for instructions.  Thanks

## 2020-11-11 NOTE — Progress Notes (Signed)
PROVIDER NOTE: Information contained herein reflects review and annotations entered in association with encounter. Interpretation of such information and data should be left to medically-trained personnel. Information provided to patient can be located elsewhere in the medical record under "Patient Instructions". Document created using STT-dictation technology, any transcriptional errors that may result from process are unintentional.    Patient: Alan Stafford  Service Category: Procedure  Provider: Oswaldo Done, MD  DOB: 07-Feb-1946  DOS: 11/14/2020  Location: [facility]  MRN: [id number]  Setting: Ambulatory - outpatient  Referring Provider: Delano Metz, MD  Type: Established Patient  Specialty: Interventional Pain Management  PCP: Lindwood Qua, MD   Primary Reason for Visit: Interventional Pain Management Treatment. CC: Back Pain    Procedure:          Anesthesia, Analgesia, Anxiolysis:  Type: Trans-Foraminal Epidural Steroid Injection          Purpose: Diagnostic/Therapeutic Region: Posterolateral Lumbosacral Target Area: The 6 o'clock position under the pedicle, on the affected side. Approach: Posterior Percutaneous Paravertebral approach. Level: L4 & L5 Level Laterality: Right       Type: Local Anesthesia Local Anesthetic: Lidocaine 1-2% Sedation: Minimal Anxiolysis  Indication(s): Anxiety & Analgesia Route: Infiltration (Apex/IM) IV Access: Available   Position: Prone   Indications: 1. Chronic lumbar radicular pain (Right)   2. 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 Lumbar spondylosis   4. Lumbosacral spinal stenosis (Multilevel)   5. DDD (degenerative disc disease), lumbosacral   6. Chronic lower extremity pain (1ry area of Pain) (Bilateral) (R>L)   7. 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 Chronic anticoagulation (Coumadin)    Pain Score: Pre-procedure:  5 /10 Post-procedure: 5 /10     Pre-op H&P Assessment:  [name] is a 75 y.o. (year old), male patient, seen today for interventional treatment. He  has a past surgical history that includes Knee surgery (Right, 2015); Eye surgery (Right, 2015); Joint replacement (Right); and Cataract extraction w/PHACO (Left, 09/26/2016). [name] has a current medication list which includes the following prescription(s): albuterol, aspirin ec, fifty50 glucose meter 2.0, budesonide-formoterol, duloxetine, enalapril, escitalopram, flecainide, glucose blood, glucose blood, glucose blood, hydrocodone-acetaminophen, metformin, methimazole, metoprolol succinate, nitroglycerin, omeprazole, phenazopyridine, sildenafil, sildenafil, trazodone, gemtesa, and warfarin, and the following Facility-Administered Medications: lactated ringers and midazolam. His primarily concern today is the Back Pain  Initial Vital Signs:  Pulse/HCG Rate: 64  Temp: (!) 97.4 F (36.3 C) Resp: 16 BP: 119/80 SpO2: 98 %  BMI: Estimated body mass index is 27.09 kg/m as calculated from the following:   Height as of this encounter: 6\' 2"  (1.88 m).   Weight as of this encounter: 211 lb (95.7 kg).  Risk Assessment: Allergies: Reviewed. He is allergic to statins and sulfa antibiotics.  Allergy Precautions: None required Coagulopathies: Reviewed. None identified.  Blood-thinner therapy: Stopped using ASA guidelines Active Infection(s): Reviewed. None identified. [name] is afebrile  Site Confirmation: [name] was asked to confirm the procedure and laterality before marking the site Procedure checklist: Completed Consent: Before the procedure and under the influence of no sedative(s), amnesic(s), or anxiolytics, the patient was informed of the treatment options, risks and possible complications. To fulfill our ethical and legal obligations, as recommended by the American Medical Association's Code of Ethics, I have informed the patient of my  clinical impression; the nature and purpose of the treatment or procedure; the risks, benefits, and possible complications  of the intervention; the alternatives, including doing nothing; the risk(s) and benefit(s) of the alternative treatment(s) or procedure(s); and the risk(s) and benefit(s) of doing nothing. The patient was provided information about the general risks and possible complications associated with the procedure. These may include, but are not limited to: failure to achieve desired goals, infection, bleeding, organ or nerve damage, allergic reactions, paralysis, and death. In addition, the patient was informed of those risks and complications associated to Spine-related procedures, such as failure to decrease pain; infection (i.e.: Meningitis, epidural or intraspinal abscess); bleeding (i.e.: epidural hematoma, subarachnoid hemorrhage, or any other type of intraspinal or peri-dural bleeding); organ or nerve damage (i.e.: Any type of peripheral nerve, nerve root, or spinal cord injury) with subsequent damage to sensory, motor, and/or autonomic systems, resulting in permanent pain, numbness, and/or weakness of one or several areas of the body; allergic reactions; (i.e.: anaphylactic reaction); and/or death. Furthermore, the patient was informed of those risks and complications associated with the medications. These include, but are not limited to: allergic reactions (i.e.: anaphylactic or anaphylactoid reaction(s)); adrenal axis suppression; blood sugar elevation that in diabetics may result in ketoacidosis or comma; water retention that in patients with history of congestive heart failure may result in shortness of breath, pulmonary edema, and decompensation with resultant heart failure; weight gain; swelling or edema; medication-induced neural toxicity; particulate matter embolism and blood vessel occlusion with resultant organ, and/or nervous system infarction; and/or aseptic necrosis of one or  more joints. Finally, the patient was informed that Medicine is not an exact science; therefore, there is also the possibility of unforeseen or unpredictable risks and/or possible complications that may result in a catastrophic outcome. The patient indicated having understood very clearly. We have given the patient no guarantees and we have made no promises. Enough time was given to the patient to ask questions, all of which were answered to the patient's satisfaction. Mr. Clopper has indicated that he wanted to continue with the procedure. Attestation: I, the ordering provider, attest that I have discussed with the patient the benefits, risks, side-effects, alternatives, likelihood of achieving goals, and potential problems during recovery for the procedure that I have provided informed consent. Date  Time: 11/14/2020 12:42 PM  Pre-Procedure Preparation:  Monitoring: As per clinic protocol. Respiration, ETCO2, SpO2, BP, heart rate and rhythm monitor placed and checked for adequate function Safety Precautions: Patient was assessed for positional comfort and pressure points before starting the procedure. Time-out: I initiated and conducted the "Time-out" before starting the procedure, as per protocol. The patient was asked to participate by confirming the accuracy of the "Time Out" information. Verification of the correct person, site, and procedure were performed and confirmed by me, the nursing staff, and the patient. "Time-out" conducted as per Joint Commission's Universal Protocol (UP.01.01.01). Time: 1305  Description of Procedure:          Area Prepped: Entire Posterior Lumbosacral Area DuraPrep (Iodine Povacrylex [0.7% available iodine] and Isopropyl Alcohol, 74% w/w) Safety Precautions: Aspiration looking for blood return was conducted prior to all injections. At no point did we inject any substances, as a needle was being advanced. No attempts were made at seeking any paresthesias. Safe injection  practices and needle disposal techniques used. Medications properly checked for expiration dates. SDV (single dose vial) medications used. Description of the Procedure: Protocol guidelines were followed. The patient was placed in position over the procedure table. The target area was identified and the area prepped in the usual manner. Skin & deeper  tissues infiltrated with local anesthetic. Appropriate amount of time allowed to pass for local anesthetics to take effect. The procedure needles were then advanced to the target area. Proper needle placement secured. Negative aspiration confirmed. Solution injected in intermittent fashion, asking for systemic symptoms every 0.5cc of injectate. The needles were then removed and the area cleansed, making sure to leave some of the prepping solution back to take advantage of its long term bactericidal properties.  Vitals:   11/14/20 1300 11/14/20 1305 11/14/20 1310 11/14/20 1315  BP: (!) 148/73 138/72 127/73 (!) 144/86  Pulse: 66 63 64 64  Resp: 16 17 16 15   Temp:      SpO2: 98% 98% 97% 98%  Weight:      Height:        Start Time: 1305 hrs. End Time: 1314 hrs.  Materials:  Needle(s) Type: Spinal Needle Gauge: 22G Length: 3.5-in Medication(s): Please see orders for medications and dosing details.  Imaging Guidance (Spinal):          Type of Imaging Technique: Fluoroscopy Guidance (Spinal) Indication(s): Assistance in needle guidance and placement for procedures requiring needle placement in or near specific anatomical locations not easily accessible without such assistance. Exposure Time: Please see nurses notes. Contrast: Before injecting any contrast, we confirmed that the patient did not have an allergy to iodine, shellfish, or radiological contrast. Once satisfactory needle placement was completed at the desired level, radiological contrast was injected. Contrast injected under live fluoroscopy. No contrast complications. See chart for type and  volume of contrast used. Fluoroscopic Guidance: I was personally present during the use of fluoroscopy. "Tunnel Vision Technique" used to obtain the best possible view of the target area. Parallax error corrected before commencing the procedure. "Direction-depth-direction" technique used to introduce the needle under continuous pulsed fluoroscopy. Once target was reached, antero-posterior, oblique, and lateral fluoroscopic projection used confirm needle placement in all planes. Images permanently stored in EMR. Interpretation: I personally interpreted the imaging intraoperatively. Adequate needle placement confirmed in multiple planes. Appropriate spread of contrast into desired area was observed. No evidence of afferent or efferent intravascular uptake. No intrathecal or subarachnoid spread observed. Permanent images saved into the patient's record.  Antibiotic Prophylaxis:   Anti-infectives (From admission, onward)    None      Indication(s): None identified  Post-operative Assessment:  Post-procedure Vital Signs:  Pulse/HCG Rate: 64 (nsr)  Temp: (!) 97.4 F (36.3 C) Resp: 15 BP: (!) 144/86 SpO2: 98 %  EBL: None  Complications: No immediate post-treatment complications observed by team, or reported by patient.  Note: The patient tolerated the entire procedure well. A repeat set of vitals were taken after the procedure and the patient was kept under observation following institutional policy, for this type of procedure. Post-procedural neurological assessment was performed, showing return to baseline, prior to discharge. The patient was provided with post-procedure discharge instructions, including a section on how to identify potential problems. Should any problems arise concerning this procedure, the patient was given instructions to immediately contact , at any time, without hesitation. In any case, we plan to contact the patient by telephone for a follow-up status report regarding  this interventional procedure.  Comments:  No additional relevant information.  Plan of Care  Orders:  Orders Placed This Encounter  Procedures   Lumbar Transforaminal Epidural    Scheduling Instructions:     Side: Right-sided     Level: L4 & L5     Sedation: Patient's choice.     Timeframe:  Today    Order Specific Question:   Where will this procedure be performed?    Answer:   [facility]   DG [facility] C-ARM 1-60 MIN NO REPORT    Intraoperative interpretation by procedural physician at [facility].    Standing Status:   Standing    Number of Occurrences:   1    Order Specific Question:   Reason for exam:    Answer:   Assistance in needle guidance and placement for procedures requiring needle placement in or near specific anatomical locations not easily accessible without such assistance.   Informed Consent Details: Physician/Practitioner Attestation; Transcribe to consent form and obtain patient signature    Provider Attestation: I, Carless Slatten A. Laban Emperor, MD, (Pain Management Specialist), the physician/practitioner, attest that I have discussed with the patient the benefits, risks, side effects, alternatives, likelihood of achieving goals and potential problems during recovery for the procedure that I have provided informed consent.    Scheduling Instructions:     Note: Always confirm laterality of pain with Mr. Sanden, before procedure.     Transcribe to consent form and obtain patient signature.    Order Specific Question:   Physician/Practitioner attestation of informed consent for procedure/surgical case    Answer:   I, the physician/practitioner, attest that I have discussed with the patient the benefits, risks, side effects, alternatives, likelihood of achieving goals and potential problems during recovery for the procedure that I have provided informed consent.    Order Specific Question:   Procedure    Answer:   Diagnostic lumbar transforaminal epidural  steroid injection under fluoroscopic guidance. (See notes for level and laterality.)    Order Specific Question:   Physician/Practitioner performing the procedure    Answer:   Jamae Tison A. Laban Emperor, MD    Order Specific Question:   Indication/Reason    Answer:   Lumbar radiculopathy/radiculitis associated with lumbar stenosis   Care order/instruction: Please confirm that the patient has stopped the Coumadin (Warfarin) X 5 days prior to procedure or surgery.    Please confirm that the patient has stopped the Coumadin (Warfarin) X 5 days prior to procedure or surgery.    Standing Status:   Standing    Number of Occurrences:   1   Provide equipment / supplies at bedside    "Block Tray" (Disposable  single use) Needle type: SpinalSpinal Amount/quantity: 2 Size: Medium (5-inch) Gauge: 22G    Standing Status:   Standing    Number of Occurrences:   1    Order Specific Question:   Specify    Answer:   Block Tray   Bleeding precautions    Standing Status:   Standing    Number of Occurrences:   1    Chronic Opioid Analgesic:  Hydrocodone/APAP 10/325, 1 tab PO q 6 hrs (40 mg/day of hydrocodone) (not prescribed by our practice) MME: 40 mg/day.   Medications ordered for procedure: Meds ordered this encounter  Medications   iohexol (OMNIPAQUE) 180 MG/ML injection 10 mL    Must be Myelogram-compatible. If not available, you may substitute with a water-soluble, non-ionic, hypoallergenic, myelogram-compatible radiological contrast medium.   lidocaine (XYLOCAINE) 2 % (with pres) injection 400 mg   pentafluoroprop-tetrafluoroeth (GEBAUERS) aerosol   lactated ringers infusion 1,000 mL   midazolam (VERSED) 5 MG/5ML injection 0.5-2 mg    Make sure Flumazenil is available in the pyxis when using this medication. If oversedation occurs, administer 0.2 mg IV over 15 sec. If after 45 sec  no response, administer 0.2 mg again over 1 min; may repeat at 1 min intervals; not to exceed 4 doses (1 mg)   sodium  chloride flush (NS) 0.9 % injection 2 mL   ropivacaine (PF) 2 mg/mL (0.2%) (NAROPIN) injection 2 mL   dexamethasone (DECADRON) injection 20 mg    Medications administered: We administered iohexol, lidocaine, pentafluoroprop-tetrafluoroeth, sodium chloride flush, ropivacaine (PF) 2 mg/mL (0.2%), and dexamethasone.  See the medical record for exact dosing, route, and time of administration.  Follow-up plan:   Return in about 2 weeks (around 11/28/2020) for Proc-day (T,Th), (VV), (PPE).        Considering:   Diagnostic bilateral lumbar facet blocks #1  Diagnostic left L3-4 LESI #1    Palliative PRN treatment(s):   Diagnostic right IA hip joint injection #2  Therapeutic/palliative right L3 and L4 TFESI #3  Palliative right L4-5 LESI #6  Palliative left L4-5 LESI #2  Palliative right L5-S1 LESI #3      Recent Visits Date Type Provider Dept  10/25/20 Office Visit Delano Metz, MD [facility]  Showing recent visits within past 90 days and meeting all other requirements Today's Visits Date Type Provider Dept  11/14/20 Appointment Delano Metz, MD [facility]  11/14/20 Procedure visit Delano Metz, MD [facility]  Showing today's visits and meeting all other requirements Future Appointments No visits were found meeting these conditions. Showing future appointments within next 90 days and meeting all other requirements Disposition: Discharge home  Discharge (Date  Time): 11/14/2020; 1318 hrs.   Primary Care Physician: Lindwood Qua, MD Location: [facility] Note by: Oswaldo Done, MD Date: 11/14/2020; Time: 2:00 PM  Disclaimer:  Medicine is not an Visual merchandiser. The only guarantee in medicine is that nothing is guaranteed. It is important to note that the decision to proceed with this intervention was based on the information collected from the patient. The Data and conclusions were drawn from the  patient's questionnaire, the interview, and the physical examination. Because the information was provided in large part by the patient, it cannot be guaranteed that it has not been purposely or unconsciously manipulated. Every effort has been made to obtain as much relevant data as possible for this evaluation. It is important to note that the conclusions that lead to this procedure are derived in large part from the available data. Always take into account that the treatment will also be dependent on availability of resources and existing treatment guidelines, considered by other Pain Management Practitioners as being common knowledge and practice, at the time of the intervention. For Medico-Legal purposes, it is also important to point out that variation in procedural techniques and pharmacological choices are the acceptable norm. The indications, contraindications, technique, and results of the above procedure should only be interpreted and judged by a Board-Certified Interventional Pain Specialist with extensive familiarity and expertise in the same exact procedure and technique.

## 2020-11-14 ENCOUNTER — Ambulatory Visit (HOSPITAL_BASED_OUTPATIENT_CLINIC_OR_DEPARTMENT_OTHER): Payer: Medicare Other | Admitting: Pain Medicine

## 2020-11-14 ENCOUNTER — Ambulatory Visit
Admission: RE | Admit: 2020-11-14 | Discharge: 2020-11-14 | Disposition: A | Discharge status: Home / Self Care | Payer: Medicare Other | Source: Ambulatory Visit | Attending: Pain Medicine | Admitting: Pain Medicine

## 2020-11-14 ENCOUNTER — Telehealth: Payer: Medicare Other | Admitting: Pain Medicine

## 2020-11-14 ENCOUNTER — Other Ambulatory Visit: Payer: Self-pay

## 2020-11-14 ENCOUNTER — Encounter: Payer: Self-pay | Admitting: Pain Medicine

## 2020-11-14 VITALS — BP 144/86 | HR 64 | Temp 97.4°F | Resp 15 | Ht 74.0 in | Wt 211.0 lb

## 2020-11-14 DIAGNOSIS — R937 Abnormal findings on diagnostic imaging of other parts of musculoskeletal system: Secondary | ICD-10-CM | POA: Diagnosis present

## 2020-11-14 DIAGNOSIS — M4807 Spinal stenosis, lumbosacral region: Secondary | ICD-10-CM | POA: Diagnosis present

## 2020-11-14 DIAGNOSIS — M5137 Other intervertebral disc degeneration, lumbosacral region: Secondary | ICD-10-CM | POA: Diagnosis present

## 2020-11-14 DIAGNOSIS — M48061 Spinal stenosis, lumbar region without neurogenic claudication: Secondary | ICD-10-CM | POA: Diagnosis present

## 2020-11-14 DIAGNOSIS — M5416 Radiculopathy, lumbar region: Secondary | ICD-10-CM | POA: Insufficient documentation

## 2020-11-14 DIAGNOSIS — M47816 Spondylosis without myelopathy or radiculopathy, lumbar region: Secondary | ICD-10-CM

## 2020-11-14 DIAGNOSIS — M79604 Pain in right leg: Secondary | ICD-10-CM | POA: Insufficient documentation

## 2020-11-14 DIAGNOSIS — G8929 Other chronic pain: Secondary | ICD-10-CM | POA: Insufficient documentation

## 2020-11-14 DIAGNOSIS — M5442 Lumbago with sciatica, left side: Secondary | ICD-10-CM | POA: Insufficient documentation

## 2020-11-14 DIAGNOSIS — M51379 Other intervertebral disc degeneration, lumbosacral region without mention of lumbar back pain or lower extremity pain: Secondary | ICD-10-CM

## 2020-11-14 DIAGNOSIS — Z7901 Long term (current) use of anticoagulants: Secondary | ICD-10-CM | POA: Diagnosis present

## 2020-11-14 DIAGNOSIS — M5441 Lumbago with sciatica, right side: Secondary | ICD-10-CM | POA: Diagnosis present

## 2020-11-14 MED ORDER — SODIUM CHLORIDE 0.9% FLUSH
2.0000 mL | Freq: Once | INTRAVENOUS | Status: AC
  Administered 2020-11-14: 2 mL

## 2020-11-14 MED ORDER — DEXAMETHASONE SODIUM PHOSPHATE 10 MG/ML IJ SOLN
INTRAMUSCULAR | Status: AC
  Filled 2020-11-14: qty 1

## 2020-11-14 MED ORDER — ROPIVACAINE HCL 2 MG/ML IJ SOLN
INTRAMUSCULAR | Status: AC
  Filled 2020-11-14: qty 20

## 2020-11-14 MED ORDER — MIDAZOLAM HCL 5 MG/5ML IJ SOLN: 0.5000 mg | Freq: Once | INTRAMUSCULAR | Status: DC

## 2020-11-14 MED ORDER — ROPIVACAINE HCL 2 MG/ML IJ SOLN
2.0000 mL | Freq: Once | INTRAMUSCULAR | Status: AC
  Administered 2020-11-14: 2 mL via EPIDURAL

## 2020-11-14 MED ORDER — DEXAMETHASONE SODIUM PHOSPHATE 10 MG/ML IJ SOLN
20.0000 mg | Freq: Once | INTRAMUSCULAR | Status: AC
  Administered 2020-11-14: 20 mg
  Filled 2020-11-14: qty 2

## 2020-11-14 MED ORDER — IOHEXOL 180 MG/ML  SOLN
10.0000 mL | Freq: Once | INTRAMUSCULAR | Status: AC
  Administered 2020-11-14: 5 mL via EPIDURAL

## 2020-11-14 MED ORDER — LACTATED RINGERS IV SOLN: 1000.0000 mL | Freq: Once | INTRAVENOUS | Status: DC

## 2020-11-14 MED ORDER — LIDOCAINE HCL 2 % IJ SOLN
20.0000 mL | Freq: Once | INTRAMUSCULAR | Status: AC
  Administered 2020-11-14: 200 mg
  Filled 2020-11-14: qty 20

## 2020-11-14 MED ORDER — LIDOCAINE HCL (PF) 2 % IJ SOLN
INTRAMUSCULAR | Status: AC
  Filled 2020-11-14: qty 10

## 2020-11-14 MED ORDER — PENTAFLUOROPROP-TETRAFLUOROETH EX AERO
INHALATION_SPRAY | Freq: Once | CUTANEOUS | Status: AC
  Administered 2020-11-14: 30 via TOPICAL
  Filled 2020-11-14: qty 116

## 2020-11-14 MED ORDER — SODIUM CHLORIDE (PF) 0.9 % IJ SOLN
INTRAMUSCULAR | Status: AC
  Filled 2020-11-14: qty 10

## 2020-11-14 NOTE — Patient Instructions (Signed)

## 2020-11-14 NOTE — Progress Notes (Signed)
Safety precautions to be maintained throughout the outpatient stay will include: orient to surroundings, keep bed in low position, maintain call bell within reach at all times, provide assistance with transfer out of bed and ambulation.  

## 2020-11-15 ENCOUNTER — Telehealth: Payer: Self-pay

## 2020-11-15 NOTE — Telephone Encounter (Signed)
Post procedure phone call.   No answer.  

## 2020-12-05 ENCOUNTER — Ambulatory Visit: Payer: Medicare Other | Attending: Pain Medicine | Admitting: Pain Medicine

## 2020-12-05 ENCOUNTER — Other Ambulatory Visit: Payer: Self-pay

## 2020-12-05 DIAGNOSIS — M5416 Radiculopathy, lumbar region: Secondary | ICD-10-CM | POA: Diagnosis not present

## 2020-12-05 DIAGNOSIS — M4807 Spinal stenosis, lumbosacral region: Secondary | ICD-10-CM

## 2020-12-05 DIAGNOSIS — M48061 Spinal stenosis, lumbar region without neurogenic claudication: Secondary | ICD-10-CM | POA: Diagnosis not present

## 2020-12-05 DIAGNOSIS — M5442 Lumbago with sciatica, left side: Secondary | ICD-10-CM

## 2020-12-05 DIAGNOSIS — G8929 Other chronic pain: Secondary | ICD-10-CM

## 2020-12-05 DIAGNOSIS — M79604 Pain in right leg: Secondary | ICD-10-CM

## 2020-12-05 DIAGNOSIS — M5441 Lumbago with sciatica, right side: Secondary | ICD-10-CM

## 2020-12-05 NOTE — Progress Notes (Signed)
Patient: Alan Stafford  Service Category: E/M  Provider: Gaspar Cola, MD  DOB: 12-21-1945  DOS: 12/05/2020  Location: Office  MRN: [id number]  Setting: Ambulatory outpatient  Referring Provider: Raelene Bott, MD  Type: Established Patient  Specialty: Interventional Pain Management  PCP: Raelene Bott, MD  Location: Remote location  Delivery: TeleHealth     Virtual Encounter - Pain Management PROVIDER NOTE: Information contained herein reflects review and annotations entered in association with encounter. Interpretation of such information and data should be left to medically-trained personnel. Information provided to patient can be located elsewhere in the medical record under "Patient Instructions". Document created using STT-dictation technology, any transcriptional errors that may result from process are unintentional.    Contact & Pharmacy Preferred: [phone] Home: [phone] (home) Mobile: There is no such number on file (mobile). E-mail: [email or web address]  [id number] [address], [place] [address] Phone: [phone] Fax: [facility] [phone] [address], [place] [address] Phone: [phone] Fax: [phone]   Pre-screening  Alan Stafford offered "in-person" vs "virtual" encounter. He indicated preferring virtual for this encounter.   Reason COVID-19*  Social distancing based on CDC and AMA recommendations.   I contacted Alan Stafford on 12/05/2020 via telephone.      I clearly identified myself as Gaspar Cola, MD. I verified that I was speaking with the correct person using two identifiers (Name: Alan Stafford, and date of birth: 1945/03/02).  Consent I sought verbal advanced consent from Alan Stafford for virtual visit interactions. I informed Alan Stafford of possible security and privacy concerns, risks, and  limitations associated with providing "not-in-person" medical evaluation and management services. I also informed Alan Stafford of the availability of "in-person" appointments. Finally, I informed him that there would be a charge for the virtual visit and that he could be  personally, fully or partially, financially responsible for it. Alan Stafford expressed understanding and agreed to proceed.   Historic Elements   Alan Stafford is a 75 y.o. year old, male patient evaluated today after our last contact on 11/14/2020. Alan Stafford  has a past medical history of A-fib [facility]), Abnormal heart rhythm, Arthritis, Asthma, CAP (community acquired pneumonia) (04/22/2016), Diabetes mellitus without complication ([place]), Dysrhythmia, GERD (gastroesophageal reflux disease), Hemoptysis (04/22/2016), Hypercholesteremia, Hypertension, Hyperthyroidism, Kidney stones, and Sepsis ([place]) (04/22/2016). He also  has a past surgical history that includes Knee surgery (Right, 2015); Eye surgery (Right, 2015); Joint replacement (Right); and Cataract extraction w/PHACO (Left, 09/26/2016). Alan Stafford has a current medication list which includes the following prescription(s): albuterol, aspirin ec, fifty50 glucose meter 2.0, budesonide-formoterol, duloxetine, enalapril, escitalopram, flecainide, glucose blood, glucose blood, glucose blood, hydrocodone-acetaminophen, metformin, methimazole, metoprolol succinate, nitroglycerin, omeprazole, phenazopyridine, sildenafil, sildenafil, trazodone, gemtesa, and warfarin. He  reports that he has never smoked. He uses smokeless tobacco. He reports that he does not drink alcohol and does not use drugs. Alan Stafford is allergic to statins and sulfa antibiotics.   HPI  Today, he is being contacted for a post-procedure assessment.  Post-Procedure Evaluation  Procedure (11/14/2020):  Procedure:           Anesthesia, Analgesia, Anxiolysis:  Type: Trans-Foraminal Epidural Steroid Injection          Purpose:  Diagnostic/Therapeutic Region: Posterolateral Lumbosacral Target Area: The 6 o'clock position under the pedicle, on the affected side. Approach: Posterior Percutaneous  Paravertebral approach. Level: L4 & L5 Level Laterality: Right        Type: Local Anesthesia Local Anesthetic: Lidocaine 1-2% Sedation: Minimal Anxiolysis  Indication(s): Anxiety & Analgesia Route: Infiltration ([place]/IM) IV Access: Available     Position: Prone    Indications: 1. Chronic lumbar radicular pain (Right)   2. 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 Lumbar spondylosis   4. Lumbosacral spinal stenosis (Multilevel)   5. DDD (degenerative disc disease), lumbosacral   6. Chronic lower extremity pain (1ry area of Pain) (Bilateral) (R>L)   7. 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 Chronic anticoagulation (Coumadin)     Pain Score: Pre-procedure: 5 /10 Post-procedure: 5 /10     Anxiolysis: Please see nurses note.  Effectiveness during initial hour after procedure (Ultra-Short Term Relief): 80 %.  Local anesthetic used: Long-acting (4-6 hours) Effectiveness: Defined as any analgesic benefit obtained secondary to the administration of local anesthetics. This carries significant diagnostic value as to the etiological location, or anatomical origin, of the pain. Duration of benefit is expected to coincide with the duration of the local anesthetic used.  Effectiveness during initial 4-6 hours after procedure (Short-Term Relief): 80 %.  Long-term benefit: Defined as any relief past the pharmacologic duration of the local anesthetics.  Effectiveness past the initial 6 hours after procedure (Long-Term Relief): 80 % (procedure improved right hip pain but back continues to have back pain.  it is somewhat improved but not completely.).  Benefits, current: Defined as benefit present at the time of this evaluation.   Analgesia: The  patient refers having an ongoing 80% improvement of his right hip and back pain.  He says that it seems to slowly be getting better.  The low back pain component may be secondary to his facet joints and today I have offered to inject those if the pain continues.  He refers that he is currently doing okay and that he will let me know if it worsens. Function: Alan Stafford reports improvement in function ROM: Alan Stafford reports improvement in ROM   Pharmacotherapy Assessment   Analgesic: Hydrocodone/APAP 10/325, 1 tab PO q 6 hrs (40 mg/day of hydrocodone) (not prescribed by our practice) MME: 40 mg/day.   Monitoring: [place] PMP: PDMP not reviewed this encounter.       Pharmacotherapy: No side-effects or adverse reactions reported. Compliance: No problems identified. Effectiveness: Clinically acceptable. Plan: Refer to "POC". UDS: No results found for: SUMMARY   Laboratory Chemistry Profile   Renal Lab Results  Component Value Date   BUN 48 (H) 05/23/2016   CREATININE 1.83 (H) 05/23/2016   GFRAA 41 (L) 05/23/2016   GFRNONAA 36 (L) 05/23/2016    Hepatic Lab Results  Component Value Date   AST 53 (H) 05/18/2016   ALT 37 05/18/2016   ALBUMIN 3.3 (L) 05/18/2016   ALKPHOS 72 05/18/2016    Electrolytes Lab Results  Component Value Date   NA 135 05/23/2016   K 5.2 (H) 05/23/2016   CL 101 05/23/2016   CALCIUM 8.6 (L) 05/23/2016   MG 2.2 05/20/2016   PHOS 5.1 (H) 05/20/2016    Bone No results found for: VD25OH, [id number], [id number], [id number], 25OHVITD1, 25OHVITD2, 25OHVITD3, TESTOFREE, TESTOSTERONE  Inflammation (CRP: Acute Phase) (ESR: Chronic Phase) Lab Results  Component Value Date   ESRSEDRATE 5 05/20/2016   LATICACIDVEN 1.4 04/22/2016         Note: Above Lab results reviewed.  Imaging  DG PAIN CLINIC C-ARM 1-60 MIN NO REPORT Fluoro was used, but no Radiologist interpretation will be provided.  Please refer to "NOTES" tab for provider progress note.  Assessment  The  primary encounter diagnosis was Chronic lower extremity pain (1ry area of Pain) (Bilateral) (R>L). Diagnoses of Chronic low back pain (2ry area of Pain) (Bilateral) (R>L) w/ sciatica (Bilateral), Chronic lumbar radicular pain (Right), Lumbar foraminal stenosis (Multilevel) (Severe) (Right: L4-5; Left: L3-4), and Lumbosacral spinal stenosis (Multilevel) were also pertinent to this visit.  Plan of Care  Problem-specific:  No problem-specific Assessment & Plan notes found for this encounter.  Alan Stafford has a current medication list which includes the following long-term medication(s): albuterol, duloxetine, escitalopram, flecainide, methimazole, metoprolol succinate, nitroglycerin, omeprazole, and sildenafil.  Pharmacotherapy (Medications Ordered): No orders of the defined types were placed in this encounter.  Orders:  No orders of the defined types were placed in this encounter.  Follow-up plan:   No follow-ups on file.     Interventional Therapies  Risk  Complexity Considerations:   Estimated body mass index is 27.09 kg/m as calculated from the following:   Height as of 11/14/20: 6' 2"  (1.88 m).   Weight as of 11/14/20: 211 lb (95.7 kg). WNL   Planned  Pending:   Pending further evaluation   Under consideration:   Diagnostic bilateral lumbar facet blocks #1  Diagnostic left L3-4 LESI #1    Completed:   Diagnostic right IA hip joint injection x1  Therapeutic/palliative right L3 and L4 TFESI x2  Therapeutic right L4 and L5 TFESI x1 (11/14/2020) (80/80/80/80) Palliative right L4-5 LESI x5  Palliative left L4-5 LESI x1  Palliative right L5-S1 LESI x2    Therapeutic  Palliative (PRN) options:   Diagnostic right IA hip joint injection #2  Therapeutic/palliative right L3 and L4 TFESI #3  Palliative right L4-5 LESI #6  Palliative left L4-5 LESI #2  Palliative right L5-S1 LESI #3     Recent Visits Date Type Provider Dept  11/14/20 Procedure visit Milinda Pointer, MD  [facility]  10/25/20 Office Visit Milinda Pointer, MD [facility]  Showing recent visits within past 90 days and meeting all other requirements Today's Visits Date Type Provider Dept  12/05/20 Office Visit Milinda Pointer, MD [facility]  Showing today's visits and meeting all other requirements Future Appointments No visits were found meeting these conditions. Showing future appointments within next 90 days and meeting all other requirements I discussed the assessment and treatment plan with the patient. The patient was provided an opportunity to ask questions and all were answered. The patient agreed with the plan and demonstrated an understanding of the instructions.  Patient advised to call back or seek an in-person evaluation if the symptoms or condition worsens.  Duration of encounter: 12 minutes.  Note by: Gaspar Cola, MD Date: 12/05/2020; Time: 5:21 PM

## 2021-01-25 ENCOUNTER — Encounter (INDEPENDENT_AMBULATORY_CARE_PROVIDER_SITE_OTHER): Payer: Self-pay | Admitting: Nurse Practitioner

## 2021-01-25 ENCOUNTER — Encounter (INDEPENDENT_AMBULATORY_CARE_PROVIDER_SITE_OTHER): Payer: Self-pay

## 2021-02-09 ENCOUNTER — Encounter (INDEPENDENT_AMBULATORY_CARE_PROVIDER_SITE_OTHER): Payer: Self-pay | Admitting: Nurse Practitioner

## 2021-02-09 ENCOUNTER — Encounter (INDEPENDENT_AMBULATORY_CARE_PROVIDER_SITE_OTHER): Payer: Self-pay

## 2021-02-23 ENCOUNTER — Other Ambulatory Visit: Payer: Self-pay | Admitting: Urology

## 2021-02-23 DIAGNOSIS — N529 Male erectile dysfunction, unspecified: Secondary | ICD-10-CM

## 2021-02-28 ENCOUNTER — Other Ambulatory Visit: Payer: Self-pay | Admitting: Urology

## 2021-02-28 DIAGNOSIS — N529 Male erectile dysfunction, unspecified: Secondary | ICD-10-CM

## 2021-04-12 IMAGING — MR MR LUMBAR SPINE W/O CM
5 series · 30 of 48 positions shown · non-contrast
Comparison: MRI 09/30/2016

CLINICAL DATA: Low back and right hip and leg pain for 4-5 months.

EXAM:
MRI LUMBAR SPINE WITHOUT CONTRAST
TECHNIQUE: Multiplanar, multisequence MR imaging of the lumbar spine was
performed. No intravenous contrast was administered.

[Series 5: T2 · sagittal · 4.0mm · 0.81mm/px · 6 of 18 slices shown (1 of 2)]
[im 1/18]
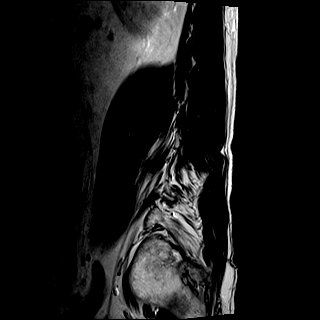
[im 4/18]
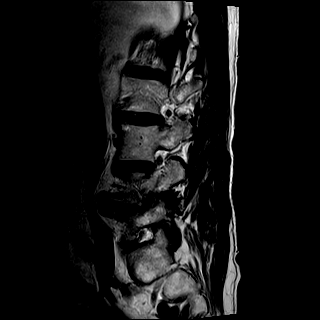
[im 7/18]
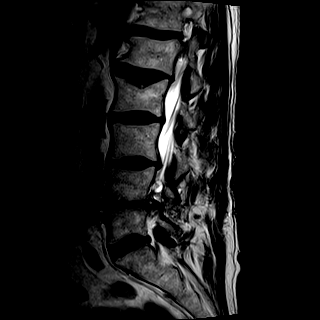
[im 11/18]
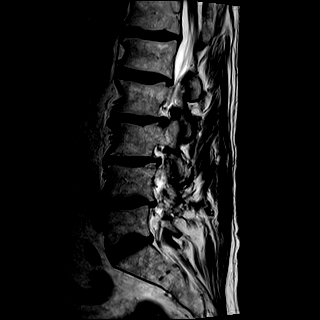
[im 14/18]
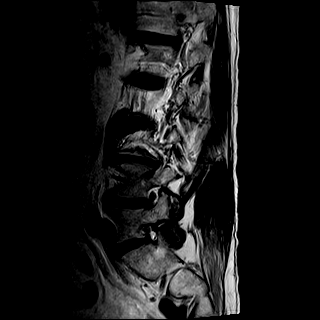
[im 18/18]
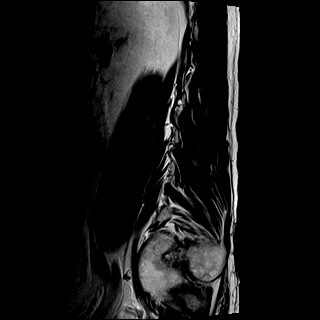

[Series 6: T1 · sagittal · 4.0mm · 0.81mm/px · 7 of 18 slices shown (1 of 2)]
[im 1/18]
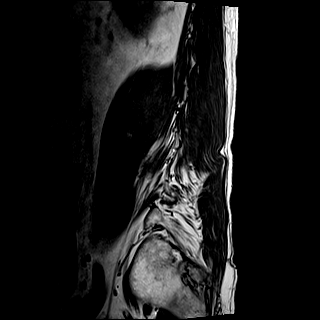
[im 3/18]
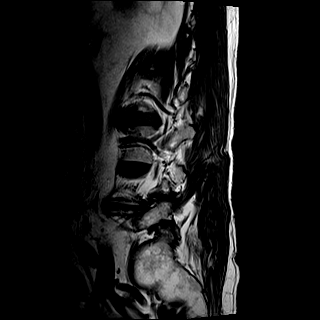
[im 6/18]
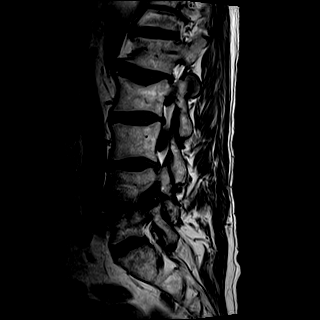
[im 9/18]
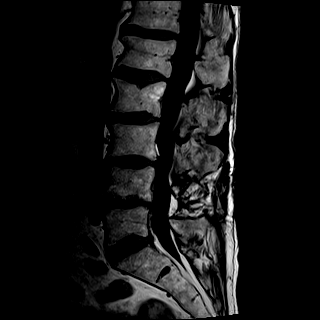
[im 12/18]
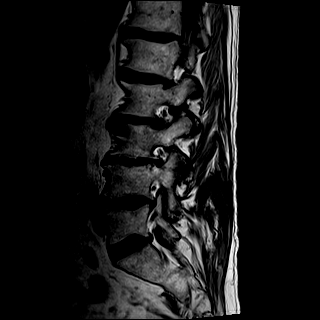
[im 15/18]
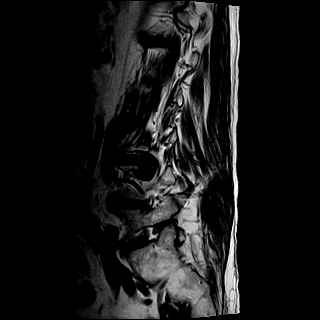
[im 18/18]
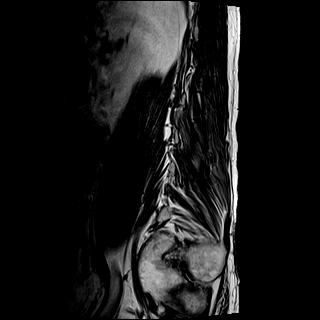

[Series 7: STIR · sagittal · 4.0mm · 0.41mm/px · 1 of 18 slices shown]
[im 1/18]
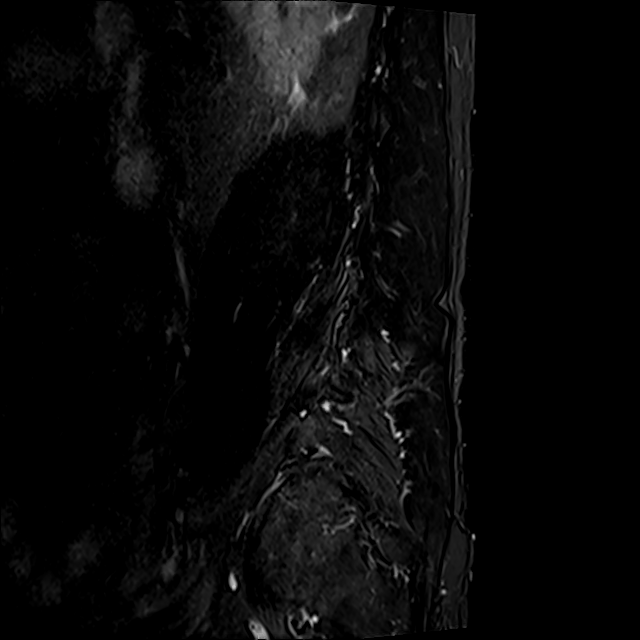

[Series 8: T2 · axial · 4.0mm · 0.78mm/px · z∈[-124,+87]mm · 8 of 38 slices shown (2 of 2)]
[im 1/38]
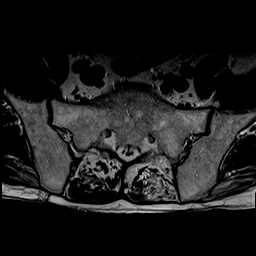
[im 6/38]
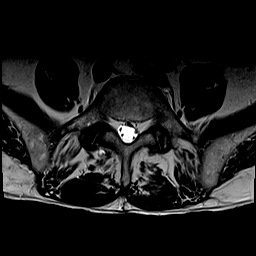
[im 12/38]
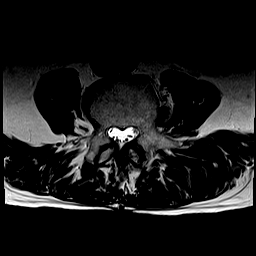
[im 18/38]
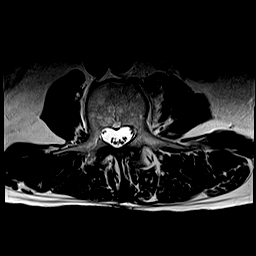
[im 20/38]
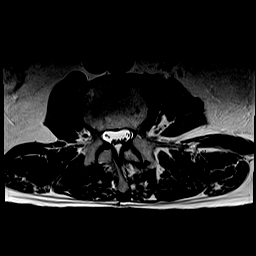
[im 26/38]
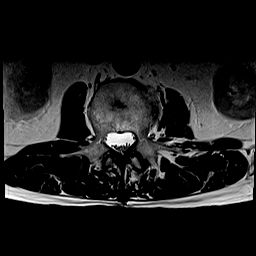
[im 32/38]
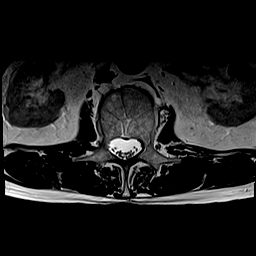
[im 38/38]
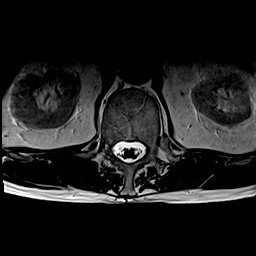

[Series 9: T1 · axial · 4.0mm · 0.39mm/px · z∈[-124,+87]mm · 8 of 38 slices shown (2 of 2)]
[im 1/38]
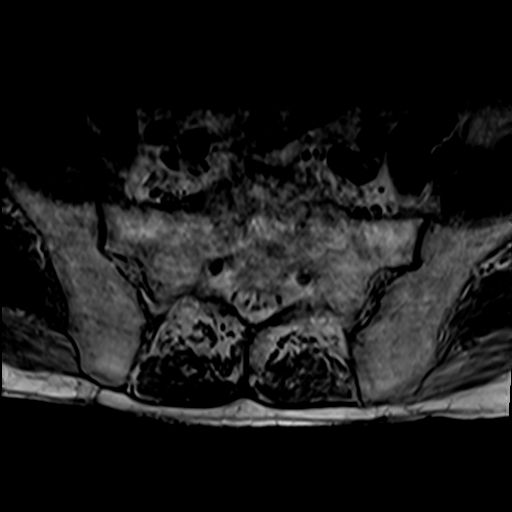
[im 6/38]
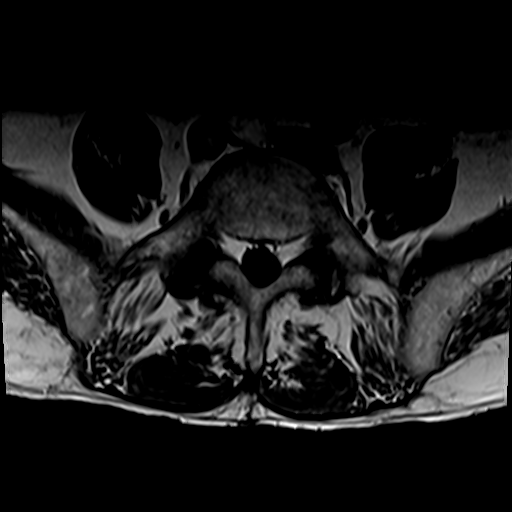
[im 12/38]
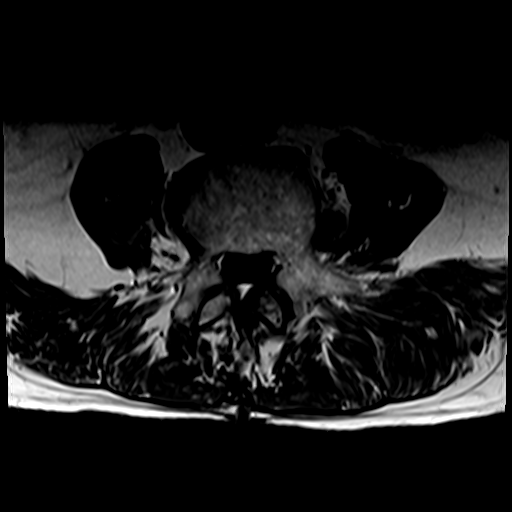
[im 18/38]
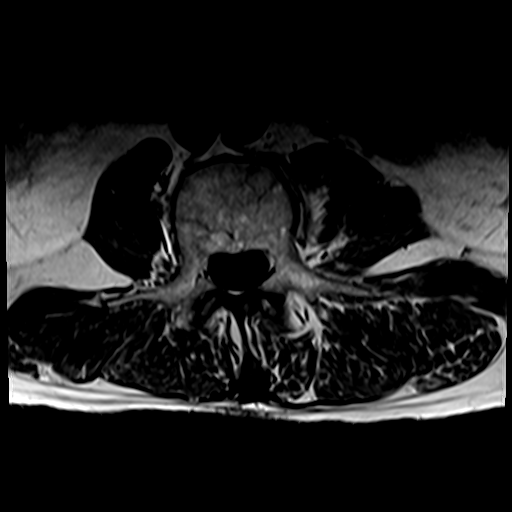
[im 20/38]
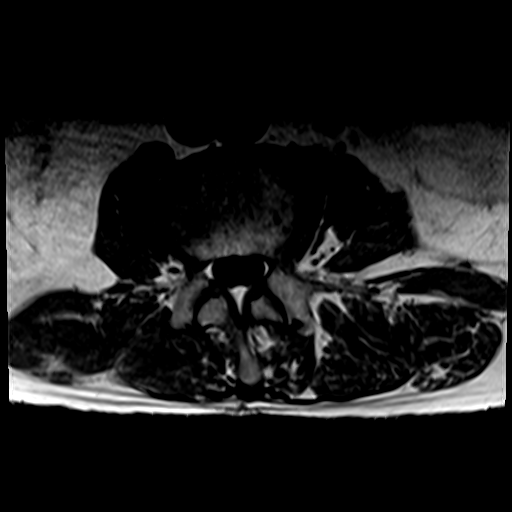
[im 26/38]
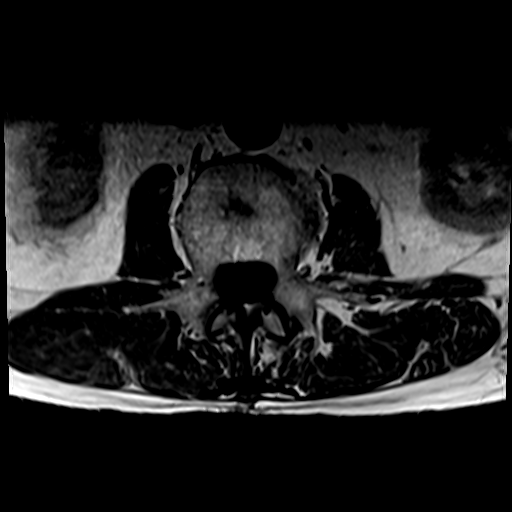
[im 32/38]
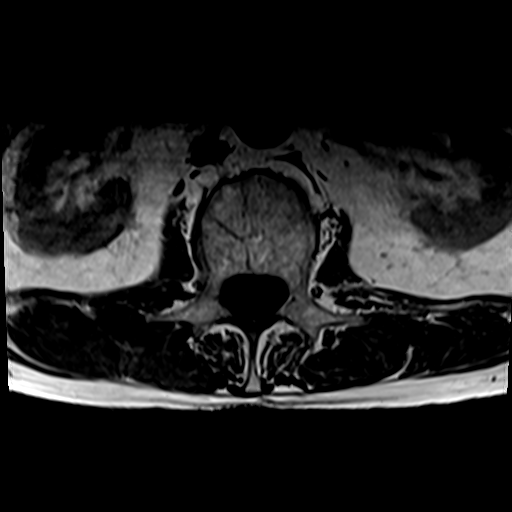
[im 38/38]
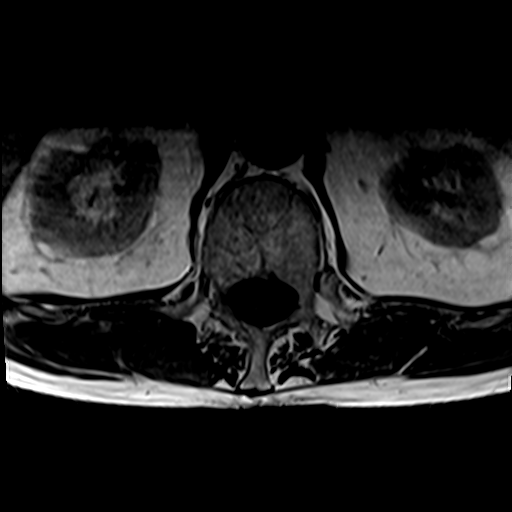

[30 of 48 positions shown; findings below may reference images not displayed]

FINDINGS: Segmentation: There are five lumbar type vertebral bodies. The last
full intervertebral disc space is labeled L5-S1. This correlates
with the prior MRI examination.

Alignment:  Normal overall alignment.

Vertebrae:  Normal marrow signal. No bone lesions or fractures.

Conus medullaris and cauda equina: Conus extends to the T12-L1
level. Conus and cauda equina appear normal.

Paraspinal and other soft tissues: No significant paraspinal or
retroperitoneal findings.

Disc levels:

T12-L1: Disc desiccation and mild diffuse annular bulge with slight
flattening of the ventral thecal sac but no significant spinal,
lateral recess or foraminal stenosis.

L1-2: Mild disc desiccation and degeneration with a slight bulging
annulus and mild osteophytic ridging. Slight flattening of the
ventral thecal sac but no focal disc protrusion, spinal or foraminal
stenosis.

L2-3: Degenerative disc disease and facet disease. There is a
shallow broad-based left-sided disc protrusion with mild left
lateral recess encroachment and mild left foraminal encroachment. No
direct neural compression or displacement of the left L2 nerve root.

L3-4: Advanced degenerative disc disease with disc space narrowing,
bulging annulus and osteophytic ridging. There is mild flattening of
the ventral thecal sac and mild bilateral lateral recess stenosis.
This appears relatively stable. There is also a far lateral disc
protrusion on the left and osteophytic spurring but no significant
spinal stenosis. Encroachment on the left L3 nerve root
extraforaminally appears stable.

L4-5: Diffuse annular bulge and osteophytic ridging but no
significant spinal stenosis. There is severe right-sided facet
disease and a right foraminal disc protrusion contributing to
moderate right foraminal stenosis. There is also a right-sided
synovial cyst measuring 8 mm and contributing to encroachment on the
right L4 nerve root. This was also present on the prior MRI.

L5-S1: Moderate bilateral facet disease but no disc protrusions,
spinal or foraminal stenosis.
IMPRESSION: 1. Stable degenerative lumbar spondylosis with multilevel disc
disease and facet disease.
2. Stable shallow broad-based left-sided disc protrusion at L2-3
with mild left lateral recess encroachment and mild left foraminal
encroachment.
3. Stable far lateral disc protrusion on the left at L3-4 with
associated osteophytic spurring contributing to mild bilateral
lateral recess stenosis and left foraminal encroachment.
4. Persistent multifactorial right foraminal stenosis at L4-5.

## 2021-04-28 NOTE — Progress Notes (Signed)
Patient: Alan Stafford  Service Category: E/M  Provider: Gaspar Cola, MD  DOB: 28-Nov-1945  DOS: 04/30/2021  Location: Office  MRN: [id number]  Setting: Ambulatory outpatient  Referring Provider: Raelene Bott, MD  Type: Established Patient  Specialty: Interventional Pain Management  PCP: Raelene Bott, MD  Location: Remote location  Delivery: TeleHealth     Virtual Encounter - Pain Management PROVIDER NOTE: Information contained herein reflects review and annotations entered in association with encounter. Interpretation of such information and data should be left to medically-trained personnel. Information provided to patient can be located elsewhere in the medical record under "Patient Instructions". Document created using STT-dictation technology, any transcriptional errors that may result from process are unintentional.    Contact & Pharmacy Preferred: [phone] Home: [phone] (home) Mobile: There is no such number on file (mobile). E-mail: [email or web address]  [id number] [address], [place] [address] Phone: [phone] Fax: [place] [phone] - [place] [address], [place] - [place] [address] Phone: [phone] Fax: [phone]   Pre-screening  Alan Stafford offered "in-person" vs "virtual" encounter. He indicated preferring virtual for this encounter.   Reason COVID-19*   Social distancing based on CDC and AMA recommendations.   I contacted Alan Stafford on 04/30/2021 via telephone.      I clearly identified myself as Gaspar Cola, MD. I verified that I was speaking with the correct person using two identifiers (Name: Alan Stafford, and date of birth: November 29, 1945).  Consent I sought verbal advanced consent from Alan Stafford for virtual visit interactions. I informed Alan Stafford of possible security and privacy concerns, risks, and  limitations associated with providing "not-in-person" medical evaluation and management services. I also informed Alan Stafford of the availability of "in-person" appointments. Finally, I informed him that there would be a charge for the virtual visit and that he could be  personally, fully or partially, financially responsible for it. Alan Stafford expressed understanding and agreed to proceed.   Historic Elements   Alan Stafford is a 76 y.o. year old, male patient evaluated today after our last contact on 12/05/2020. Alan Stafford  has a past medical history of A-fib [facility]), Abnormal heart rhythm, Arthritis, Asthma, CAP (community acquired pneumonia) (04/22/2016), Diabetes mellitus without complication ([place]), Dysrhythmia, GERD (gastroesophageal reflux disease), Hemoptysis (04/22/2016), Hypercholesteremia, Hypertension, Hyperthyroidism, Kidney stones, and Sepsis ([place]) (04/22/2016). He also  has a past surgical history that includes Knee surgery (Right, 2015); Eye surgery (Right, 2015); Joint replacement (Right); and Cataract extraction w/PHACO (Left, 09/26/2016). Alan Stafford has a current medication list which includes the following prescription(s): albuterol, aspirin ec, fifty50 glucose meter 2.0, budesonide-formoterol, duloxetine, enalapril, escitalopram, flecainide, glucose blood, glucose blood, glucose blood, hydrocodone-acetaminophen, metformin, methimazole, metoprolol succinate, nitroglycerin, omeprazole, phenazopyridine, sildenafil, sildenafil, trazodone, gemtesa, and warfarin. He  reports that he has never smoked. He uses smokeless tobacco. He reports that he does not drink alcohol and does not use drugs. Alan Stafford is allergic to statins and sulfa antibiotics.   HPI  Today, he is being contacted for worsening of previously known (established) problem.  The patient indicates having a flareup of his right lower extremity pain.  He refers that he has numbness in the bottom of his foot and all of the other toes, although  than the big toe.  Based on his description he seems to be having an L5 radiculopathy on the right  side.  I will schedule him to return as soon as possible for a right-sided L5-S1 LESI.  He is still on the Coumadin and therefore we need to stop for 5 days prior to coming in.  Pharmacotherapy Assessment   Opioid Analgesic:  No chronic opioid analgesics therapy prescribed by our practice. Hydrocodone/APAP 10/325, 1 tab PO q 6 hrs (40 mg/day of hydrocodone). MME: 40 mg/day.   Monitoring: [place] PMP: PDMP reviewed during this encounter.       Pharmacotherapy: No side-effects or adverse reactions reported. Compliance: No problems identified. Effectiveness: Clinically acceptable. Plan: Refer to "POC". UDS: No results found for: SUMMARY   Laboratory Chemistry Profile   Renal Lab Results  Component Value Date   BUN 48 (H) 05/23/2016   CREATININE 1.83 (H) 05/23/2016   GFRAA 41 (L) 05/23/2016   GFRNONAA 36 (L) 05/23/2016    Hepatic Lab Results  Component Value Date   AST 53 (H) 05/18/2016   ALT 37 05/18/2016   ALBUMIN 3.3 (L) 05/18/2016   ALKPHOS 72 05/18/2016    Electrolytes Lab Results  Component Value Date   NA 135 05/23/2016   K 5.2 (H) 05/23/2016   CL 101 05/23/2016   CALCIUM 8.6 (L) 05/23/2016   MG 2.2 05/20/2016   PHOS 5.1 (H) 05/20/2016    Bone No results found for: VD25OH, [id number], [id number], [id number], 25OHVITD1, 25OHVITD2, 25OHVITD3, TESTOFREE, TESTOSTERONE  Inflammation (CRP: Acute Phase) (ESR: Chronic Phase) Lab Results  Component Value Date   ESRSEDRATE 5 05/20/2016   LATICACIDVEN 1.4 04/22/2016         Note: Above Lab results reviewed.  Imaging  DG PAIN CLINIC C-ARM 1-60 MIN NO REPORT Fluoro was used, but no Radiologist interpretation will be provided.  Please refer to "NOTES" tab for provider progress note.  Assessment  The primary encounter diagnosis was Chronic lower extremity pain (1ry area of Pain) (Bilateral) (R>L). Diagnoses of Chronic low back  pain (2ry area of Pain) (Bilateral) (R>L) w/ sciatica (Bilateral), DDD (degenerative disc disease), lumbosacral, Lumbosacral spinal stenosis (Multilevel), Abnormal MRI, lumbar spine (03/24/2019), and Chronic anticoagulation (Coumadin) were also pertinent to this visit.  Plan of Care  Problem-specific:  No problem-specific Assessment & Plan notes found for this encounter.  Alan Stafford has a current medication list which includes the following long-term medication(s): albuterol, duloxetine, escitalopram, flecainide, methimazole, metoprolol succinate, nitroglycerin, omeprazole, and sildenafil.  Pharmacotherapy (Medications Ordered): No orders of the defined types were placed in this encounter.  Orders:  Orders Placed This Encounter  Procedures   Lumbar Epidural Injection    Standing Status:   Future    Standing Expiration Date:   07/31/2021    Scheduling Instructions:     Procedure: Interlaminar Lumbar Epidural Steroid injection (LESI)  L5-S1     Laterality: Right-sided     Sedation: Patient's choice.     Timeframe: ASAA    Order Specific Question:   Where will this procedure be performed?    Answer:   [facility]   Blood Thinner Instructions to Nursing    Always make sure patient has clearance from prescribing physician to stop blood thinners for interventional therapies. If the patient requires a Lovenox-bridge therapy, make sure arrangements are made to institute it with the assistance of the PCP.    Scheduling Instructions:     Have Alan Stafford stop the Coumadin (Warfarin) X 5 days prior to procedure or surgery.   Follow-up plan:   Return for [facility] procedure: (R) L5-S1 LESI , (  Blood Thinner Protocol).     Interventional Therapies  Risk   Complexity Considerations:   Estimated body mass index is 27.09 kg/m as calculated from the following:   Height as of 11/14/20: 6' 2"  (1.88 m).   Weight as of 11/14/20: 211 lb (95.7 kg). COUMADIN ANTICOAGULATION (Stop: 5 days)     Planned   Pending:      Under consideration:   Palliative left L4-5 LESI   Palliative right L5-S1 LESI   Diagnostic bilateral lumbar facet blocks #1    Completed:   Diagnostic right IA hip joint injection x1 (12/23/2019) ([facility]) Therapeutic/palliative right L3 and L4 TFESI x2 (04/06/2019) (100/100/70/70)  Therapeutic right L4 and L5 TFESI x1 (11/14/2020) (80/80/80/80) Palliative right L4-5 LESI x5 (10/29/2018) (50/50/60/>50)  Palliative left L4-5 LESI x1 (10/17/2016) (100/100/100)  Palliative right L5-S1 LESI x2 (09/01/2018) (100/100/90/90-100)    Therapeutic   Palliative (PRN) options:   Palliative left L4-5 LESI   Palliative right L5-S1 LESI      Recent Visits No visits were found meeting these conditions. Showing recent visits within past 90 days and meeting all other requirements Today's Visits Date Type Provider Dept  04/30/21 Office Visit Milinda Pointer, MD [facility]  Showing today's visits and meeting all other requirements Future Appointments No visits were found meeting these conditions. Showing future appointments within next 90 days and meeting all other requirements  I discussed the assessment and treatment plan with the patient. The patient was provided an opportunity to ask questions and all were answered. The patient agreed with the plan and demonstrated an understanding of the instructions.  Patient advised to call back or seek an in-person evaluation if the symptoms or condition worsens.  Duration of encounter: 12 minutes.  Note by: Gaspar Cola, MD Date: 04/30/2021; Time: 3:23 PM

## 2021-04-30 ENCOUNTER — Ambulatory Visit: Payer: Medicare Other | Attending: Pain Medicine | Admitting: Pain Medicine

## 2021-04-30 ENCOUNTER — Other Ambulatory Visit: Payer: Self-pay

## 2021-04-30 DIAGNOSIS — M79604 Pain in right leg: Secondary | ICD-10-CM

## 2021-04-30 DIAGNOSIS — M5137 Other intervertebral disc degeneration, lumbosacral region: Secondary | ICD-10-CM

## 2021-04-30 DIAGNOSIS — M5441 Lumbago with sciatica, right side: Secondary | ICD-10-CM

## 2021-04-30 DIAGNOSIS — R937 Abnormal findings on diagnostic imaging of other parts of musculoskeletal system: Secondary | ICD-10-CM

## 2021-04-30 DIAGNOSIS — M4807 Spinal stenosis, lumbosacral region: Secondary | ICD-10-CM | POA: Diagnosis not present

## 2021-04-30 DIAGNOSIS — M51379 Other intervertebral disc degeneration, lumbosacral region without mention of lumbar back pain or lower extremity pain: Secondary | ICD-10-CM

## 2021-04-30 DIAGNOSIS — M5442 Lumbago with sciatica, left side: Secondary | ICD-10-CM | POA: Diagnosis not present

## 2021-04-30 DIAGNOSIS — Z7901 Long term (current) use of anticoagulants: Secondary | ICD-10-CM

## 2021-04-30 DIAGNOSIS — G8929 Other chronic pain: Secondary | ICD-10-CM

## 2021-05-24 ENCOUNTER — Ambulatory Visit (HOSPITAL_BASED_OUTPATIENT_CLINIC_OR_DEPARTMENT_OTHER): Payer: Medicare Other | Admitting: Pain Medicine

## 2021-05-24 ENCOUNTER — Ambulatory Visit
Admission: RE | Admit: 2021-05-24 | Discharge: 2021-05-24 | Disposition: A | Discharge status: Home / Self Care | Payer: Medicare Other | Source: Ambulatory Visit | Attending: Pain Medicine | Admitting: Pain Medicine

## 2021-05-24 ENCOUNTER — Encounter: Payer: Self-pay | Admitting: Pain Medicine

## 2021-05-24 VITALS — BP 133/79 | HR 67 | Temp 97.0°F | Resp 18 | Ht 74.0 in | Wt 209.0 lb

## 2021-05-24 DIAGNOSIS — M5137 Other intervertebral disc degeneration, lumbosacral region: Secondary | ICD-10-CM | POA: Diagnosis present

## 2021-05-24 DIAGNOSIS — M5442 Lumbago with sciatica, left side: Secondary | ICD-10-CM | POA: Insufficient documentation

## 2021-05-24 DIAGNOSIS — M5441 Lumbago with sciatica, right side: Secondary | ICD-10-CM | POA: Diagnosis present

## 2021-05-24 DIAGNOSIS — M5416 Radiculopathy, lumbar region: Secondary | ICD-10-CM | POA: Diagnosis present

## 2021-05-24 DIAGNOSIS — G8929 Other chronic pain: Secondary | ICD-10-CM | POA: Diagnosis not present

## 2021-05-24 DIAGNOSIS — M4807 Spinal stenosis, lumbosacral region: Secondary | ICD-10-CM

## 2021-05-24 DIAGNOSIS — M79604 Pain in right leg: Secondary | ICD-10-CM | POA: Diagnosis not present

## 2021-05-24 DIAGNOSIS — Z7901 Long term (current) use of anticoagulants: Secondary | ICD-10-CM | POA: Diagnosis present

## 2021-05-24 DIAGNOSIS — M51379 Other intervertebral disc degeneration, lumbosacral region without mention of lumbar back pain or lower extremity pain: Secondary | ICD-10-CM

## 2021-05-24 MED ORDER — LACTATED RINGERS IV SOLN: 1000.0000 mL | Freq: Once | INTRAVENOUS | Status: DC

## 2021-05-24 MED ORDER — MIDAZOLAM HCL 5 MG/5ML IJ SOLN: 0.5000 mg | Freq: Once | INTRAMUSCULAR | Status: DC

## 2021-05-24 MED ORDER — LIDOCAINE HCL 2 % IJ SOLN
20.0000 mL | Freq: Once | INTRAMUSCULAR | Status: AC
  Administered 2021-05-24: 100 mg

## 2021-05-24 MED ORDER — PENTAFLUOROPROP-TETRAFLUOROETH EX AERO
INHALATION_SPRAY | Freq: Once | CUTANEOUS | Status: AC
  Administered 2021-05-24: 30 via TOPICAL
  Filled 2021-05-24: qty 116

## 2021-05-24 MED ORDER — IOHEXOL 180 MG/ML  SOLN
10.0000 mL | Freq: Once | INTRAMUSCULAR | Status: AC
  Administered 2021-05-24: 5 mL via EPIDURAL

## 2021-05-24 MED ORDER — TRIAMCINOLONE ACETONIDE 40 MG/ML IJ SUSP
INTRAMUSCULAR | Status: AC
  Filled 2021-05-24: qty 1

## 2021-05-24 MED ORDER — LIDOCAINE HCL (PF) 2 % IJ SOLN
INTRAMUSCULAR | Status: AC
  Filled 2021-05-24: qty 5

## 2021-05-24 MED ORDER — ROPIVACAINE HCL 2 MG/ML IJ SOLN
INTRAMUSCULAR | Status: AC
  Filled 2021-05-24: qty 20

## 2021-05-24 MED ORDER — SODIUM CHLORIDE (PF) 0.9 % IJ SOLN
INTRAMUSCULAR | Status: AC
  Filled 2021-05-24: qty 10

## 2021-05-24 MED ORDER — TRIAMCINOLONE ACETONIDE 40 MG/ML IJ SUSP
40.0000 mg | Freq: Once | INTRAMUSCULAR | Status: AC
  Administered 2021-05-24: 40 mg

## 2021-05-24 MED ORDER — ROPIVACAINE HCL 2 MG/ML IJ SOLN
2.0000 mL | Freq: Once | INTRAMUSCULAR | Status: AC
  Administered 2021-05-24: 2 mL via EPIDURAL

## 2021-05-24 MED ORDER — SODIUM CHLORIDE 0.9% FLUSH
2.0000 mL | Freq: Once | INTRAVENOUS | Status: AC
  Administered 2021-05-24: 2 mL

## 2021-05-24 NOTE — Progress Notes (Signed)
PROVIDER NOTE: Interpretation of information contained herein should be left to medically-trained personnel. Specific patient instructions are provided elsewhere under "Patient Instructions" section of medical record. This document was created in part using STT-dictation technology, any transcriptional errors that may result from this process are unintentional.  ?Patient: ABENEZER ODONELL ?Type: Established ?DOB: 02/26/1945 ?MRN: [id number] ?PCP: Lindwood Qua, MD  Service: Procedure ?DOS: 05/24/2021 ?Setting: Ambulatory ?Location: Ambulatory outpatient facility ?Delivery: Face-to-face Provider: Oswaldo Done, MD ?Specialty: Interventional Pain Management ?Specialty designation: 09 ?Location: Outpatient facility ?Ref. Prov.: Lindwood Qua, MD   ? ?Primary Reason for Visit: Interventional Pain Management Treatment. ?CC: Back Pain (low) ?  ?Procedure:          ? Type: Lumbar epidural steroid injection (LESI) (interlaminar) #3    ?Laterality: Right   ?Level:  L5-S1 Level.  ?Imaging: Fluoroscopic guidance ?Anesthesia: Local anesthesia (1-2% Lidocaine) ?Anxiolysis: None                 ?Sedation: None. ?DOS: 05/24/2021  ?Performed by: Oswaldo Done, MD ? ?Purpose: Diagnostic/Therapeutic ?Indications: Lumbar radicular pain of intraspinal etiology of more than 4 weeks that has failed to respond to conservative therapy and is severe enough to impact quality of life or function. ?1. Chronic lower extremity pain (1ry area of Pain) (Bilateral) (R>L)   ?2. Chronic lumbar radicular pain (Right)   ?3. Chronic low back pain (2ry area of Pain) (Bilateral) (R>L) w/ sciatica (Bilateral)   ?4. Lumbosacral spinal stenosis (Multilevel)   ?5. DDD (degenerative disc disease), lumbosacral   ? Chronic anticoagulation (Coumadin)   ? ?NAS-11 Pain score:  ? Pre-procedure: 9 /10  ? Post-procedure: 0-No pain/10  ? ?  ? ?Position / Prep / Materials:  ?Position: Prone w/ head of the table raised (slight reverse trendelenburg) to facilitate  breathing.  ?Prep solution: DuraPrep (Iodine Povacrylex [0.7% available iodine] and Isopropyl Alcohol, 74% w/w) ?Prep Area: Entire Posterior Lumbar Region from lower scapular tip down to mid buttocks area and from flank to flank. ?Materials:  ?Tray: Epidural tray ?Needle(s):  ?Type: Epidural needle          ?Gauge (G):  17 ?Length: Regular (3.5-in) ?Qty: 1 ? ?Pre-op H&P Assessment:  ?Mr. Khamis is a 76 y.o. (year old), male patient, seen today for interventional treatment. He  has a past surgical history that includes Knee surgery (Right, 2015); Eye surgery (Right, 2015); Joint replacement (Right); and Cataract extraction w/PHACO (Left, 09/26/2016). Mr. Conely has a current medication list which includes the following prescription(s): albuterol, aspirin ec, fifty50 glucose meter 2.0, budesonide-formoterol, duloxetine, enalapril, escitalopram, flecainide, glucose blood, glucose blood, glucose blood, hydrocodone-acetaminophen, metformin, methimazole, metoprolol succinate, nitroglycerin, omeprazole, phenazopyridine, sildenafil, sildenafil, trazodone, gemtesa, and warfarin. His primarily concern today is the Back Pain (low) ? ?Initial Vital Signs:  ?Pulse/HCG Rate: 67ECG Heart Rate: 70 ?Temp: (!) 97 ?F (36.1 ?C) ?Resp: 18 ?BP: 126/80 ?SpO2: 98 % ? ?BMI: Estimated body mass index is 26.83 kg/m? as calculated from the following: ?  Height as of this encounter: 6\' 2"  (1.88 m). ?  Weight as of this encounter: 209 lb (94.8 kg). ? ?Risk Assessment: ?Allergies: Reviewed. He is allergic to statins and sulfa antibiotics.  ?Allergy Precautions: None required ?Coagulopathies: Reviewed. None identified.  ?Blood-thinner therapy: None at this time ?Active Infection(s): Reviewed. None identified. Mr. Haycraft is afebrile ? ?Site Confirmation: Mr. Maloof was asked to confirm the procedure and laterality before marking the site ?Procedure checklist: Completed ?Consent: Before the procedure and under the influence of no  sedative(s), amnesic(s),  or anxiolytics, the patient was informed of the treatment options, risks and possible complications. To fulfill our ethical and legal obligations, as recommended by the American Medical Association's Code of Ethics, I have informed the patient of my clinical impression; the nature and purpose of the treatment or procedure; the risks, benefits, and possible complications of the intervention; the alternatives, including doing nothing; the risk(s) and benefit(s) of the alternative treatment(s) or procedure(s); and the risk(s) and benefit(s) of doing nothing. ?The patient was provided information about the general risks and possible complications associated with the procedure. These may include, but are not limited to: failure to achieve desired goals, infection, bleeding, organ or nerve damage, allergic reactions, paralysis, and death. ?In addition, the patient was informed of those risks and complications associated to Spine-related procedures, such as failure to decrease pain; infection (i.e.: Meningitis, epidural or intraspinal abscess); bleeding (i.e.: epidural hematoma, subarachnoid hemorrhage, or any other type of intraspinal or peri-dural bleeding); organ or nerve damage (i.e.: Any type of peripheral nerve, nerve root, or spinal cord injury) with subsequent damage to sensory, motor, and/or autonomic systems, resulting in permanent pain, numbness, and/or weakness of one or several areas of the body; allergic reactions; (i.e.: anaphylactic reaction); and/or death. ?Furthermore, the patient was informed of those risks and complications associated with the medications. These include, but are not limited to: allergic reactions (i.e.: anaphylactic or anaphylactoid reaction(s)); adrenal axis suppression; blood sugar elevation that in diabetics may result in ketoacidosis or comma; water retention that in patients with history of congestive heart failure may result in shortness of breath, pulmonary edema, and  decompensation with resultant heart failure; weight gain; swelling or edema; medication-induced neural toxicity; particulate matter embolism and blood vessel occlusion with resultant organ, and/or nervous system infarction; and/or aseptic necrosis of one or more joints. ?Finally, the patient was informed that Medicine is not an exact science; therefore, there is also the possibility of unforeseen or unpredictable risks and/or possible complications that may result in a catastrophic outcome. The patient indicated having understood very clearly. We have given the patient no guarantees and we have made no promises. Enough time was given to the patient to ask questions, all of which were answered to the patient's satisfaction. Mr. Fullwood has indicated that he wanted to continue with the procedure. ?Attestation: I, the ordering provider, attest that I have discussed with the patient the benefits, risks, side-effects, alternatives, likelihood of achieving goals, and potential problems during recovery for the procedure that I have provided informed consent. ?Date  Time: 05/24/2021 10:57 AM ? ?Pre-Procedure Preparation:  ?Monitoring: As per clinic protocol. Respiration, ETCO2, SpO2, BP, heart rate and rhythm monitor placed and checked for adequate function ?Safety Precautions: Patient was assessed for positional comfort and pressure points before starting the procedure. ?Time-out: I initiated and conducted the "Time-out" before starting the procedure, as per protocol. The patient was asked to participate by confirming the accuracy of the "Time Out" information. Verification of the correct person, site, and procedure were performed and confirmed by me, the nursing staff, and the patient. "Time-out" conducted as per Joint Commission's Universal Protocol (UP.01.01.01). ?Time: 1112 ? ?Description/Narrative of Procedure:          ?Target: Epidural space via interlaminar opening, initially targeting the lower laminar border of the  superior vertebral body. ?Region: Lumbar ?Approach: Percutaneous paravertebral ? ?Rationale (medical necessity): procedure needed and proper for the diagnosis and/or treatment of the patient's medical symptoms

## 2021-05-24 NOTE — Progress Notes (Signed)
Safety precautions to be maintained throughout the outpatient stay will include: orient to surroundings, keep bed in low position, maintain call bell within reach at all times, provide assistance with transfer out of bed and ambulation.  

## 2021-05-24 NOTE — Patient Instructions (Addendum)
____________________________________________________________________________________________ ? ?Virtual Visits  ? ?What is a "Virtual Visit"? ?It is a healthcare communication encounter (medical visit) that takes place on real time (NOT TEXT or E-MAIL) over the telephone or computer device (desktop, laptop, tablet, smart phone, etc.). It allows for more location flexibility between the patient and the healthcare provider. ? ?Who decides when these types of visits will be used? ?The physician. ? ?Who is eligible for these types of visits? ?Only those patients that can be reliably reached over the telephone. ? ?What do you mean by reliably? ?We do not have time to call everyone multiple times, therefore those that tend to screen calls and then call back later are not suitable candidates for this system. We understand how people are reluctant to pickup on "unknown" calls, therefore, we suggest adding our telephone numbers to your list of "CONTACT(s)". This way, you should be able to readily identify our calls when you receive one. All of our numbers are available below.  ? ?Who is not eligible? ?This option is not available for medication management encounters, specially for controlled substances. Patients on pain medications that fall under the category of controlled substances have to come in for "Face-to-Face" encounters. This is required for mandatory monitoring of these substances. You may be asked to provide a sample for an unannounced urine drug screening test (UDS), and we will need to count your pain pills. Not bringing your pills to be counted may result in no refill. Obviously, neither one of these can be done over the phone. ? ?When will this type of visits be used? ?You can request a virtual visit whenever you are physically unable to attend a regular appointment. The decision will be made by the physician (or healthcare provider) on a case by case basis.  ? ?At what time will I be called? ?This is an  excellent question. The providers will try to call you whenever they have time available. Do not expect to be called at any specific time. The secretaries will assign you a time for your virtual visit appointment, but this is done simply to keep a list of those patients that need to be called, but not for the purpose of keeping a time schedule. Be advised that the call may come in anytime during the day, between the hours of 8:00 AM and 8::00 PM, depending on provider availability. We do understand that the system is not perfect. If you are unable to be available that day on a moments notice, then request an "in-person" appointment rather than a "virtual visit". ? ?Can I request my medication visits to be "Virtual"? ?Yes you may request it, but the decision is entirely up to the healthcare provider. Control substances require specific monitoring that requires Face-to-Face encounters. The number of encounters  and the extent of the monitoring is determined on a case by case basis. ? ?Add a new contact to your smart phone and label it "PAIN CLINIC" ?Under this contact add the following numbers: ?Main: [phone] (Official Contact Number) ?Nurses: [phone] (These are outgoing only calling systems. Do not call this number.) ?Dr. Naveira: [phone] or [phone] (Outgoing calls only. Do not call this number.) ? ?____________________________________________________________________________________________ ? ____________________________________________________________________________________________ ? ?Post-Procedure Discharge Instructions ? ?Instructions: ?Apply ice:  ?Purpose: This will minimize any swelling and discomfort after procedure.  ?When: Day of procedure, as soon as you get home. ?How: Fill a plastic sandwich bag with crushed ice. Cover it with a small towel and apply to injection   site. ?How long: (15 min on, 15 min off) Apply for 15 minutes then remove x 15 minutes.  Repeat sequence on day of  procedure, until you go to bed. ?Apply heat:  ?Purpose: To treat any soreness and discomfort from the procedure. ?When: Starting the next day after the procedure. ?How: Apply heat to procedure site starting the day following the procedure. ?How long: May continue to repeat daily, until discomfort goes away. ?Food intake: Start with clear liquids (like water) and advance to regular food, as tolerated.  ?Physical activities: Keep activities to a minimum for the first 8 hours after the procedure. After that, then as tolerated. ?Driving: If you have received any sedation, be responsible and do not drive. You are not allowed to drive for 24 hours after having sedation. ?Blood thinner: (Applies only to those taking blood thinners) You may restart your blood thinner 6 hours after your procedure. ?Insulin: (Applies only to Diabetic patients taking insulin) As soon as you can eat, you may resume your normal dosing schedule. ?Infection prevention: Keep procedure site clean and dry. Shower daily and clean area with soap and water. ?Post-procedure Pain Diary: Extremely important that this be done correctly and accurately. Recorded information will be used to determine the next step in treatment. For the purpose of accuracy, follow these rules: ?Evaluate only the area treated. Do not report or include pain from an untreated area. For the purpose of this evaluation, ignore all other areas of pain, except for the treated area. ?After your procedure, avoid taking a long nap and attempting to complete the pain diary after you wake up. Instead, set your alarm clock to go off every hour, on the hour, for the initial 8 hours after the procedure. Document the duration of the numbing medicine, and the relief you are getting from it. ?Do not go to sleep and attempt to complete it later. It will not be accurate. If you received sedation, it is likely that you were given a medication that may cause amnesia. Because of this, completing the  diary at a later time may cause the information to be inaccurate. This information is needed to plan your care. ?Follow-up appointment: Keep your post-procedure follow-up evaluation appointment after the procedure (usually 2 weeks for most procedures, 6 weeks for radiofrequencies). DO NOT FORGET to bring you pain diary with you.  ? ?Expect: (What should I expect to see with my procedure?) ?From numbing medicine (AKA: Local Anesthetics): Numbness or decrease in pain. You may also experience some weakness, which if present, could last for the duration of the local anesthetic. ?Onset: Full effect within 15 minutes of injected. ?Duration: It will depend on the type of local anesthetic used. On the average, 1 to 8 hours.  ?From steroids (Applies only if steroids were used): Decrease in swelling or inflammation. Once inflammation is improved, relief of the pain will follow. ?Onset of benefits: Depends on the amount of swelling present. The more swelling, the longer it will take for the benefits to be seen. In some cases, up to 10 days. ?Duration: Steroids will stay in the system x 2 weeks. Duration of benefits will depend on multiple posibilities including persistent irritating factors. ?Side-effects: If present, they may typically last 2 weeks (the duration of the steroids). ?Frequent: Cramps (if they occur, drink Gatorade and take over-the-counter Magnesium 450-500 mg once to twice a day); water retention with temporary weight gain; increases in blood sugar; decreased immune system response; increased appetite. ?Occasional: Facial flushing (red, warm   cheeks); mood swings; menstrual changes. ?Uncommon: Long-term decrease or suppression of natural hormones; bone thinning. (These are more common with higher doses or more frequent use. This is why we prefer that our patients avoid having any injection therapies in other practices.)  ?Very Rare: Severe mood changes; psychosis; aseptic necrosis. ?From procedure: Some  discomfort is to be expected once the numbing medicine wears off. This should be minimal if ice and heat are applied as instructed. ? ?Call if: (When should I call?) ?You experience numbness and weakness that get

## 2021-05-25 ENCOUNTER — Telehealth: Payer: Self-pay | Admitting: *Deleted

## 2021-05-25 NOTE — Telephone Encounter (Signed)
Post procedure call;  voicemail left to call if there questions or concerns.  ?

## 2021-05-31 ENCOUNTER — Other Ambulatory Visit: Payer: Self-pay

## 2021-05-31 ENCOUNTER — Telehealth: Payer: Self-pay

## 2021-05-31 DIAGNOSIS — Z1211 Encounter for screening for malignant neoplasm of colon: Secondary | ICD-10-CM

## 2021-05-31 MED ORDER — PEG 3350-KCL-NA BICARB-NACL 420 G PO SOLR: 4000.0000 mL | Freq: Once | ORAL | 0 refills | Status: AC

## 2021-05-31 NOTE — Telephone Encounter (Signed)
MADE SURE I HAD SENT BLOOD THINNER ? ?

## 2021-05-31 NOTE — Progress Notes (Signed)
Gastroenterology Pre-Procedure Review ? ?Request Date: 06/21/2021 ?Requesting Physician: Dr. Vicente Males ? ?PATIENT REVIEW QUESTIONS: The patient responded to the following health history questions as indicated:   ? ?1. Are you having any GI issues? no ?2. Do you have a personal history of Polyps? no ?3. Do you have a family history of Colon Cancer or Polyps? no ?4. Diabetes Mellitus? yes (type 2) ?5. Joint replacements in the past 12 months?no ?6. Major health problems in the past 3 months?no ?7. Any artificial heart valves, MVP, or defibrillator?no ?   ?MEDICATIONS & ALLERGIES:    ?Patient reports the following regarding taking any anticoagulation/antiplatelet therapy:   ?Plavix, Coumadin, Eliquis, Xarelto, Lovenox, Pradaxa, Brilinta, or Effient? yes (warfrin) ?Aspirin? yes (81 mg) ? ?Patient confirms/reports the following medications:  ?Current Outpatient Medications  ?Medication Sig Dispense Refill  ? albuterol (PROVENTIL HFA;VENTOLIN HFA) 108 (90 Base) MCG/ACT inhaler Inhale 1 puff into the lungs every 6 (six) hours as needed for wheezing or shortness of breath.     ? aspirin EC 81 MG tablet Take 81 mg by mouth.    ? Blood Glucose Monitoring Suppl (FIFTY50 GLUCOSE METER 2.0) w/Device KIT 1 each by Other route once. Use as instructed to check fasting BS daily.  ? ?Glucometer ; One-Touch    ? budesonide-formoterol (SYMBICORT) 160-4.5 MCG/ACT inhaler Inhale into the lungs.    ? DULoxetine (CYMBALTA) 30 MG capsule Take 30 mg by mouth 2 (two) times daily.    ? enalapril (VASOTEC) 10 MG tablet Take 10 mg by mouth daily.    ? escitalopram (LEXAPRO) 20 MG tablet Take 20 mg by mouth daily.     ? flecainide (TAMBOCOR) 100 MG tablet Take 100 mg by mouth 2 (two) times daily.    ? glucose blood test strip     ? glucose blood test strip USE AS DIRECTED    ? glucose blood test strip USE AS DIRECTED    ? HYDROcodone-acetaminophen (NORCO) 10-325 MG tablet Take 1 tablet by mouth every 6 (six) hours as needed.    ? metFORMIN  (GLUCOPHAGE) 500 MG tablet Take 500 mg by mouth 3 (three) times daily with meals.     ? methimazole (TAPAZOLE) 10 MG tablet Take 10 mg by mouth 2 (two) times daily.     ? metoprolol succinate (TOPROL-XL) 25 MG 24 hr tablet Take 0.5 tablets (12.5 mg total) by mouth daily. (Patient taking differently: Take 25 mg by mouth daily.) 30 tablet 0  ? nitroGLYCERIN (NITROSTAT) 0.4 MG SL tablet Place 0.4 mg under the tongue every 5 (five) minutes as needed for chest pain.     ? omeprazole (PRILOSEC) 20 MG capsule Take 20 mg by mouth daily.     ? phenazopyridine (PYRIDIUM) 200 MG tablet Take 1 tablet by mouth 3 (three) times daily as needed.     ? sildenafil (REVATIO) 20 MG tablet 2-5 daily as needed. Do not take nitroglycerin afterward    ? sildenafil (VIAGRA) 100 MG tablet Take 1 tablet (100 mg total) by mouth daily as needed for erectile dysfunction. Take two hours prior to intercourse on an empty stomach 30 tablet 1  ? traZODone (DESYREL) 50 MG tablet Take 50 mg by mouth at bedtime.     ? Vibegron (GEMTESA) 75 MG TABS Take 75 mg by mouth daily. 28 tablet 0  ? warfarin (COUMADIN) 5 MG tablet Take 5 mg by mouth daily.    ? ?No current facility-administered medications for this visit.  ? ? ?Patient confirms/reports  the following allergies:  ?Allergies  ?Allergen Reactions  ? Statins Other (See Comments)  ?  Leg weakness  ? Sulfa Antibiotics Rash  ? ? ?No orders of the defined types were placed in this encounter. ? ? ?AUTHORIZATION INFORMATION ?Primary Insurance: ?1D#: ?Group #: ? ?Secondary Insurance: ?1D#: ?Group #: ? ?SCHEDULE INFORMATION: ?Date: 06/21/2021 ?Time: ?Location:[facility] ? ? ?

## 2021-06-04 NOTE — Progress Notes (Signed)
Patient: Alan Stafford  Service Category: E/M  Provider: Gaspar Cola, MD  ?DOB: [birth year]  DOS: 06/05/2021  Location: Office  ?MRN: [id number]  Setting: Ambulatory outpatient  Referring Provider: Raelene Bott, MD  ?Type: Established Patient  Specialty: Interventional Pain Management  PCP: Raelene Bott, MD  ?Location: Remote location  Delivery: TeleHealth    ? ?Virtual Encounter - Pain Management ?PROVIDER NOTE: Information contained herein reflects review and annotations entered in association with encounter. Interpretation of such information and data should be left to medically-trained personnel. Information provided to patient can be located elsewhere in the medical record under "Patient Instructions". Document created using STT-dictation technology, any transcriptional errors that may result from process are unintentional.  ?  ?Contact & Pharmacy ?Preferred: [phone] ?Home: [phone] (home) ?Mobile: There is no such number on file (mobile). ?E-mail: [email or web address]  ?[id number] [place]?[address]?[place]?Phone: [phone]: [phone]? ?[id number] [place]?[address]?[place]?Phone: [phone]: [phone]?  ?Pre-screening  ?Mr. DRosana Hoesoffered "in-person" vs "virtual" encounter. He indicated preferring virtual for this encounter.  ? ?Reason ?COVID-19*  Social distancing based on CDC and AMA recommendations.  ? ?I contacted SRHIAN FUNARIon 06/05/2021 via telephone.      I clearly identified myself as FGaspar Cola MD. I verified that I was speaking with the correct person using two identifiers (Name: SCALLEN VANCUREN and date of birth: [birth year]. ? ?Consent ?I sought verbal advanced consent from SJohnette Abrahamfor virtual visit interactions. I informed Mr. DBartoloof possible security and privacy concerns, risks, and  limitations associated with providing "not-in-person" medical evaluation and management services. I also informed Mr. DCephasof the availability of "in-person" appointments. Finally, I informed him that there would be a charge for the virtual visit and that he could be  personally, fully or partially, financially responsible for it. Mr. DKulzerexpressed understanding and agreed to proceed.  ? ?Historic Elements   ?Mr. SJOSECARLOS HARRIOTTis a 76y.o. 76y.o. year old, male patient evaluated today after our last contact on 05/24/2021. Mr. DMichon has a past medical history of A-fib ([facility], Abnormal heart rhythm, Arthritis, Asthma, CAP (community acquired pneumonia) (04/22/2016), [place] complication ([place], Dysrhythmia, GERD (gastroesophageal reflux disease), Hemoptysis (04/22/2016), Hypercholesteremia, Hypertension, Hyperthyroidism, Kidney stones, and Sepsis ([place] (04/22/2016). He also  has a past surgical history that includes Knee surgery (Right, 2015); Eye surgery (Right, 2015); Joint replacement (Right); and Cataract extraction w/PHACO (Left, 09/26/2016). Mr. DMagowanhas a current medication list which includes the following prescription(s): albuterol, aspirin ec, fifty50 glucose meter 2.0, budesonide-formoterol, duloxetine, enalapril, escitalopram, ezetimibe, flecainide, glucose blood, glucose blood, glucose blood, hydrocodone-acetaminophen, lidocaine, metformin, methimazole, metoprolol succinate, nitroglycerin, nitroglycerin, omeprazole, phenazopyridine, prednisone, pregabalin, sildenafil, sildenafil, trazodone, gemtesa, and warfarin. He  reports that he has never smoked. He uses smokeless tobacco. He reports that he does not drink alcohol and does not use drugs. Mr. DCodneris allergic to statins and sulfa antibiotics.  ? ?HPI  ?Today, he is being contacted for a post-procedure assessment.  According to the patient he attained 100% relief of the pain for the duration of the local anesthetic which then slowly went down  to a 75% improvement.  Unfortunately, this last procedure was done on 05/24/2021 and on 06/02/2021 he fell in the shower and has been hurting since.  He  indicates not having gone to the urgent care or ED after that fall.  He refers having an increased pain in the lower back but he denies any kind of lower extremity radicular symptoms.  In fact he describes all of his pain to be in the lower back with no leg pain.  He also describes having an area of the lower back that seems to be "black and blue".  As we recall, this patient does take Coumadin.  He does refer to having diabetes type 2 and denies ever having had any hospitalizations due to diabetic coma or ketoacidosis.  He refers that he should be able to tolerate some oral steroids which I will be prescribing today to see if we can get this pain under control again.  I will also be ordering an x-ray of the lumbar spine to evaluate him for any possible fracture since he is 76 years old and does appear to have some degree of osteopenia. ? ?Post-procedure evaluation  ? Type: Lumbar epidural steroid injection (LESI) (interlaminar) #3    ?Laterality: Right   ?Level:  L5-S1 Level.  ?Imaging: Fluoroscopic guidance ?Anesthesia: Local anesthesia (1-2% Lidocaine) ?Anxiolysis: None                 ?Sedation: None. ?DOS: 05/24/2021  ?Performed by: Gaspar Cola, MD ? ?Purpose: Diagnostic/Therapeutic ?Indications: Lumbar radicular pain of intraspinal etiology of more than 4 weeks that has failed to respond to conservative therapy and is severe enough to impact quality of life or function. ?1. Chronic lower extremity pain (1ry area of Pain) (Bilateral) (R>L)   ?2. Chronic lumbar radicular pain (Right)   ?3. Chronic low back pain (2ry area of Pain) (Bilateral) (R>L) w/ sciatica (Bilateral)   ?4. Lumbosacral spinal stenosis (Multilevel)   ?5. DDD (degenerative disc disease), lumbosacral   ? Chronic anticoagulation (Coumadin)   ? ?NAS-11 Pain score:  ? Pre-procedure: 9 /10   ? Post-procedure: 0-No pain/10  ? ?  ?Effectiveness:  ?Initial hour after procedure: 100 %. ?Subsequent 4-6 hours post-procedure: 100 %. ?Analgesia past initial 6 hours: 75 % (fell in shower on 06-02-2021 and has been hurting since then.). ?Ongoing improvement:  ?Analgesic: He refers that he was enjoying an ongoing 75% improvement of his low back pain and lower extremity pain when on 06/02/2021 he slipped in the shower and fell, hitting his back.  He denies having gone to the ED to have that checked.  He does describe to be having a "black and blue" area in his lower back. ?Function: Back to baseline ?ROM: Back to baseline ? ?Pharmacotherapy Assessment  ? ?Opioid Analgesic: No chronic opioid analgesics therapy prescribed by our practice. Hydrocodone/APAP 10/325, 1 tab PO q 6 hrs (40 mg/day of hydrocodone). ?MME: 40 mg/day.  ? ?Monitoring: ?[place] PMP: PDMP reviewed during this encounter.       ?Pharmacotherapy: No side-effects or adverse reactions reported. ?Compliance: No problems identified. ?Effectiveness: Clinically acceptable. ?Plan: Refer to "POC". UDS: No results found for: SUMMARY  ? ?Laboratory Chemistry Profile  ? ?Renal ?Lab Results  ?Component Value Date  ? BUN 48 (H) 05/23/2016  ? CREATININE 1.83 (H) 05/23/2016  ? GFRAA 41 (L) 05/23/2016  ? GFRNONAA 36 (L) 05/23/2016  ?  Hepatic ?Lab Results  ?Component Value Date  ? AST 53 (H) 05/18/2016  ? ALT 37 05/18/2016  ? ALBUMIN 3.3 (L) 05/18/2016  ? ALKPHOS 72 05/18/2016  ?  ?Electrolytes ?Lab Results  ?Component Value Date  ? NA 135 05/23/2016  ?  K 5.2 (H) 05/23/2016  ? CL 101 05/23/2016  ? CALCIUM 8.6 (L) 05/23/2016  ? MG 2.2 05/20/2016  ? PHOS 5.1 (H) 05/20/2016  ?  Bone ?No results found for: [place], [id number], [id number], LE1747FT9, 25OHVITD1, 25OHVITD2, 25OHVITD3, TESTOFREE, TESTOSTERONE  ?Inflammation (CRP: Acute Phase) (ESR: Chronic Phase) ?Lab Results  ?Component Value Date  ? ESRSEDRATE 5 05/20/2016  ? LATICACIDVEN 1.4 04/22/2016  ?    ?  ? ?Note: Above  Lab results reviewed. ? ?Imaging  ?[place] C-ARM 1-60 MIN NO REPORT ?Fluoro was used, but no Radiologist interpretation will be provided.  ?Please refer to "NOTES" tab for provider progress note. ?

## 2021-06-05 ENCOUNTER — Ambulatory Visit
Payer: Medicare Other | Attending: Student in an Organized Health Care Education/Training Program | Admitting: Pain Medicine

## 2021-06-05 DIAGNOSIS — G8929 Other chronic pain: Secondary | ICD-10-CM

## 2021-06-05 DIAGNOSIS — W19XXXA Unspecified fall, initial encounter: Secondary | ICD-10-CM

## 2021-06-05 DIAGNOSIS — Y92009 Unspecified place in unspecified non-institutional (private) residence as the place of occurrence of the external cause: Secondary | ICD-10-CM

## 2021-06-05 DIAGNOSIS — M545 Low back pain, unspecified: Secondary | ICD-10-CM | POA: Diagnosis not present

## 2021-06-05 MED ORDER — PREDNISONE 20 MG PO TABS: ORAL_TABLET | ORAL | 0 refills | Status: AC

## 2021-06-08 ENCOUNTER — Ambulatory Visit
Admission: RE | Admit: 2021-06-08 | Discharge: 2021-06-08 | Disposition: A | Discharge status: Home / Self Care | Payer: Medicare Other | Attending: Pain Medicine | Admitting: Pain Medicine

## 2021-06-08 ENCOUNTER — Telehealth: Payer: Self-pay

## 2021-06-08 ENCOUNTER — Ambulatory Visit
Admission: RE | Admit: 2021-06-08 | Discharge: 2021-06-08 | Disposition: A | Discharge status: Home / Self Care | Payer: Medicare Other | Source: Ambulatory Visit | Attending: Pain Medicine | Admitting: Pain Medicine

## 2021-06-08 DIAGNOSIS — G8929 Other chronic pain: Secondary | ICD-10-CM

## 2021-06-08 DIAGNOSIS — M545 Low back pain, unspecified: Secondary | ICD-10-CM | POA: Insufficient documentation

## 2021-06-08 DIAGNOSIS — W19XXXA Unspecified fall, initial encounter: Secondary | ICD-10-CM | POA: Insufficient documentation

## 2021-06-08 DIAGNOSIS — Y92009 Unspecified place in unspecified non-institutional (private) residence as the place of occurrence of the external cause: Secondary | ICD-10-CM

## 2021-06-08 DIAGNOSIS — S22080A Wedge compression fracture of T11-T12 vertebra, initial encounter for closed fracture: Secondary | ICD-10-CM | POA: Insufficient documentation

## 2021-06-08 DIAGNOSIS — M4150 Other secondary scoliosis, site unspecified: Secondary | ICD-10-CM | POA: Diagnosis not present

## 2021-06-08 NOTE — Telephone Encounter (Signed)
Per Dr. Roslynn Amble Blood Thinner Information Request received on 06/04/21-Alan Stafford has been advised to stop Warfarin 5 days prior to scheduled colonoscopy on 06/21/21 and to restart blood thinner 2 days after his procedure.  Patient verbalized understanding of these instructions. ? ?Thanks, ?Marcelino Duster, CMA ?

## 2021-06-19 ENCOUNTER — Encounter: Payer: Self-pay | Admitting: Gastroenterology

## 2021-06-21 ENCOUNTER — Encounter: Payer: Self-pay | Admitting: Emergency Medicine

## 2021-06-21 ENCOUNTER — Ambulatory Visit: Admission: RE | Admit: 2021-06-21 | Payer: Medicare Other | Source: Home / Self Care | Admitting: Gastroenterology

## 2021-06-21 ENCOUNTER — Emergency Department: Payer: Medicare Other

## 2021-06-21 ENCOUNTER — Telehealth: Payer: Self-pay | Admitting: Pain Medicine

## 2021-06-21 ENCOUNTER — Encounter: Admission: RE | Payer: Self-pay | Source: Home / Self Care

## 2021-06-21 ENCOUNTER — Other Ambulatory Visit: Payer: Self-pay

## 2021-06-21 ENCOUNTER — Inpatient Hospital Stay: Payer: Medicare Other

## 2021-06-21 ENCOUNTER — Telehealth: Payer: Self-pay

## 2021-06-21 ENCOUNTER — Telehealth: Payer: Self-pay | Admitting: Gastroenterology

## 2021-06-21 ENCOUNTER — Inpatient Hospital Stay
Admission: EM | Admit: 2021-06-21 | Discharge: 2021-06-25 | DRG: 871 | Disposition: A | Discharge status: Other Acute Inpatient Hospital | Payer: Medicare Other | Attending: Obstetrics and Gynecology | Admitting: Obstetrics and Gynecology

## 2021-06-21 DIAGNOSIS — G9341 Metabolic encephalopathy: Secondary | ICD-10-CM | POA: Diagnosis present

## 2021-06-21 DIAGNOSIS — E1122 Type 2 diabetes mellitus with diabetic chronic kidney disease: Secondary | ICD-10-CM | POA: Diagnosis present

## 2021-06-21 DIAGNOSIS — E872 Acidosis, unspecified: Secondary | ICD-10-CM | POA: Diagnosis present

## 2021-06-21 DIAGNOSIS — T464X5A Adverse effect of angiotensin-converting-enzyme inhibitors, initial encounter: Secondary | ICD-10-CM | POA: Diagnosis present

## 2021-06-21 DIAGNOSIS — N1832 Chronic kidney disease, stage 3b: Secondary | ICD-10-CM | POA: Diagnosis present

## 2021-06-21 DIAGNOSIS — K219 Gastro-esophageal reflux disease without esophagitis: Secondary | ICD-10-CM | POA: Diagnosis present

## 2021-06-21 DIAGNOSIS — J189 Pneumonia, unspecified organism: Secondary | ICD-10-CM | POA: Diagnosis present

## 2021-06-21 DIAGNOSIS — A419 Sepsis, unspecified organism: Principal | ICD-10-CM | POA: Diagnosis present

## 2021-06-21 DIAGNOSIS — I083 Combined rheumatic disorders of mitral, aortic and tricuspid valves: Secondary | ICD-10-CM | POA: Diagnosis present

## 2021-06-21 DIAGNOSIS — R042 Hemoptysis: Secondary | ICD-10-CM | POA: Diagnosis present

## 2021-06-21 DIAGNOSIS — G894 Chronic pain syndrome: Secondary | ICD-10-CM | POA: Diagnosis present

## 2021-06-21 DIAGNOSIS — E0590 Thyrotoxicosis, unspecified without thyrotoxic crisis or storm: Secondary | ICD-10-CM | POA: Diagnosis present

## 2021-06-21 DIAGNOSIS — N183 Chronic kidney disease, stage 3 unspecified: Secondary | ICD-10-CM | POA: Diagnosis not present

## 2021-06-21 DIAGNOSIS — E1159 Type 2 diabetes mellitus with other circulatory complications: Secondary | ICD-10-CM | POA: Diagnosis present

## 2021-06-21 DIAGNOSIS — Z8673 Personal history of transient ischemic attack (TIA), and cerebral infarction without residual deficits: Secondary | ICD-10-CM | POA: Diagnosis not present

## 2021-06-21 DIAGNOSIS — Z882 Allergy status to sulfonamides status: Secondary | ICD-10-CM | POA: Diagnosis not present

## 2021-06-21 DIAGNOSIS — E875 Hyperkalemia: Secondary | ICD-10-CM | POA: Diagnosis present

## 2021-06-21 DIAGNOSIS — R195 Other fecal abnormalities: Secondary | ICD-10-CM | POA: Diagnosis present

## 2021-06-21 DIAGNOSIS — I480 Paroxysmal atrial fibrillation: Secondary | ICD-10-CM | POA: Diagnosis present

## 2021-06-21 DIAGNOSIS — I214 Non-ST elevation (NSTEMI) myocardial infarction: Secondary | ICD-10-CM | POA: Diagnosis not present

## 2021-06-21 DIAGNOSIS — I25119 Atherosclerotic heart disease of native coronary artery with unspecified angina pectoris: Secondary | ICD-10-CM | POA: Diagnosis present

## 2021-06-21 DIAGNOSIS — I130 Hypertensive heart and chronic kidney disease with heart failure and stage 1 through stage 4 chronic kidney disease, or unspecified chronic kidney disease: Secondary | ICD-10-CM | POA: Diagnosis present

## 2021-06-21 DIAGNOSIS — D631 Anemia in chronic kidney disease: Secondary | ICD-10-CM | POA: Diagnosis present

## 2021-06-21 DIAGNOSIS — Z7984 Long term (current) use of oral hypoglycemic drugs: Secondary | ICD-10-CM

## 2021-06-21 DIAGNOSIS — Z20822 Contact with and (suspected) exposure to covid-19: Secondary | ICD-10-CM | POA: Diagnosis present

## 2021-06-21 DIAGNOSIS — Z8249 Family history of ischemic heart disease and other diseases of the circulatory system: Secondary | ICD-10-CM

## 2021-06-21 DIAGNOSIS — E860 Dehydration: Secondary | ICD-10-CM | POA: Diagnosis present

## 2021-06-21 DIAGNOSIS — Z888 Allergy status to other drugs, medicaments and biological substances status: Secondary | ICD-10-CM | POA: Diagnosis not present

## 2021-06-21 DIAGNOSIS — Z79899 Other long term (current) drug therapy: Secondary | ICD-10-CM

## 2021-06-21 DIAGNOSIS — I447 Left bundle-branch block, unspecified: Secondary | ICD-10-CM | POA: Diagnosis present

## 2021-06-21 DIAGNOSIS — E7800 Pure hypercholesterolemia, unspecified: Secondary | ICD-10-CM | POA: Diagnosis present

## 2021-06-21 DIAGNOSIS — Z7951 Long term (current) use of inhaled steroids: Secondary | ICD-10-CM

## 2021-06-21 DIAGNOSIS — I5022 Chronic systolic (congestive) heart failure: Secondary | ICD-10-CM | POA: Diagnosis present

## 2021-06-21 DIAGNOSIS — I10 Essential (primary) hypertension: Secondary | ICD-10-CM | POA: Diagnosis present

## 2021-06-21 DIAGNOSIS — I4891 Unspecified atrial fibrillation: Secondary | ICD-10-CM | POA: Diagnosis present

## 2021-06-21 DIAGNOSIS — Z7982 Long term (current) use of aspirin: Secondary | ICD-10-CM

## 2021-06-21 DIAGNOSIS — Z96651 Presence of right artificial knee joint: Secondary | ICD-10-CM | POA: Diagnosis present

## 2021-06-21 DIAGNOSIS — N179 Acute kidney failure, unspecified: Secondary | ICD-10-CM | POA: Diagnosis present

## 2021-06-21 DIAGNOSIS — Z7901 Long term (current) use of anticoagulants: Secondary | ICD-10-CM

## 2021-06-21 DIAGNOSIS — F17220 Nicotine dependence, chewing tobacco, uncomplicated: Secondary | ICD-10-CM | POA: Diagnosis present

## 2021-06-21 LAB — TROPONIN I (HIGH SENSITIVITY)
Troponin I (High Sensitivity): 2339 ng/L (ref <18)
Troponin I (High Sensitivity): 3794 ng/L (ref <18)
Troponin I (High Sensitivity): 5884 ng/L (ref <18)
Troponin I (High Sensitivity): 6597 ng/L (ref <18)
Troponin I (High Sensitivity): 6847 ng/L (ref <18)

## 2021-06-21 LAB — BLOOD GAS, ARTERIAL
Acid-base deficit: 0.8 mmol/L (ref 0.0–2.0)
Bicarbonate: 23.5 mmol/L (ref 20.0–28.0)
FIO2: 0.21 %
O2 Saturation: 96.2 %
Patient temperature: 37
pCO2 arterial: 37 mmHg (ref 32–48)
pH, Arterial: 7.41 (ref 7.35–7.45)
pO2, Arterial: 80 mmHg — ABNORMAL LOW (ref 83–108)

## 2021-06-21 LAB — PROTIME-INR
INR: 1.3 — ABNORMAL HIGH (ref 0.8–1.2)
INR: 1.4 — ABNORMAL HIGH (ref 0.8–1.2)
Prothrombin Time: 15.7 seconds — ABNORMAL HIGH (ref 11.4–15.2)
Prothrombin Time: 16.7 seconds — ABNORMAL HIGH (ref 11.4–15.2)

## 2021-06-21 LAB — URINALYSIS, COMPLETE (UACMP) WITH MICROSCOPIC
Bacteria, UA: NONE SEEN
Bilirubin Urine: NEGATIVE
Glucose, UA: 50 mg/dL — AB
Ketones, ur: NEGATIVE mg/dL
Leukocytes,Ua: NEGATIVE
Nitrite: NEGATIVE
Protein, ur: NEGATIVE mg/dL
Specific Gravity, Urine: 1.015 (ref 1.005–1.030)
Squamous Epithelial / HPF: NONE SEEN (ref 0–5)
pH: 6 (ref 5.0–8.0)

## 2021-06-21 LAB — COMPREHENSIVE METABOLIC PANEL
ALT: 23 U/L (ref 0–44)
AST: 37 U/L (ref 15–41)
Albumin: 3.6 g/dL (ref 3.5–5.0)
Alkaline Phosphatase: 80 U/L (ref 38–126)
Anion gap: 6 (ref 5–15)
BUN: 31 mg/dL — ABNORMAL HIGH (ref 8–23)
CO2: 24 mmol/L (ref 22–32)
Calcium: 8.5 mg/dL — ABNORMAL LOW (ref 8.9–10.3)
Chloride: 104 mmol/L (ref 98–111)
Creatinine, Ser: 1.85 mg/dL — ABNORMAL HIGH (ref 0.61–1.24)
GFR, Estimated: 38 mL/min — ABNORMAL LOW (ref >60)
Glucose, Bld: 181 mg/dL — ABNORMAL HIGH (ref 70–99)
Potassium: 5.7 mmol/L — ABNORMAL HIGH (ref 3.5–5.1)
Sodium: 134 mmol/L — ABNORMAL LOW (ref 135–145)
Total Bilirubin: 1.3 mg/dL — ABNORMAL HIGH (ref 0.3–1.2)
Total Protein: 7.3 g/dL (ref 6.5–8.1)

## 2021-06-21 LAB — CBC WITH DIFFERENTIAL/PLATELET
Abs Immature Granulocytes: 0.09 10*3/uL — ABNORMAL HIGH (ref 0.00–0.07)
Basophils Absolute: 0 10*3/uL (ref 0.0–0.1)
Basophils Relative: 0 %
Eosinophils Absolute: 0 10*3/uL (ref 0.0–0.5)
Eosinophils Relative: 0 %
HCT: 38.8 % — ABNORMAL LOW (ref 39.0–52.0)
Hemoglobin: 12.4 g/dL — ABNORMAL LOW (ref 13.0–17.0)
Immature Granulocytes: 1 %
Lymphocytes Relative: 6 %
Lymphs Abs: 1 10*3/uL (ref 0.7–4.0)
MCH: 29 pg (ref 26.0–34.0)
MCHC: 32 g/dL (ref 30.0–36.0)
MCV: 90.9 fL (ref 80.0–100.0)
Monocytes Absolute: 1 10*3/uL (ref 0.1–1.0)
Monocytes Relative: 5 %
Neutro Abs: 16 10*3/uL — ABNORMAL HIGH (ref 1.7–7.7)
Neutrophils Relative %: 88 %
Platelets: 172 10*3/uL (ref 150–400)
RBC: 4.27 MIL/uL (ref 4.22–5.81)
RDW: 14.6 % (ref 11.5–15.5)
WBC: 18.2 10*3/uL — ABNORMAL HIGH (ref 4.0–10.5)
nRBC: 0 % (ref 0.0–0.2)

## 2021-06-21 LAB — CBG MONITORING, ED
Glucose-Capillary: 167 mg/dL — ABNORMAL HIGH (ref 70–99)
Glucose-Capillary: 158 mg/dL — ABNORMAL HIGH (ref 70–99)

## 2021-06-21 LAB — APTT: aPTT: 20 seconds — ABNORMAL LOW (ref 24–36)

## 2021-06-21 LAB — LACTIC ACID, PLASMA
Lactic Acid, Venous: 1.8 mmol/L (ref 0.5–1.9)
Lactic Acid, Venous: 2.2 mmol/L (ref 0.5–1.9)
Lactic Acid, Venous: 2.1 mmol/L (ref 0.5–1.9)
Lactic Acid, Venous: 1.6 mmol/L (ref 0.5–1.9)

## 2021-06-21 LAB — RESP PANEL BY RT-PCR (FLU A&B, COVID) ARPGX2
Influenza A by PCR: NEGATIVE
Influenza B by PCR: NEGATIVE
SARS Coronavirus 2 by RT PCR: NEGATIVE

## 2021-06-21 LAB — PROCALCITONIN: Procalcitonin: 0.86 ng/mL

## 2021-06-21 LAB — MAGNESIUM: Magnesium: 2 mg/dL (ref 1.7–2.4)

## 2021-06-21 LAB — BRAIN NATRIURETIC PEPTIDE: B Natriuretic Peptide: 223 pg/mL — ABNORMAL HIGH (ref 0.0–100.0)

## 2021-06-21 SURGERY — COLONOSCOPY WITH PROPOFOL
Anesthesia: General

## 2021-06-21 MED ORDER — ONDANSETRON HCL 4 MG PO TABS: 4.0000 mg | ORAL_TABLET | Freq: Four times a day (QID) | ORAL | Status: DC | PRN

## 2021-06-21 MED ORDER — DILTIAZEM HCL 25 MG/5ML IV SOLN
10.0000 mg | Freq: Once | INTRAVENOUS | Status: AC
  Administered 2021-06-21: 10 mg via INTRAVENOUS
  Filled 2021-06-21: qty 5

## 2021-06-21 MED ORDER — LACTATED RINGERS IV BOLUS
1000.0000 mL | Freq: Once | INTRAVENOUS | Status: AC
  Administered 2021-06-21: 1000 mL via INTRAVENOUS

## 2021-06-21 MED ORDER — MOMETASONE FURO-FORMOTEROL FUM 200-5 MCG/ACT IN AERO
2.0000 | INHALATION_SPRAY | Freq: Two times a day (BID) | RESPIRATORY_TRACT | Status: DC
  Administered 2021-06-22 – 2021-06-24 (×6): 2 via RESPIRATORY_TRACT
  Filled 2021-06-21: qty 8.8

## 2021-06-21 MED ORDER — ONDANSETRON HCL 4 MG/2ML IJ SOLN: 4.0000 mg | Freq: Four times a day (QID) | INTRAMUSCULAR | Status: DC | PRN

## 2021-06-21 MED ORDER — HEPARIN (PORCINE) 25000 UT/250ML-% IV SOLN
1300.0000 [IU]/h | INTRAVENOUS | Status: DC
  Filled 2021-06-21: qty 250

## 2021-06-21 MED ORDER — HEPARIN BOLUS VIA INFUSION
4000.0000 [IU] | Freq: Once | INTRAVENOUS | Status: DC
  Filled 2021-06-21: qty 4000

## 2021-06-21 MED ORDER — ACETAMINOPHEN 650 MG RE SUPP: 650.0000 mg | Freq: Four times a day (QID) | RECTAL | Status: DC | PRN

## 2021-06-21 MED ORDER — HYDROCODONE-ACETAMINOPHEN 10-325 MG PO TABS
1.0000 | ORAL_TABLET | Freq: Four times a day (QID) | ORAL | Status: DC | PRN
  Administered 2021-06-21 – 2021-06-25 (×14): 1 via ORAL
  Filled 2021-06-21 (×15): qty 1

## 2021-06-21 MED ORDER — ACETAMINOPHEN 325 MG PO TABS: 650.0000 mg | ORAL_TABLET | Freq: Four times a day (QID) | ORAL | Status: DC | PRN

## 2021-06-21 MED ORDER — SODIUM CHLORIDE 0.9 % IV SOLN: 2.0000 g | INTRAVENOUS | Status: DC

## 2021-06-21 MED ORDER — SODIUM CHLORIDE 0.9 % IV SOLN
500.0000 mg | INTRAVENOUS | Status: DC
  Administered 2021-06-21: 500 mg via INTRAVENOUS
  Filled 2021-06-21 (×3): qty 5

## 2021-06-21 MED ORDER — LACTATED RINGERS IV SOLN: INTRAVENOUS | Status: DC

## 2021-06-21 MED ORDER — DEXTROSE 50 % IV SOLN
1.0000 | Freq: Once | INTRAVENOUS | Status: AC
  Administered 2021-06-21: 50 mL via INTRAVENOUS
  Filled 2021-06-21: qty 50

## 2021-06-21 MED ORDER — HEPARIN (PORCINE) 25000 UT/250ML-% IV SOLN
1300.0000 [IU]/h | INTRAVENOUS | Status: DC
  Administered 2021-06-21 – 2021-06-23 (×3): 1300 [IU]/h via INTRAVENOUS
  Filled 2021-06-21 (×2): qty 250

## 2021-06-21 MED ORDER — HEPARIN BOLUS VIA INFUSION
4000.0000 [IU] | Freq: Once | INTRAVENOUS | Status: AC
  Administered 2021-06-21: 4000 [IU] via INTRAVENOUS
  Filled 2021-06-21: qty 4000

## 2021-06-21 MED ORDER — CHLORHEXIDINE GLUCONATE CLOTH 2 % EX PADS
6.0000 | MEDICATED_PAD | Freq: Every day | CUTANEOUS | Status: DC
  Administered 2021-06-21 – 2021-06-25 (×5): 6 via TOPICAL

## 2021-06-21 MED ORDER — IOHEXOL 350 MG/ML SOLN
75.0000 mL | Freq: Once | INTRAVENOUS | Status: AC | PRN
  Administered 2021-06-21: 60 mL via INTRAVENOUS

## 2021-06-21 MED ORDER — SODIUM CHLORIDE 0.9 % IV SOLN: 500.0000 mg | INTRAVENOUS | Status: DC

## 2021-06-21 MED ORDER — INSULIN ASPART 100 UNIT/ML IV SOLN
8.0000 [IU] | Freq: Once | INTRAVENOUS | Status: AC
  Administered 2021-06-21: 8 [IU] via INTRAVENOUS
  Filled 2021-06-21: qty 0.08

## 2021-06-21 MED ORDER — SODIUM CHLORIDE 0.9 % IV SOLN
2.0000 g | INTRAVENOUS | Status: DC
  Administered 2021-06-21: 2 g via INTRAVENOUS
  Filled 2021-06-21 (×2): qty 20

## 2021-06-21 NOTE — ED Notes (Signed)
Pt returned from CT °

## 2021-06-21 NOTE — ED Triage Notes (Signed)
Pt to ED via [facility] from home for generalized weakness. Per EMS pt family called them out for possible diabetic issues. Pt woke up this morning and was sweaty and incontinent of feces, however, pt has been prepping for a colonoscopy. Per EMS pts CBG 191, pt was diaphoretic upon their arrival. EMS reports that pt had temp of 100.8, pt afebrile on arrival to ED. ? ?Pt has hx/o DM and A. Fib, pt has been off his blood thinners since 06/16/21 for his colonoscopy. Pt is A & O x 3. Pt denies cough or dysuria.  ?

## 2021-06-21 NOTE — Telephone Encounter (Signed)
Pt left message to reschedule colonosocpy ?

## 2021-06-21 NOTE — Assessment & Plan Note (Signed)
Stable and not acutely exacerbated ?Last known LVEF is 45 to 50% from a 2D echocardiogram done in 02/2017 ?Hold metoprolol and enalapril for now ?Monitor respiratory status closely especially while on IV fluid hydration ?

## 2021-06-21 NOTE — Assessment & Plan Note (Addendum)
As evidenced by fever with a Tmax of 100.8, tachypnea, marked leukocytosis of 18K, lactic acidosis and imaging suggestive of multifocal pneumonia. ?Patient received 2 L fluid bolus in the ED and is currently on 125 cc an hour ?Patient has a qSOFA score of 2 ?Continue empiric antibiotic therapy with Rocephin and Zithromax ?Follow-up results of blood cultures ? ?

## 2021-06-21 NOTE — Assessment & Plan Note (Addendum)
Patient noted to have significantly elevated troponin levels which could be secondary to demand ischemia from sepsis to rule out an acute neurological event. ?We will hold off on administering heparin for now until an acute stroke is ruled out. ?Cycle troponin levels  ?We will obtain 2D echocardiogram to assess LVEF and rule out regional wall motion abnormality ?We will consult cardiology ?

## 2021-06-21 NOTE — ED Notes (Signed)
Pt had episode of incontinence of urine and stool. Pt was cleaned and male purewick placed on patient. Pts liens changed and pt was given warm blanket.  ?

## 2021-06-21 NOTE — Sepsis Progress Note (Signed)
Notified provider of need to order repeat lactic acid (3rd). .  

## 2021-06-21 NOTE — ED Notes (Signed)
Staffed called into the pt room by family at the bedside, states pt coughed up some red ting, has specimen wrapped in a tissue, EDP notified. Pt is in NAD at present ?

## 2021-06-21 NOTE — Assessment & Plan Note (Signed)
Blood pressure stable ?Hold oral antihypertensive medications for now until an acute stroke is ruled out ?

## 2021-06-21 NOTE — Assessment & Plan Note (Signed)
Most likely related to ACE inhibitor use ?We will give a dose of dextrose and insulin ?Hold enalapril for now ?

## 2021-06-21 NOTE — Telephone Encounter (Signed)
Returned the call to Alan Stafford wife.  Spoke with his daughter Alan Stafford.  Alan Stafford is being admitted to the hospital d/t pneumonia and r/o MI.  We will cancel the appt and they will call when they need [place].   ?

## 2021-06-21 NOTE — Telephone Encounter (Signed)
Had already been completed ?

## 2021-06-21 NOTE — ED Notes (Signed)
Pt taken to CT.

## 2021-06-21 NOTE — Consult Note (Signed)
?[facility] C[facility] CONSULT NOTE  ? ?    ?Patient ID: ?Alan Stafford ?MRN: [id number] ?DOB/AGE: Jun 19, 1945 76 y.o. ? ?Admit date: 06/21/2021 ?Referring Physician Dr. Joylene Igo  ?Primary Physician [facility] primary care ?Primary Cardiologist Dr. Juliann Pares ?Reason for Consultation elevated troponin ? ?HPI: Alan Stafford is a 76 year old male with a past medical history of HFpEF (LVEF 45-50% G2 DD, mild MR 2019), paroxysmal atrial fibrillation on warfarin, metoprolol and flecainide), bilateral carotid artery stenosis, hypertension, hyperlipidemia, type 2 diabetes, CKD 3, COPD, chronic pain who presented to [facility] 06/21/2021 with acute encephalopathy.  The patient was planned to have a elective colonoscopy today and was taking bowel prep over the past 24 hours but overnight became confused and diaphoretic.  He was febrile to 100.8 per EMS.  Cardiology is consulted because of his elevated troponin. ? ?The patient presents with his wife and 3 daughters who provide the history, as patient is sleeping, unable to stay awake long enough to participate.  The patient's wife states he was in his usual state of health earlier in the day yesterday without complaints other than feeling hungry and started taking a bowel prep around 5 PM.  Around 10:30 PM the patient was in bed and his wife noted he was very weak, with shaking chills but went back to bed.  Around 0300 this morning he was difficult to arouse when it was time to take more of the bowel prep and noted he felt hot to the touch.  His wife notes one time last week while he was sitting in his recliner he had some transient chest discomfort he attributed to acid reflux that went away on its own and did not recur.  She denies the patient complaining of shortness of breath, chest pain, palpitations, dizziness earlier that day.  He is not very active at baseline, his daughters note mostly sitting and watching TV with occasionally walking the dog. ? ?Denies previous history of heart  attack, or cardiac catheterization.  He had a sestamibi Myoview in 2018 that revealed normal myocardial wall thickening, normal wall motion, without perfusion deficits. ? ?Labs on admission are notable for sodium of 134, potassium 5.7, BUN/creatinine 31/1.85, GFR 38.  BNP 223, high-sensitivity troponin trending [phone].  Lactate uptrending 1.8-2.2.  Procalcitonin 0.86.  Leukocytosis to 18, H&H 12/38, platelets 172.  PT/INR 15.7/1.3.  Respiratory panel negative for influenza or COVID.  Chest x-ray with enlargement of cardiopericardial silhouette with bibasilar airspace opacities and curly B-lines possibly 2/2 pulmonary edema but no aspiration or multilobar pneumonia cannot be ruled out, possible small left pleural effusion. ?CT angiogram chest is negative for large central pulmonary emboli, stable three-vessel coronary artery calcifications, chronic underlying ILD with superimposed lower lobe predominant extensive air space process concerning for multifocal pneumonia or aspiration. ? ?Review of systems unable to assess due to patient somnolence, encephalopathy. ? ?Past Medical History:  ?Diagnosis Date  ? A-fib (HCC)   ? Abnormal heart rhythm   ? Arthritis   ? Asthma   ? CAP (community acquired pneumonia) 04/22/2016  ? Diabetes mellitus without complication (HCC)   ? Dysrhythmia   ? afib treated with flecainide  ? GERD (gastroesophageal reflux disease)   ? Hemoptysis 04/22/2016  ? Hypercholesteremia   ? Hypertension   ? Hyperthyroidism   ? Kidney stones   ? Sepsis (HCC) 04/22/2016  ?  ?Past Surgical History:  ?Procedure Laterality Date  ? CATARACT EXTRACTION W/PHACO Left 09/26/2016  ? Procedure: CATARACT EXTRACTION PHACO AND INTRAOCULAR LENS PLACEMENT (IOC);  Surgeon: Alan Crane, MD;  Location: [facility] ORS;  Service: Ophthalmology;  Laterality: Left;  [place] 00:52.0 ?AP% 17.0 ?CDE 8.86 ?Fluid pack lot # [id number]  ? EYE SURGERY Right 2015  ? cataract  ? JOINT REPLACEMENT Right   ? knee  ? KNEE SURGERY Right 2015  ?  total knee replacement revision  ?  ?(Not in a hospital admission) ? ?Social History  ? ?Socioeconomic History  ? Marital status: Married  ?  Spouse name: Not on file  ? Number of children: Not on file  ? Years of education: Not on file  ? Highest education level: Not on file  ?Occupational History  ? Not on file  ?Tobacco Use  ? Smoking status: Never  ? Smokeless tobacco: Current  ? Tobacco comments:  ?  uses dip a little  ?Vaping Use  ? Vaping Use: Never used  ?Substance and Sexual Activity  ? Alcohol use: No  ?  Alcohol/week: 0.0 standard drinks  ? Drug use: No  ? Sexual activity: Not on file  ?Other Topics Concern  ? Not on file  ?Social History Narrative  ? Not on file  ? ?Social Determinants of Health  ? ?Financial Resource Strain: Not on file  ?Food Insecurity: Not on file  ?Transportation Needs: Not on file  ?Physical Activity: Not on file  ?Stress: Not on file  ?Social Connections: Not on file  ?Intimate Partner Violence: Not on file  ?  ?Family History  ?Problem Relation Age of Onset  ? Dementia Mother   ? Heart disease Father   ?  ?PHYSICAL EXAM ?General: Elderly and ill-appearing Caucasian male, well nourished, in no acute distress.  Lying nearly flat in ED stretcher mouth open, snoring.  Briefly opens eyes to voice and light touch. ?HEENT:  Normocephalic and atraumatic. ?Neck:  No JVD.  ?Lungs: Normal respiratory effort on room air. Clear bilaterally to auscultation. No wheezes, crackles, rhonchi.  ?Heart: HRRR . Normal S1 and S2 without gallops or murmurs. Radial & DP pulses 2+ bilaterally. ?Abdomen: Non-distended appearing.  ?Msk: Normal strength and tone for age. ?Extremities: Warm and well perfused. No clubbing, cyanosis.  No peripheral edema.  ?Neuro: Somnolent, asleep and unable to participate in interview. ?Psych: Unable to assess ? ?Labs: ?  ?Lab Results  ?Component Value Date  ? WBC 18.2 (H) 06/21/2021  ? HGB 12.4 (L) 06/21/2021  ? HCT 38.8 (L) 06/21/2021  ? MCV 90.9 06/21/2021  ? PLT 172  06/21/2021  ?  ?Recent Labs  ?Lab 06/21/21 ?0905  ?NA 134*  ?K 5.7*  ?CL 104  ?CO2 24  ?BUN 31*  ?CREATININE 1.85*  ?CALCIUM 8.5*  ?PROT 7.3  ?BILITOT 1.3*  ?ALKPHOS 80  ?ALT 23  ?AST 37  ?GLUCOSE 181*  ? ?Lab Results  ?Component Value Date  ? TROPONINI <0.03 05/18/2016  ? No results found for: CHOL ?No results found for: HDL ?No results found for: LDLCALC ?No results found for: TRIG ?No results found for: CHOLHDL ?No results found for: LDLDIRECT  ?  ?Radiology: DG Chest 2 View ? ?Result Date: 06/21/2021 ?CLINICAL DATA:  Weakness EXAM: CHEST - 2 VIEW COMPARISON:  12/18/2016 FINDINGS: Moderate enlargement of the cardiopericardial silhouette with indistinct airspace opacities primarily at the lung bases and peripherally in the left mid lung, with associated obscuration of the hemidiaphragms. Faint Kerley B lines seen in the right upper lobe, bilateral Charyl Dancer a lines are present. Potential blunting of the left lateral costophrenic angle. Chronic wedging of upper thoracic  vertebra, no change from 05/18/2016. Low lung volumes are present, causing crowding of the pulmonary vasculature. Old right posterolateral rib deformities. IMPRESSION: 1. Moderate enlargement of the cardiopericardial silhouette with bibasilar airspace opacities and Kerley B lines. The airspace opacities could be a manifestation of pulmonary edema, although aspiration or multilobar pneumonia is not excluded. 2. Indistinct left costophrenic angle laterally, cannot exclude small left pleural effusion. Electronically Signed   By: Gaylyn RongWalter  Liebkemann M.D.   On: 06/21/2021 09:41  ? ?DG Lumbar Spine Complete W/Bend ? ?Result Date: 06/09/2021 ?CLINICAL DATA:  Low back pain EXAM: LUMBAR SPINE - COMPLETE WITH BENDING VIEWS COMPARISON:  12/08/2018 FINDINGS: Dextro scoliosis with left lateral osteophytes at multiple levels. Mild reversal of lumbar lordosis. Mild compression deformity at T12 probably chronic. Chronic mild superior endplate deformity at L2. Moderate  disc space narrowing and degenerative change L2-L3, L3-L4 and L4-L5. Facet degenerative changes of the lower lumbar spine. No suspicious change in alignment with flexion or extension. IMPRESSION: 1. Scoliosis and mod

## 2021-06-21 NOTE — ED Notes (Signed)
RN in room to hang antibiotics, pt is off the floor in CT currently.  ?

## 2021-06-21 NOTE — ED Provider Notes (Signed)
? ?[facility] ?Provider Note ? ? ? Event Date/Time  ? First MD Initiated Contact with Patient 06/21/21 [phone]   ?  (approximate) ? ? ?History  ? ?Chief Complaint ?Weakness ? ? ?HPI ? ?Alan Stafford is a 76 y.o. male with past medical history of hypertension, hyperlipidemia, diabetes, CAD, atrial fibrillation on Coumadin, stroke, CKD, and chronic pain syndrome who presents to the ED for altered mental status.  History is limited due to patient's confusion and disorientation, majority of history obtained from EMS.  EMS states that family called 911 this morning due to patient having fecal incontinence with confusion and appearing extremely sweaty.  Patient apparently has been prepping for a colonoscopy for the past 24 hours, but overnight became more confused than usual.  Family had denied any history of dementia or issues with his altered mental status in the past.  EMS found temperature of 100.8 on arrival, family had not been aware of any fevers previously.  On arrival to the ED, patient denies any complaints but is not sure why he is here. ?  ? ? ?Physical Exam  ? ?Triage Vital Signs: ?ED Triage Vitals [06/21/21 0858]  ?Enc Vitals Group  ?   BP 131/73  ?   Pulse Rate 80  ?   Resp 16  ?   Temp 98.2 ?F (36.8 ?C)  ?   Temp Source Oral  ?   SpO2 96 %  ?   Weight   ?   Height   ?   Head Circumference   ?   Peak Flow   ?   Pain Score   ?   Pain Loc   ?   Pain Edu?   ?   Excl. in GC?   ? ? ?Most recent vital signs: ?Vitals:  ? 06/21/21 0930 06/21/21 1000  ?BP: (!) 119/99 (!) 152/79  ?Pulse: 81 84  ?Resp: (!) 22 (!) 22  ?Temp:    ?SpO2: 99% 98%  ? ? ?Constitutional: Alert and oriented to person and place, but not time or situation. ?Eyes: Conjunctivae are normal.  Pupils equal, round, and reactive to light bilaterally. ?Head: Atraumatic. ?Nose: No congestion/rhinnorhea. ?Mouth/Throat: Mucous membranes are moist.  ?Cardiovascular: Normal rate, regular rhythm. Grossly normal heart sounds.  2+ radial  pulses bilaterally. ?Respiratory: Normal respiratory effort.  No retractions. Lungs CTAB. ?Gastrointestinal: Soft and nontender. No distention. ?Musculoskeletal: No lower extremity tenderness nor edema.  ?Neurologic:  Normal speech and language. No gross focal neurologic deficits are appreciated. ? ? ? ?ED Results / Procedures / Treatments  ? ?Labs ?(all labs ordered are listed, but only abnormal results are displayed) ?Labs Reviewed  ?CBC WITH DIFFERENTIAL/PLATELET - Abnormal; Notable for the following components:  ?    Result Value  ? WBC 18.2 (*)   ? Hemoglobin 12.4 (*)   ? HCT 38.8 (*)   ? Neutro Abs 16.0 (*)   ? Abs Immature Granulocytes 0.09 (*)   ? All other components within normal limits  ?COMPREHENSIVE METABOLIC PANEL - Abnormal; Notable for the following components:  ? Sodium 134 (*)   ? Potassium 5.7 (*)   ? Glucose, Bld 181 (*)   ? BUN 31 (*)   ? Creatinine, Ser 1.85 (*)   ? Calcium 8.5 (*)   ? Total Bilirubin 1.3 (*)   ? GFR, Estimated 38 (*)   ? All other components within normal limits  ?PROTIME-INR - Abnormal; Notable for the following components:  ? Prothrombin Time  15.7 (*)   ? INR 1.3 (*)   ? All other components within normal limits  ?BRAIN NATRIURETIC PEPTIDE - Abnormal; Notable for the following components:  ? B Natriuretic Peptide 223.0 (*)   ? All other components within normal limits  ?CBG MONITORING, ED - Abnormal; Notable for the following components:  ? Glucose-Capillary 167 (*)   ? All other components within normal limits  ?TROPONIN I (HIGH SENSITIVITY) - Abnormal; Notable for the following components:  ? Troponin I (High Sensitivity) 2,339 (*)   ? All other components within normal limits  ?CULTURE, BLOOD (ROUTINE X 2)  ?CULTURE, BLOOD (ROUTINE X 2)  ?RESP PANEL BY RT-PCR (FLU A&B, COVID) [id number]  ?LACTIC ACID, PLASMA  ?PROCALCITONIN  ?MAGNESIUM  ?URINALYSIS, ROUTINE W REFLEX MICROSCOPIC  ?LACTIC ACID, PLASMA  ?TROPONIN I (HIGH SENSITIVITY)  ? ? ? ?EKG ? ?ED ECG REPORT ?Harriet Masson, the attending physician, personally viewed and interpreted this ECG. ? ? Date: 06/21/2021 ? EKG Time: 9:01 ? Rate: 79 ? Rhythm: normal sinus rhythm ? Axis: LAD ? Intervals:first-degree A-V block  and left bundle branch block ? ST&T Change: None ? ?RADIOLOGY ?Chest x-ray reviewed by me with multifocal infiltrates representing pneumonia versus infection, no effusion noted. ? ?PROCEDURES: ? ?Critical Care performed: Yes, see critical care procedure note(s) ? ?.Critical Care ?Performed by: Chesley Noon, MD ?Authorized by: Chesley Noon, MD  ? ?Critical care provider statement:  ?  Critical care time (minutes):  45 ?  Critical care time was exclusive of:  Separately billable procedures and treating other patients and teaching time ?  Critical care was necessary to treat or prevent imminent or life-threatening deterioration of the following conditions:  Sepsis and cardiac failure ?  Critical care was time spent personally by me on the following activities:  Development of treatment plan with patient or surrogate, discussions with consultants, evaluation of patient's response to treatment, examination of patient, ordering and review of laboratory studies, ordering and review of radiographic studies, ordering and performing treatments and interventions, pulse oximetry, re-evaluation of patient's condition and review of old charts ?  I assumed direction of critical care for this patient from another provider in my specialty: no   ?  Care discussed with: admitting provider   ? ? ?MEDICATIONS ORDERED IN ED: ?Medications  ?cefTRIAXone (ROCEPHIN) 2 g in sodium chloride 0.9 % 100 mL IVPB (2 g Intravenous New Bag/Given 06/21/21 1044)  ?azithromycin (ZITHROMAX) 500 mg in sodium chloride 0.9 % 250 mL IVPB (500 mg Intravenous New Bag/Given 06/21/21 1052)  ?lactated ringers bolus 1,000 mL (0 mLs Intravenous Stopped 06/21/21 1035)  ?iohexol (OMNIPAQUE) 350 MG/ML injection 75 mL (60 mLs Intravenous Contrast Given 06/21/21 1025)   ? ? ? ?IMPRESSION / MDM / ASSESSMENT AND PLAN / ED COURSE  ?I reviewed the triage vital signs and the nursing notes. ?             ?               ? ?76 y.o. male with past medical history of hypertension, hyperlipidemia, diabetes, CAD, atrial fibrillation on Coumadin, CKD, stroke, and chronic pain syndrome who presents to the ED with confusion and diaphoresis this morning, noted to be febrile to 100.8 with EMS. ? ?Differential diagnosis includes, but is not limited to, sepsis, UTI, pneumonia, electrolyte abnormality, dehydration, intracranial process, medication effect. ? ?Patient nontoxic-appearing and in no acute distress, vital signs are reassuring but he does appear disoriented compared to his baseline.  He has a nonfocal neurologic exam and suspicion is low for stroke, would favor sepsis or other metabolic process.  Given he is afebrile here in the ED, we will do off on antibiotics but further assess for infection with UA, chest x-ray, blood cultures, lactate, and procalcitonin.  Plan to hydrate with IV fluids, check CT head given his change in mental status. ? ?CT head is negative for acute process, chest x-ray concerning for multifocal pneumonia versus pulmonary edema.  BNP only with mild elevation and patient with significant leukocytosis, he is also now coughing up brownish sputum with streaks of blood.  I would favor pneumonia as the etiology of his chest x-ray findings and we will start him on broad-spectrum antibiotics, I am also concerned for sepsis given leukocytosis and fever at home.  Troponin noted to be elevated but renal function is stable compared to previous.  CTA performed and negative for PE, does show extensive infiltrates consistent with pneumonia and potential aspiration.  Elevated troponin likely secondary to strain from sepsis but we will start patient on heparin for now.  Case discussed with hospitalist for admission. ? ?  ? ? ?FINAL CLINICAL IMPRESSION(S) / ED DIAGNOSES  ? ?Final  diagnoses:  ?Sepsis with acute organ dysfunction without septic shock, due to unspecified organism, unspecified type (HCC)  ?Community acquired pneumonia, unspecified laterality  ?NSTEMI (non-ST elevated myocar

## 2021-06-21 NOTE — ED Notes (Signed)
Patient incontinent of large bowel movement. Pt cleaned and linen changed.  ?

## 2021-06-21 NOTE — ED Notes (Signed)
Pt. Was assisted with getting cleaned up. Pt. Had a bowl movement, it was loose, family at bedside to assist. Cleaned brief and chuck placed on patient.  ?

## 2021-06-21 NOTE — Assessment & Plan Note (Signed)
Patient has a history of diabetes mellitus with complications of stage III chronic kidney disease ?He is currently n.p.o. for now due to mental status changes ?We will check blood sugars every 4 hours ?Renal function appears stable ?

## 2021-06-21 NOTE — Consult Note (Signed)
ANTICOAGULATION CONSULT NOTE - Initial Consult ? ?Pharmacy Consult for Heparin ?Indication: chest pain/ACS ? ?Allergies  ?Allergen Reactions  ? Statins Other (See Comments)  ?  Leg weakness  ? Sulfa Antibiotics Rash  ? ? ?Patient Measurements: ?Height: 6\' 2"  (188 cm) ?Weight: 94.8 kg (209 lb) ?IBW/kg (Calculated) : 82.2 ?Heparin Dosing Weight: 94.8 kg ? ?Vital Signs: ?Temp: 98.2 ?F (36.8 ?C) (04/27 [id number]) ?Temp Source: Oral (04/27 [id number]) ?BP: 126/61 (04/27 1700) ?Pulse Rate: 76 (04/27 1700) ? ?Labs: ?Recent Labs  ?  06/21/21 ?0905 06/21/21 ?1147 06/21/21 ?1446  ?HGB 12.4*  --   --   ?HCT 38.8*  --   --   ?PLT 172  --   --   ?APTT  --   --  20*  ?LABPROT 15.7*  --  16.7*  ?INR 1.3*  --  1.4*  ?CREATININE 1.85*  --   --   ?TROPONINIHS 2,339* 3,794*  --   ? ? ?Estimated Creatinine Clearance: 40.1 mL/min (A) (by C-G formula based on SCr of 1.85 mg/dL (H)). ? ? ?Medical History: ?Past Medical History:  ?Diagnosis Date  ? A-fib ([place])   ? Abnormal heart rhythm   ? Arthritis   ? Asthma   ? CAP (community acquired pneumonia) 04/22/2016  ? Diabetes mellitus without complication ([place])   ? Dysrhythmia   ? afib treated with flecainide  ? GERD (gastroesophageal reflux disease)   ? Hemoptysis 04/22/2016  ? Hypercholesteremia   ? Hypertension   ? Hyperthyroidism   ? Kidney stones   ? Sepsis ([place]) 04/22/2016  ? ? ?Medications:  ?No history of chronic anticoagulant use ? ?Assessment: ?Pharmacy has been consulted to initiate and monitor heparin in 75yo with past medical history of HFpEF, paroxysmal atrial fibrillation(on warfarin, metoprolol, and flecainide), bilateral carotid stenosis who presented to the ED with acute encephalopathy. The patient was to have an elective colonoscopy today(06/21/21) and was taking bowel prep over the past 24 hours but overnight became confused and diaphoretic. Patient with troponin levels 2339, 3794, and 5884. Baseline labs: aPTT 20sec, INR 1.4, Hgb 12.4, Plts 172. ? ?Goal of Therapy:  ?Heparin level 0.3-0.7  units/ml ?Monitor platelets by anticoagulation protocol: Yes ?  ?Plan:  ?Give 4000 units bolus x 1 ?Start heparin infusion at 1300 units/hr ?Check anti-Xa level in 8 hours and daily while on heparin ?Continue to monitor H&H and platelets ? ?Alan Stafford ?06/21/2021,5:21 PM ? ? ?

## 2021-06-21 NOTE — Assessment & Plan Note (Signed)
Patient has a history of paroxysmal atrial fibrillation and is on chronic anticoagulation with Coumadin. ?He has been off Coumadin for about 4 days in preparation for planned colonoscopy. ?INR is currently subtherapeutic ?We will place patient on a heparin drip once MRI of the brain results and is negative for an acute stroke ?

## 2021-06-21 NOTE — H&P (Addendum)
?History and Physical  ? ? ?Patient: Alan Stafford OJJ:[id number] DOB: 1945/09/29 ?DOA: 06/21/2021 ?DOS: the patient was seen and examined on 06/21/2021 ?PCP: Raelene Bott, MD  ?Patient coming from: Home ? ?Chief Complaint:  ?Chief Complaint  ?Patient presents with  ? Weakness  ? ?Most of the history was obtained from patient's wife and daughters at the bedside. ?HPI: Alan Stafford is a 76 y.o. 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 ?They state that patient was in his usual state of health and was scheduled for colonoscopy on the day of presentation.  He has been off his Coumadin for about 4 days for the planned procedure and had started taking his prep for the colonoscopy the day prior to his admission.  His wife states that later that night he became very lethargic, sweaty and confused.  She also states that he had shaking chills.  She checked a home COVID-19 test which was negative.  She called EMS due to his worsening symptoms. ?Per EMS he had a temp of 100.8 and his blood sugar was 191. ?During my evaluation he is very lethargic and only arouses to deep sternal rub. ?I am unable to do review of systems on this patient due to his mental status changes ? ? ? ?Review of Systems: Patient unable to participate in this history and physical due to mental status changes ?Past Medical History:  ?Diagnosis Date  ? A-fib ([place])   ? Abnormal heart rhythm   ? Arthritis   ? Asthma   ? CAP (community acquired pneumonia) 04/22/2016  ? Diabetes mellitus without complication ([place])   ? Dysrhythmia   ? afib treated with flecainide  ? GERD (gastroesophageal reflux disease)   ? Hemoptysis 04/22/2016  ? Hypercholesteremia   ? Hypertension   ? Hyperthyroidism   ? Kidney  stones   ? Sepsis ([place]) 04/22/2016  ? ?Past Surgical History:  ?Procedure Laterality Date  ? CATARACT EXTRACTION W/PHACO Left 09/26/2016  ? Procedure: CATARACT EXTRACTION PHACO AND INTRAOCULAR LENS PLACEMENT (IOC);  Surgeon: Eulogio Bear, MD;  Location: [facility] ORS;  Service: Ophthalmology;  Laterality: Left;  [place] 00:52.0 ?AP% 17.0 ?CDE 8.86 ?Fluid pack lot # [id number]  ? EYE SURGERY Right 2015  ? cataract  ? JOINT REPLACEMENT Right   ? knee  ? KNEE SURGERY Right 2015  ? total knee replacement revision  ? ?Social History:  reports that he has never smoked. He uses smokeless tobacco. He reports that he does not drink alcohol and does not use drugs. ? ?Allergies  ?Allergen Reactions  ? Statins Other (See Comments)  ?  Leg weakness  ? Sulfa Antibiotics Rash  ? ? ?Family History  ?Problem Relation Age of Onset  ? Dementia Mother   ? Heart disease Father   ? ? ?Prior to Admission medications   ?Medication Sig Start Date End Date Taking? Authorizing Provider  ?albuterol (PROVENTIL HFA;VENTOLIN HFA) 108 (90 Base) MCG/ACT inhaler Inhale 1 puff into the lungs every 6 (six) hours as needed for wheezing or shortness of breath.     [provider]  ?aspirin EC 81 MG tablet Take 81 mg by mouth daily as needed. 01/01/11   [provider]  ?Blood Glucose Monitoring Suppl (FIFTY50 GLUCOSE METER 2.0) w/Device KIT 1 each by Other route  once. Use as instructed to check fasting BS daily.  ? ?Glucometer ; One-Touch    [provider]  ?budesonide-formoterol (SYMBICORT) 160-4.5 MCG/ACT inhaler Inhale into the lungs. 12/18/16   [provider]  ?DULoxetine (CYMBALTA) 30 MG capsule Take 30 mg by mouth 2 (two) times daily. 11/12/19   [provider]  ?enalapril (VASOTEC) 10 MG tablet Take 10 mg by mouth daily. 07/19/18   [provider]  ?escitalopram (LEXAPRO) 20 MG tablet Take 20 mg by mouth daily.  04/18/15   [provider]  ?ezetimibe (ZETIA) 10 MG tablet Take 10 mg by mouth  daily. 03/12/21   [provider]  ?flecainide (TAMBOCOR) 100 MG tablet Take 100 mg by mouth 2 (two) times daily.    [provider]  ?glucose blood test strip  12/27/09   [provider]  ?glucose blood test strip USE AS DIRECTED 07/20/13   [provider]  ?glucose blood test strip USE AS DIRECTED 07/20/13   [provider]  ?HYDROcodone-acetaminophen (NORCO) 10-325 MG tablet Take 1 tablet by mouth every 6 (six) hours as needed. 10/27/19   [provider]  ?lidocaine (LIDODERM) 5 % 1 patch daily. 05/01/21   [provider]  ?metFORMIN (GLUCOPHAGE) 500 MG tablet Take 500 mg by mouth 3 (three) times daily with meals.     [provider]  ?methimazole (TAPAZOLE) 10 MG tablet Take 10 mg by mouth 2 (two) times daily.     [provider]  ?metoprolol succinate (TOPROL-XL) 25 MG 24 hr tablet Take 0.5 tablets (12.5 mg total) by mouth daily. ?Patient taking differently: Take 25 mg by mouth daily. 05/23/16   Gladstone Lighter, MD  ?nitroGLYCERIN (NITROSTAT) 0.4 MG SL tablet Place 0.4 mg under the tongue every 5 (five) minutes as needed for chest pain.     [provider]  ?nitroGLYCERIN (NITROSTAT) 0.4 MG SL tablet Place 1 tablet under the tongue every 5 (five) minutes as needed. 05/17/21   [provider]  ?omeprazole (PRILOSEC) 20 MG capsule Take 20 mg by mouth daily.     [provider]  ?phenazopyridine (PYRIDIUM) 200 MG tablet Take 1 tablet by mouth 3 (three) times daily as needed.  02/06/17   [provider]  ?pregabalin (LYRICA) 25 MG capsule Take by mouth. 04/25/21   [provider]  ?sildenafil (REVATIO) 20 MG tablet 2-5 daily as needed. Do not take nitroglycerin afterward 08/06/16   [provider]  ?sildenafil (VIAGRA) 100 MG tablet Take 1 tablet (100 mg total) by mouth daily as needed for erectile dysfunction. Take two hours prior to intercourse on an empty stomach 12/14/19   Zara Council A, PA-C  ?traZODone (DESYREL) 50 MG tablet Take 50 mg by mouth at bedtime.  05/28/18   [provider]  ?Vibegron (GEMTESA) 75 MG TABS Take 75 mg by mouth daily. 12/07/19   Zara Council A, PA-C  ?warfarin (COUMADIN) 5 MG tablet Take 5 mg by mouth daily. 06/08/18   [provider]  ? ? ?Physical Exam: ?Vitals:  ? 06/21/21 0930 06/21/21 1000 06/21/21 1200 06/21/21 1230  ?BP: (!) 119/99 (!) 152/79 115/76 108/68  ?Pulse: 81 84 85   ?Resp: (!) 22 (!) 22 (!) 23 (!) 21  ?Temp:      ?TempSrc:      ?SpO2: 99% 98% 98%   ?Weight:      ?Height:      ? ?Physical Exam ?Vitals and nursing note reviewed.  ?Constitutional:   ?  Comments: Lethargic.  Opens eyes to deep sternal rub  ?HENT:  ?   Head: Normocephalic.  ?   Nose: Nose normal.  ?   Mouth/Throat:  ?   Mouth: Mucous membranes are dry.  ?Eyes:  ?   Conjunctiva/sclera: Conjunctivae normal.  ?Cardiovascular:  ?   Rate and Rhythm: Normal rate and regular rhythm.  ?Pulmonary:  ?   Effort: Pulmonary effort is normal.  ?   Breath sounds: Normal breath sounds.  ?Abdominal:  ?   General: Abdomen is flat.  ?   Palpations: Abdomen is soft.  ?   Tenderness: There is abdominal tenderness.  ?   Comments: Right lower quadrant tenderness  ?Musculoskeletal:     ?   General: Normal range of motion.  ?   Cervical back: Normal range of motion and neck supple.  ?Skin: ?   General: Skin is warm and dry.  ?Neurological:  ?   Comments: Unable to assess  ?Psychiatric:  ?   Comments: Unable to assess  ? ? ?Data Reviewed: ?Relevant notes from primary care and specialist visits, past discharge summaries as available in EHR, including Care Everywhere. ?Prior diagnostic testing as pertinent to current admission diagnoses ?Updated medications and problem lists for reconciliation ?ED course, including vitals, labs, imaging, treatment and response to treatment ?Triage notes, nursing and pharmacy notes and ED provider's notes ?Notable results as noted in HPI ?Labs reviewed.  BNP  223, procalcitonin 0.86, sodium 134, potassium 5.7, chloride 104, bicarb 24, glucose 181, BUN 31, creatinine 1.85, calcium 8.5, troponin 2339, magnesium 2.0, lactic acid 1.8, INR 1.3, white count 18.2, hemoglob

## 2021-06-21 NOTE — Assessment & Plan Note (Signed)
Patient noted to have multifocal pneumonia on imaging but concern for possible aspiration pneumonia. ?Continue empiric antibiotic therapy with Rocephin and Zithromax ?We will request speech therapy for swallow function evaluation once patient's mental status improved ?

## 2021-06-21 NOTE — Code Documentation (Signed)
CODE SEPSIS - PHARMACY COMMUNICATION ? ?**Broad Spectrum Antibiotics should be administered within 1 hour of Sepsis diagnosis** ? ?Time Code Sepsis Called/Page Received: 9983 ? ?Antibiotics Ordered: rocephin, azithromycin ? ?Time of 1st antibiotic administration: 1044 ? ?Additional action taken by pharmacy: notified RN to give rocephin ? ?If necessary, Name of Provider/Nurse Contacted: Roylene Reason, RN ? ? ? ?Selinda Eon ,PharmD ?Clinical Pharmacist  ?06/21/2021  9:57 AM ? ?

## 2021-06-21 NOTE — ED Notes (Signed)
Pt incontinent of stool. Had very large watery stool. Pt cleaned and bedding changed. NAD at this time. ?

## 2021-06-21 NOTE — Assessment & Plan Note (Signed)
Patient is very lethargic and only responds to deep painful stimuli ?Mental status changes may be secondary to sepsis but will also obtain an arterial blood gas as well as an MRI of the brain to rule out an acute stroke due to his history of A-fib. ?Expect improvement in patient's mental status following resolution of acute infarction ?

## 2021-06-21 NOTE — Telephone Encounter (Signed)
Patient wife called and stated that Alan Stafford is being admitted in the hospital. Patient has appt on 06-25-21. She don't believe he will be out of hospital. Thanks ?

## 2021-06-21 NOTE — Sepsis Progress Note (Signed)
eLink monitoring code sepsis.  

## 2021-06-22 ENCOUNTER — Encounter: Payer: Self-pay | Admitting: Internal Medicine

## 2021-06-22 ENCOUNTER — Inpatient Hospital Stay
Admit: 2021-06-22 | Discharge: 2021-06-22 | Disposition: A | Discharge status: Home / Self Care | Payer: Medicare Other | Attending: Cardiology | Admitting: Cardiology

## 2021-06-22 DIAGNOSIS — Z8673 Personal history of transient ischemic attack (TIA), and cerebral infarction without residual deficits: Secondary | ICD-10-CM

## 2021-06-22 DIAGNOSIS — J189 Pneumonia, unspecified organism: Secondary | ICD-10-CM | POA: Diagnosis not present

## 2021-06-22 LAB — GLUCOSE, CAPILLARY: Glucose-Capillary: 139 mg/dL — ABNORMAL HIGH (ref 70–99)

## 2021-06-22 LAB — BASIC METABOLIC PANEL
Anion gap: 6 (ref 5–15)
BUN: 23 mg/dL (ref 8–23)
CO2: 26 mmol/L (ref 22–32)
Calcium: 8.3 mg/dL — ABNORMAL LOW (ref 8.9–10.3)
Chloride: 108 mmol/L (ref 98–111)
Creatinine, Ser: 1.67 mg/dL — ABNORMAL HIGH (ref 0.61–1.24)
GFR, Estimated: 42 mL/min — ABNORMAL LOW (ref >60)
Glucose, Bld: 127 mg/dL — ABNORMAL HIGH (ref 70–99)
Potassium: 4.5 mmol/L (ref 3.5–5.1)
Sodium: 140 mmol/L (ref 135–145)

## 2021-06-22 LAB — CBC
HCT: 33.6 % — ABNORMAL LOW (ref 39.0–52.0)
Hemoglobin: 10.8 g/dL — ABNORMAL LOW (ref 13.0–17.0)
MCH: 29.1 pg (ref 26.0–34.0)
MCHC: 32.1 g/dL (ref 30.0–36.0)
MCV: 90.6 fL (ref 80.0–100.0)
Platelets: 131 10*3/uL — ABNORMAL LOW (ref 150–400)
RBC: 3.71 MIL/uL — ABNORMAL LOW (ref 4.22–5.81)
RDW: 14.5 % (ref 11.5–15.5)
WBC: 11.8 10*3/uL — ABNORMAL HIGH (ref 4.0–10.5)
nRBC: 0 % (ref 0.0–0.2)

## 2021-06-22 LAB — HEPARIN LEVEL (UNFRACTIONATED)
Heparin Unfractionated: 0.47 IU/mL (ref 0.30–0.70)
Heparin Unfractionated: 0.46 IU/mL (ref 0.30–0.70)

## 2021-06-22 LAB — PROTIME-INR
INR: 1.6 — ABNORMAL HIGH (ref 0.8–1.2)
Prothrombin Time: 18.7 seconds — ABNORMAL HIGH (ref 11.4–15.2)

## 2021-06-22 LAB — STREP PNEUMONIAE URINARY ANTIGEN: Strep Pneumo Urinary Antigen: NEGATIVE

## 2021-06-22 LAB — CORTISOL-AM, BLOOD: Cortisol - AM: 10.4 ug/dL (ref 6.7–22.6)

## 2021-06-22 LAB — MRSA NEXT GEN BY PCR, NASAL: MRSA by PCR Next Gen: DETECTED — AB

## 2021-06-22 LAB — TROPONIN I (HIGH SENSITIVITY)
Troponin I (High Sensitivity): 6245 ng/L (ref <18)
Troponin I (High Sensitivity): 5732 ng/L (ref <18)

## 2021-06-22 LAB — PROCALCITONIN: Procalcitonin: 0.87 ng/mL

## 2021-06-22 MED ORDER — MUPIROCIN 2 % EX OINT
1.0000 "application " | TOPICAL_OINTMENT | Freq: Two times a day (BID) | CUTANEOUS | Status: DC
  Administered 2021-06-22 – 2021-06-24 (×6): 1 via NASAL
  Filled 2021-06-22: qty 22

## 2021-06-22 MED ORDER — METOPROLOL SUCCINATE ER 25 MG PO TB24
25.0000 mg | ORAL_TABLET | Freq: Every day | ORAL | Status: DC
Start: 2021-06-22 — End: 2021-06-26
  Administered 2021-06-22 – 2021-06-24 (×3): 25 mg via ORAL
  Filled 2021-06-22 (×3): qty 1

## 2021-06-22 MED ORDER — PANTOPRAZOLE SODIUM 40 MG PO TBEC
40.0000 mg | DELAYED_RELEASE_TABLET | Freq: Every day | ORAL | Status: DC
  Administered 2021-06-22 – 2021-06-23 (×2): 40 mg via ORAL
  Filled 2021-06-22 (×2): qty 1

## 2021-06-22 MED ORDER — ASPIRIN EC 81 MG PO TBEC
81.0000 mg | DELAYED_RELEASE_TABLET | Freq: Every day | ORAL | Status: DC
  Administered 2021-06-22 – 2021-06-25 (×4): 81 mg via ORAL
  Filled 2021-06-22 (×4): qty 1

## 2021-06-22 MED ORDER — METHIMAZOLE 10 MG PO TABS
10.0000 mg | ORAL_TABLET | Freq: Two times a day (BID) | ORAL | Status: DC
Start: 2021-06-22 — End: 2021-06-26
  Administered 2021-06-22 – 2021-06-24 (×6): 10 mg via ORAL
  Filled 2021-06-22 (×9): qty 1

## 2021-06-22 MED ORDER — SODIUM CHLORIDE 0.9 % IV SOLN
3.0000 g | Freq: Four times a day (QID) | INTRAVENOUS | Status: DC
  Administered 2021-06-22 – 2021-06-25 (×13): 3 g via INTRAVENOUS
  Filled 2021-06-22 (×2): qty 8
  Filled 2021-06-22: qty 3
  Filled 2021-06-22 (×3): qty 8
  Filled 2021-06-22 (×2): qty 3
  Filled 2021-06-22 (×5): qty 8
  Filled 2021-06-22: qty 3
  Filled 2021-06-22 (×4): qty 8
  Filled 2021-06-22: qty 3

## 2021-06-22 MED ORDER — SODIUM CHLORIDE 0.9% FLUSH
3.0000 mL | Freq: Two times a day (BID) | INTRAVENOUS | Status: DC
  Administered 2021-06-22 – 2021-06-24 (×3): 3 mL via INTRAVENOUS

## 2021-06-22 MED ORDER — PREGABALIN 25 MG PO CAPS
25.0000 mg | ORAL_CAPSULE | Freq: Three times a day (TID) | ORAL | Status: DC
  Administered 2021-06-22 – 2021-06-24 (×9): 25 mg via ORAL
  Filled 2021-06-22 (×10): qty 1

## 2021-06-22 MED ORDER — TRAZODONE HCL 100 MG PO TABS
100.0000 mg | ORAL_TABLET | Freq: Every day | ORAL | Status: DC
  Administered 2021-06-22 – 2021-06-24 (×3): 100 mg via ORAL
  Filled 2021-06-22 (×3): qty 1

## 2021-06-22 MED ORDER — TRAZODONE HCL 50 MG PO TABS: 50.0000 mg | ORAL_TABLET | Freq: Every day | ORAL | Status: DC

## 2021-06-22 MED ORDER — EZETIMIBE 10 MG PO TABS
10.0000 mg | ORAL_TABLET | Freq: Every day | ORAL | Status: DC
  Administered 2021-06-22 – 2021-06-24 (×3): 10 mg via ORAL
  Filled 2021-06-22 (×3): qty 1

## 2021-06-22 NOTE — Consult Note (Signed)
ANTICOAGULATION CONSULT NOTE - Initial Consult ? ?Pharmacy Consult for Heparin ?Indication: chest pain/ACS ? ?Allergies  ?Allergen Reactions  ? Statins Other (See Comments)  ?  Leg weakness  ? Sulfa Antibiotics Rash  ? ? ?Patient Measurements: ?Height: 6\' 2"  (188 cm) ?Weight: 90.7 kg (199 lb 15.3 oz) ?IBW/kg (Calculated) : 82.2 ?Heparin Dosing Weight: 94.8 kg ? ?Vital Signs: ?Temp: 97.9 ?F (36.6 ?C) (04/28 0100) ?Temp Source: Oral (04/28 0100) ?BP: 103/57 (04/28 0300) ?Pulse Rate: 67 (04/28 0300) ? ?Labs: ?Recent Labs  ?  06/21/21 ?0905 06/21/21 ?1147 06/21/21 ?1446 06/21/21 ?1652 06/21/21 ?06/23/21 06/21/21 ?2153 06/22/21 ?0324  ?HGB 12.4*  --   --   --   --   --  10.8*  ?HCT 38.8*  --   --   --   --   --  33.6*  ?PLT 172  --   --   --   --   --  131*  ?APTT  --   --  20*  --   --   --   --   ?LABPROT 15.7*  --  16.7*  --   --   --  18.7*  ?INR 1.3*  --  1.4*  --   --   --  1.6*  ?HEPARINUNFRC  --   --   --   --   --   --  0.47  ?CREATININE 1.85*  --   --   --   --   --  1.67*  ?TROPONINIHS 2,339*   < >  --  5,884* 6,597* 6,847*  --   ? < > = values in this interval not displayed.  ? ? ? ?Estimated Creatinine Clearance: 44.4 mL/min (A) (by C-G formula based on SCr of 1.67 mg/dL (H)). ? ? ?Medical History: ?Past Medical History:  ?Diagnosis Date  ? A-fib ([facility])   ? Abnormal heart rhythm   ? Arthritis   ? Asthma   ? CAP (community acquired pneumonia) 04/22/2016  ? Diabetes mellitus without complication ([facility])   ? Dysrhythmia   ? afib treated with flecainide  ? GERD (gastroesophageal reflux disease)   ? Hemoptysis 04/22/2016  ? Hypercholesteremia   ? Hypertension   ? Hyperthyroidism   ? Kidney stones   ? Sepsis ([facility]) 04/22/2016  ? ? ?Medications:  ?No history of chronic anticoagulant use ? ?Assessment: ?Pharmacy has been consulted to initiate and monitor heparin in 76yo with past medical history of HFpEF, paroxysmal atrial fibrillation(on warfarin, metoprolol, and flecainide), bilateral carotid stenosis who presented to the ED  with acute encephalopathy. The patient was to have an elective colonoscopy today(06/21/21) and was taking bowel prep over the past 24 hours but overnight became confused and diaphoretic. Patient with troponin levels 2339, 3794, and 5884. Baseline labs: aPTT 20sec, INR 1.4, Hgb 12.4, Plts 172. ? ?Goal of Therapy:  ?Heparin level 0.3-0.7 units/ml ?Monitor platelets by anticoagulation protocol: Yes ?  ?Plan:  ?4/28:  HL @ 0324 = 0.47, therapeutic X 1  ?Will continue pt on current rate and draw confirmation level on 4/28 @ 1100.  ? ?Zakyia Gagan D ?06/22/2021,4:23 AM ? ? ?

## 2021-06-22 NOTE — Progress Notes (Addendum)
?PROGRESS NOTE ? ? ? ?Alan Stafford  [id number] DOB: [birth year] DOA: 06/21/2021 ?PCP: Raelene Bott, MD  ?Outpatient Specialists: cardiology ? ? ? ?Brief Narrative:  ? ?Alan Stafford is a 76 y.o. 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 ?They state that patient was in his usual state of health and was scheduled for colonoscopy on the day of presentation.  He has been off his Coumadin for about 4 days for the planned procedure and had started taking his prep for the colonoscopy the day prior to his admission.  His wife states that later that night he became very lethargic, sweaty and confused.  She also states that he had shaking chills.  She checked a home COVID-19 test which was negative.  She called EMS due to his worsening symptoms. ?Per EMS he had a temp of 100.8 and his blood sugar was 191. ?During my evaluation he is very lethargic and only arouses to deep sternal rub. ?I am unable to do review of systems on this patient due to his mental status changes ? ? ?Assessment & Plan: ?  ?Active Problems: ?  Essential hypertension ?  Paroxysmal atrial fibrillation (HCC) ?  CAP (community acquired pneumonia) ?  Chronic systolic heart failure ([place]) ?  Hyperkalemia ?  CKD stage 3 due to type 2 diabetes mellitus ([place]) ?  Sepsis ([place]) ?  NSTEMI (non-ST elevated myocardial infarction) ([place]) ?  Acute metabolic encephalopathy ? ?# Acute metabolic encephalopathy ?Essentially resolved. Precise etiology unclear. NSTEMI, pneumonia, electrolyte derangement from colonic prep, dehydration from colonic prep, on chronic medical conditions including history CVA. ? - monitor ? ?# CAP, question aspiration ?# Sepsis ?Sepsis by fever, leukocytosis. No clear inciting  aspiration event. Was in process of prep at time of mental status changes. CTA no PE but lower lobe process on CT suggestive of multifocal pneumonia or aspiration. Not requiring O2 ?- bedside swallow screen by rn today; slp eval if any concerns ?- switch abx from ceftriaxone/azithromycin to unasyn, plan for transition to augmentin as outpt ?- f/u blood cultures; sputum for culture if able; check urine legionella and strep antigens ? ?# NSTEMI ?Asymptomatic. Cardiology following. Thought to be demand. Troponin has risen to 6800 this morning. Hemodynamically stable. 3 vessel disease seen on CT. Received asa ?- 48 hours IV heparin, started 17:45 on 4/27 ?- cont aspirin, statin, zetia ?- TTE pending ?- possible cath this hospitalization ? ?# Hyperkalemia ?Possibly 2/2 colonic prep. Resolved with this morning's labs ?- monitor ? ?# Combined systolic/diastolic CHF ?EF 40-45 on 2018 tte. Appears compensated ?- TTE pending ?- cont home metoprolol ? ?# CKD 3b ?Kidney function is at baseline ? ?# A fib ?Rate controlled ?- cont home metoprolol ?- cardiology to resume home flecainide when they think appropriate ?- home warfarin on hold; heparin continuing as above ? ?# History CVA ?No acute stroke on MRI ?- home meds ? ?# Hyperthyroid ?No signs agranulocytosis on cbc ?- cont home methimazole ? ?# Chronic pain ?- resume home lyrica, cont home hydrocodone ? ?# HTN ?Home enalapril on hold 2/2 soft BPs ? ? ?DVT prophylaxis: therapeutic heparin ?Code Status: full ?Family Communication: wife updated @ bedside 4/28 ? ?Level of care: Stepdown ?Status is: Inpatient ?Remains inpatient appropriate because: severity of illness ? ? ? ?Consultants:  ?  cardiology ? ?Procedures: ?none ? ?Antimicrobials:  ?Azithromycin/ceftriaxone 4/27-4/28 ?Unasyn 4/28>  ? ? ?Subjective: ?No chest pain. No cough or sob. Has appetite ? ?Objective: ?Vitals:  ? 06/22/21 0300 06/22/21 0400 06/22/21 0500 06/22/21 0600  ?BP: (!) 103/57 108/63 113/69 (!) 106/59   ?Pulse: 67 68 69 71  ?Resp: 18 14 15 19   ?Temp:      ?TempSrc:      ?SpO2: 95% 98% 96% 97%  ?Weight:      ?Height:      ? ? ?Intake/Output Summary (Last 24 hours) at 06/22/2021 0815 ?Last data filed at 06/22/2021 0600 ?Gross per 24 hour  ?Intake 4357.24 ml  ?Output 500 ml  ?Net 3857.24 ml  ? ?Filed Weights  ? 06/21/21 0902 06/21/21 2110  ?Weight: 94.8 kg 90.7 kg  ? ? ?Examination: ? ?General exam: Appears calm and comfortable  ?Respiratory system: Clear to auscultation save for rales at bases. Respiratory effort normal. ?Cardiovascular system: S1 & S2 heard, RRR. No JVD, soft systolic murmur, no rubs, gallops or clicks. No pedal edema. ?Gastrointestinal system: Abdomen is nondistended, soft and nontender. No organomegaly or masses felt. Normal bowel sounds heard. ?Central nervous system: Alert and oriented. No focal neurological deficits. ?Extremities: Symmetric 5 x 5 power. ?Skin: No rashes, lesions or ulcers ?Psychiatry: Judgement and insight appear normal. Mood & affect appropriate.  ? ? ? ?Data Reviewed: I have personally reviewed following labs and imaging studies ? ?CBC: ?Recent Labs  ?Lab 06/21/21 ?[id number] 06/22/21 ?0324  ?WBC 18.2* 11.8*  ?NEUTROABS 16.0*  --   ?HGB 12.4* 10.8*  ?HCT 38.8* 33.6*  ?MCV 90.9 90.6  ?PLT 172 131*  ? ?Basic Metabolic Panel: ?Recent Labs  ?Lab 06/21/21 ?[id number] 06/22/21 ?0324  ?NA 134* 140  ?K 5.7* 4.5  ?CL 104 108  ?CO2 24 26  ?GLUCOSE 181* 127*  ?BUN 31* 23  ?CREATININE 1.85* 1.67*  ?CALCIUM 8.5* 8.3*  ?MG 2.0  --   ? ?GFR: ?Estimated Creatinine Clearance: 44.4 mL/min (A) (by C-G formula based on SCr of 1.67 mg/dL (H)). ?Liver Function Tests: ?Recent Labs  ?Lab 06/21/21 ?[id number]  ?AST 37  ?ALT 23  ?ALKPHOS 80  ?BILITOT 1.3*  ?PROT 7.3  ?ALBUMIN 3.6  ? ?No results for input(s): LIPASE, AMYLASE in the last 168 hours. ?No results for input(s): AMMONIA in the last 168 hours. ?Coagulation Profile: ?Recent Labs  ?Lab 06/21/21 ?[id number] 06/21/21 ?1446 06/22/21 ?0324  ?INR 1.3* 1.4* 1.6*  ? ?Cardiac  Enzymes: ?No results for input(s): CKTOTAL, CKMB, CKMBINDEX, TROPONINI in the last 168 hours. ?BNP (last 3 results) ?No results for input(s): PROBNP in the last 8760 hours. ?HbA1C: ?No results for input(s): HGBA1C in the last 72 hours. ?CBG: ?Recent Labs  ?Lab 06/21/21 ?0901 06/21/21 ?1259 06/21/21 ?2111  ?GLUCAP 167* 158* 139*  ? ?Lipid Profile: ?No results for input(s): CHOL, HDL, LDLCALC, TRIG, CHOLHDL, LDLDIRECT in the last 72 hours. ?Thyroid Function Tests: ?No results for input(s): TSH, T4TOTAL, FREET4, T3FREE, THYROIDAB in the last 72 hours. ?Anemia Panel: ?No results for input(s): VITAMINB12, FOLATE, FERRITIN, TIBC, IRON, RETICCTPCT in the last 72 hours. ?Urine analysis: ?   ?Component Value Date/Time  ? COLORURINE STRAW (A) 06/21/2021 1652  ? APPEARANCEUR CLEAR (A) 06/21/2021 1652  ? APPEARANCEUR Clear 12/07/2019 1531  ? LABSPEC 1.015 06/21/2021 1652  ? PHURINE 6.0 06/21/2021 1652  ? GLUCOSEU 50 (A) 06/21/2021 1652  ? HGBUR SMALL (A) 06/21/2021 1652  ? [place] NEGATIVE 06/21/2021 1652  ? BILIRUBINUR Negative 12/07/2019 1531  ? [place] Stain  NEGATIVE 06/21/2021 1652  ? [place] NEGATIVE 06/21/2021 1652  ? NITRITE NEGATIVE 06/21/2021 1652  ? LEUKOCYTESUR NEGATIVE 06/21/2021 1652  ? ?Sepsis Labs: ?@LABRCNTIP (procalcitonin:4,lacticidven:4) ? ?) ?Recent Results (from the past 240 hour(s))  ?Culture, blood (routine x 2)     Status: None (Preliminary result)  ? Collection Time: 06/21/21  9:05 AM  ? Specimen: BLOOD  ?Result Value Ref Range Status  ? Specimen Description BLOOD LEFT FA  Final  ? Special Requests   Final  ?  BOTTLES DRAWN AEROBIC AND ANAEROBIC Blood Culture results may not be optimal due to an excessive volume of blood received in culture bottles  ? Culture   Final  ?  NO GROWTH < 24 HOURS ?Performed at [facility], [address], [place], [place] [address] ?  ? Report Status PENDING  Incomplete  ?Culture, blood (routine x 2)     Status: None (Preliminary result)  ? Collection Time:  06/21/21  9:43 AM  ? Specimen: BLOOD  ?Result Value Ref Range Status  ? Specimen Description BLOOD RIGHT ANTECUBITAL  Final  ? Special Requests   Final  ?  BOTTLES DRAWN AEROBIC AND ANAEROBIC Blood Culture results

## 2021-06-22 NOTE — Progress Notes (Signed)
?[facility] CARDIOLOGY CONSULT NOTE  ? ?    ?Patient ID: ?Alan Stafford ?MRN: [id number] ?DOB/AGE: 09/17/1945 75 y.o. ? ?Admit date: 06/21/2021 ?Referring Physician Dr. Agbata  ?Primary Physician [facility] primary care ?Primary Cardiologist Dr. Callwood ?Reason for Consultation elevated troponin ? ?HPI: Alan Stafford is a 75-year-old male with a past medical history of HFpEF (LVEF 45-50% G2 DD, mild MR 2019), paroxysmal atrial fibrillation on warfarin, metoprolol and flecainide), bilateral carotid artery stenosis, hypertension, hyperlipidemia, type 2 diabetes, CKD 3, COPD, chronic pain who presented to [facility] 06/21/2021 with acute encephalopathy.  The patient was planned to have a elective colonoscopy today and was taking bowel prep over the past 24 hours but overnight became confused and diaphoretic.  He was febrile to 100.8 per EMS.  Cardiology is consulted because of his elevated troponin. ? ?Interval History: ?-much more alert today, does not remember much about what brought him to the hospital.  ?-no chest pain, troponin so far peaking at 6800, repeats pending ?-brief episode of AF overnight, terminating with IV dilt x1 ? ?Review of systems unable to assess due to patient somnolence, encephalopathy. ? ?Past Medical History:  ?Diagnosis Date  ? A-fib (HCC)   ? Abnormal heart rhythm   ? Arthritis   ? Asthma   ? CAP (community acquired pneumonia) 04/22/2016  ? Diabetes mellitus without complication (HCC)   ? Dysrhythmia   ? afib treated with flecainide  ? GERD (gastroesophageal reflux disease)   ? Hemoptysis 04/22/2016  ? Hypercholesteremia   ? Hypertension   ? Hyperthyroidism   ? Kidney stones   ? Sepsis (HCC) 04/22/2016  ?  ?Past Surgical History:  ?Procedure Laterality Date  ? CATARACT EXTRACTION W/PHACO Left 09/26/2016  ? Procedure: CATARACT EXTRACTION PHACO AND INTRAOCULAR LENS PLACEMENT (IOC);  Surgeon: King, Bradley Mark, MD;  Location: [facility] ORS;  Service: Ophthalmology;  Laterality: Left;  US 00:52.0 ?AP% 17.0 ?CDE  8.86 ?Fluid pack lot # [id number]  ? EYE SURGERY Right 2015  ? cataract  ? JOINT REPLACEMENT Right   ? knee  ? KNEE SURGERY Right 2015  ? total knee replacement revision  ?  ?Medications Prior to Admission  ?Medication Sig Dispense Refill Last Dose  ? aspirin EC 81 MG tablet Take 81 mg by mouth daily as needed.   06/20/2021  ? enalapril (VASOTEC) 10 MG tablet Take 10 mg by mouth daily.   06/20/2021  ? escitalopram (LEXAPRO) 20 MG tablet Take 40 mg by mouth daily.   06/20/2021  ? ezetimibe (ZETIA) 10 MG tablet Take 10 mg by mouth daily.   06/20/2021  ? flecainide (TAMBOCOR) 100 MG tablet Take 100 mg by mouth 2 (two) times daily.   06/20/2021  ? HYDROcodone-acetaminophen (NORCO) 10-325 MG tablet Take 1 tablet by mouth every 6 (six) hours as needed.   06/20/2021  ? metFORMIN (GLUCOPHAGE) 500 MG tablet Take 500 mg by mouth 2 (two) times daily with a meal.   06/20/2021  ? methimazole (TAPAZOLE) 10 MG tablet Take 10 mg by mouth 2 (two) times daily.    06/20/2021  ? metoprolol succinate (TOPROL-XL) 25 MG 24 hr tablet Take 0.5 tablets (12.5 mg total) by mouth daily. (Patient taking differently: Take 25 mg by mouth daily.) 30 tablet 0 06/20/2021  ? omeprazole (PRILOSEC) 20 MG capsule Take 20 mg by mouth 2 (two) times daily before a meal.   06/20/2021  ? pregabalin (LYRICA) 25 MG capsule Take 25 mg by mouth 3 (three) times daily.   06/20/2021  ?   traZODone (DESYREL) 50 MG tablet Take 50-150 mg by mouth at bedtime.   06/20/2021  ? warfarin (COUMADIN) 5 MG tablet Take 5 mg by mouth daily.   06/20/2021  ? albuterol (PROVENTIL HFA;VENTOLIN HFA) 108 (90 Base) MCG/ACT inhaler Inhale 1 puff into the lungs every 6 (six) hours as needed for wheezing or shortness of breath.    prn  ? Blood Glucose Monitoring Suppl (FIFTY50 GLUCOSE METER 2.0) w/Device KIT 1 each by Other route once. Use as instructed to check fasting BS daily.  ? ?Glucometer ; One-Touch     ? budesonide-formoterol (SYMBICORT) 160-4.5 MCG/ACT inhaler Inhale into the lungs. (Patient not  taking: Reported on 06/21/2021)   Not Taking  ? glucose blood test strip      ? glucose blood test strip USE AS DIRECTED     ? glucose blood test strip USE AS DIRECTED     ? lidocaine (LIDODERM) 5 % 1 patch daily.   prn  ? nitroGLYCERIN (NITROSTAT) 0.4 MG SL tablet Place 1 tablet under the tongue every 5 (five) minutes as needed.   prn  ? ? ?Social History  ? ?Socioeconomic History  ? Marital status: Married  ?  Spouse name: Not on file  ? Number of children: Not on file  ? Years of education: Not on file  ? Highest education level: Not on file  ?Occupational History  ? Not on file  ?Tobacco Use  ? Smoking status: Never  ? Smokeless tobacco: Current  ? Tobacco comments:  ?  uses dip a little  ?Vaping Use  ? Vaping Use: Never used  ?Substance and Sexual Activity  ? Alcohol use: No  ?  Alcohol/week: 0.0 standard drinks  ? Drug use: No  ? Sexual activity: Not on file  ?Other Topics Concern  ? Not on file  ?Social History Narrative  ? Not on file  ? ?Social Determinants of Health  ? ?Financial Resource Strain: Not on file  ?Food Insecurity: Not on file  ?Transportation Needs: Not on file  ?Physical Activity: Not on file  ?Stress: Not on file  ?Social Connections: Not on file  ?Intimate Partner Violence: Not on file  ?  ?Family History  ?Problem Relation Age of Onset  ? Dementia Mother   ? Heart disease Father   ?  ?PHYSICAL EXAM ?General: Elderly and ill-appearing Caucasian male, well nourished, in no acute distress. Sitting upright in Icu bed with wife at bedside ?HEENT:  Normocephalic and atraumatic. ?Neck:  No JVD.  ?Lungs: Normal respiratory effort on room air. Decreased breath sounds ?Heart: HRRR . Normal S1 and S2 without gallops. 2/6 systolic murmur. Radial & DP pulses 2+ bilaterally. ?Abdomen: Non-distended appearing.  ?Msk: Normal strength and tone for age. ?Extremities: Warm and well perfused. No clubbing, cyanosis.  No peripheral edema.  ?Neuro: awake, alert, answers questions appropriately ?Psych: judgement  and insight appropriate ? ?Labs: ?  ?Lab Results  ?Component Value Date  ? WBC 11.8 (H) 06/22/2021  ? HGB 10.8 (L) 06/22/2021  ? HCT 33.6 (L) 06/22/2021  ? MCV 90.6 06/22/2021  ? PLT 131 (L) 06/22/2021  ?  ?Recent Labs  ?Lab 06/21/21 ?0905 06/22/21 ?0324  ?NA 134* 140  ?K 5.7* 4.5  ?CL 104 108  ?CO2 24 26  ?BUN 31* 23  ?CREATININE 1.85* 1.67*  ?CALCIUM 8.5* 8.3*  ?PROT 7.3  --   ?BILITOT 1.3*  --   ?ALKPHOS 80  --   ?ALT 23  --   ?AST 37  --   ?  GLUCOSE 181* 127*  ? ? ?Lab Results  ?Component Value Date  ? TROPONINI <0.03 05/18/2016  ? ? No results found for: CHOL ?No results found for: HDL ?No results found for: LDLCALC ?No results found for: TRIG ?No results found for: CHOLHDL ?No results found for: LDLDIRECT  ?  ?Radiology: DG Chest 2 View ? ?Result Date: 06/21/2021 ?CLINICAL DATA:  Weakness EXAM: CHEST - 2 VIEW COMPARISON:  12/18/2016 FINDINGS: Moderate enlargement of the cardiopericardial silhouette with indistinct airspace opacities primarily at the lung bases and peripherally in the left mid lung, with associated obscuration of the hemidiaphragms. Faint Kerley B lines seen in the right upper lobe, bilateral Kerley a lines are present. Potential blunting of the left lateral costophrenic angle. Chronic wedging of upper thoracic vertebra, no change from 05/18/2016. Low lung volumes are present, causing crowding of the pulmonary vasculature. Old right posterolateral rib deformities. IMPRESSION: 1. Moderate enlargement of the cardiopericardial silhouette with bibasilar airspace opacities and Kerley B lines. The airspace opacities could be a manifestation of pulmonary edema, although aspiration or multilobar pneumonia is not excluded. 2. Indistinct left costophrenic angle laterally, cannot exclude small left pleural effusion. Electronically Signed   By: Walter  Liebkemann M.D.   On: 06/21/2021 09:41  ? ?DG Lumbar Spine Complete W/Bend ? ?Result Date: 06/09/2021 ?CLINICAL DATA:  Low back pain EXAM: LUMBAR SPINE -  COMPLETE WITH BENDING VIEWS COMPARISON:  12/08/2018 FINDINGS: Dextro scoliosis with left lateral osteophytes at multiple levels. Mild reversal of lumbar lordosis. Mild compression deformity at T12 probably chr

## 2021-06-22 NOTE — Consult Note (Signed)
ANTICOAGULATION CONSULT NOTE ? ?Pharmacy Consult for Heparin ?Indication: chest pain/ACS ? ?Allergies  ?Allergen Reactions  ? Statins Other (See Comments)  ?  Leg weakness  ? Sulfa Antibiotics Rash  ? ? ?Patient Measurements: ?Height: 6\' 2"  (188 cm) ?Weight: 90.7 kg (199 lb 15.3 oz) ?IBW/kg (Calculated) : 82.2 ?Heparin Dosing Weight: 94.8 kg ? ?Vital Signs: ?Temp: 97.9 ?F (36.6 ?C) (04/28 0100) ?Temp Source: Oral (04/28 0100) ?BP: 106/59 (04/28 0600) ?Pulse Rate: 71 (04/28 0600) ? ?Labs: ?Recent Labs  ?  06/21/21 ?0905 06/21/21 ?1147 06/21/21 ?1446 06/21/21 ?1652 06/21/21 ?06/23/21 06/21/21 ?2153 06/22/21 ?0324  ?HGB 12.4*  --   --   --   --   --  10.8*  ?HCT 38.8*  --   --   --   --   --  33.6*  ?PLT 172  --   --   --   --   --  131*  ?APTT  --   --  20*  --   --   --   --   ?LABPROT 15.7*  --  16.7*  --   --   --  18.7*  ?INR 1.3*  --  1.4*  --   --   --  1.6*  ?HEPARINUNFRC  --   --   --   --   --   --  0.47  ?CREATININE 1.85*  --   --   --   --   --  1.67*  ?TROPONINIHS 2,339*   < >  --  5,884* 6,597* 6,847*  --   ? < > = values in this interval not displayed.  ? ? ? ?Estimated Creatinine Clearance: 44.4 mL/min (A) (by C-G formula based on SCr of 1.67 mg/dL (H)). ? ? ?Medical History: ?Past Medical History:  ?Diagnosis Date  ? A-fib (HCC)   ? Abnormal heart rhythm   ? Arthritis   ? Asthma   ? CAP (community acquired pneumonia) 04/22/2016  ? Diabetes mellitus without complication (HCC)   ? Dysrhythmia   ? afib treated with flecainide  ? GERD (gastroesophageal reflux disease)   ? Hemoptysis 04/22/2016  ? Hypercholesteremia   ? Hypertension   ? Hyperthyroidism   ? Kidney stones   ? Sepsis (HCC) 04/22/2016  ? ? ?Medications:  ?No history of chronic anticoagulant use ? ?Assessment: ?Pharmacy has been consulted to initiate and monitor heparin in 75yo with past medical history of HFpEF, paroxysmal atrial fibrillation (on warfarin, metoprolol, and flecainide), bilateral carotid stenosis who presented to the ED with acute  encephalopathy. The patient was to have an elective colonoscopy 06/21/21 and was taking bowel prep over the past 24 hours but overnight became confused and diaphoretic. Patient with troponin levels 2339, 3794, and 5884. Baseline labs: aPTT 20sec, INR 1.4, Hgb 12.4, Plts 172. ? ?Goal of Therapy:  ?Heparin level 0.3-0.7 units/ml ?Monitor platelets by anticoagulation protocol: Yes ?  ?Plan:  ?Heparin level remains therapeutic ?This is the second consecutive therapeutic level: will move to once daily heparin levels ?Continue heparin infusion at 1300 units/hr ?Daily CBC while on IV heparin ? ? ?06/23/21 ?06/22/2021,7:06 AM ? ? ?

## 2021-06-22 NOTE — Progress Notes (Signed)
Pharmacy Antibiotic Note ? ?Alan Stafford is a 75yo with past medical history of HFpEF, paroxysmal atrial fibrillation (on warfarin, metoprolol, and flecainide), bilateral carotid stenosis who presented to the ED with acute encephalopathy. The patient was to have an elective colonoscopy 06/21/21 and was taking bowel prep over the past 24 hours but overnight became confused and diaphoretic.Marland Kitchen  Pharmacy has been consulted for Unasyn dosing for PNA. ? ?Plan: start Unasyn 3 grams IV every 6 hours ?--follow renal function for dose adjustments ? ?Height: 6\' 2"  (188 cm) ?Weight: 90.7 kg (199 lb 15.3 oz) ?IBW/kg (Calculated) : 82.2 ? ?Temp (24hrs), Avg:98.2 ?F (36.8 ?C), Min:97.9 ?F (36.6 ?C), Max:98.5 ?F (36.9 ?C) ? ?Recent Labs  ?Lab 06/21/21 ?0905 06/21/21 ?1147 06/21/21 ?1652 06/21/21 ?06/23/21 06/22/21 ?0324  ?WBC 18.2*  --   --   --  11.8*  ?CREATININE 1.85*  --   --   --  1.67*  ?LATICACIDVEN 1.8 2.2* 2.1* 1.6  --   ?  ?Estimated Creatinine Clearance: 44.4 mL/min (A) (by C-G formula based on SCr of 1.67 mg/dL (H)).   ? ?Allergies  ?Allergen Reactions  ? Statins Other (See Comments)  ?  Leg weakness  ? Sulfa Antibiotics Rash  ? ? ?Antimicrobials this admission: ?04/27 azithromycin x 1 ?04/27 ceftriaxone x 1 ?04/28 Unasyn >>  ? ? ?Microbiology results: ?04/27 BCx: NG < 24h ?04/28 Sputum: pending  ?04/27 MRSA PCR: positive ? ?Thank you for allowing pharmacy to be a part of this patient?s care. ? ?5/27 ?06/22/2021 8:27 AM ? ?

## 2021-06-22 NOTE — TOC Initial Note (Signed)
Transition of Care (TOC) - Initial/Assessment Note  ? ? ?Patient Details  ?Name: Alan Stafford ?MRN: [id number] ?Date of Birth: [birth year] ? ?Transition of Care (TOC) CM/SW Contact:    ?Candie Chroman, LCSW ?Phone Number: ?06/22/2021, 4:11 PM ? ?Clinical Narrative:   Readmission prevention screen complete. CSW met with patient. Wife and other family members at bedside. CSW introduced role and explained that discharge planning would be discussed. Patient lives home with his wife. PCP is Raelene Bott, MD in [place]. Patient drives himself to appointments. [facility] [place] No issues obtaining medications. No home health prior to admission. He has a RW and SPC at home. His wife will transport him home at discharge. No further concerns. CSW encouraged patient and his family to contact CSW as needed. CSW will continue to follow patient and his family for support and facilitate return home when stable.             ? ?Expected Discharge Plan: Home/Self Care ?Barriers to Discharge: Continued Medical Work up ? ? ?Patient Goals and CMS Choice ?  ?  ?  ? ?Expected Discharge Plan and Services ?Expected Discharge Plan: Home/Self Care ?  ?  ?Post Acute Care Choice: NA ?Living arrangements for the past 2 months: [place] ?                ?  ?  ?  ?  ?  ?  ?  ?  ?  ?  ? ?Prior Living Arrangements/Services ?Living arrangements for the past 2 months: [place] ?Lives with:: Spouse ?Patient language and need for interpreter reviewed:: Yes ?Do you feel safe going back to the place where you live?: Yes      ?Need for Family Participation in Patient Care: Yes (Comment) ?Care giver support system in place?: Yes (comment) ?Current home services: DME ?Criminal Activity/Legal Involvement Pertinent to Current Situation/Hospitalization: No - Comment as needed ? ?Activities of Daily Living ?Home Assistive Devices/Equipment: Cane (specify quad or straight) ?ADL Screening (condition at time of admission) ?Patient's  cognitive ability adequate to safely complete daily activities?: Yes ?Is the patient deaf or have difficulty hearing?: No ?Does the patient have difficulty seeing, even when wearing glasses/contacts?: No ?Does the patient have difficulty concentrating, remembering, or making decisions?: No ?Patient able to express need for assistance with ADLs?: Yes ?Does the patient have difficulty dressing or bathing?: No ?Independently performs ADLs?: Yes (appropriate for developmental age) ?Does the patient have difficulty walking or climbing stairs?: Yes ?Weakness of Legs: Both ?Weakness of Arms/Hands: None ? ?Permission Sought/Granted ?Permission sought to share information with : Family Supports ?  ? Share Information with NAME: Lary Eckardt ?   ? Permission granted to share info w Relationship: Family at bedside ? Permission granted to share info w Contact Information: [phone] ? ?Emotional Assessment ?Appearance:: Appears stated age ?Attitude/Demeanor/Rapport: Engaged, Gracious ?Affect (typically observed): Accepting, Appropriate, Calm, Pleasant ?Orientation: : Oriented to Self, Oriented to Place, Oriented to  Time, Oriented to Situation ?Alcohol / Substance Use: Not Applicable ?Psych Involvement: No (comment) ? ?Admission diagnosis:  NSTEMI (non-ST elevated myocardial infarction) ([place]) [I21.4] ?Sepsis ([place]) [A41.9] ?Community acquired pneumonia, unspecified laterality [J18.9] ?Sepsis with acute organ dysfunction without septic shock, due to unspecified organism, unspecified type ([place]) [A41.9, R65.20] ?Patient Active Problem List  ? Diagnosis Date Noted  ? History of CVA (cerebrovascular accident) 06/22/2021  ? Hyperkalemia 06/21/2021  ? CKD stage 3 due to type 2 diabetes mellitus ([place]) 06/21/2021  ?  Sepsis (HCC) 06/21/2021  ? NSTEMI (non-ST elevated myocardial infarction) (HCC) 06/21/2021  ? Acute metabolic encephalopathy 06/21/2021  ? Fall at home, initial encounter (06/02/2021) 06/05/2021  ? Osteoarthritis of hip  (Right) 12/02/2019  ? Shoulder pain, left 11/03/2019  ? Occipital stroke (HCC) 09/15/2019  ? Bilateral hand pain 06/11/2019  ? Chronic pain of right knee 03/29/2019  ? Osteoarthritis of hip (Left) 12/02/2018  ? Chronic sacroiliac joint pain (Right) 10/29/2018  ? Abnormal MRI, lumbar spine (03/24/2019) 09/17/2018  ? Compression fracture of L2 lumbar vertebra, sequela 09/17/2018  ? Lumbosacral spinal stenosis (Multilevel) 09/17/2018  ? Chronic anticoagulation (Coumadin) 09/01/2018  ? Bilateral carotid artery stenosis 05/05/2018  ? Chronic low back pain (2ry area of Pain) (Bilateral) (R>L) w/ sciatica (Bilateral) 08/13/2017  ? Chronic pain syndrome 08/05/2016  ? Diabetic peripheral neuropathy (HCC) 08/05/2016  ? Chronic systolic heart failure (HCC) 06/06/2016  ? Acute on chronic systolic congestive heart failure (HCC) 04/23/2016  ? Chronic combined systolic and diastolic congestive heart failure (HCC) 04/23/2016  ? CAP (community acquired pneumonia) 04/22/2016  ? Controlled substance agreement signed 10/05/2015  ? Benign non-nodular prostatic hyperplasia with lower urinary tract symptoms 10/05/2015  ? Cervical facet syndrome (Bilateral) (R>L) 08/17/2015  ? Osteoarthritis of knee (Left) 08/17/2015  ? Encounter for therapeutic drug level monitoring 01/17/2015  ? Encounter for chronic pain management 01/17/2015  ? Opioid dependence (HCC) 01/17/2015  ? Lumbar spondylosis 01/03/2015  ? CAD in native artery 01/03/2015  ? Acid reflux 01/03/2015  ? Cardiac murmur 01/03/2015  ? Hypertriglyceridemia 01/03/2015  ? Angina pectoris (HCC) 01/03/2015  ? Long term current use of anticoagulant therapy (Coumadin) 01/03/2015  ? Bulging lumbar disc (T12-L1, L1-2, L2-3, and L4-5) 01/03/2015  ? Lumbar foraminal stenosis (Multilevel) (Severe) (Right: L4-5; Left: L3-4) 01/03/2015  ? Chronic lower extremity pain (1ry area of Pain) (Bilateral) (R>L) 01/03/2015  ? Chronic hip pain (Right) 01/03/2015  ? Chronic lumbar radicular pain (Right)  01/03/2015  ? Myofascial pain syndrome (right suprascapular muscle) 01/03/2015  ? Chronic neck pain (Bilateral) (R>L) 01/03/2015  ? Chronic shoulder pain (Right) 01/03/2015  ? Cervical spondylosis 01/03/2015  ? Chronic cervical radicular pain (Right) 01/03/2015  ? Chronic knee pain (3ry area of Pain) (Left) 01/03/2015  ? Long term current use of opiate analgesic 01/03/2015  ? Long term prescription opiate use 01/03/2015  ? Opiate use 01/03/2015  ? Compression fracture of thoracic vertebra (HCC) (T3, T4, and T8) 01/03/2015  ? Cervical facet hypertrophy (Bilateral C3-4) 01/03/2015  ? Cervical nerve root disorder 01/03/2015  ? Closed wedge fracture of thoracic vertebra, sequela (T3, T4, and T8) 01/03/2015  ? Atrial fibrillation (HCC) 04/21/2014  ? Hypotestosteronism 10/05/2013  ? Low testosterone 10/05/2013  ? Encounter for screening for other disorder 07/05/2013  ? Drug intolerance 07/05/2013  ? Stage 3b chronic kidney disease (HCC) 12/31/2012  ? Airway hyperreactivity 09/30/2012  ? Hyperthyroidism 09/30/2012  ? Asthma 09/30/2012  ? Essential hypertension 09/25/2012  ? Hypercholesterolemia 09/25/2012  ? Paroxysmal atrial fibrillation (HCC) 09/25/2012  ? Type 2 diabetes mellitus with vascular disease (HCC) 09/25/2012  ? DDD (degenerative disc disease), lumbosacral 09/25/2012  ? ?PCP:  Hoffman, Byron, MD ?Pharmacy:   ?[id number] [place] [place] - [address] [facility] ?[address] ?[address] [address] ?Phone: [phone] Fax: [phone] ? ?[id number] - [place], [place] - [address] ?[address] ?[place] [address] ?Phone: [phone] Fax: [phone] ? ? ? ? ?Social Determinants of Health (SDOH) Interventions ?  ? ?  Readmission Risk Interventions ? ?  06/22/2021  ?  4:08 PM  ?Readmission Risk Prevention Plan  ?Transportation Screening Complete  ?PCP or Specialist Appt within 3-5 Days Complete  ?Social Work Consult for Recovery Care  Planning/Counseling Complete  ?Palliative Care Screening Not Applicable  ?Medication Review (RN Care Manager) Complete  ? ? ? ?

## 2021-06-22 NOTE — Progress Notes (Signed)
?   06/22/21 0900  ?Clinical Encounter Type  ?Visited With Patient and family together  ?Visit Type Initial  ? ?Chaplain introduced and offered Chaplain services to provide support ?

## 2021-06-22 NOTE — Plan of Care (Signed)
  Problem: Education: Goal: Knowledge of General Education information will improve Description Including pain rating scale, medication(s)/side effects and non-pharmacologic comfort measures Outcome: Progressing   Problem: Health Behavior/Discharge Planning: Goal: Ability to manage health-related needs will improve Outcome: Progressing   Problem: Clinical Measurements: Goal: Ability to maintain clinical measurements within normal limits will improve Outcome: Progressing Goal: Will remain free from infection Outcome: Progressing Goal: Diagnostic test results will improve Outcome: Progressing Goal: Cardiovascular complication will be avoided Outcome: Progressing   Problem: Activity: Goal: Risk for activity intolerance will decrease Outcome: Progressing   Problem: Pain Managment: Goal: General experience of comfort will improve Outcome: Progressing   Problem: Safety: Goal: Ability to remain free from injury will improve Outcome: Progressing   

## 2021-06-22 NOTE — TOC CM/SW Note (Signed)
Went by room for readmission prevention screen but patient was having a test done. Will try again later. ? ?Dayton Scrape, [place] ?[phone] ? ?

## 2021-06-22 NOTE — Progress Notes (Signed)
*  PRELIMINARY RESULTS* ?Echocardiogram ?2D Echocardiogram has been performed. ? ?Alan Stafford, Alan Stafford ?06/22/2021, 2:39 PM ?

## 2021-06-23 DIAGNOSIS — J189 Pneumonia, unspecified organism: Secondary | ICD-10-CM | POA: Diagnosis not present

## 2021-06-23 LAB — GASTROINTESTINAL PANEL BY PCR, STOOL (REPLACES STOOL CULTURE)

## 2021-06-23 LAB — BASIC METABOLIC PANEL
Anion gap: 7 (ref 5–15)
BUN: 22 mg/dL (ref 8–23)
CO2: 24 mmol/L (ref 22–32)
Calcium: 7.9 mg/dL — ABNORMAL LOW (ref 8.9–10.3)
Chloride: 105 mmol/L (ref 98–111)
Creatinine, Ser: 1.68 mg/dL — ABNORMAL HIGH (ref 0.61–1.24)
GFR, Estimated: 42 mL/min — ABNORMAL LOW (ref >60)
Glucose, Bld: 120 mg/dL — ABNORMAL HIGH (ref 70–99)
Potassium: 4.2 mmol/L (ref 3.5–5.1)
Sodium: 136 mmol/L (ref 135–145)

## 2021-06-23 LAB — ECHOCARDIOGRAM COMPLETE
AR max vel: 1.8 cm2
AV Area VTI: 2.04 cm2
AV Area mean vel: 1.67 cm2
AV Mean grad: 7.7 mmHg
AV Peak grad: 12.4 mmHg
Ao pk vel: 1.76 m/s
Area-P 1/2: 3.43 cm2
Calc EF: 41.4 %
Height: 74 in
MV VTI: 1.96 cm2
S' Lateral: 2.9 cm
Single Plane A2C EF: 37.7 %
Single Plane A4C EF: 43 %
Weight: 3199.32 oz

## 2021-06-23 LAB — EXPECTORATED SPUTUM ASSESSMENT W GRAM STAIN, RFLX TO RESP C

## 2021-06-23 LAB — CBC
HCT: 29.2 % — ABNORMAL LOW (ref 39.0–52.0)
Hemoglobin: 9.3 g/dL — ABNORMAL LOW (ref 13.0–17.0)
MCH: 29 pg (ref 26.0–34.0)
MCHC: 31.8 g/dL (ref 30.0–36.0)
MCV: 91 fL (ref 80.0–100.0)
Platelets: 133 10*3/uL — ABNORMAL LOW (ref 150–400)
RBC: 3.21 MIL/uL — ABNORMAL LOW (ref 4.22–5.81)
RDW: 14.2 % (ref 11.5–15.5)
WBC: 8.1 10*3/uL (ref 4.0–10.5)
nRBC: 0 % (ref 0.0–0.2)

## 2021-06-23 LAB — HEPARIN LEVEL (UNFRACTIONATED): Heparin Unfractionated: 0.35 IU/mL (ref 0.30–0.70)

## 2021-06-23 LAB — HEMOGLOBIN: Hemoglobin: 9.1 g/dL — ABNORMAL LOW (ref 13.0–17.0)

## 2021-06-23 MED ORDER — PANTOPRAZOLE SODIUM 40 MG IV SOLR
40.0000 mg | Freq: Two times a day (BID) | INTRAVENOUS | Status: DC
  Administered 2021-06-23 – 2021-06-24 (×3): 40 mg via INTRAVENOUS
  Filled 2021-06-23 (×3): qty 10

## 2021-06-23 NOTE — Progress Notes (Signed)
[facility] Cardiology ? ?SUBJECTIVE: Patient laying in bed, denies chest pain or shortness of breath ? ? ?Vitals:  ? 06/22/21 1952 06/22/21 2336 06/23/21 0351 06/23/21 0912  ?BP: 123/64 113/62 115/79 127/65  ?Pulse: 67 69 (!) 107 62  ?Resp: 17  18 17   ?Temp: 98 ?F (36.7 ?C) 98.7 ?F (37.1 ?C) 98.2 ?F (36.8 ?C) 98.2 ?F (36.8 ?C)  ?TempSrc: Oral Oral Oral   ?SpO2: 100% 96%  93%  ?Weight:      ?Height:      ? ? ? ?Intake/Output Summary (Last 24 hours) at 06/23/2021 1102 ?Last data filed at 06/23/2021 [id number] ?Gross per 24 hour  ?Intake 1764.56 ml  ?Output 850 ml  ?Net 914.56 ml  ? ? ? ? ?PHYSICAL EXAM ? ?General: Well developed, well nourished, in no acute distress ?HEENT:  Normocephalic and atramatic ?Neck:  No JVD.  ?Lungs: Clear bilaterally to auscultation and percussion. ?Heart: HRRR . Normal S1 and S2 without gallops or murmurs.  ?Abdomen: Bowel sounds are positive, abdomen soft and non-tender  ?Msk:  Back normal, normal gait. Normal strength and tone for age. ?Extremities: No clubbing, cyanosis or edema.   ?Neuro: Alert and oriented X 3. ?Psych:  Good affect, responds appropriately ? ? ?LABS: ?Basic Metabolic Panel: ?Recent Labs  ?  06/21/21 ?0905 06/22/21 ?0324 06/23/21 ?0451  ?NA 134* 140 136  ?K 5.7* 4.5 4.2  ?CL 104 108 105  ?CO2 24 26 24   ?GLUCOSE 181* 127* 120*  ?BUN 31* 23 22  ?CREATININE 1.85* 1.67* 1.68*  ?CALCIUM 8.5* 8.3* 7.9*  ?MG 2.0  --   --   ? ?Liver Function Tests: ?Recent Labs  ?  06/21/21 ?0905  ?AST 37  ?ALT 23  ?ALKPHOS 80  ?BILITOT 1.3*  ?PROT 7.3  ?ALBUMIN 3.6  ? ?No results for input(s): LIPASE, AMYLASE in the last 72 hours. ?CBC: ?Recent Labs  ?  06/21/21 ?0905 06/22/21 ?0324 06/23/21 ?0451  ?WBC 18.2* 11.8* 8.1  ?NEUTROABS 16.0*  --   --   ?HGB 12.4* 10.8* 9.3*  ?HCT 38.8* 33.6* 29.2*  ?MCV 90.9 90.6 91.0  ?PLT 172 131* 133*  ? ?Cardiac Enzymes: ?No results for input(s): CKTOTAL, CKMB, CKMBINDEX, TROPONINI in the last 72 hours. ?BNP: ?Invalid input(s): POCBNP ?D-Dimer: ?No results for input(s): DDIMER  in the last 72 hours. ?Hemoglobin A1C: ?No results for input(s): HGBA1C in the last 72 hours. ?Fasting Lipid Panel: ?No results for input(s): CHOL, HDL, LDLCALC, TRIG, CHOLHDL, LDLDIRECT in the last 72 hours. ?Thyroid Function Tests: ?No results for input(s): TSH, T4TOTAL, T3FREE, THYROIDAB in the last 72 hours. ? ?Invalid input(s): FREET3 ?Anemia Panel: ?No results for input(s): VITAMINB12, FOLATE, FERRITIN, TIBC, IRON, RETICCTPCT in the last 72 hours. ? ?MR BRAIN WO CONTRAST ? ?Result Date: 06/21/2021 ?CLINICAL DATA:  Acute neuro deficit. EXAM: MRI HEAD WITHOUT CONTRAST TECHNIQUE: Multiplanar, multiecho pulse sequences of the brain and surrounding structures were obtained without intravenous contrast. COMPARISON:  CT head 06/21/2021 FINDINGS: Brain: Negative for acute infarct. Ventricle size and cerebral volume within normal limits. Negative for hemorrhage or mass Chronic infarct left occipital parietal lobe. Mild white matter changes bilaterally. Vascular: Normal arterial flow voids at the skull base Skull and upper cervical spine: No focal skull lesion. Sinuses/Orbits: Mild mucosal edema paranasal sinuses. Bilateral cataract extraction Other: None IMPRESSION: Negative for acute infarct Chronic infarct left occipital parietal lobe. Electronically Signed   By: Franchot Gallo M.D.   On: 06/21/2021 15:56   ? ? ?Echo pending ? ?TELEMETRY: Sinus  rhythm: ? ?ASSESSMENT AND PLAN: ? ?Active Problems: ?  Atrial fibrillation ([place]) ?  Essential hypertension ?  Hyperthyroidism ?  Paroxysmal atrial fibrillation (HCC) ?  Type 2 diabetes mellitus with vascular disease ([place]) ?  CAP (community acquired pneumonia) ?  Chronic systolic heart failure ([place]) ?  Chronic pain syndrome ?  Hyperkalemia ?  CKD stage 3 due to type 2 diabetes mellitus ([place]) ?  Sepsis ([place]) ?  NSTEMI (non-ST elevated myocardial infarction) ([place]) ?  Acute metabolic encephalopathy ?  History of CVA (cerebrovascular accident) ?  ? ?1.  Non-STEMI, (5884, [id number], [id number],  6246, [id number]) atypical presentation, acute encephalopathy without chest pain or ischemic ECG changes ?2.  HFpEF, LVEF 45 to 50%, BNP 223 on admission ?3.  Paroxysmal atrial fibrillation, DC on flecainide for rhythm control, on metoprolol succinate for rate control, warfarin being held, on heparin drip ?4.  Acute encephalopathy, brain bowel prep for colonoscopy, patient now lucid, head CT negative, MRI did not reveal any acute abnormality, but showed chronic left occipital parietal infarct ? ?Recommendations ? ?1.  Continue current medication ?2.  Continue heparin infusion ?3.  Continue to hold flecainide ?4.  Patient scheduled for cardiac catheterization on 06/25/2021.  The risk, benefits alternatives of cardiac catheterization and possible PCI were explained to the patient and informed written consent was obtained ? ? ?Isaias Cowman, MD, PhD, [facility] ?06/23/2021 ?11:02 AM ? ? ? ?  ?

## 2021-06-23 NOTE — Progress Notes (Addendum)
?PROGRESS NOTE ? ? ? ?Alan Stafford  [id number] DOB: 09/27/1945 DOA: 06/21/2021 ?PCP: Raelene Bott, MD  ?Outpatient Specialists: cardiology ? ? ? ?Brief Narrative:  ? ?Alan Stafford is a 76 y.o. 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 ?They state that patient was in his usual state of health and was scheduled for colonoscopy on the day of presentation.  He has been off his Coumadin for about 4 days for the planned procedure and had started taking his prep for the colonoscopy the day prior to his admission.  His wife states that later that night he became very lethargic, sweaty and confused.  She also states that he had shaking chills.  She checked a home COVID-19 test which was negative.  She called EMS due to his worsening symptoms. ?Per EMS he had a temp of 100.8 and his blood sugar was 191. ?During my evaluation he is very lethargic and only arouses to deep sternal rub. ? ? ? ?Assessment & Plan: ?  ?Active Problems: ?  Chronic pain syndrome ?  Sepsis ([place]) ?  CAP (community acquired pneumonia) ?  Acute metabolic encephalopathy ?  NSTEMI (non-ST elevated myocardial infarction) ([place]) ?  Hyperkalemia ?  CKD stage 3 due to type 2 diabetes mellitus ([place]) ?  Paroxysmal atrial fibrillation (HCC) ?  Chronic systolic heart failure ([place]) ?  Essential hypertension ?  Atrial fibrillation ([place]) ?  Hyperthyroidism ?  Type 2 diabetes mellitus with vascular disease ([place]) ?  History of CVA (cerebrovascular accident) ? ?# Acute metabolic encephalopathy ?resolved. Precise etiology unclear. NSTEMI, pneumonia, electrolyte derangement from colonic prep, dehydration from colonic prep, on chronic medical conditions including history CVA. ? - monitor ? ?# CAP,  question aspiration ?# Sepsis ?Sepsis by fever, leukocytosis. No clear inciting aspiration event. Was in process of prep at time of mental status changes. CTA no PE but lower lobe process on CT suggestive of multifocal pneumonia or aspiration. Not requiring O2. Passed bedside swallow eval. Having blood tinged sputum ?- cont unasyn; abx started 4/27 ?- further w/u if blood-tinged sputum worsens ?- f/u blood cultures; sputum for culture, legionella urine antigen ? ?# Dark stool ?# Anemia ?Patient says dark brown, not black. No hx gib but is on heparin and says plan for colonoscopy was because colon cancer stool screening positive for blood. Hgb has dropped from 12.4 on admission to 9.3 today. Is on warfarin chronically. ?- stopping heparin, has been 48 hours. Holding on resuming coumadin ?- RN to place hat in commode and examine next BM ?- repeat hgb this pm, if continues to drop would stop heparin, consult GI ?- will start ppi IV bid ? ?# NSTEMI ?Asymptomatic. Cardiology following. Thought to be demand. Troponin plateau 6800. Hemodynamically stable. 3 vessel disease seen on CT. Received asa. Mod midral regurg on tte, otherwise unremarkable ?- 48 hours IV heparin, started 17:45 on 4/27, will stop this pm ?- cont aspirin, statin, zetia ?- plan is for [facility] 5/1 ? ?# Hyperkalemia ?Possibly 2/2 colonic prep. Resolved ?- monitor ? ?# HFpEF ?# Moderate mitral regurg ?No other abnormalities on tte this hospitalization ?- cont home metoprolol ? ?# CKD 3b ?Kidney function is at baseline ? ?# A fib ?Rate controlled ?- cont home metoprolol ?- cardiology to resume home flecainide when they  think appropriate ?- home warfarin on hold; heparin continuing as above ? ?# History CVA ?No acute stroke on MRI ?- home meds ? ?# Hyperthyroid ?No signs agranulocytosis on cbc ?- cont home methimazole ? ?# Chronic pain ?- resume home lyrica, cont home hydrocodone ? ?# HTN ?Home enalapril on hold 2/2 soft BPs ? ? ?DVT prophylaxis: therapeutic  heparin ?Code Status: full ?Family Communication: daughter updated @ bedside 4/29 ? ?Level of care: Telemetry Medical ?Status is: Inpatient ?Remains inpatient appropriate because: severity of illness ? ? ? ?Consultants:  ?cardiology ? ?Procedures: ?none ? ?Antimicrobials:  ?Azithromycin/ceftriaxone 4/27-4/28 ?Unasyn 4/28>  ? ? ?Subjective: ?No chest pain. Cough productive, blood tinged and dark. Dark loose stool ? ?Objective: ?Vitals:  ? 06/22/21 2336 06/23/21 0351 06/23/21 0912 06/23/21 1113  ?BP: 113/62 115/79 127/65 110/72  ?Pulse: 69 (!) 107 62 72  ?Resp:  18 17 16   ?Temp: 98.7 ?F (37.1 ?C) 98.2 ?F (36.8 ?C) 98.2 ?F (36.8 ?C) 98.7 ?F (37.1 ?C)  ?TempSrc: Oral Oral  Oral  ?SpO2: 96%  93% 94%  ?Weight:      ?Height:      ? ? ?Intake/Output Summary (Last 24 hours) at 06/23/2021 1452 ?Last data filed at 06/23/2021 1415 ?Gross per 24 hour  ?Intake 1381.32 ml  ?Output 850 ml  ?Net 531.32 ml  ? ?Filed Weights  ? 06/21/21 0902 06/21/21 2110  ?Weight: 94.8 kg 90.7 kg  ? ? ?Examination: ? ?General exam: Appears calm and comfortable  ?Respiratory system: Clear to auscultation save for rales at bases. Respiratory effort normal. ?Cardiovascular system: S1 & S2 heard, RRR. No JVD, soft systolic murmur, no rubs, gallops or clicks. No pedal edema. ?Gastrointestinal system: Abdomen is nondistended, soft and nontender. No organomegaly or masses felt. Normal bowel sounds heard. ?Central nervous system: Alert and oriented. No focal neurological deficits. ?Extremities: Symmetric 5 x 5 power. ?Skin: No rashes, lesions or ulcers ?Psychiatry: Judgement and insight appear normal. Mood & affect appropriate.  ? ? ? ?Data Reviewed: I have personally reviewed following labs and imaging studies ? ?CBC: ?Recent Labs  ?Lab 06/21/21 ?0905 06/22/21 ?0324 06/23/21 ?0451  ?WBC 18.2* 11.8* 8.1  ?NEUTROABS 16.0*  --   --   ?HGB 12.4* 10.8* 9.3*  ?HCT 38.8* 33.6* 29.2*  ?MCV 90.9 90.6 91.0  ?PLT 172 131* 133*  ? ?Basic Metabolic Panel: ?Recent Labs   ?Lab 06/21/21 ?0905 06/22/21 ?0324 06/23/21 ?0451  ?NA 134* 140 136  ?K 5.7* 4.5 4.2  ?CL 104 108 105  ?CO2 24 26 24   ?GLUCOSE 181* 127* 120*  ?BUN 31* 23 22  ?CREATININE 1.85* 1.67* 1.68*  ?CALCIUM 8.5* 8.3* 7.9*  ?MG 2.0  --   --   ? ?GFR: ?Estimated Creatinine Clearance: 44.2 mL/min (A) (by C-G formula based on SCr of 1.68 mg/dL (H)). ?Liver Function Tests: ?Recent Labs  ?Lab 06/21/21 ?[id number]  ?AST 37  ?ALT 23  ?ALKPHOS 80  ?BILITOT 1.3*  ?PROT 7.3  ?ALBUMIN 3.6  ? ?No results for input(s): LIPASE, AMYLASE in the last 168 hours. ?No results for input(s): AMMONIA in the last 168 hours. ?Coagulation Profile: ?Recent Labs  ?Lab 06/21/21 ?[id number] 06/21/21 ?1446 06/22/21 ?0324  ?INR 1.3* 1.4* 1.6*  ? ?Cardiac Enzymes: ?No results for input(s): CKTOTAL, CKMB, CKMBINDEX, TROPONINI in the last 168 hours. ?BNP (last 3 results) ?No results for input(s): PROBNP in the last 8760 hours. ?HbA1C: ?No results for input(s): HGBA1C in the last 72 hours. ?CBG: ?Recent Labs  ?Lab 06/21/21 ?0901 06/21/21 ?  1259 06/21/21 ?2111  ?GLUCAP 167* 158* 139*  ? ?Lipid Profile: ?No results for input(s): CHOL, HDL, LDLCALC, TRIG, CHOLHDL, LDLDIRECT in the last 72 hours. ?Thyroid Function Tests: ?No results for input(s): TSH, T4TOTAL, FREET4, T3FREE, THYROIDAB in the last 72 hours. ?Anemia Panel: ?No results for input(s): VITAMINB12, FOLATE, FERRITIN, TIBC, IRON, RETICCTPCT in the last 72 hours. ?Urine analysis: ?   ?Component Value Date/Time  ? COLORURINE STRAW (A) 06/21/2021 1652  ? APPEARANCEUR CLEAR (A) 06/21/2021 1652  ? APPEARANCEUR Clear 12/07/2019 1531  ? LABSPEC 1.015 06/21/2021 1652  ? PHURINE 6.0 06/21/2021 1652  ? GLUCOSEU 50 (A) 06/21/2021 1652  ? HGBUR SMALL (A) 06/21/2021 1652  ? [place] NEGATIVE 06/21/2021 1652  ? BILIRUBINUR Negative 12/07/2019 1531  ? [place] NEGATIVE 06/21/2021 1652  ? [place] NEGATIVE 06/21/2021 1652  ? NITRITE NEGATIVE 06/21/2021 1652  ? LEUKOCYTESUR NEGATIVE 06/21/2021 1652  ? ?Sepsis  Labs: ?@[email or web address] (procalcitonin:4,lacticidven:4) ? ?) ?Recent Results (from the past 240 hour(s))  ?MRSA Next Gen by PCR, Nasal     Status: Abnormal  ? Collection Time: 06/21/21  8:32 AM  ? Specimen: Nasal Mucosa; Nasal Swab  ?Res

## 2021-06-23 NOTE — Consult Note (Signed)
ANTICOAGULATION CONSULT NOTE ? ?Pharmacy Consult for Heparin ?Indication: chest pain/ACS ? ?Allergies  ?Allergen Reactions  ? Statins Other (See Comments)  ?  Leg weakness  ? Sulfa Antibiotics Rash  ? ? ?Patient Measurements: ?Height: 6\' 2"  (188 cm) ?Weight: 90.7 kg (199 lb 15.3 oz) ?IBW/kg (Calculated) : 82.2 ?Heparin Dosing Weight: 94.8 kg ? ?Vital Signs: ?Temp: 98.2 ?F (36.8 ?C) (04/29 0351) ?Temp Source: Oral (04/29 0351) ?BP: 115/79 (04/29 0351) ?Pulse Rate: 107 (04/29 0351) ? ?Labs: ?Recent Labs  ?  06/21/21 ?0905 06/21/21 ?1147 06/21/21 ?1446 06/21/21 ?1652 06/21/21 ?2153 06/22/21 ?0324 06/22/21 ?0747 06/22/21 ?06/24/21 06/23/21 ?0451  ?HGB 12.4*  --   --   --   --  10.8*  --   --  9.3*  ?HCT 38.8*  --   --   --   --  33.6*  --   --  29.2*  ?PLT 172  --   --   --   --  131*  --   --  133*  ?APTT  --   --  20*  --   --   --   --   --   --   ?LABPROT 15.7*  --  16.7*  --   --  18.7*  --   --   --   ?INR 1.3*  --  1.4*  --   --  1.6*  --   --   --   ?HEPARINUNFRC  --   --   --   --   --  0.47  --  0.46 0.35  ?CREATININE 1.85*  --   --   --   --  1.67*  --   --  1.68*  ?TROPONINIHS 2,339*   < >  --    < > 6,847*  --  6,245* 5,732*  --   ? < > = values in this interval not displayed.  ? ? ? ?Estimated Creatinine Clearance: 44.2 mL/min (A) (by C-G formula based on SCr of 1.68 mg/dL (H)). ? ? ?Medical History: ?Past Medical History:  ?Diagnosis Date  ? A-fib (HCC)   ? Abnormal heart rhythm   ? Arthritis   ? Asthma   ? CAP (community acquired pneumonia) 04/22/2016  ? Diabetes mellitus without complication (HCC)   ? Dysrhythmia   ? afib treated with flecainide  ? GERD (gastroesophageal reflux disease)   ? Hemoptysis 04/22/2016  ? Hypercholesteremia   ? Hypertension   ? Hyperthyroidism   ? Kidney stones   ? Sepsis (HCC) 04/22/2016  ? ? ?Medications:  ?No history of chronic anticoagulant use ? ?Assessment: ?Pharmacy has been consulted to initiate and monitor heparin in 76yo with past medical history of HFpEF, paroxysmal atrial  fibrillation (on warfarin, metoprolol, and flecainide), bilateral carotid stenosis who presented to the ED with acute encephalopathy. The patient was to have an elective colonoscopy 06/21/21 and was taking bowel prep over the past 24 hours but overnight became confused and diaphoretic. Patient with troponin levels 2339, 3794, and 5884. Baseline labs: aPTT 20sec, INR 1.4, Hgb 12.4, Plts 172. ? ?Goal of Therapy:  ?Heparin level 0.3-0.7 units/ml ?Monitor platelets by anticoagulation protocol: Yes ?  ?Plan:  ?4/29:  HL @ 0451 = 0.35, therapeutic X 3  ?Will continue pt on current rate and recheck HL on 4/30 with AM labs.  ? ?Kavya Haag D ?06/23/2021,5:55 AM ? ? ?

## 2021-06-24 DIAGNOSIS — J189 Pneumonia, unspecified organism: Secondary | ICD-10-CM | POA: Diagnosis not present

## 2021-06-24 LAB — BASIC METABOLIC PANEL
Anion gap: 8 (ref 5–15)
BUN: 19 mg/dL (ref 8–23)
CO2: 24 mmol/L (ref 22–32)
Calcium: 8.2 mg/dL — ABNORMAL LOW (ref 8.9–10.3)
Chloride: 105 mmol/L (ref 98–111)
Creatinine, Ser: 1.64 mg/dL — ABNORMAL HIGH (ref 0.61–1.24)
GFR, Estimated: 43 mL/min — ABNORMAL LOW (ref >60)
Glucose, Bld: 130 mg/dL — ABNORMAL HIGH (ref 70–99)
Potassium: 4.1 mmol/L (ref 3.5–5.1)
Sodium: 137 mmol/L (ref 135–145)

## 2021-06-24 LAB — CBC
HCT: 28.4 % — ABNORMAL LOW (ref 39.0–52.0)
Hemoglobin: 9 g/dL — ABNORMAL LOW (ref 13.0–17.0)
MCH: 29 pg (ref 26.0–34.0)
MCHC: 31.7 g/dL (ref 30.0–36.0)
MCV: 91.6 fL (ref 80.0–100.0)
Platelets: 139 10*3/uL — ABNORMAL LOW (ref 150–400)
RBC: 3.1 MIL/uL — ABNORMAL LOW (ref 4.22–5.81)
RDW: 14.2 % (ref 11.5–15.5)
WBC: 7.7 10*3/uL (ref 4.0–10.5)
nRBC: 0 % (ref 0.0–0.2)

## 2021-06-24 LAB — CULTURE, RESPIRATORY W GRAM STAIN
Culture: NORMAL
Gram Stain: NONE SEEN

## 2021-06-24 NOTE — Progress Notes (Signed)
?PROGRESS NOTE ? ? ? ?Alan Stafford  ZOX:[id number] DOB: December 21, 1945 DOA: 06/21/2021 ?PCP: Lindwood QuaHoffman, Byron, MD  ?Outpatient Specialists: cardiology ? ? ? ?Brief Narrative:  ? ?Alan Stafford is a 76 y.o. 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 ?They state that patient was in his usual state of health and was scheduled for colonoscopy on the day of presentation.  He has been off his Coumadin for about 4 days for the planned procedure and had started taking his prep for the colonoscopy the day prior to his admission.  His wife states that later that night he became very lethargic, sweaty and confused.  She also states that he had shaking chills.  She checked a home COVID-19 test which was negative.  She called EMS due to his worsening symptoms. ?Per EMS he had a temp of 100.8 and his blood sugar was 191. ?During my evaluation he is very lethargic and only arouses to deep sternal rub. ? ? ? ?Assessment & Plan: ?  ?Active Problems: ?  Chronic pain syndrome ?  Sepsis (HCC) ?  CAP (community acquired pneumonia) ?  Acute metabolic encephalopathy ?  NSTEMI (non-ST elevated myocardial infarction) (HCC) ?  Hyperkalemia ?  CKD stage 3 due to type 2 diabetes mellitus (HCC) ?  Paroxysmal atrial fibrillation (HCC) ?  Chronic systolic heart failure (HCC) ?  Essential hypertension ?  Atrial fibrillation (HCC) ?  Hyperthyroidism ?  Type 2 diabetes mellitus with vascular disease (HCC) ?  History of CVA (cerebrovascular accident) ? ?# Acute metabolic encephalopathy ?resolved. Precise etiology unclear. NSTEMI, pneumonia, electrolyte derangement from colonic prep, dehydration from colonic prep, on chronic medical conditions including history CVA. ? - monitor ? ?# CAP,  question aspiration ?# Sepsis ?Sepsis by fever, leukocytosis. No clear inciting aspiration event. Was in process of prep at time of mental status changes. CTA no PE but lower lobe process and signs ILD on CT suggestive of multifocal pneumonia or aspiration. Not requiring O2. Passed bedside swallow eval. Having blood tinged sputum that is infrequent. Sputum ngtd, same for blood cultures ?- cont unasyn; abx started 4/27 ?- will discuss hemoptysis w/ pulm ?- f/u blood cultures; sputum for culture, legionella urine antigen ? ?# Dark stool ?# Anemia ?Patient says dark brown, not black, nursing confirms this. No hx gib but was treated w/ 48 hours heparin (now off) and says plan for colonoscopy was because colon cancer stool screening positive for blood. Hgb has dropped from 12.4 on admission to low 9s, now stable. Is on warfarin chronically. Gi pathogen panel neg. No fever, abd pain, or leukocytosis to suggest c diff. Possible unasyn side effect ?- monitor ?- cont ppi IV bid ? ?# NSTEMI ?Asymptomatic. Cardiology following. Thought to be demand. Troponin plateau 6800. Hemodynamically stable. 3 vessel disease seen on CT. Received asa. Mod midral regurg on tte, otherwise unremarkable ?- s/p 48 hours IV heparin ?- cont aspirin, statin, zetia ?[facility] ? ?# Hyperkalemia ?Possibly 2/2 colonic prep. Resolved ?- monitor ? ?# HFpEF ?# Moderate mitral regurg ?No other abnormalities on tte this hospitalization ?- cont home metoprolol ? ?# CKD 3b ?Kidney function is at baseline ? ?# A fib ?Rate controlled ?- cont home metoprolol ?- cardiology to resume home flecainide when they think appropriate ?-  home warfarin on hold ? ?# History CVA ?No acute stroke on MRI ?- home meds ? ?# Hyperthyroid ?No signs agranulocytosis on cbc ?- cont home methimazole ? ?# Chronic pain ?- resume home lyrica, cont home hydrocodone ? ?# HTN ?Home enalapril on hold 2/2 soft BPs ? ? ?DVT prophylaxis: SCDs ?Code Status: full ?Family  Communication: wife and daughter updated @ bedside 4/30 ? ?Level of care: Telemetry Medical ?Status is: Inpatient ?Remains inpatient appropriate because: severity of illness ? ? ? ?Consultants:  ?cardiology ? ?Procedures: ?none ? ?Antimicrobials:  ?Azithromycin/ceftriaxone 4/27-4/28 ?Unasyn 4/28>  ? ? ?Subjective: ?No chest pain. Cough productive, blood tinged and dark. Dark loose stool continues. ? ?Objective: ?Vitals:  ? 06/23/21 2337 06/23/21 2338 06/24/21 0416 06/24/21 0846  ?BP:  119/67 138/68 (!) 144/95  ?Pulse:  84 76 81  ?Resp: 17  18 18   ?Temp: 99.5 ?F (37.5 ?C)  98.8 ?F (37.1 ?C) 98.1 ?F (36.7 ?C)  ?TempSrc: Oral  Oral   ?SpO2:  94% 95% 94%  ?Weight:      ?Height:      ? ? ?Intake/Output Summary (Last 24 hours) at 06/24/2021 1229 ?Last data filed at 06/24/2021 1029 ?Gross per 24 hour  ?Intake 446.23 ml  ?Output 800 ml  ?Net -353.77 ml  ? ?Filed Weights  ? 06/21/21 0902 06/21/21 2110  ?Weight: 94.8 kg 90.7 kg  ? ? ?Examination: ? ?General exam: Appears calm and comfortable  ?Respiratory system: rales right base ?Cardiovascular system: S1 & S2 heard, RRR. No JVD, soft systolic murmur, no rubs, gallops or clicks. No pedal edema. ?Gastrointestinal system: Abdomen is nondistended, soft and nontender. No organomegaly or masses felt. Normal bowel sounds heard. ?Central nervous system: Alert and oriented. No focal neurological deficits. ?Extremities: Symmetric 5 x 5 power. ?Skin: No rashes, lesions or ulcers ?Psychiatry: Judgement and insight appear normal. Mood & affect appropriate.  ? ? ? ?Data Reviewed: I have personally reviewed following labs and imaging studies ? ?CBC: ?Recent Labs  ?Lab 06/21/21 ?0905 06/22/21 ?0324 06/23/21 ?0451 06/23/21 ?1509 06/24/21 ?[id number]  ?WBC 18.2* 11.8* 8.1  --  7.7  ?NEUTROABS 16.0*  --   --   --   --   ?HGB 12.4* 10.8* 9.3* 9.1* 9.0*  ?HCT 38.8* 33.6* 29.2*  --  28.4*  ?MCV 90.9 90.6 91.0  --  91.6  ?PLT 172 131* 133*  --  139*  ? ?Basic Metabolic Panel: ?Recent Labs  ?Lab  06/21/21 ?0905 06/22/21 ?0324 06/23/21 ?0451 06/24/21 ?[id number]  ?NA 134* 140 136 137  ?K 5.7* 4.5 4.2 4.1  ?CL 104 108 105 105  ?CO2 24 26 24 24   ?GLUCOSE 181* 127* 120* 130*  ?BUN 31* 23 22 19   ?CREATININE 1.85* 1.67* 1.68* 1.64*  ?CALCIUM 8.5* 8.3* 7.9* 8.2*  ?MG 2.0  --   --   --   ? ?GFR: ?Estimated Creatinine Clearance: 45.2 mL/min (A) (by C-G formula based on SCr of 1.64 mg/dL (H)). ?Liver Function Tests: ?Recent Labs  ?Lab 06/21/21 ?[id number]  ?AST 37  ?ALT 23  ?ALKPHOS 80  ?BILITOT 1.3*  ?PROT 7.3  ?ALBUMIN 3.6  ? ?No results for input(s): LIPASE, AMYLASE in the last 168 hours. ?No results for input(s): AMMONIA in the last 168 hours. ?Coagulation Profile: ?Recent Labs  ?Lab 06/21/21 ?[id number] 06/21/21 ?1446 06/22/21 ?0324  ?INR 1.3* 1.4* 1.6*  ? ?Cardiac Enzymes: ?No results for input(s): CKTOTAL, CKMB, CKMBINDEX, TROPONINI in the last 168 hours. ?BNP (last 3 results) ?No results for input(s):  PROBNP in the last 8760 hours. ?HbA1C: ?No results for input(s): HGBA1C in the last 72 hours. ?CBG: ?Recent Labs  ?Lab 06/21/21 ?0901 06/21/21 ?1259 06/21/21 ?2111  ?GLUCAP 167* 158* 139*  ? ?Lipid Profile: ?No results for input(s): CHOL, HDL, LDLCALC, TRIG, CHOLHDL, LDLDIRECT in the last 72 hours. ?Thyroid Function Tests: ?No results for input(s): TSH, T4TOTAL, FREET4, T3FREE, THYROIDAB in the last 72 hours. ?Anemia Panel: ?No results for input(s): VITAMINB12, FOLATE, FERRITIN, TIBC, IRON, RETICCTPCT in the last 72 hours. ?Urine analysis: ?   ?Component Value Date/Time  ? COLORURINE STRAW (A) 06/21/2021 1652  ? APPEARANCEUR CLEAR (A) 06/21/2021 1652  ? APPEARANCEUR Clear 12/07/2019 1531  ? LABSPEC 1.015 06/21/2021 1652  ? PHURINE 6.0 06/21/2021 1652  ? GLUCOSEU 50 (A) 06/21/2021 1652  ? HGBUR SMALL (A) 06/21/2021 1652  ? [place] NEGATIVE 06/21/2021 1652  ? BILIRUBINUR Negative 12/07/2019 1531  ? [place] NEGATIVE 06/21/2021 1652  ? [place] NEGATIVE 06/21/2021 1652  ? NITRITE NEGATIVE 06/21/2021 1652  ? LEUKOCYTESUR  NEGATIVE 06/21/2021 1652  ? ?Sepsis Labs: ?@[email or web address] (procalcitonin:4,lacticidven:4) ? ?) ?Recent Results (from the past 240 hour(s))  ?MRSA Next Gen by PCR, Nasal     Status: Abnormal  ? Collection Time: 06/21/21  8:32 AM  ? Specimen

## 2021-06-24 NOTE — Progress Notes (Signed)
[facility] Cardiology ? ?SUBJECTIVE: Patient laying in bed, continues to deny chest pain or shortness of breath ? ? ?Vitals:  ? 06/23/21 2337 06/23/21 2338 06/24/21 0416 06/24/21 0846  ?BP:  119/67 138/68 (!) 144/95  ?Pulse:  84 76 81  ?Resp: 17  18 18   ?Temp: 99.5 ?F (37.5 ?C)  98.8 ?F (37.1 ?C) 98.1 ?F (36.7 ?C)  ?TempSrc: Oral  Oral   ?SpO2:  94% 95% 94%  ?Weight:      ?Height:      ? ? ? ?Intake/Output Summary (Last 24 hours) at 06/24/2021 1004 ?Last data filed at 06/24/2021 0543 ?Gross per 24 hour  ?Intake 566.23 ml  ?Output 800 ml  ?Net -233.77 ml  ? ? ? ? ?PHYSICAL EXAM ? ?General: Well developed, well nourished, in no acute distress ?HEENT:  Normocephalic and atramatic ?Neck:  No JVD.  ?Lungs: Clear bilaterally to auscultation and percussion. ?Heart: HRRR . Normal S1 and S2 without gallops or murmurs.  ?Abdomen: Bowel sounds are positive, abdomen soft and non-tender  ?Msk:  Back normal, normal gait. Normal strength and tone for age. ?Extremities: No clubbing, cyanosis or edema.   ?Neuro: Alert and oriented X 3. ?Psych:  Good affect, responds appropriately ? ? ?LABS: ?Basic Metabolic Panel: ?Recent Labs  ?  06/23/21 ?0451 06/24/21 ?[id number]  ?NA 136 137  ?K 4.2 4.1  ?CL 105 105  ?CO2 24 24  ?GLUCOSE 120* 130*  ?BUN 22 19  ?CREATININE 1.68* 1.64*  ?CALCIUM 7.9* 8.2*  ? ?Liver Function Tests: ?No results for input(s): AST, ALT, ALKPHOS, BILITOT, PROT, ALBUMIN in the last 72 hours. ?No results for input(s): LIPASE, AMYLASE in the last 72 hours. ?CBC: ?Recent Labs  ?  06/23/21 ?0451 06/23/21 ?1509 06/24/21 ?[id number]  ?WBC 8.1  --  7.7  ?HGB 9.3* 9.1* 9.0*  ?HCT 29.2*  --  28.4*  ?MCV 91.0  --  91.6  ?PLT 133*  --  139*  ? ?Cardiac Enzymes: ?No results for input(s): CKTOTAL, CKMB, CKMBINDEX, TROPONINI in the last 72 hours. ?BNP: ?Invalid input(s): POCBNP ?D-Dimer: ?No results for input(s): DDIMER in the last 72 hours. ?Hemoglobin A1C: ?No results for input(s): HGBA1C in the last 72 hours. ?Fasting Lipid Panel: ?No results for  input(s): CHOL, HDL, LDLCALC, TRIG, CHOLHDL, LDLDIRECT in the last 72 hours. ?Thyroid Function Tests: ?No results for input(s): TSH, T4TOTAL, T3FREE, THYROIDAB in the last 72 hours. ? ?Invalid input(s): FREET3 ?Anemia Panel: ?No results for input(s): VITAMINB12, FOLATE, FERRITIN, TIBC, IRON, RETICCTPCT in the last 72 hours. ? ?ECHOCARDIOGRAM COMPLETE ? ?Result Date: 06/23/2021 ?   ECHOCARDIOGRAM REPORT   Patient Name:   Alan Stafford Date of Exam: 06/22/2021 Medical Rec #:  [id number]     Height:       74.0 in Accession #:    [id number]    Weight:       200.0 lb Date of Birth:  24-Nov-1945     BSA:          2.173 m? Patient Age:    76 years      BP:           119/62 mmHg Patient Gender: M             HR:           71 bpm. Exam Location:  [facility] Procedure: 2D Echo, Cardiac Doppler and Color Doppler Indications:     NSTEMI I21.4  History:         Patient has prior  history of Echocardiogram examinations, most                  recent 04/23/2016. Arrythmias:Atrial Fibrillation; Risk                  Factors:Diabetes and Hypertension.  Sonographer:     Sherrie Sport Referring Phys:  [id number] [place] TANG Diagnosing Phys: Isaias Cowman MD IMPRESSIONS  1. Left ventricular ejection fraction, by estimation, is 55 to 60%. The left ventricle has normal function. The left ventricle has no regional wall motion abnormalities. Left ventricular diastolic parameters were normal.  2. Right ventricular systolic function is normal. The right ventricular size is normal.  3. The mitral valve is normal in structure. Moderate mitral valve regurgitation. No evidence of mitral stenosis.  4. The aortic valve is normal in structure. Aortic valve regurgitation is mild. Mild aortic valve stenosis.  5. The inferior vena cava is normal in size with greater than 50% respiratory variability, suggesting right atrial pressure of 3 mmHg. FINDINGS  Left Ventricle: Left ventricular ejection fraction, by estimation, is 55 to 60%. The left ventricle has  normal function. The left ventricle has no regional wall motion abnormalities. The left ventricular internal cavity size was normal in size. There is  no left ventricular hypertrophy. Left ventricular diastolic parameters were normal. Right Ventricle: The right ventricular size is normal. No increase in right ventricular wall thickness. Right ventricular systolic function is normal. Left Atrium: Left atrial size was normal in size. Right Atrium: Right atrial size was normal in size. Pericardium: There is no evidence of pericardial effusion. Mitral Valve: The mitral valve is normal in structure. Moderate mitral valve regurgitation. No evidence of mitral valve stenosis. MV peak gradient, 5.6 mmHg. The mean mitral valve gradient is 3.0 mmHg. Tricuspid Valve: The tricuspid valve is normal in structure. Tricuspid valve regurgitation is mild . No evidence of tricuspid stenosis. Aortic Valve: The aortic valve is normal in structure. Aortic valve regurgitation is mild. Mild aortic stenosis is present. Aortic valve mean gradient measures 7.7 mmHg. Aortic valve peak gradient measures 12.4 mmHg. Aortic valve area, by VTI measures 2.04 cm?. Pulmonic Valve: The pulmonic valve was normal in structure. Pulmonic valve regurgitation is not visualized. No evidence of pulmonic stenosis. Aorta: The aortic root is normal in size and structure. Venous: The inferior vena cava is normal in size with greater than 50% respiratory variability, suggesting right atrial pressure of 3 mmHg. IAS/Shunts: No atrial level shunt detected by color flow Doppler.  LEFT VENTRICLE PLAX 2D LVIDd:         4.60 cm      Diastology LVIDs:         2.90 cm      LV e' medial:    6.31 cm/s LV PW:         1.00 cm      LV E/e' medial:  17.4 LV IVS:        1.20 cm      LV e' lateral:   7.40 cm/s LVOT diam:     2.00 cm      LV E/e' lateral: 14.9 LV SV:         63 LV SV Index:   29 LVOT Area:     3.14 cm?  LV Volumes (MOD) LV vol d, MOD A2C: 102.0 ml LV vol d, MOD A4C:  161.0 ml LV vol s, MOD A2C: 63.5 ml LV vol s, MOD A4C: 91.7 ml LV SV MOD A2C:  38.5 ml LV SV MOD A4C:     161.0 ml LV SV MOD BP:      54.8 ml RIGHT VENTRICLE RV S prime:     14.50 cm/s TAPSE (M-mode): 3.6 cm LEFT ATRIUM              Index        RIGHT ATRIUM           Index LA diam:        5.00 cm  2.30 cm/m?   RA Area:     28.30 cm? LA Vol (A2C):   84.2 ml  38.74 ml/m?  RA Volume:   89.80 ml  41.32 ml/m? LA Vol (A4C):   100.0 ml 46.01 ml/m? LA Biplane Vol: 92.1 ml  42.38 ml/m?  AORTIC VALVE                     PULMONIC VALVE AV Area (Vmax):    1.80 cm?      PV Vmax:        0.96 m/s AV Area (Vmean):   1.67 cm?      PV Vmean:       70.800 cm/s AV Area (VTI):     2.04 cm?      PV VTI:         0.173 m AV Vmax:           176.33 cm/s   PV Peak grad:   3.7 mmHg AV Vmean:          124.233 cm/s  PV Mean grad:   2.0 mmHg AV VTI:            0.307 m       RVOT Peak grad: 3 mmHg AV Peak Grad:      12.4 mmHg AV Mean Grad:      7.7 mmHg LVOT Vmax:         101.00 cm/s LVOT Vmean:        66.200 cm/s LVOT VTI:          0.200 m LVOT/AV VTI ratio: 0.65  AORTA Ao Root diam: 3.50 cm MITRAL VALVE                TRICUSPID VALVE MV Area (PHT): 3.43 cm?     TR Peak grad:   32.7 mmHg MV Area VTI:   1.96 cm?     TR Vmax:        286.00 cm/s MV Peak grad:  5.6 mmHg MV Mean grad:  3.0 mmHg     SHUNTS MV Vmax:       1.18 m/s     Systemic VTI:  0.20 m MV Vmean:      83.7 cm/s    Systemic Diam: 2.00 cm MV Decel Time: 221 msec     Pulmonic VTI:  0.168 m MV E velocity: 110.00 cm/s MV A velocity: 98.50 cm/s MV E/A ratio:  1.12 Isaias Cowman MD Electronically signed by Isaias Cowman MD Signature Date/Time: 06/23/2021/1:23:11 PM    Final    ? ? ?Echo of EF 55-60% 06/22/2021 ? ?TELEMETRY: Sinus tachycardia 103 bpm: ? ?ASSESSMENT AND PLAN: ? ?Active Problems: ?  Atrial fibrillation ([place]) ?  Essential hypertension ?  Hyperthyroidism ?  Paroxysmal atrial fibrillation (HCC) ?  Type 2 diabetes mellitus with vascular disease ([place]) ?  CAP  (community acquired pneumonia) ?  Chronic systolic heart failure ([place]) ?  Chronic pain syndrome ?  Hyperkalemia ?  CKD  stage 3 due to type 2 diabetes mellitus ([place]) ?  Sepsis ([place]) ?  NSTEMI (non-ST elevated myocardia

## 2021-06-25 ENCOUNTER — Encounter: Payer: Self-pay | Admitting: Cardiology

## 2021-06-25 ENCOUNTER — Ambulatory Visit (HOSPITAL_BASED_OUTPATIENT_CLINIC_OR_DEPARTMENT_OTHER): Payer: Medicare Other | Admitting: Pain Medicine

## 2021-06-25 ENCOUNTER — Encounter
Admission: EM | Disposition: A | Discharge status: Other Acute Inpatient Hospital | Payer: Self-pay | Source: Home / Self Care | Attending: Obstetrics and Gynecology

## 2021-06-25 DIAGNOSIS — I214 Non-ST elevation (NSTEMI) myocardial infarction: Secondary | ICD-10-CM | POA: Diagnosis not present

## 2021-06-25 DIAGNOSIS — Z91199 Patient's noncompliance with other medical treatment and regimen due to unspecified reason: Secondary | ICD-10-CM

## 2021-06-25 HISTORY — PX: LEFT HEART CATH AND CORONARY ANGIOGRAPHY: CATH118249

## 2021-06-25 LAB — BASIC METABOLIC PANEL
Anion gap: 7 (ref 5–15)
BUN: 14 mg/dL (ref 8–23)
CO2: 26 mmol/L (ref 22–32)
Calcium: 8.4 mg/dL — ABNORMAL LOW (ref 8.9–10.3)
Chloride: 104 mmol/L (ref 98–111)
Creatinine, Ser: 1.59 mg/dL — ABNORMAL HIGH (ref 0.61–1.24)
GFR, Estimated: 45 mL/min — ABNORMAL LOW (ref >60)
Glucose, Bld: 120 mg/dL — ABNORMAL HIGH (ref 70–99)
Potassium: 4.1 mmol/L (ref 3.5–5.1)
Sodium: 137 mmol/L (ref 135–145)

## 2021-06-25 LAB — CBC
HCT: 28.5 % — ABNORMAL LOW (ref 39.0–52.0)
Hemoglobin: 9.2 g/dL — ABNORMAL LOW (ref 13.0–17.0)
MCH: 29.4 pg (ref 26.0–34.0)
MCHC: 32.3 g/dL (ref 30.0–36.0)
MCV: 91.1 fL (ref 80.0–100.0)
Platelets: 142 K/uL — ABNORMAL LOW (ref 150–400)
RBC: 3.13 MIL/uL — ABNORMAL LOW (ref 4.22–5.81)
RDW: 14.3 % (ref 11.5–15.5)
WBC: 7.2 K/uL (ref 4.0–10.5)
nRBC: 0 % (ref 0.0–0.2)

## 2021-06-25 LAB — LEGIONELLA PNEUMOPHILA SEROGP 1 UR AG: L. pneumophila Serogp 1 Ur Ag: NEGATIVE

## 2021-06-25 LAB — SURGICAL PCR SCREEN
MRSA, PCR: NEGATIVE
Staphylococcus aureus: NEGATIVE

## 2021-06-25 LAB — GLUCOSE, CAPILLARY
Glucose-Capillary: 113 mg/dL — ABNORMAL HIGH (ref 70–99)
Glucose-Capillary: 99 mg/dL (ref 70–99)
Glucose-Capillary: 114 mg/dL — ABNORMAL HIGH (ref 70–99)
Glucose-Capillary: 106 mg/dL — ABNORMAL HIGH (ref 70–99)

## 2021-06-25 SURGERY — LEFT HEART CATH AND CORONARY ANGIOGRAPHY
Anesthesia: Choice

## 2021-06-25 MED ORDER — SODIUM CHLORIDE 0.9 % IV SOLN: 250.0000 mL | INTRAVENOUS | Status: DC | PRN

## 2021-06-25 MED ORDER — SODIUM CHLORIDE 0.9 % WEIGHT BASED INFUSION: 1.0000 mL/kg/h | INTRAVENOUS | Status: AC

## 2021-06-25 MED ORDER — ASPIRIN 81 MG PO TBEC: 81.0000 mg | DELAYED_RELEASE_TABLET | Freq: Every day | ORAL | 11 refills | Status: AC

## 2021-06-25 MED ORDER — IOHEXOL 300 MG/ML  SOLN
INTRAMUSCULAR | Status: DC | PRN
Start: 2021-06-25 — End: 2021-06-25
  Administered 2021-06-25: 175 mL

## 2021-06-25 MED ORDER — FENTANYL CITRATE (PF) 100 MCG/2ML IJ SOLN
INTRAMUSCULAR | Status: AC
  Filled 2021-06-25: qty 2

## 2021-06-25 MED ORDER — HEPARIN SODIUM (PORCINE) 1000 UNIT/ML IJ SOLN
INTRAMUSCULAR | Status: DC | PRN
  Administered 2021-06-25: 4500 [IU] via INTRAVENOUS

## 2021-06-25 MED ORDER — SODIUM CHLORIDE 0.9% FLUSH: 3.0000 mL | Freq: Two times a day (BID) | INTRAVENOUS | Status: DC

## 2021-06-25 MED ORDER — PREGABALIN 25 MG PO CAPS: 25.0000 mg | ORAL_CAPSULE | Freq: Three times a day (TID) | ORAL | Status: AC

## 2021-06-25 MED ORDER — VERAPAMIL HCL 2.5 MG/ML IV SOLN
INTRAVENOUS | Status: DC | PRN
Start: 2021-06-25 — End: 2021-06-25
  Administered 2021-06-25: 2.5 mg via INTRA_ARTERIAL

## 2021-06-25 MED ORDER — METOPROLOL SUCCINATE ER 25 MG PO TB24
25.0000 mg | ORAL_TABLET | Freq: Every day | ORAL | Status: AC
Start: 2021-06-25 — End: ?

## 2021-06-25 MED ORDER — METHIMAZOLE 10 MG PO TABS: 10.0000 mg | ORAL_TABLET | Freq: Two times a day (BID) | ORAL | Status: AC

## 2021-06-25 MED ORDER — ACETAMINOPHEN 325 MG PO TABS: 650.0000 mg | ORAL_TABLET | ORAL | Status: DC | PRN

## 2021-06-25 MED ORDER — LIDOCAINE HCL (PF) 1 % IJ SOLN
INTRAMUSCULAR | Status: DC | PRN
  Administered 2021-06-25: 2 mL

## 2021-06-25 MED ORDER — ONDANSETRON HCL 4 MG/2ML IJ SOLN: 4.0000 mg | Freq: Four times a day (QID) | INTRAMUSCULAR | Status: DC | PRN

## 2021-06-25 MED ORDER — AMPICILLIN-SULBACTAM SODIUM 3 (2-1) G IJ SOLR
3.0000 g | Freq: Four times a day (QID) | INTRAMUSCULAR | Status: AC
Start: 2021-06-25 — End: ?

## 2021-06-25 MED ORDER — HEPARIN (PORCINE) IN NACL 2000-0.9 UNIT/L-% IV SOLN
INTRAVENOUS | Status: DC | PRN
  Administered 2021-06-25: 1000 mL

## 2021-06-25 MED ORDER — SODIUM CHLORIDE 0.9 % WEIGHT BASED INFUSION
3.0000 mL/kg/h | INTRAVENOUS | Status: AC
  Administered 2021-06-25: 3 mL/kg/h via INTRAVENOUS

## 2021-06-25 MED ORDER — HEPARIN (PORCINE) IN NACL 1000-0.9 UT/500ML-% IV SOLN
INTRAVENOUS | Status: AC
  Filled 2021-06-25: qty 1000

## 2021-06-25 MED ORDER — HEPARIN SODIUM (PORCINE) 1000 UNIT/ML IJ SOLN
INTRAMUSCULAR | Status: AC
Start: 2021-06-25 — End: ?
  Filled 2021-06-25: qty 10

## 2021-06-25 MED ORDER — ASPIRIN 81 MG PO CHEW: 81.0000 mg | CHEWABLE_TABLET | ORAL | Status: DC

## 2021-06-25 MED ORDER — LABETALOL HCL 5 MG/ML IV SOLN: 10.0000 mg | INTRAVENOUS | Status: AC | PRN

## 2021-06-25 MED ORDER — VERAPAMIL HCL 2.5 MG/ML IV SOLN
INTRAVENOUS | Status: AC
  Filled 2021-06-25: qty 2

## 2021-06-25 MED ORDER — FENTANYL CITRATE (PF) 100 MCG/2ML IJ SOLN
INTRAMUSCULAR | Status: DC | PRN
  Administered 2021-06-25: 25 ug via INTRAVENOUS

## 2021-06-25 MED ORDER — LIDOCAINE HCL 1 % IJ SOLN
INTRAMUSCULAR | Status: AC
  Filled 2021-06-25: qty 20

## 2021-06-25 MED ORDER — MOMETASONE FURO-FORMOTEROL FUM 200-5 MCG/ACT IN AERO: 2.0000 | INHALATION_SPRAY | Freq: Two times a day (BID) | RESPIRATORY_TRACT | Status: AC

## 2021-06-25 MED ORDER — PANTOPRAZOLE SODIUM 40 MG IV SOLR: 40.0000 mg | Freq: Two times a day (BID) | INTRAVENOUS | Status: AC

## 2021-06-25 MED ORDER — SODIUM CHLORIDE 0.9 % WEIGHT BASED INFUSION
1.0000 mL/kg/h | INTRAVENOUS | Status: DC
  Administered 2021-06-25: 1 mL/kg/h via INTRAVENOUS

## 2021-06-25 MED ORDER — SODIUM CHLORIDE 0.9% FLUSH: 3.0000 mL | INTRAVENOUS | Status: DC | PRN

## 2021-06-25 MED ORDER — HYDRALAZINE HCL 20 MG/ML IJ SOLN: 10.0000 mg | INTRAMUSCULAR | Status: AC | PRN

## 2021-06-25 MED ORDER — SODIUM CHLORIDE 0.9% FLUSH
3.0000 mL | INTRAVENOUS | Status: DC | PRN
Start: 2021-06-25 — End: 2021-06-26

## 2021-06-25 MED ORDER — MIDAZOLAM HCL 2 MG/2ML IJ SOLN
INTRAMUSCULAR | Status: DC | PRN
  Administered 2021-06-25: 1 mg via INTRAVENOUS

## 2021-06-25 MED ORDER — MIDAZOLAM HCL 2 MG/2ML IJ SOLN
INTRAMUSCULAR | Status: AC
  Filled 2021-06-25: qty 2

## 2021-06-25 SURGICAL SUPPLY — 20 items
CATH 5F 110X4 TIG (CATHETERS) ×1 IMPLANT
CATH INFINITI JR4 5F (CATHETERS) ×1 IMPLANT
CATH VISTA GUIDE 6FR XB3.5 (CATHETERS) ×1 IMPLANT
DEVICE RAD TR BAND REGULAR (VASCULAR PRODUCTS) ×1 IMPLANT
DRAPE BRACHIAL (DRAPES) ×1 IMPLANT
GLIDESHEATH SLEND SS 6F .021 (SHEATH) ×1 IMPLANT
GUIDEWIRE INQWIRE 1.5J.035X260 (WIRE) IMPLANT
INQWIRE 1.5J .035X260CM (WIRE) ×2
KIT ENCORE 26 ADVANTAGE (KITS) ×1 IMPLANT
KIT SYRINGE INJ CVI SPIKEX1 (MISCELLANEOUS) ×1 IMPLANT
NDL PERC 18GX7CM (NEEDLE) IMPLANT
NEEDLE PERC 18GX7CM (NEEDLE) IMPLANT
PACK CARDIAC CATH (CUSTOM PROCEDURE TRAY) ×2 IMPLANT
PROTECTION STATION PRESSURIZED (MISCELLANEOUS) ×2
SET ATX SIMPLICITY (MISCELLANEOUS) ×1 IMPLANT
SHEATH AVANTI 5FR X 11CM (SHEATH) IMPLANT
STATION PROTECTION PRESSURIZED (MISCELLANEOUS) IMPLANT
TUBING CIL FLEX 10 FLL-RA (TUBING) ×1 IMPLANT
WIRE GUIDERIGHT .035X150 (WIRE) IMPLANT
WIRE HITORQ VERSACORE ST 145CM (WIRE) ×1 IMPLANT

## 2021-06-25 NOTE — Progress Notes (Signed)
Norco administered at 4:20 am. See MAR ?

## 2021-06-25 NOTE — Progress Notes (Addendum)
?PROGRESS NOTE ? ? ? ?Alan Stafford  [id number] DOB: 1945-07-25 DOA: 06/21/2021 ?PCP: Raelene Bott, MD  ?Outpatient Specialists: cardiology ? ? ? ?Brief Narrative:  ? ?Alan Stafford is a 76 y.o. 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 ?They state that patient was in his usual state of health and was scheduled for colonoscopy on the day of presentation.  He has been off his Coumadin for about 4 days for the planned procedure and had started taking his prep for the colonoscopy the day prior to his admission.  His wife states that later that night he became very lethargic, sweaty and confused.  She also states that he had shaking chills.  She checked a home COVID-19 test which was negative.  She called EMS due to his worsening symptoms. ?Per EMS he had a temp of 100.8 and his blood sugar was 191. ?During my evaluation he is very lethargic and only arouses to deep sternal rub. ? ? ? ?Assessment & Plan: ?  ?Active Problems: ?  Chronic pain syndrome ?  Sepsis ([place]) ?  CAP (community acquired pneumonia) ?  Acute metabolic encephalopathy ?  NSTEMI (non-ST elevated myocardial infarction) ([place]) ?  Hyperkalemia ?  CKD stage 3 due to type 2 diabetes mellitus ([place]) ?  Paroxysmal atrial fibrillation (H[facility]) ?  Chronic systolic heart failure ([place]) ?  Essential hypertension ?  Atrial fibrillation ([place]) ?  Hyperthyroidism ?  Type 2 diabetes mellitus with vascular disease ([place]) ?  History of CVA (cerebrovascular accident) ? ?# NSTEMI ?Asymptomatic. Cardiology following. Thought to be demand. Troponin plateau 6800. Hemodynamically stable. 3 vessel disease seen on CT. Received asa. Mod midral regurg on tte, otherwise unremarkable. LHC today with severe 2 vessel  disease, needs cabg. Stable post-cath. ?- s/p 48 hours IV heparin ?- cont aspirin, zetia (intolerant to statins) ?- transfer to [facility] ? ?# Acute metabolic encephalopathy ?resolved. Precise etiology unclear. NSTEMI, pneumonia, electrolyte derangement from colonic prep, dehydration from colonic prep, on chronic medical conditions including history CVA. ? - monitor ? ?# CAP, question aspiration ?# Sepsis ?Sepsis by fever, leukocytosis. No clear inciting aspiration event. Was in process of prep at time of mental status changes. CTA no PE but lower lobe process and signs ILD on CT suggestive of multifocal pneumonia or aspiration. Not requiring O2. Passed bedside swallow eval. Having blood tinged sputum that is infrequent. Sputum ngtd, same for blood cultures. Strep/legionella antigens neg ?- cont unasyn; abx started 4/27 ?- pulm advises further w/u inpt if worsens. As is stable will montior ? ?# Dark stool ?# Anemia ?Patient says dark brown, not black, nursing confirms this. No hx gib but was treated w/ 48 hours heparin (now off) and says plan for colonoscopy was because colon cancer stool screening positive for blood. Hgb has dropped from 12.4 on admission to low 9s, now stable. Is on warfarin chronically. Gi pathogen panel neg. No fever, abd pain, or leukocytosis to suggest c diff. Possible unasyn side effect ?- monitor ?- cont ppi IV bid ? ?# Hyperkalemia ?Possibly 2/2 colonic prep. Resolved ?- monitor ? ?# HFpEF ?# Moderate mitral regurg ?No other abnormalities on tte this hospitalization ?- cont home metoprolol ? ?# CKD 3b ?Kidney function is at baseline ? ?# A fib ?Rate controlled ?- cont home metoprolol ?-  cardiology to resume home flecainide when they think appropriate ?- home warfarin on hold ? ?# History CVA ?No acute stroke on MRI ?- home meds ? ?# Hyperthyroid ?No signs agranulocytosis on cbc ?- cont home methimazole ? ?# Chronic pain ?- resume home lyrica, cont home hydrocodone ? ?# HTN ?Home enalapril on  hold 2/2 soft BPs ? ? ?DVT prophylaxis: SCDs ?Code Status: full ?Family Communication: daughter updated @ bedside 5/1 ? ?Level of care: Telemetry Medical ?Status is: Inpatient ?Remains inpatient appropriate because: severity of illness ? ? ? ?Consultants:  ?cardiology ? ?Procedures: ?none ? ?Antimicrobials:  ?Azithromycin/ceftriaxone 4/27-4/28 ?Unasyn 4/28>  ? ? ?Subjective: ?Tolerate cath, no complaints. ? ?Objective: ?Vitals:  ? 06/25/21 1015 06/25/21 1030 06/25/21 1100 06/25/21 1142  ?BP: 135/73 127/73 140/61 133/61  ?Pulse: 79 80 79 67  ?Resp: 15 17 (!) 30 18  ?Temp:    98.2 ?F (36.8 ?C)  ?TempSrc:    Axillary  ?SpO2: 95% 95% 94% 95%  ?Weight:      ?Height:      ? ? ?Intake/Output Summary (Last 24 hours) at 06/25/2021 1313 ?Last data filed at 06/25/2021 1100 ?Gross per 24 hour  ?Intake 2102.21 ml  ?Output 3150 ml  ?Net -1047.79 ml  ? ?Filed Weights  ? 06/21/21 0902 06/21/21 2110 06/25/21 0220  ?Weight: 94.8 kg 90.7 kg 91.7 kg  ? ? ?Examination: ? ?General exam: Appears calm and comfortable.  ?Respiratory system: rales right base ?Cardiovascular system: S1 & S2 heard, RRR. No JVD, soft systolic murmur, no rubs, gallops or clicks. No pedal edema. ?Gastrointestinal system: Abdomen is nondistended, soft and nontender. No organomegaly or masses felt. Normal bowel sounds heard. ?Central nervous system: Alert and oriented. No focal neurological deficits. ?Extremities: Symmetric 5 x 5 power. ?Skin: No rashes, lesions or ulcers ?Psychiatry: Judgement and insight appear normal. Mood & affect appropriate.  ? ? ? ?Data Reviewed: I have personally reviewed following labs and imaging studies ? ?CBC: ?Recent Labs  ?Lab 06/21/21 ?0905 06/22/21 ?0324 06/23/21 ?0451 06/23/21 ?1509 06/24/21 ?[id number] 06/25/21 ?[id number]  ?WBC 18.2* 11.8* 8.1  --  7.7 7.2  ?NEUTROABS 16.0*  --   --   --   --   --   ?HGB 12.4* 10.8* 9.3* 9.1* 9.0* 9.2*  ?HCT 38.8* 33.6* 29.2*  --  28.4* 28.5*  ?MCV 90.9 90.6 91.0  --  91.6 91.1  ?PLT 172 131* 133*  --  139* 142*   ? ?Basic Metabolic Panel: ?Recent Labs  ?Lab 06/21/21 ?0905 06/22/21 ?0324 06/23/21 ?0451 06/24/21 ?[id number]  ?NA 134* 140 136 137  ?K 5.7* 4.5 4.2 4.1  ?CL 104 108 105 105  ?CO2 24 26 24 24   ?GLUCOSE 181* 127* 120* 130*  ?BUN 31* 23 22 19   ?CREATININE 1.85* 1.67* 1.68* 1.64*  ?CALCIUM 8.5* 8.3* 7.9* 8.2*  ?MG 2.0  --   --   --   ? ?GFR: ?Estimated Creatinine Clearance: 45.2 mL/min (A) (by C-G formula based on SCr of 1.64 mg/dL (H)). ?Liver Function Tests: ?Recent Labs  ?Lab 06/21/21 ?[id number]  ?AST 37  ?ALT 23  ?ALKPHOS 80  ?BILITOT 1.3*  ?PROT 7.3  ?ALBUMIN 3.6  ? ?No results for input(s): LIPASE, AMYLASE in the last 168 hours. ?No results for input(s): AMMONIA in the last 168 hours. ?Coagulation Profile: ?Recent Labs  ?Lab 06/21/21 ?[id number] 06/21/21 ?1446 06/22/21 ?0324  ?INR 1.3* 1.4* 1.6*  ? ?Cardiac Enzymes: ?No results for input(s): CKTOTAL, CKMB, CKMBINDEX, TROPONINI in the last 168 hours. ?BNP (  last 3 results) ?No results for input(s): PROBNP in the last 8760 hours. ?HbA1C: ?No results for input(s): HGBA1C in the last 72 hours. ?CBG: ?Recent Labs  ?Lab 06/21/21 ?1259 06/21/21 ?2111 06/25/21 ?0714 06/25/21 ?1106 06/25/21 ?1140  ?GLUCAP 158* 139* 113* 99 114*  ? ?Lipid Profile: ?No results for input(s): CHOL, HDL, LDLCALC, TRIG, CHOLHDL, LDLDIRECT in the last 72 hours. ?Thyroid Function Tests: ?No results for input(s): TSH, T4TOTAL, FREET4, T3FREE, THYROIDAB in the last 72 hours. ?Anemia Panel: ?No results for input(s): VITAMINB12, FOLATE, FERRITIN, TIBC, IRON, RETICCTPCT in the last 72 hours. ?Urine analysis: ?   ?Component Value Date/Time  ? COLORURINE STRAW (A) 06/21/2021 1652  ? APPEARANCEUR CLEAR (A) 06/21/2021 1652  ? APPEARANCEUR Clear 12/07/2019 1531  ? LABSPEC 1.015 06/21/2021 1652  ? PHURINE 6.0 06/21/2021 1652  ? GLUCOSEU 50 (A) 06/21/2021 1652  ? HGBUR SMALL (A) 06/21/2021 1652  ? [place] NEGATIVE 06/21/2021 1652  ? BILIRUBINUR Negative 12/07/2019 1531  ? [place] NEGATIVE 06/21/2021 1652  ? [place]  NEGATIVE 06/21/2021 1652  ? NITRITE NEGATIVE 06/21/2021 1652  ? LEUKOCYTESUR NEGATIVE 06/21/2021 1652  ? ?Sepsis Labs: ?@[email or web address] (procalcitonin:4,lacticidven:4) ? ?) ?Recent Results (from the past 240 hour

## 2021-06-25 NOTE — Progress Notes (Signed)
Right groin clipped ?

## 2021-06-25 NOTE — Progress Notes (Signed)
Nasal swabs collected and sent to Lab per PCR screen order ?

## 2021-06-25 NOTE — Progress Notes (Signed)
Right forearm IV leaking. Removed. IV consult placed for a second Peripheral IV ?

## 2021-06-25 NOTE — Progress Notes (Signed)
Patient scheduled for PCI procedure at 7:30 am. Informed consent present in chart ?

## 2021-06-25 NOTE — Progress Notes (Signed)
Mobility Specialist - Progress Note ? ? ? ? 06/25/21 1300  ?Mobility  ?Activity Refused mobility  ? ? ?Pt supine upon arrival using RA with family at bedside. Refused session no due to fatigue and upcoming discharge.  ? ?Alan Stafford ?Mobility Specialist ?06/25/21, 1:55 PM ? ? ? ?

## 2021-06-25 NOTE — Progress Notes (Signed)
CBC results collected on 04/30 in patient's chart. ?

## 2021-06-25 NOTE — Progress Notes (Signed)
[name] With [facility] just took patient off the unit to [facility] for a CABG ?

## 2021-06-25 NOTE — Progress Notes (Signed)
Patient has been taken off the unit with [facility]. Report given to [facility] Nurse Lucy Antigua, RN gave report to [facility] Nurse during previous shift. Patient left unit with daughter. All patient's belongings taken with them at discharge. Patient is alert and oriented. Complains of pain 9/10. 1 tab of Norco 10 mg administered. See flowsheets for pain assessment. Discharge documentation completed by Discharge nurse Darien Ramus, RN.  See Vital Signs below. ? ? 06/25/21 1952  ?Vitals  ?Temp 99.2 ?F (37.3 ?C)  ?Temp Source Oral  ?BP 139/84  ?MAP (mmHg) 102  ?BP Location Left Arm  ?BP Method Automatic  ?Patient Position (if appropriate) Lying  ?Pulse Rate (!) 108  ?Pulse Rate Source Monitor  ?Resp 19  ?MEWS COLOR  ?MEWS Score Color Green  ?Oxygen Therapy  ?SpO2 93 %  ?O2 Device Room Air  ?MEWS Score  ?MEWS Temp 0  ?MEWS Systolic 0  ?MEWS Pulse 1  ?MEWS RR 0  ?MEWS LOC 0  ?MEWS Score 1  ? ? ?

## 2021-06-25 NOTE — Progress Notes (Signed)
CHG bath completed  

## 2021-06-25 NOTE — Progress Notes (Signed)
Patient's weight is 202.2 lbs. ?

## 2021-06-25 NOTE — Progress Notes (Signed)
NS going at 90 ml/hr. ? ?Ampicillin administered ?

## 2021-06-25 NOTE — Progress Notes (Signed)
NS at 272 ml/hr ?

## 2021-06-25 NOTE — Progress Notes (Signed)
EKG performed on 04/28. See copy in patient's chart ?

## 2021-06-25 NOTE — Progress Notes (Signed)
Report given to Aurther Loft, Charity fundraiser. ? ?Aspirin pre-cath to be administered ?

## 2021-06-25 NOTE — Progress Notes (Signed)
Dr. Darrold Junker at bedside in specials recovery to speak with pt. And his family (wife and 2 daughters) re: cath results. Family verbalizes understanding of conversation and need to tx to [facility] for CABG.  ?

## 2021-06-25 NOTE — Discharge Summary (Addendum)
ALICIA ACKERT EQA:[id number] DOB: 17-Jun-1945 DOA: 06/21/2021 ? ?PCP: Raelene Bott, MD ? ?Admit date: 06/21/2021 ?Discharge date: 06/25/2021 ? ?Time spent: 40 minutes ? ? ? ? ?Discharge Diagnoses:  ?Active Problems: ?  Chronic pain syndrome ?  Sepsis ([place]) ?  CAP (community acquired pneumonia) ?  Acute metabolic encephalopathy ?  NSTEMI (non-ST elevated myocardial infarction) ([place]) ?  Hyperkalemia ?  CKD stage 3 due to type 2 diabetes mellitus ([place]) ?  Paroxysmal atrial fibrillation (HCC) ?  Chronic systolic heart failure ([place]) ?  Essential hypertension ?  Atrial fibrillation ([place]) ?  Hyperthyroidism ?  Type 2 diabetes mellitus with vascular disease ([place]) ?  History of CVA (cerebrovascular accident) ? ? ?Discharge Condition: stable ? ?Diet recommendation: heart healthy ? ?Filed Weights  ? 06/21/21 0902 06/21/21 2110 06/25/21 0220  ?Weight: 94.8 kg 90.7 kg 91.7 kg  ? ? ?History of present illness:  ?KHRIS JANSSON is a 76 y.o. 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 ?They state that patient was in his usual state of health and was scheduled for colonoscopy on the day of presentation.  He has been off his Coumadin for about 4 days for the planned procedure and had started taking his prep for the colonoscopy the day prior to his admission.  His wife states that later that night he became very lethargic, sweaty and confused.  She also states that he had shaking chills.  She checked a home COVID-19 test which was negative.  She called EMS due to his worsening symptoms. ?Per EMS he had a temp of 100.8 and his blood sugar was 191. ? ?Hospital Course:  ? ?# NSTEMI ?Asymptomatic. Cardiology following. Troponin plateau 6800. Hemodynamically stable. 3 vessel  disease seen on CT. Received asa. Mod midral regurg on tte, otherwise unremarkable. Received 48 hours IV heparin. LHC on 5/1 showed "Severe two-vessel coronary artery disease with complex 95% calcified stenosis proximal LAD involving takeoff of large D1, 60% stenosis left main, 75% stenosis ostial left circumflex" ?- transferring to [facility] for CABG ? ?# Acute metabolic encephalopathy ?resolved. Precise etiology unclear. NSTEMI, pneumonia, electrolyte derangement from colonic prep, dehydration from colonic prep, on chronic medical conditions including history CVA. ? - monitor ?  ?# CAP, question aspiration ?# Sepsis ?Sepsis by fever, leukocytosis. No clear inciting aspiration event. Was in process of colonic prep at time of mental status changes. CTA no PE but lower lobe process and signs ILD on CT suggestive of multifocal pneumonia or aspiration. Not requiring O2. Passed bedside swallow eval. Having blood tinged sputum that is infrequent. Sputum ngtd, same for blood cultures ?- cont unasyn, antibiotics started 4/27 ?  ?# Dark stool ?# Anemia ?Patient says dark brown, not black, nursing confirms this. No hx gib but was treated w/ 48 hours heparin (now off) and says plan for colonoscopy was because colon cancer stool screening positive for blood. Hgb has dropped from 12.4 on admission to low 9s, now stable. Is on warfarin chronically. Gi pathogen panel neg. No fever, abd pain, or leukocytosis to suggest c diff. Diarrhea possible unasyn side effect ?- monitor ?- cont ppi IV bid ?  ?# Hyperkalemia ?Possibly 2/2 colonic prep. Resolved ?- monitor ?  ?# HFpEF ?# Moderate mitral regurg ?No other abnormalities on tte this hospitalization ?- cont  home metoprolol ?  ?# CKD 3b ?Kidney function is at baseline ?  ?# A fib ?Rate controlled ?- cont home metoprolol ?- cardiology to resume home flecainide when they think appropriate ?- home warfarin on hold ?  ?# History CVA ?No acute stroke on MRI ?- home meds ?  ?# Hyperthyroid ?No  signs agranulocytosis on cbc ?- cont home methimazole ?  ?# Chronic pain ?- cont  home lyrica, cont home hydrocodone ?  ?# HTN ?Home enalapril on hold 2/2 soft BPs ?  ? ?Procedures: ?Left heart cath  ? ?Consultations: ?cardiology ? ?Discharge Exam: ?Vitals:  ? 06/25/21 0901 06/25/21 0914  ?BP: 132/82 (!) 143/91  ?Pulse: (!) 0 (!) 110  ?Resp:  18  ?Temp:    ?SpO2:  94%  ? ? ?General exam: Appears calm and comfortable  ?Respiratory system: rales right base ?Cardiovascular system: S1 & S2 heard, RRR. No JVD, soft systolic murmur, no rubs, gallops or clicks. No pedal edema. ?Gastrointestinal system: Abdomen is nondistended, soft and nontender. No organomegaly or masses felt. Normal bowel sounds heard. ?Central nervous system: Alert and oriented. No focal neurological deficits. ?Extremities: Symmetric 5 x 5 power. ?Skin: No rashes, lesions or ulcers ?Psychiatry: Judgement and insight appear normal. Mood & affect appropriate.  ? ?Discharge Instructions ? ? ?Discharge Instructions   ? ? Diet - low sodium heart healthy   Complete by: As directed ?  ? Increase activity slowly   Complete by: As directed ?  ? ?  ? ?Allergies as of 06/25/2021   ? ?   Reactions  ? Statins Other (See Comments)  ? Leg weakness  ? Sulfa Antibiotics Rash  ? ?  ? ?  ?Medication List  ?  ? ?STOP taking these medications   ? ?albuterol 108 (90 Base) MCG/ACT inhaler ?Commonly known as: VENTOLIN HFA ?  ?budesonide-formoterol 160-4.5 MCG/ACT inhaler ?Commonly known as: SYMBICORT ?Replaced by: mometasone-formoterol 200-5 MCG/ACT Aero ?  ?enalapril 10 MG tablet ?Commonly known as: VASOTEC ?  ?escitalopram 20 MG tablet ?Commonly known as: LEXAPRO ?  ?ezetimibe 10 MG tablet ?Commonly known as: ZETIA ?  ?Fifty50 Glucose Meter 2.0 w/Device Kit ?  ?flecainide 100 MG tablet ?Commonly known as: TAMBOCOR ?  ?glucose blood test strip ?  ?HYDROcodone-acetaminophen 10-325 MG tablet ?Commonly known as: NORCO ?  ?nitroGLYCERIN 0.4 MG SL tablet ?Commonly known as:  NITROSTAT ?  ?omeprazole 20 MG capsule ?Commonly known as: PRILOSEC ?  ?traZODone 50 MG tablet ?Commonly known as: DESYREL ?  ?warfarin 5 MG tablet ?Commonly known as: COUMADIN ?  ? ?  ? ?TAKE these medications   ? ?Ampicillin-Sulbactam 3 g in sodium chloride 0.9 % 100 mL ?Inject 3 g into the vein every 6 (six) hours. ?  ?aspirin 81 MG EC tablet ?Take 1 tablet (81 mg total) by mouth daily. Swallow whole. ?What changed: additional instructions ?  ?methimazole 10 MG tablet ?Commonly known as: TAPAZOLE ?Take 1 tablet (10 mg total) by mouth 2 (two) times daily. ?  ?metoprolol succinate 25 MG 24 hr tablet ?Commonly known as: TOPROL-XL ?Take 1 tablet (25 mg total) by mouth daily. ?  ?mometasone-formoterol 200-5 MCG/ACT Aero ?Commonly known as: DULERA ?Inhale 2 puffs into the lungs 2 (two) times daily. ?Replaces: budesonide-formoterol 160-4.5 MCG/ACT inhaler ?  ?pantoprazole 40 MG injection ?Commonly known as: PROTONIX ?Inject 40 mg into the vein every 12 (twelve) hours. ?  ?pregabalin 25 MG capsule ?Commonly known as: LYRICA ?Take 1 capsule (25 mg total) by mouth 3 (  three) times daily. ?What changed:  ?how much to take ?when to take this ?additional instructions ?  ? ?  ? ?Allergies  ?Allergen Reactions  ? Statins Other (See Comments)  ?  Leg weakness  ? Sulfa Antibiotics Rash  ? ? ? ? ?The results of significant diagnostics from this hospitalization (including imaging, microbiology, ancillary and laboratory) are listed below for reference.   ? ?Significant Diagnostic Studies: ?DG Chest 2 View ? ?Result Date: 06/21/2021 ?CLINICAL DATA:  Weakness EXAM: CHEST - 2 VIEW COMPARISON:  12/18/2016 FINDINGS: Moderate enlargement of the cardiopericardial silhouette with indistinct airspace opacities primarily at the lung bases and peripherally in the left mid lung, with associated obscuration of the hemidiaphragms. Faint Kerley B lines seen in the right upper lobe, bilateral Awanda Mink a lines are present. Potential blunting of the left  lateral costophrenic angle. Chronic wedging of upper thoracic vertebra, no change from 05/18/2016. Low lung volumes are present, causing crowding of the pulmonary vasculature. Old right posterolateral rib deformiti

## 2021-06-26 DIAGNOSIS — I5031 Acute diastolic (congestive) heart failure: Secondary | ICD-10-CM | POA: Insufficient documentation

## 2021-06-26 LAB — CULTURE, BLOOD (ROUTINE X 2)
Culture: NO GROWTH
Culture: NO GROWTH

## 2021-06-28 NOTE — Progress Notes (Signed)
This patient cancelled, rescheduled, or was a "NO SHOW" to this appointment. ?

## 2021-07-19 ENCOUNTER — Encounter: Payer: Self-pay | Admitting: Emergency Medicine

## 2021-07-19 ENCOUNTER — Emergency Department: Payer: Medicare Other

## 2021-07-19 ENCOUNTER — Other Ambulatory Visit: Payer: Self-pay

## 2021-07-19 ENCOUNTER — Inpatient Hospital Stay
Admission: EM | Admit: 2021-07-19 | Discharge: 2021-08-25 | DRG: 853 | Disposition: E | Payer: Medicare Other | Attending: Internal Medicine | Admitting: Internal Medicine

## 2021-07-19 DIAGNOSIS — D631 Anemia in chronic kidney disease: Secondary | ICD-10-CM | POA: Diagnosis present

## 2021-07-19 DIAGNOSIS — Z66 Do not resuscitate: Secondary | ICD-10-CM | POA: Diagnosis not present

## 2021-07-19 DIAGNOSIS — I480 Paroxysmal atrial fibrillation: Secondary | ICD-10-CM | POA: Diagnosis present

## 2021-07-19 DIAGNOSIS — R0902 Hypoxemia: Secondary | ICD-10-CM | POA: Diagnosis present

## 2021-07-19 DIAGNOSIS — N179 Acute kidney failure, unspecified: Secondary | ICD-10-CM | POA: Diagnosis not present

## 2021-07-19 DIAGNOSIS — L89151 Pressure ulcer of sacral region, stage 1: Secondary | ICD-10-CM | POA: Diagnosis present

## 2021-07-19 DIAGNOSIS — I5043 Acute on chronic combined systolic (congestive) and diastolic (congestive) heart failure: Secondary | ICD-10-CM | POA: Diagnosis present

## 2021-07-19 DIAGNOSIS — J8489 Other specified interstitial pulmonary diseases: Secondary | ICD-10-CM | POA: Diagnosis not present

## 2021-07-19 DIAGNOSIS — J849 Interstitial pulmonary disease, unspecified: Secondary | ICD-10-CM | POA: Diagnosis not present

## 2021-07-19 DIAGNOSIS — J9601 Acute respiratory failure with hypoxia: Secondary | ICD-10-CM | POA: Diagnosis present

## 2021-07-19 DIAGNOSIS — T402X5A Adverse effect of other opioids, initial encounter: Secondary | ICD-10-CM | POA: Diagnosis not present

## 2021-07-19 DIAGNOSIS — E781 Pure hyperglyceridemia: Secondary | ICD-10-CM | POA: Diagnosis present

## 2021-07-19 DIAGNOSIS — A419 Sepsis, unspecified organism: Principal | ICD-10-CM | POA: Diagnosis present

## 2021-07-19 DIAGNOSIS — I2510 Atherosclerotic heart disease of native coronary artery without angina pectoris: Secondary | ICD-10-CM | POA: Diagnosis present

## 2021-07-19 DIAGNOSIS — N170 Acute kidney failure with tubular necrosis: Secondary | ICD-10-CM | POA: Diagnosis present

## 2021-07-19 DIAGNOSIS — I130 Hypertensive heart and chronic kidney disease with heart failure and stage 1 through stage 4 chronic kidney disease, or unspecified chronic kidney disease: Secondary | ICD-10-CM | POA: Diagnosis present

## 2021-07-19 DIAGNOSIS — R0602 Shortness of breath: Secondary | ICD-10-CM | POA: Diagnosis present

## 2021-07-19 DIAGNOSIS — I214 Non-ST elevation (NSTEMI) myocardial infarction: Secondary | ICD-10-CM | POA: Diagnosis present

## 2021-07-19 DIAGNOSIS — Z820 Family history of epilepsy and other diseases of the nervous system: Secondary | ICD-10-CM

## 2021-07-19 DIAGNOSIS — Z8249 Family history of ischemic heart disease and other diseases of the circulatory system: Secondary | ICD-10-CM

## 2021-07-19 DIAGNOSIS — R6520 Severe sepsis without septic shock: Secondary | ICD-10-CM | POA: Diagnosis present

## 2021-07-19 DIAGNOSIS — E0590 Thyrotoxicosis, unspecified without thyrotoxic crisis or storm: Secondary | ICD-10-CM | POA: Diagnosis present

## 2021-07-19 DIAGNOSIS — Z7901 Long term (current) use of anticoagulants: Secondary | ICD-10-CM

## 2021-07-19 DIAGNOSIS — L8990 Pressure ulcer of unspecified site, unspecified stage: Secondary | ICD-10-CM | POA: Insufficient documentation

## 2021-07-19 DIAGNOSIS — R918 Other nonspecific abnormal finding of lung field: Secondary | ICD-10-CM | POA: Diagnosis not present

## 2021-07-19 DIAGNOSIS — Z951 Presence of aortocoronary bypass graft: Secondary | ICD-10-CM

## 2021-07-19 DIAGNOSIS — F1123 Opioid dependence with withdrawal: Secondary | ICD-10-CM | POA: Diagnosis not present

## 2021-07-19 DIAGNOSIS — L89896 Pressure-induced deep tissue damage of other site: Secondary | ICD-10-CM | POA: Diagnosis present

## 2021-07-19 DIAGNOSIS — Z79899 Other long term (current) drug therapy: Secondary | ICD-10-CM

## 2021-07-19 DIAGNOSIS — J189 Pneumonia, unspecified organism: Secondary | ICD-10-CM | POA: Diagnosis present

## 2021-07-19 DIAGNOSIS — J45909 Unspecified asthma, uncomplicated: Secondary | ICD-10-CM | POA: Diagnosis present

## 2021-07-19 DIAGNOSIS — K219 Gastro-esophageal reflux disease without esophagitis: Secondary | ICD-10-CM | POA: Diagnosis present

## 2021-07-19 DIAGNOSIS — Z96651 Presence of right artificial knee joint: Secondary | ICD-10-CM | POA: Diagnosis present

## 2021-07-19 DIAGNOSIS — Z515 Encounter for palliative care: Secondary | ICD-10-CM | POA: Diagnosis not present

## 2021-07-19 DIAGNOSIS — N1832 Chronic kidney disease, stage 3b: Secondary | ICD-10-CM | POA: Diagnosis present

## 2021-07-19 DIAGNOSIS — Z7951 Long term (current) use of inhaled steroids: Secondary | ICD-10-CM

## 2021-07-19 DIAGNOSIS — N183 Chronic kidney disease, stage 3 unspecified: Secondary | ICD-10-CM | POA: Diagnosis present

## 2021-07-19 DIAGNOSIS — Z20822 Contact with and (suspected) exposure to covid-19: Secondary | ICD-10-CM | POA: Diagnosis present

## 2021-07-19 DIAGNOSIS — I5023 Acute on chronic systolic (congestive) heart failure: Secondary | ICD-10-CM | POA: Diagnosis present

## 2021-07-19 DIAGNOSIS — Y95 Nosocomial condition: Secondary | ICD-10-CM | POA: Diagnosis present

## 2021-07-19 DIAGNOSIS — Z7982 Long term (current) use of aspirin: Secondary | ICD-10-CM

## 2021-07-19 DIAGNOSIS — E1159 Type 2 diabetes mellitus with other circulatory complications: Secondary | ICD-10-CM | POA: Diagnosis present

## 2021-07-19 DIAGNOSIS — Z79890 Hormone replacement therapy (postmenopausal): Secondary | ICD-10-CM

## 2021-07-19 DIAGNOSIS — Z882 Allergy status to sulfonamides status: Secondary | ICD-10-CM

## 2021-07-19 DIAGNOSIS — Z888 Allergy status to other drugs, medicaments and biological substances status: Secondary | ICD-10-CM

## 2021-07-19 DIAGNOSIS — M1990 Unspecified osteoarthritis, unspecified site: Secondary | ICD-10-CM | POA: Diagnosis present

## 2021-07-19 DIAGNOSIS — E1122 Type 2 diabetes mellitus with diabetic chronic kidney disease: Secondary | ICD-10-CM | POA: Diagnosis present

## 2021-07-19 DIAGNOSIS — Z7189 Other specified counseling: Secondary | ICD-10-CM | POA: Diagnosis not present

## 2021-07-19 LAB — URINALYSIS, COMPLETE (UACMP) WITH MICROSCOPIC
Bilirubin Urine: NEGATIVE
Glucose, UA: NEGATIVE mg/dL
Hgb urine dipstick: NEGATIVE
Ketones, ur: NEGATIVE mg/dL
Leukocytes,Ua: NEGATIVE
Nitrite: NEGATIVE
Protein, ur: NEGATIVE mg/dL
Specific Gravity, Urine: 1.013 (ref 1.005–1.030)
pH: 5 (ref 5.0–8.0)

## 2021-07-19 LAB — COMPREHENSIVE METABOLIC PANEL
ALT: 40 U/L (ref 0–44)
AST: 84 U/L — ABNORMAL HIGH (ref 15–41)
Albumin: 2.3 g/dL — ABNORMAL LOW (ref 3.5–5.0)
Alkaline Phosphatase: 101 U/L (ref 38–126)
Anion gap: 9 (ref 5–15)
BUN: 27 mg/dL — ABNORMAL HIGH (ref 8–23)
CO2: 22 mmol/L (ref 22–32)
Calcium: 8.3 mg/dL — ABNORMAL LOW (ref 8.9–10.3)
Chloride: 103 mmol/L (ref 98–111)
Creatinine, Ser: 1.71 mg/dL — ABNORMAL HIGH (ref 0.61–1.24)
GFR, Estimated: 41 mL/min — ABNORMAL LOW (ref >60)
Glucose, Bld: 144 mg/dL — ABNORMAL HIGH (ref 70–99)
Potassium: 4.3 mmol/L (ref 3.5–5.1)
Sodium: 134 mmol/L — ABNORMAL LOW (ref 135–145)
Total Bilirubin: 0.9 mg/dL (ref 0.3–1.2)
Total Protein: 7.5 g/dL (ref 6.5–8.1)

## 2021-07-19 LAB — CBC WITH DIFFERENTIAL/PLATELET
Abs Immature Granulocytes: 0.09 10*3/uL — ABNORMAL HIGH (ref 0.00–0.07)
Basophils Absolute: 0 10*3/uL (ref 0.0–0.1)
Basophils Relative: 0 %
Eosinophils Absolute: 0 10*3/uL (ref 0.0–0.5)
Eosinophils Relative: 0 %
HCT: 28 % — ABNORMAL LOW (ref 39.0–52.0)
Hemoglobin: 8.5 g/dL — ABNORMAL LOW (ref 13.0–17.0)
Immature Granulocytes: 1 %
Lymphocytes Relative: 8 %
Lymphs Abs: 1 10*3/uL (ref 0.7–4.0)
MCH: 26.6 pg (ref 26.0–34.0)
MCHC: 30.4 g/dL (ref 30.0–36.0)
MCV: 87.8 fL (ref 80.0–100.0)
Monocytes Absolute: 0.8 10*3/uL (ref 0.1–1.0)
Monocytes Relative: 6 %
Neutro Abs: 10.5 10*3/uL — ABNORMAL HIGH (ref 1.7–7.7)
Neutrophils Relative %: 85 %
Platelets: 430 10*3/uL — ABNORMAL HIGH (ref 150–400)
RBC: 3.19 MIL/uL — ABNORMAL LOW (ref 4.22–5.81)
RDW: 15.9 % — ABNORMAL HIGH (ref 11.5–15.5)
WBC: 12.3 10*3/uL — ABNORMAL HIGH (ref 4.0–10.5)
nRBC: 0 % (ref 0.0–0.2)

## 2021-07-19 LAB — PROTIME-INR
INR: 2.6 — ABNORMAL HIGH (ref 0.8–1.2)
Prothrombin Time: 27.5 seconds — ABNORMAL HIGH (ref 11.4–15.2)

## 2021-07-19 LAB — TROPONIN I (HIGH SENSITIVITY): Troponin I (High Sensitivity): 44 ng/L — ABNORMAL HIGH (ref <18)

## 2021-07-19 LAB — BRAIN NATRIURETIC PEPTIDE: B Natriuretic Peptide: 752.9 pg/mL — ABNORMAL HIGH (ref 0.0–100.0)

## 2021-07-19 LAB — LACTIC ACID, PLASMA: Lactic Acid, Venous: 1.2 mmol/L (ref 0.5–1.9)

## 2021-07-19 LAB — CBG MONITORING, ED
Glucose-Capillary: 54 mg/dL — ABNORMAL LOW (ref 70–99)
Glucose-Capillary: 77 mg/dL (ref 70–99)

## 2021-07-19 LAB — APTT: aPTT: 38 seconds — ABNORMAL HIGH (ref 24–36)

## 2021-07-19 LAB — SARS CORONAVIRUS 2 BY RT PCR: SARS Coronavirus 2 by RT PCR: NEGATIVE

## 2021-07-19 MED ORDER — DOCUSATE SODIUM 100 MG PO CAPS
100.0000 mg | ORAL_CAPSULE | Freq: Two times a day (BID) | ORAL | Status: DC
  Administered 2021-07-19: 100 mg via ORAL
  Filled 2021-07-19: qty 1

## 2021-07-19 MED ORDER — ALBUTEROL SULFATE (2.5 MG/3ML) 0.083% IN NEBU: 2.5000 mg | INHALATION_SOLUTION | RESPIRATORY_TRACT | Status: DC | PRN

## 2021-07-19 MED ORDER — WARFARIN SODIUM 1 MG PO TABS
1.0000 mg | ORAL_TABLET | Freq: Once | ORAL | Status: DC
Start: 2021-07-19 — End: 2021-07-20
  Filled 2021-07-19: qty 1

## 2021-07-19 MED ORDER — VANCOMYCIN HCL IN DEXTROSE 1-5 GM/200ML-% IV SOLN
1000.0000 mg | Freq: Once | INTRAVENOUS | Status: AC
  Administered 2021-07-19: 1000 mg via INTRAVENOUS
  Filled 2021-07-19: qty 200

## 2021-07-19 MED ORDER — ACETAMINOPHEN 650 MG RE SUPP: 650.0000 mg | Freq: Four times a day (QID) | RECTAL | Status: DC | PRN

## 2021-07-19 MED ORDER — MOMETASONE FURO-FORMOTEROL FUM 200-5 MCG/ACT IN AERO
2.0000 | INHALATION_SPRAY | Freq: Two times a day (BID) | RESPIRATORY_TRACT | Status: DC
  Filled 2021-07-19: qty 8.8

## 2021-07-19 MED ORDER — PANTOPRAZOLE SODIUM 40 MG IV SOLR
40.0000 mg | Freq: Two times a day (BID) | INTRAVENOUS | Status: DC
  Administered 2021-07-19: 40 mg via INTRAVENOUS
  Filled 2021-07-19: qty 10

## 2021-07-19 MED ORDER — HYDRALAZINE HCL 20 MG/ML IJ SOLN: 5.0000 mg | INTRAMUSCULAR | Status: DC | PRN

## 2021-07-19 MED ORDER — SODIUM CHLORIDE 0.9 % IV SOLN: INTRAVENOUS | Status: DC

## 2021-07-19 MED ORDER — HYDROCODONE-ACETAMINOPHEN 10-325 MG PO TABS: 1.0000 | ORAL_TABLET | Freq: Four times a day (QID) | ORAL | Status: DC | PRN

## 2021-07-19 MED ORDER — SODIUM CHLORIDE 0.9 % IV SOLN
2.0000 g | Freq: Once | INTRAVENOUS | Status: AC
  Administered 2021-07-19: 2 g via INTRAVENOUS
  Filled 2021-07-19: qty 12.5

## 2021-07-19 MED ORDER — WARFARIN - PHARMACIST DOSING INPATIENT
Freq: Every day | Status: DC
Start: 2021-07-20 — End: 2021-07-25
  Administered 2021-07-21: 1.5
  Administered 2021-07-22: 1
  Filled 2021-07-19: qty 1

## 2021-07-19 MED ORDER — ALBUTEROL SULFATE HFA 108 (90 BASE) MCG/ACT IN AERS: 1.0000 | INHALATION_SPRAY | Freq: Four times a day (QID) | RESPIRATORY_TRACT | Status: DC | PRN

## 2021-07-19 MED ORDER — INSULIN ASPART 100 UNIT/ML IJ SOLN: 0.0000 [IU] | Freq: Three times a day (TID) | INTRAMUSCULAR | Status: DC

## 2021-07-19 MED ORDER — HYDROCODONE-ACETAMINOPHEN 5-325 MG PO TABS
1.0000 | ORAL_TABLET | ORAL | Status: DC | PRN
  Administered 2021-07-19: 1 via ORAL
  Filled 2021-07-19: qty 1

## 2021-07-19 MED ORDER — ACETAMINOPHEN 325 MG PO TABS: 650.0000 mg | ORAL_TABLET | Freq: Four times a day (QID) | ORAL | Status: DC | PRN

## 2021-07-19 MED ORDER — ESCITALOPRAM OXALATE 20 MG PO TABS: 20.0000 mg | ORAL_TABLET | Freq: Every day | ORAL | Status: DC

## 2021-07-19 MED ORDER — SODIUM CHLORIDE 0.9 % IV SOLN
2.0000 g | Freq: Two times a day (BID) | INTRAVENOUS | Status: DC
  Administered 2021-07-20 – 2021-07-21 (×3): 2 g via INTRAVENOUS
  Filled 2021-07-19: qty 2
  Filled 2021-07-19: qty 12.5
  Filled 2021-07-19 (×2): qty 2

## 2021-07-19 MED ORDER — IPRATROPIUM-ALBUTEROL 0.5-2.5 (3) MG/3ML IN SOLN
3.0000 mL | Freq: Four times a day (QID) | RESPIRATORY_TRACT | Status: AC
Start: 2021-07-19 — End: 2021-07-20
  Administered 2021-07-19 – 2021-07-20 (×3): 3 mL via RESPIRATORY_TRACT
  Filled 2021-07-19 (×3): qty 3

## 2021-07-19 MED ORDER — TRAZODONE HCL 50 MG PO TABS: 150.0000 mg | ORAL_TABLET | Freq: Every evening | ORAL | Status: DC | PRN

## 2021-07-19 MED ORDER — PREDNISONE 20 MG PO TABS: 20.0000 mg | ORAL_TABLET | Freq: Two times a day (BID) | ORAL | Status: DC

## 2021-07-19 MED ORDER — FUROSEMIDE 10 MG/ML IJ SOLN
20.0000 mg | Freq: Two times a day (BID) | INTRAMUSCULAR | Status: DC
Start: 2021-07-19 — End: 2021-07-21
  Administered 2021-07-19 – 2021-07-21 (×4): 20 mg via INTRAVENOUS
  Filled 2021-07-19: qty 2
  Filled 2021-07-19: qty 4
  Filled 2021-07-19 (×2): qty 2

## 2021-07-19 MED ORDER — SODIUM CHLORIDE 0.9 % IV SOLN: 500.0000 mg | INTRAVENOUS | Status: DC

## 2021-07-19 MED ORDER — MORPHINE SULFATE (PF) 2 MG/ML IV SOLN: 2.0000 mg | INTRAVENOUS | Status: DC | PRN

## 2021-07-19 MED ORDER — EZETIMIBE 10 MG PO TABS
10.0000 mg | ORAL_TABLET | Freq: Every day | ORAL | Status: DC
Start: 2021-07-20 — End: 2021-07-20

## 2021-07-19 MED ORDER — VANCOMYCIN HCL 750 MG/150ML IV SOLN
750.0000 mg | Freq: Once | INTRAVENOUS | Status: AC
  Administered 2021-07-19: 750 mg via INTRAVENOUS
  Filled 2021-07-19: qty 150

## 2021-07-19 MED ORDER — METHIMAZOLE 10 MG PO TABS: 10.0000 mg | ORAL_TABLET | Freq: Two times a day (BID) | ORAL | Status: DC

## 2021-07-19 MED ORDER — GUAIFENESIN ER 600 MG PO TB12: 600.0000 mg | ORAL_TABLET | Freq: Two times a day (BID) | ORAL | Status: DC | PRN

## 2021-07-19 MED ORDER — METOPROLOL SUCCINATE ER 50 MG PO TB24
150.0000 mg | ORAL_TABLET | Freq: Every day | ORAL | Status: DC
Start: 2021-07-20 — End: 2021-07-20

## 2021-07-19 MED ORDER — AMIODARONE HCL 200 MG PO TABS: 200.0000 mg | ORAL_TABLET | Freq: Every day | ORAL | Status: DC

## 2021-07-19 MED ORDER — PREGABALIN 25 MG PO CAPS
25.0000 mg | ORAL_CAPSULE | Freq: Three times a day (TID) | ORAL | Status: DC
  Administered 2021-07-19: 25 mg via ORAL
  Filled 2021-07-19: qty 1

## 2021-07-19 MED ORDER — ONDANSETRON HCL 4 MG/2ML IJ SOLN
4.0000 mg | Freq: Once | INTRAMUSCULAR | Status: AC
  Administered 2021-07-19: 4 mg via INTRAVENOUS
  Filled 2021-07-19: qty 2

## 2021-07-19 MED ORDER — SODIUM CHLORIDE 0.9 % IV SOLN
500.0000 mg | Freq: Once | INTRAVENOUS | Status: AC
  Administered 2021-07-19: 500 mg via INTRAVENOUS
  Filled 2021-07-19: qty 5

## 2021-07-19 MED ORDER — ASPIRIN 81 MG PO TBEC
81.0000 mg | DELAYED_RELEASE_TABLET | Freq: Every day | ORAL | Status: DC
  Administered 2021-07-19: 81 mg via ORAL
  Filled 2021-07-19 (×2): qty 1

## 2021-07-19 MED ORDER — FLUTICASONE FUROATE-VILANTEROL 200-25 MCG/ACT IN AEPB
1.0000 | INHALATION_SPRAY | Freq: Every day | RESPIRATORY_TRACT | Status: DC
  Filled 2021-07-19: qty 28

## 2021-07-19 MED ORDER — HEPARIN SODIUM (PORCINE) 5000 UNIT/ML IJ SOLN: 5000.0000 [IU] | Freq: Two times a day (BID) | INTRAMUSCULAR | Status: DC

## 2021-07-19 MED ORDER — VANCOMYCIN HCL 1500 MG/300ML IV SOLN: 1500.0000 mg | INTRAVENOUS | Status: DC

## 2021-07-19 MED ORDER — METOPROLOL SUCCINATE ER 50 MG PO TB24
25.0000 mg | ORAL_TABLET | Freq: Every day | ORAL | Status: DC
  Administered 2021-07-19: 25 mg via ORAL
  Filled 2021-07-19: qty 1

## 2021-07-19 NOTE — Assessment & Plan Note (Signed)
D/d include PNA from aspiration vs CHF development.  We will cont abx and also get echo and treat for CHF with low dose lasix 20 mg iv q 12 x 1 day. supplemental oxygen,.strict I/o. Cardiology consulted suspect patient may need repeat Echo.  BNP pending.

## 2021-07-19 NOTE — Assessment & Plan Note (Addendum)
S/p CABG 1 month prior

## 2021-07-19 NOTE — Assessment & Plan Note (Addendum)
Multifactorial secondary to CHF with atrial fibrillation component, sepsis and pneumonia and underlying interstitial lung disease.

## 2021-07-19 NOTE — Assessment & Plan Note (Addendum)
Met criteria for severe sepsis due to respiratory failure, pneumonia source, tachycardia, tachypnea.  Despite ventilator support and IV antibiotics and fluids, patient did not recover.

## 2021-07-19 NOTE — Assessment & Plan Note (Addendum)
Possibly aspiration versus healthcare associated pneumonia given patient's timing skilled nursing.  Treated with IV Unasyn.  Unfortunately, due to patient's other comorbidities, he did not survive this hospitalization.

## 2021-07-19 NOTE — Progress Notes (Signed)
Pharmacy Antibiotic Note  Alan Stafford is a 76 y.o. 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  Pharmacy has been consulted for vancomycin and cefepime dosing.  Plan:  1) start cefepime 2 grams IV every 12 hours  2) start vancomycin 1500 mg IV Q 24 hrs (1750 mg LD administered in the ED) Goal AUC 400-550 Expected AUC: 536.7 SCr used: 1.71 mg/dL Ke 0.041 h-1, T1/2: 16.9 h MRSA PCR ordered for potential narrowing of antibiotics   Height: 6\' 2"  (188 cm) Weight: 94.8 kg (209 lb) IBW/kg (Calculated) : 82.2  Temp (24hrs), Avg:99 F (37.2 C), Min:99 F (37.2 C), Max:99 F (37.2 C)  Recent Labs  Lab 07/11/2021 1740  WBC 12.3*  CREATININE 1.71*  LATICACIDVEN 1.2    Estimated Creatinine Clearance: 43.4 mL/min (A) (by C-G formula based on SCr of 1.71 mg/dL (H)).    Allergies  Allergen Reactions   Statins Other (See Comments)    Leg weakness   Sulfa Antibiotics Rash    Antimicrobials this admission: 05/25 azithromycin >> 05/25 vancomycin >>  05/25 cefepime >>   Microbiology results: 05/25 BCx: pending  Thank you for allowing pharmacy to be a part of this patient's care.  Dallie Piles 07/15/2021 7:56 PM

## 2021-07-19 NOTE — Progress Notes (Signed)
ANTICOAGULATION CONSULT NOTE  Pharmacy Consult for warfarin Indication: atrial fibrillation  Allergies  Allergen Reactions   Statins Other (See Comments)    Leg weakness   Sulfa Antibiotics Rash    Patient Measurements: Height: 6\' 2"  (188 cm) Weight: 94.8 kg (209 lb) IBW/kg (Calculated) : 82.2  Vital Signs: Temp: 99 F (37.2 C) (05/25 1705) Temp Source: Oral (05/25 1705) BP: 119/82 (05/25 1915) Pulse Rate: 88 (05/25 1915)  Labs: Recent Labs    2021/08/10 1740  HGB 8.5*  HCT 28.0*  PLT 430*  APTT 38*  LABPROT 27.5*  INR 2.6*  CREATININE 1.71*  TROPONINIHS 44*    Estimated Creatinine Clearance: 43.4 mL/min (A) (by C-G formula based on SCr of 1.71 mg/dL (H)).   Medical History: Past Medical History:  Diagnosis Date   A-fib (HCC)    Abnormal heart rhythm    Arthritis    Asthma    CAP (community acquired pneumonia) 04/22/2016   Diabetes mellitus without complication (HCC)    Dysrhythmia    afib treated with flecainide   GERD (gastroesophageal reflux disease)    Hemoptysis 04/22/2016   Hypercholesteremia    Hypertension    Hyperthyroidism    Kidney stones    Sepsis (HCC) 04/22/2016    Assessment: 76yo with past medical history of HFpEF, paroxysmal atrial fibrillation (on warfarin, metoprolol, and flecainide), bilateral carotid stenosis who presented to the ED with SOB. Pharmacy is asked to dose warfarin while inpatient. INR is currently therapeutic on admission  Last warfarin regimen as follows: 3 mg on Monday/Wednesday/Friday  And  1.5 mg Saturday/Sunday/Tuesday/Thursday nights  DDIs: cefepime, vancomycin, azithromycin, methimazole  Goal of Therapy:  INR 2-3 Monitor platelets by anticoagulation protocol: Yes   Plan: warfarin 1 mg po tonight (slightly less than home regimen given DDIs) Daily INR to guide future doses   04/24/2016 08/10/21,8:36 PM

## 2021-07-19 NOTE — Assessment & Plan Note (Addendum)
Echocardiogram done 5/26 noted ejection fraction of 45 to 50% and grade 2 diastolic dysfunction.  Attempts to diurese with Lasix were only moderately successful, but overall, respiratory status was poor.

## 2021-07-19 NOTE — ED Triage Notes (Signed)
Pt coming from [facility] via [facility]. Facility and family called out due to pt c/o shortness of breath.   Per EMS, pt has had a recent CABG at [facility]. Pt also has had a recent pneumonia diagnosis. Pt is currently taking antibiotics.

## 2021-07-19 NOTE — ED Provider Notes (Signed)
[facility] Provider Note    Event Date/Time   First MD Initiated Contact with Patient Aug 18, 2021 1726     (approximate)   History   Shortness of Breath   HPI  Alan Stafford is a 76 y.o. male status post CABG compliant with his medications [facility] due to worsening shortness of breath cough and recent outpatient diagnosis of pneumonia failing outpatient treatment.  Feeling weak and fatigued.  Denies any abdominal pain no nausea or vomiting.  Was discharged from hospital post CABG without requiring oxygen is now needing 3.5 and steadily increasing over the past week.     Physical Exam   Triage Vital Signs: ED Triage Vitals  Enc Vitals Group     BP 2021/08/18 1705 131/68     Pulse Rate 08-18-2021 1705 77     Resp 2021/08/18 1705 16     Temp 08-18-2021 1705 99 F (37.2 C)     Temp Source August 18, 2021 1705 Oral     SpO2 Aug 18, 2021 1705 96 %     Weight Aug 18, 2021 1714 209 lb (94.8 kg)     Height Aug 18, 2021 1714 6\' 2"  (1.88 m)     Head Circumference --      [facility] --      Pain Score August 18, 2021 1713 6     Pain Loc --      Pain Edu? --      Excl. in GC? --     Most recent vital signs: Vitals:   Aug 18, 2021 1900 08/18/21 1915  BP: 107/69 119/82  Pulse: 72 88  Resp: (!) 25 (!) 24  Temp:    SpO2: 96% 95%     Constitutional: Alert  Eyes: Conjunctivae are normal.  Head: Atraumatic. Nose: No congestion/rhinnorhea. Mouth/Throat: Mucous membranes are moist.   Neck: Painless ROM.  Cardiovascular:   Good peripheral circulation. No mg/r/ Respiratory: Normal respiratory effort.  No retractions. Coarse bibasilar bs Gastrointestinal: Soft and nontender.  Musculoskeletal:  no deformity Neurologic:  MAE spontaneously. No gross focal neurologic deficits are appreciated.  Skin:  Skin is warm, dry and intact. No rash noted. Psychiatric: Mood and affect are normal. Speech and behavior are normal.    ED Results / Procedures / Treatments   Labs (all  labs ordered are listed, but only abnormal results are displayed) Labs Reviewed  COMPREHENSIVE METABOLIC PANEL - Abnormal; Notable for the following components:      Result Value   Sodium 134 (*)    Glucose, Bld 144 (*)    BUN 27 (*)    Creatinine, Ser 1.71 (*)    Calcium 8.3 (*)    Albumin 2.3 (*)    AST 84 (*)    GFR, Estimated 41 (*)    All other components within normal limits  CBC WITH DIFFERENTIAL/PLATELET - Abnormal; Notable for the following components:   WBC 12.3 (*)    RBC 3.19 (*)    Hemoglobin 8.5 (*)    HCT 28.0 (*)    RDW 15.9 (*)    Platelets 430 (*)    Neutro Abs 10.5 (*)    Abs Immature Granulocytes 0.09 (*)    All other components within normal limits  PROTIME-INR - Abnormal; Notable for the following components:   Prothrombin Time 27.5 (*)    INR 2.6 (*)    All other components within normal limits  APTT - Abnormal; Notable for the following components:   aPTT 38 (*)    All other  components within normal limits  TROPONIN I (HIGH SENSITIVITY) - Abnormal; Notable for the following components:   Troponin I (High Sensitivity) 44 (*)    All other components within normal limits  CULTURE, BLOOD (ROUTINE X 2)  CULTURE, BLOOD (ROUTINE X 2)  SARS CORONAVIRUS 2 BY RT PCR  LACTIC ACID, PLASMA  LACTIC ACID, PLASMA  URINALYSIS, COMPLETE (UACMP) WITH MICROSCOPIC  CBC  BASIC METABOLIC PANEL  CBC  TROPONIN I (HIGH SENSITIVITY)     EKG  ED ECG REPORT I, Willy Eddy, the attending physician, personally viewed and interpreted this ECG.   Date: Aug 11, 2021  EKG Time: 17:09  Rate: 80  Rhythm: sinus  Axis: normal  Intervals:  borderline prolonged qt  ST&T Change: no stemi, nonspecific st abn    RADIOLOGY Please see ED Course for my review and interpretation.  I personally reviewed all radiographic images ordered to evaluate for the above acute complaints and reviewed radiology reports and findings.  These findings were personally discussed with the  patient.  Please see medical record for radiology report.    PROCEDURES:  Critical Care performed: Yes, see critical care procedure note(s)  Procedures   MEDICATIONS ORDERED IN ED: Medications  vancomycin (VANCOCIN) IVPB 1000 mg/200 mL premix (1,000 mg Intravenous New Bag/Given Aug 11, 2021 1915)  azithromycin (ZITHROMAX) 500 mg in sodium chloride 0.9 % 250 mL IVPB (500 mg Intravenous New Bag/Given 08-11-2021 1914)  vancomycin (VANCOREADY) IVPB 750 mg/150 mL (has no administration in time range)  ondansetron (ZOFRAN) injection 4 mg (has no administration in time range)  azithromycin (ZITHROMAX) 500 mg in sodium chloride 0.9 % 250 mL IVPB (has no administration in time range)  aspirin EC tablet 81 mg (has no administration in time range)  metoprolol succinate (TOPROL-XL) 24 hr tablet 25 mg (has no administration in time range)  methimazole (TAPAZOLE) tablet 10 mg (has no administration in time range)  pantoprazole (PROTONIX) injection 40 mg (has no administration in time range)  pregabalin (LYRICA) capsule 25 mg (has no administration in time range)  mometasone-formoterol (DULERA) 200-5 MCG/ACT inhaler 2 puff (has no administration in time range)  0.9 %  sodium chloride infusion (has no administration in time range)  acetaminophen (TYLENOL) tablet 650 mg (has no administration in time range)    Or  acetaminophen (TYLENOL) suppository 650 mg (has no administration in time range)  HYDROcodone-acetaminophen (NORCO/VICODIN) 5-325 MG per tablet 1-2 tablet (has no administration in time range)  morphine (PF) 2 MG/ML injection 2 mg (has no administration in time range)  docusate sodium (COLACE) capsule 100 mg (has no administration in time range)  albuterol (PROVENTIL) (2.5 MG/3ML) 0.083% nebulizer solution 2.5 mg (has no administration in time range)  guaiFENesin (MUCINEX) 12 hr tablet 600 mg (has no administration in time range)  hydrALAZINE (APRESOLINE) injection 5 mg (has no administration in  time range)  heparin injection 5,000 Units (has no administration in time range)  ipratropium-albuterol (DUONEB) 0.5-2.5 (3) MG/3ML nebulizer solution 3 mL (has no administration in time range)  ceFEPIme (MAXIPIME) 2 g in sodium chloride 0.9 % 100 mL IVPB (2 g Intravenous New Bag/Given Aug 11, 2021 1911)     IMPRESSION / MDM / ASSESSMENT AND PLAN / ED COURSE  I reviewed the triage vital signs and the nursing notes.                              Differential diagnosis includes, but is not limited to, Asthma, copd, CHF,  pna, ptx, malignancy, Pe, anemia  Patient presented to the ER with symptoms as described above patient with evidence of acute respiratory failure with hypoxia with outpatient diagnosis of pneumonia failing outpatient treatment  This presenting complaint could reflect a potentially life-threatening illness therefore the patient will be placed on continuous pulse oximetry and telemetry for monitoring.  Laboratory evaluation will be sent to evaluate for the above complaints.      Clinical Course as of 2021-12-08 1957  Thu Jul 19, 2021  1856 Chest x-ray on my interpretation does not show any evidence of pneumothorax.  Does show evidence of patchy opacity consistent with consolidation and pneumonia failing outpatient treatment will order IV antibiotics.  Given his acute and worsening hypoxia I do feel he will require admission to the hospital. [PR]  1933 Case discussed in consultation with hospitalist who has accepted patient to their service. [PR]    Clinical Course User Index [PR] Willy Eddyobinson, Eldana Isip, MD    Patient's presentation is most consistent with acute presentation with potential threat to life or bodily function.   FINAL CLINICAL IMPRESSION(S) / ED DIAGNOSES   Final diagnoses:  Community acquired pneumonia of right lung, unspecified part of lung  Acute respiratory failure with hypoxia (HCC)     Rx / DC Orders   ED Discharge Orders     None        Note:  This  document was prepared using Dragon voice recognition software and may include unintentional dictation errors.    Willy Eddyobinson, Analee Montee, MD 2021-12-08 [id number]

## 2021-07-19 NOTE — Assessment & Plan Note (Signed)
Pharmacy protocol for coumadin for PAF.

## 2021-07-19 NOTE — Progress Notes (Signed)
PHARMACY -  BRIEF ANTIBIOTIC NOTE   Pharmacy has received consult(s) for vancomycin and cefepime from an ED provider.  The patient's profile has been reviewed for ht/wt/allergies/indication/available labs.    One time orders placed for vancomycin 1750 mg and cefepime 2 grams x 1  Further antibiotics/pharmacy consults should be ordered by admitting physician if indicated.                       Thank you, Jaynie Bream, PharmD Pharmacy Resident  07/13/2021 7:03 PM

## 2021-07-19 NOTE — Assessment & Plan Note (Addendum)
Initially on methimazole.  Once he was intubated, this medication was held

## 2021-07-19 NOTE — H&P (Signed)
History and Physical    Patient: Alan Stafford ZOX:[id number] DOB: [birth year] DOA: 2021/11/20 DOS: the patient was seen and examined on 2021/11/20 PCP: Lindwood QuaHoffman, Byron, MD  [facility] Chief Complaint:  Chief Complaint  Patient presents with   Shortness of Breath   HPI: Alan Stafford is a 76 y.o. male with medical history significant of CAD and coming for SOB and hypoxic.  Patient was admitted on 4/27/20023 and [facility], 2021 for coronary artery bypass graft.  Patient on the 27th had a CT chest which was concerning for multifocal pneumonia and aspiration or chronic underlying interstitial lung disease superimposed with infection or aspiration.  [facility] was discharged to short-term rehab and was noted to have shortness of breath and brought to the hospital today.  Patient is currently taking antibiotics and prednisone.  Not sure about if this is unresolved and recurrent from aspiration.   Review of Systems:  Review of Systems  Respiratory:  Positive for cough and shortness of breath.   Neurological:  Positive for weakness.  All other systems reviewed and are negative.  Past Medical History:  Diagnosis Date   A-fib (HCC)    Abnormal heart rhythm    Arthritis    Asthma    CAP (community acquired pneumonia) 04/22/2016   Diabetes mellitus without complication (HCC)    Dysrhythmia    afib treated with flecainide   GERD (gastroesophageal reflux disease)    Hemoptysis 04/22/2016   Hypercholesteremia    Hypertension    Hyperthyroidism    Kidney stones    Sepsis (HCC) 04/22/2016   Past Surgical History:  Procedure Laterality Date   CATARACT EXTRACTION W/PHACO Left 09/26/2016   Procedure: CATARACT EXTRACTION PHACO AND INTRAOCULAR LENS PLACEMENT (IOC);  Surgeon: Nevada CraneKing, Bradley Mark, MD;  Location: [facility] ORS;  Service: Ophthalmology;  Laterality: Left;  US 00:52.0 AP% 17.0 CDE 8.86 Fluid pack lot # [id number]   EYE SURGERY Right  2015   cataract   JOINT REPLACEMENT Right    knee   KNEE SURGERY Right 2015   total knee replacement revision   LEFT HEART CATH AND CORONARY ANGIOGRAPHY N/A 06/25/2021   Procedure: LEFT HEART CATH AND CORONARY ANGIOGRAPHY;  Surgeon: Marcina MillardParaschos, Alexander, MD;  Location: [facility] INVASIVE CV LAB;  Service: Cardiovascular;  Laterality: N/A;   Social History:  reports that he has never smoked. He uses smokeless tobacco. He reports that he does not drink alcohol and does not use drugs.  Allergies  Allergen Reactions   Statins Other (See Comments)    Leg weakness   Sulfa Antibiotics Rash    Family History  Problem Relation Age of Onset   Dementia Mother    Heart disease Father     Prior to Admission medications   Medication Sig Start Date End Date Taking? Authorizing Provider  Ampicillin-Sulbactam 3 g in sodium chloride 0.9 % 100 mL Inject 3 g into the vein every 6 (six) hours. 06/25/21   Wouk, Wilfred CurtisNoah Bedford, MD  aspirin EC 81 MG EC tablet Take 1 tablet (81 mg total) by mouth daily. Swallow whole. 06/25/21   Wouk, Wilfred CurtisNoah Bedford, MD  methimazole (TAPAZOLE) 10 MG tablet Take 1 tablet (10 mg total) by mouth 2 (two) times daily. 06/25/21   Wouk, Wilfred CurtisNoah Bedford, MD  metoprolol succinate (TOPROL-XL) 25 MG 24 hr tablet Take 1 tablet (25 mg total) by mouth daily. 06/25/21   Wouk, Wilfred CurtisNoah Bedford, MD  mometasone-formoterol [facility](DULERA) 200-5 MCG/ACT AERO  Inhale 2 puffs into the lungs 2 (two) times daily. 06/25/21   Wouk, Wilfred Curtis, MD  pantoprazole (PROTONIX) 40 MG injection Inject 40 mg into the vein every 12 (twelve) hours. 06/25/21   Wouk, Wilfred Curtis, MD  pregabalin (LYRICA) 25 MG capsule Take 1 capsule (25 mg total) by mouth 3 (three) times daily. 06/25/21   Kathrynn Running, MD    Physical Exam: Vitals:   Jul 21, 2021 1800 21-Jul-2021 1815 07/21/21 1900 07-21-2021 1915  BP: 119/67 111/68 107/69 119/82  Pulse: 74 73 72 88  Resp: (!) 22 (!) 22 (!) 25 (!) 24  Temp:      TempSrc:      SpO2: 96% 96% 96% 95%  Weight:       Height:      Physical Exam Vitals and nursing note reviewed.  Constitutional:      General: He is not in acute distress.    Appearance: Normal appearance. He is not ill-appearing, toxic-appearing or diaphoretic.  HENT:     Head: Normocephalic and atraumatic.     Right Ear: Hearing and external ear normal.     Left Ear: Hearing and external ear normal.     Nose: Nose normal. No nasal deformity.     Mouth/Throat:     Lips: Pink.     Mouth: Mucous membranes are moist.     Tongue: No lesions.     Pharynx: Oropharynx is clear.  Eyes:     Extraocular Movements: Extraocular movements intact.     Pupils: Pupils are equal, round, and reactive to light.  Neck:     Vascular: No carotid bruit.  Cardiovascular:     Rate and Rhythm: Normal rate. Rhythm irregular.     Pulses: Normal pulses.     Heart sounds: Normal heart sounds.  Pulmonary:     Effort: Pulmonary effort is normal.     Breath sounds: Examination of the right-lower field reveals wheezing. Examination of the left-lower field reveals wheezing. Wheezing present.  Abdominal:     General: Bowel sounds are normal. There is no distension.     Palpations: Abdomen is soft. There is no mass.     Tenderness: There is no abdominal tenderness. There is no guarding.     Hernia: No hernia is present.  Musculoskeletal:     Right lower leg: No edema.     Left lower leg: No edema.  Skin:    General: Skin is warm.  Neurological:     General: No focal deficit present.     Mental Status: He is alert and oriented to person, place, and time.     Cranial Nerves: Cranial nerves 2-12 are intact.     Motor: Motor function is intact.  Psychiatric:        Attention and Perception: Attention normal.        Mood and Affect: Mood normal.        Speech: Speech normal.        Behavior: Behavior normal. Behavior is cooperative.        Cognition and Memory: Cognition normal.    Data Reviewed: Results for orders placed or performed during the  hospital encounter of 07-21-21 (from the past 24 hour(s))  Lactic acid, plasma     Status: None   Collection Time: 2021-07-21  5:40 PM  Result Value Ref Range   Lactic Acid, Venous 1.2 0.5 - 1.9 mmol/L  Comprehensive metabolic panel     Status: Abnormal   Collection Time: 07-21-2021  5:40 PM  Result Value Ref Range   Sodium 134 (L) 135 - 145 mmol/L   Potassium 4.3 3.5 - 5.1 mmol/L   Chloride 103 98 - 111 mmol/L   CO2 22 22 - 32 mmol/L   Glucose, Bld 144 (H) 70 - 99 mg/dL   BUN 27 (H) 8 - 23 mg/dL   Creatinine, Ser 4.09 (H) 0.61 - 1.24 mg/dL   Calcium 8.3 (L) 8.9 - 10.3 mg/dL   Total Protein 7.5 6.5 - 8.1 g/dL   Albumin 2.3 (L) 3.5 - 5.0 g/dL   AST 84 (H) 15 - 41 U/L   ALT 40 0 - 44 U/L   Alkaline Phosphatase 101 38 - 126 U/L   Total Bilirubin 0.9 0.3 - 1.2 mg/dL   GFR, Estimated 41 (L) >60 mL/min   Anion gap 9 5 - 15  CBC with Differential     Status: Abnormal   Collection Time: 07/18/2021  5:40 PM  Result Value Ref Range   WBC 12.3 (H) 4.0 - 10.5 K/uL   RBC 3.19 (L) 4.22 - 5.81 MIL/uL   Hemoglobin 8.5 (L) 13.0 - 17.0 g/dL   HCT 81.1 (L) 91.4 - 78.2 %   MCV 87.8 80.0 - 100.0 fL   MCH 26.6 26.0 - 34.0 pg   MCHC 30.4 30.0 - 36.0 g/dL   RDW 95.6 (H) 21.3 - 08.6 %   Platelets 430 (H) 150 - 400 K/uL   nRBC 0.0 0.0 - 0.2 %   Neutrophils Relative % 85 %   Neutro Abs 10.5 (H) 1.7 - 7.7 K/uL   Lymphocytes Relative 8 %   Lymphs Abs 1.0 0.7 - 4.0 K/uL   Monocytes Relative 6 %   Monocytes Absolute 0.8 0.1 - 1.0 K/uL   Eosinophils Relative 0 %   Eosinophils Absolute 0.0 0.0 - 0.5 K/uL   Basophils Relative 0 %   Basophils Absolute 0.0 0.0 - 0.1 K/uL   Immature Granulocytes 1 %   Abs Immature Granulocytes 0.09 (H) 0.00 - 0.07 K/uL  Protime-INR     Status: Abnormal   Collection Time: 07/15/2021  5:40 PM  Result Value Ref Range   Prothrombin Time 27.5 (H) 11.4 - 15.2 seconds   INR 2.6 (H) 0.8 - 1.2  APTT     Status: Abnormal   Collection Time: 06/29/2021  5:40 PM  Result Value Ref Range    aPTT 38 (H) 24 - 36 seconds  Troponin I (High Sensitivity)     Status: Abnormal   Collection Time: 07/15/2021  5:40 PM  Result Value Ref Range   Troponin I (High Sensitivity) 44 (H) <18 ng/L     Assessment and Plan: * SOB (shortness of breath) D/d include PNA from aspiration vs CHF development.  We will cont abx and also get echo and treat for CHF with low dose lasix 20 mg iv q 12 x 1 day. supplemental oxygen,.strict I/o. Cardiology consulted suspect patient may need repeat Echo.  BNP pending.  Acute respiratory failure with hypoxia (HCC) SOB worsening over past 3 days , suspect from CHF. We will treat and evaluate for other differentials.     Sepsis (HCC) Pt meets sepsis criteria and we will follow  And obtain procalcitonin level.  supportive care with antipyretic and antiemetic.s     Chronic anticoagulation (Coumadin) Pharmacy protocol for coumadin for PAF.  Paroxysmal atrial fibrillation (HCC) PAF history currently in sinus.  Pt has chad2vasc score of 4.  Pt was on coumadin that  was off and has been restarted per pharm med rec.     CAD in native artery S/p CABG. We will continue metoprolol/ asa/ / hydralazine / pt allergic to statin.   Hyperthyroidism Pt is continued on methimazole.    Acute on chronic systolic congestive heart failure (HCC) -PRN lasix given x 2 doses.  -echo per cardiology consult.    Pneumonia ? PNA or CHF orboth. Less likely CAP and more likely aspiration. We will do bedside swallow and cont iv abx.     Advance Care Planning:    Code Status: Full Code      Consults:  Am cardiology consult Dr.Orgel.    Family Communication:  REYAAN, THOMA (Spouse)  [phone] (Work Phone)   Severity of Illness: The appropriate patient status for this patient is INPATIENT. Inpatient status is judged to be reasonable and necessary in order to provide the required intensity of service to ensure the patient's safety. The patient's  presenting symptoms, physical exam findings, and initial radiographic and laboratory data in the context of their chronic comorbidities is felt to place them at high risk for further clinical deterioration. Furthermore, it is not anticipated that the patient will be medically stable for discharge from the hospital within 2 midnights of admission.   * I certify that at the point of admission it is my clinical judgment that the patient will require inpatient hospital care spanning beyond 2 midnights from the point of admission due to high intensity of service, high risk for further deterioration and high frequency of surveillance required.*  Author: Gertha Calkin, MD 08/08/21 9:02 PM  For on call review [email or web address].

## 2021-07-19 NOTE — ED Notes (Signed)
This RN gave pt a cup of apple juice and graham crackers.

## 2021-07-19 NOTE — Assessment & Plan Note (Addendum)
Paroxysmal atrial fibrillation and due to worsening respiratory status, went to rapid atrial fibrillation.

## 2021-07-20 ENCOUNTER — Inpatient Hospital Stay: Payer: Medicare Other

## 2021-07-20 ENCOUNTER — Inpatient Hospital Stay
Admit: 2021-07-20 | Discharge: 2021-07-20 | Disposition: A | Discharge status: Home / Self Care | Payer: Medicare Other | Attending: Pulmonary Disease | Admitting: Pulmonary Disease

## 2021-07-20 DIAGNOSIS — J9601 Acute respiratory failure with hypoxia: Secondary | ICD-10-CM | POA: Diagnosis not present

## 2021-07-20 DIAGNOSIS — L8990 Pressure ulcer of unspecified site, unspecified stage: Secondary | ICD-10-CM | POA: Insufficient documentation

## 2021-07-20 DIAGNOSIS — I5023 Acute on chronic systolic (congestive) heart failure: Secondary | ICD-10-CM | POA: Diagnosis not present

## 2021-07-20 DIAGNOSIS — I480 Paroxysmal atrial fibrillation: Secondary | ICD-10-CM

## 2021-07-20 DIAGNOSIS — R918 Other nonspecific abnormal finding of lung field: Secondary | ICD-10-CM

## 2021-07-20 DIAGNOSIS — J8489 Other specified interstitial pulmonary diseases: Secondary | ICD-10-CM

## 2021-07-20 DIAGNOSIS — R0602 Shortness of breath: Secondary | ICD-10-CM | POA: Diagnosis not present

## 2021-07-20 DIAGNOSIS — R0902 Hypoxemia: Secondary | ICD-10-CM | POA: Insufficient documentation

## 2021-07-20 DIAGNOSIS — E0590 Thyrotoxicosis, unspecified without thyrotoxic crisis or storm: Secondary | ICD-10-CM

## 2021-07-20 LAB — COMPREHENSIVE METABOLIC PANEL
ALT: 54 U/L — ABNORMAL HIGH (ref 0–44)
AST: 126 U/L — ABNORMAL HIGH (ref 15–41)
Albumin: 2.3 g/dL — ABNORMAL LOW (ref 3.5–5.0)
Alkaline Phosphatase: 129 U/L — ABNORMAL HIGH (ref 38–126)
Anion gap: 9 (ref 5–15)
BUN: 30 mg/dL — ABNORMAL HIGH (ref 8–23)
CO2: 23 mmol/L (ref 22–32)
Calcium: 8.2 mg/dL — ABNORMAL LOW (ref 8.9–10.3)
Chloride: 105 mmol/L (ref 98–111)
Creatinine, Ser: 2.06 mg/dL — ABNORMAL HIGH (ref 0.61–1.24)
GFR, Estimated: 33 mL/min — ABNORMAL LOW (ref >60)
Glucose, Bld: 104 mg/dL — ABNORMAL HIGH (ref 70–99)
Potassium: 4.6 mmol/L (ref 3.5–5.1)
Sodium: 137 mmol/L (ref 135–145)
Total Bilirubin: 1.6 mg/dL — ABNORMAL HIGH (ref 0.3–1.2)
Total Protein: 7.7 g/dL (ref 6.5–8.1)

## 2021-07-20 LAB — BLOOD GAS, ARTERIAL
Acid-Base Excess: 0.2 mmol/L (ref 0.0–2.0)
Acid-base deficit: 2.6 mmol/L — ABNORMAL HIGH (ref 0.0–2.0)
Bicarbonate: 24.6 mmol/L (ref 20.0–28.0)
Bicarbonate: 23.7 mmol/L (ref 20.0–28.0)
FIO2: 100 %
MECHVT: 500 mL
O2 Content: 6 L/min
O2 Saturation: 87 %
O2 Saturation: 98.8 %
PEEP: 5 cmH2O
Patient temperature: 37
Patient temperature: 37
RATE: 15 resp/min
pCO2 arterial: 38 mmHg (ref 32–48)
pCO2 arterial: 46 mmHg (ref 32–48)
pH, Arterial: 7.42 (ref 7.35–7.45)
pH, Arterial: 7.32 — ABNORMAL LOW (ref 7.35–7.45)
pO2, Arterial: 57 mmHg — ABNORMAL LOW (ref 83–108)
pO2, Arterial: 223 mmHg — ABNORMAL HIGH (ref 83–108)

## 2021-07-20 LAB — PROTIME-INR
INR: 3 — ABNORMAL HIGH (ref 0.8–1.2)
Prothrombin Time: 30.9 seconds — ABNORMAL HIGH (ref 11.4–15.2)

## 2021-07-20 LAB — ECHOCARDIOGRAM COMPLETE
AR max vel: 2.68 cm2
AV Area VTI: 2.82 cm2
AV Area mean vel: 2.34 cm2
AV Mean grad: 7 mmHg
AV Peak grad: 13.7 mmHg
Ao pk vel: 1.85 m/s
Area-P 1/2: 4.31 cm2
Calc EF: 51 %
Height: 74 in
MV M vel: 4.67 m/s
MV Peak grad: 87.2 mmHg
MV VTI: 3.21 cm2
S' Lateral: 3.79 cm
Single Plane A2C EF: 49.3 %
Single Plane A4C EF: 51.4 %
Weight: 3344 oz

## 2021-07-20 LAB — TROPONIN I (HIGH SENSITIVITY)
Troponin I (High Sensitivity): 46 ng/L — ABNORMAL HIGH (ref <18)
Troponin I (High Sensitivity): 54 ng/L — ABNORMAL HIGH (ref <18)

## 2021-07-20 LAB — HEMOGLOBIN A1C
Hgb A1c MFr Bld: 6.3 % — ABNORMAL HIGH (ref 4.8–5.6)
Mean Plasma Glucose: 134.11 mg/dL

## 2021-07-20 LAB — BRAIN NATRIURETIC PEPTIDE: B Natriuretic Peptide: 984.1 pg/mL — ABNORMAL HIGH (ref 0.0–100.0)

## 2021-07-20 LAB — CBC WITH DIFFERENTIAL/PLATELET
Abs Immature Granulocytes: 0.14 10*3/uL — ABNORMAL HIGH (ref 0.00–0.07)
Basophils Absolute: 0.1 10*3/uL (ref 0.0–0.1)
Basophils Relative: 0 %
Eosinophils Absolute: 0.1 10*3/uL (ref 0.0–0.5)
Eosinophils Relative: 1 %
HCT: 29.3 % — ABNORMAL LOW (ref 39.0–52.0)
Hemoglobin: 8.7 g/dL — ABNORMAL LOW (ref 13.0–17.0)
Immature Granulocytes: 1 %
Lymphocytes Relative: 4 %
Lymphs Abs: 0.7 10*3/uL (ref 0.7–4.0)
MCH: 26.3 pg (ref 26.0–34.0)
MCHC: 29.7 g/dL — ABNORMAL LOW (ref 30.0–36.0)
MCV: 88.5 fL (ref 80.0–100.0)
Monocytes Absolute: 0.9 10*3/uL (ref 0.1–1.0)
Monocytes Relative: 6 %
Neutro Abs: 14.7 10*3/uL — ABNORMAL HIGH (ref 1.7–7.7)
Neutrophils Relative %: 88 %
Platelets: 467 10*3/uL — ABNORMAL HIGH (ref 150–400)
RBC: 3.31 MIL/uL — ABNORMAL LOW (ref 4.22–5.81)
RDW: 16.1 % — ABNORMAL HIGH (ref 11.5–15.5)
WBC: 16.7 10*3/uL — ABNORMAL HIGH (ref 4.0–10.5)
nRBC: 0 % (ref 0.0–0.2)

## 2021-07-20 LAB — PHOSPHORUS: Phosphorus: 4.7 mg/dL — ABNORMAL HIGH (ref 2.5–4.6)

## 2021-07-20 LAB — T4, FREE: Free T4: 0.91 ng/dL (ref 0.61–1.12)

## 2021-07-20 LAB — GLUCOSE, CAPILLARY
Glucose-Capillary: 113 mg/dL — ABNORMAL HIGH (ref 70–99)
Glucose-Capillary: 120 mg/dL — ABNORMAL HIGH (ref 70–99)
Glucose-Capillary: 153 mg/dL — ABNORMAL HIGH (ref 70–99)
Glucose-Capillary: 51 mg/dL — ABNORMAL LOW (ref 70–99)
Glucose-Capillary: 53 mg/dL — ABNORMAL LOW (ref 70–99)
Glucose-Capillary: 64 mg/dL — ABNORMAL LOW (ref 70–99)
Glucose-Capillary: 77 mg/dL (ref 70–99)
Glucose-Capillary: 78 mg/dL (ref 70–99)
Glucose-Capillary: 80 mg/dL (ref 70–99)

## 2021-07-20 LAB — TSH: TSH: 1.866 u[IU]/mL (ref 0.350–4.500)

## 2021-07-20 LAB — CBG MONITORING, ED: Glucose-Capillary: 79 mg/dL (ref 70–99)

## 2021-07-20 LAB — MRSA NEXT GEN BY PCR, NASAL: MRSA by PCR Next Gen: DETECTED — AB

## 2021-07-20 LAB — SEDIMENTATION RATE: Sed Rate: 126 mm/hr — ABNORMAL HIGH (ref 0–20)

## 2021-07-20 LAB — MAGNESIUM: Magnesium: 1.9 mg/dL (ref 1.7–2.4)

## 2021-07-20 LAB — PROCALCITONIN: Procalcitonin: 0.3 ng/mL

## 2021-07-20 LAB — LACTIC ACID, PLASMA: Lactic Acid, Venous: 0.9 mmol/L (ref 0.5–1.9)

## 2021-07-20 MED ORDER — MIDAZOLAM HCL 2 MG/2ML IJ SOLN
1.0000 mg | INTRAMUSCULAR | Status: AC | PRN
  Administered 2021-07-20: 1 mg via INTRAVENOUS
  Filled 2021-07-20: qty 2

## 2021-07-20 MED ORDER — CHLORHEXIDINE GLUCONATE CLOTH 2 % EX PADS
6.0000 | MEDICATED_PAD | Freq: Every day | CUTANEOUS | Status: DC
  Administered 2021-07-20 – 2021-07-26 (×5): 6 via TOPICAL

## 2021-07-20 MED ORDER — MIDAZOLAM HCL 2 MG/2ML IJ SOLN
INTRAMUSCULAR | Status: AC
  Administered 2021-07-20: 1 mg via INTRAVENOUS
  Filled 2021-07-20: qty 4

## 2021-07-20 MED ORDER — FENTANYL BOLUS VIA INFUSION: 25.0000 ug | INTRAVENOUS | Status: DC | PRN

## 2021-07-20 MED ORDER — CHLORHEXIDINE GLUCONATE CLOTH 2 % EX PADS
6.0000 | MEDICATED_PAD | Freq: Every day | CUTANEOUS | Status: AC
  Administered 2021-07-20 – 2021-07-24 (×4): 6 via TOPICAL

## 2021-07-20 MED ORDER — WARFARIN SODIUM 1 MG PO TABS
1.0000 mg | ORAL_TABLET | Freq: Once | ORAL | Status: AC
  Administered 2021-07-20: 1 mg
  Filled 2021-07-20: qty 1

## 2021-07-20 MED ORDER — DOCUSATE SODIUM 50 MG/5ML PO LIQD
100.0000 mg | Freq: Two times a day (BID) | ORAL | Status: DC
  Administered 2021-07-20 – 2021-07-23 (×7): 100 mg
  Filled 2021-07-20 (×8): qty 10

## 2021-07-20 MED ORDER — FENTANYL CITRATE PF 50 MCG/ML IJ SOSY: 25.0000 ug | PREFILLED_SYRINGE | INTRAMUSCULAR | Status: DC | PRN

## 2021-07-20 MED ORDER — MIDAZOLAM HCL 2 MG/2ML IJ SOLN
2.0000 mg | Freq: Once | INTRAMUSCULAR | Status: AC
  Administered 2021-07-20: 2 mg via INTRAVENOUS

## 2021-07-20 MED ORDER — FENTANYL CITRATE PF 50 MCG/ML IJ SOSY
PREFILLED_SYRINGE | INTRAMUSCULAR | Status: AC
  Administered 2021-07-20: 25 ug via INTRAVENOUS
  Filled 2021-07-20: qty 2

## 2021-07-20 MED ORDER — FENTANYL CITRATE PF 50 MCG/ML IJ SOSY
25.0000 ug | PREFILLED_SYRINGE | INTRAMUSCULAR | Status: DC | PRN
  Administered 2021-07-20: 50 ug via INTRAVENOUS
  Filled 2021-07-20: qty 1

## 2021-07-20 MED ORDER — MIDAZOLAM HCL 2 MG/2ML IJ SOLN
1.0000 mg | INTRAMUSCULAR | Status: DC | PRN
  Administered 2021-07-21 – 2021-07-24 (×3): 1 mg via INTRAVENOUS
  Filled 2021-07-20 (×3): qty 2

## 2021-07-20 MED ORDER — PROCHLORPERAZINE EDISYLATE 10 MG/2ML IJ SOLN: 10.0000 mg | Freq: Four times a day (QID) | INTRAMUSCULAR | Status: DC | PRN

## 2021-07-20 MED ORDER — ACETAMINOPHEN 325 MG PO TABS: 650.0000 mg | ORAL_TABLET | Freq: Four times a day (QID) | ORAL | Status: DC | PRN

## 2021-07-20 MED ORDER — ESCITALOPRAM OXALATE 20 MG PO TABS
20.0000 mg | ORAL_TABLET | Freq: Every day | ORAL | Status: DC
  Administered 2021-07-20 – 2021-07-24 (×5): 20 mg
  Filled 2021-07-20 (×6): qty 1

## 2021-07-20 MED ORDER — MUPIROCIN 2 % EX OINT
1.0000 "application " | TOPICAL_OINTMENT | Freq: Two times a day (BID) | CUTANEOUS | Status: AC
  Administered 2021-07-20 – 2021-07-24 (×10): 1 via NASAL
  Filled 2021-07-20: qty 22

## 2021-07-20 MED ORDER — METHIMAZOLE 10 MG PO TABS
10.0000 mg | ORAL_TABLET | Freq: Every day | ORAL | Status: DC
  Administered 2021-07-21 – 2021-07-24 (×4): 10 mg
  Filled 2021-07-20 (×4): qty 1

## 2021-07-20 MED ORDER — MIDAZOLAM HCL 2 MG/2ML IJ SOLN
4.0000 mg | Freq: Once | INTRAMUSCULAR | Status: AC
  Administered 2021-07-20: 4 mg via INTRAVENOUS

## 2021-07-20 MED ORDER — FENTANYL 2500MCG IN NS 250ML (10MCG/ML) PREMIX INFUSION
25.0000 ug/h | INTRAVENOUS | Status: DC
  Administered 2021-07-20: 25 ug/h via INTRAVENOUS
  Filled 2021-07-20: qty 250

## 2021-07-20 MED ORDER — DEXTROSE 50 % IV SOLN
12.5000 g | INTRAVENOUS | Status: AC
  Administered 2021-07-20: 12.5 g via INTRAVENOUS
  Filled 2021-07-20: qty 50

## 2021-07-20 MED ORDER — ROCURONIUM BROMIDE 10 MG/ML (PF) SYRINGE
PREFILLED_SYRINGE | INTRAVENOUS | Status: AC
  Filled 2021-07-20: qty 10

## 2021-07-20 MED ORDER — FUROSEMIDE 10 MG/ML IJ SOLN
40.0000 mg | Freq: Once | INTRAMUSCULAR | Status: AC
Start: 2021-07-20 — End: 2021-07-20
  Administered 2021-07-20: 40 mg via INTRAVENOUS
  Filled 2021-07-20: qty 4

## 2021-07-20 MED ORDER — ORAL CARE MOUTH RINSE
15.0000 mL | OROMUCOSAL | Status: DC
  Administered 2021-07-20 – 2021-07-24 (×42): 15 mL via OROMUCOSAL

## 2021-07-20 MED ORDER — ACETAMINOPHEN 650 MG RE SUPP: 650.0000 mg | Freq: Four times a day (QID) | RECTAL | Status: DC | PRN

## 2021-07-20 MED ORDER — DEXTROSE 50 % IV SOLN
INTRAVENOUS | Status: AC
  Administered 2021-07-20: 25 g via INTRAVENOUS
  Filled 2021-07-20: qty 50

## 2021-07-20 MED ORDER — ASPIRIN 81 MG PO CHEW
81.0000 mg | CHEWABLE_TABLET | Freq: Every day | ORAL | Status: DC
  Administered 2021-07-20 – 2021-07-24 (×5): 81 mg
  Filled 2021-07-20 (×5): qty 1

## 2021-07-20 MED ORDER — HYDROCODONE-ACETAMINOPHEN 10-325 MG PO TABS: 1.0000 | ORAL_TABLET | Freq: Four times a day (QID) | ORAL | Status: DC | PRN

## 2021-07-20 MED ORDER — FENTANYL CITRATE (PF) 100 MCG/2ML IJ SOLN
100.0000 ug | Freq: Once | INTRAMUSCULAR | Status: AC
  Administered 2021-07-20: 100 ug via INTRAVENOUS

## 2021-07-20 MED ORDER — PROPOFOL 1000 MG/100ML IV EMUL
5.0000 ug/kg/min | INTRAVENOUS | Status: DC
  Administered 2021-07-20 (×2): 40 ug/kg/min via INTRAVENOUS
  Administered 2021-07-20: 45 ug/kg/min via INTRAVENOUS
  Administered 2021-07-20: 40 ug/kg/min via INTRAVENOUS
  Administered 2021-07-21 (×2): 30 ug/kg/min via INTRAVENOUS
  Administered 2021-07-21: 40 ug/kg/min via INTRAVENOUS
  Administered 2021-07-21: 35.162 ug/kg/min via INTRAVENOUS
  Administered 2021-07-21 – 2021-07-22 (×2): 40 ug/kg/min via INTRAVENOUS
  Administered 2021-07-22 (×2): 50 ug/kg/min via INTRAVENOUS
  Administered 2021-07-22: 40 ug/kg/min via INTRAVENOUS
  Administered 2021-07-22 (×3): 50 ug/kg/min via INTRAVENOUS
  Administered 2021-07-23: 45 ug/kg/min via INTRAVENOUS
  Administered 2021-07-23 (×4): 40 ug/kg/min via INTRAVENOUS
  Administered 2021-07-23: 50 ug/kg/min via INTRAVENOUS
  Administered 2021-07-24 (×2): 40 ug/kg/min via INTRAVENOUS
  Filled 2021-07-20 (×21): qty 100
  Filled 2021-07-20: qty 200

## 2021-07-20 MED ORDER — MIDAZOLAM HCL 2 MG/2ML IJ SOLN
INTRAMUSCULAR | Status: AC
  Administered 2021-07-20: 1 mg via INTRAVENOUS
  Filled 2021-07-20: qty 2

## 2021-07-20 MED ORDER — ETOMIDATE 2 MG/ML IV SOLN: 20.0000 mg | Freq: Once | INTRAVENOUS | Status: AC

## 2021-07-20 MED ORDER — FENTANYL CITRATE PF 50 MCG/ML IJ SOSY
PREFILLED_SYRINGE | INTRAMUSCULAR | Status: AC
  Administered 2021-07-20: 25 ug via INTRAVENOUS
  Filled 2021-07-20: qty 1

## 2021-07-20 MED ORDER — METOPROLOL SUCCINATE ER 25 MG PO TB24
12.5000 mg | ORAL_TABLET | Freq: Every day | ORAL | Status: DC
  Filled 2021-07-20: qty 0.5

## 2021-07-20 MED ORDER — CHLORHEXIDINE GLUCONATE 0.12% ORAL RINSE (MEDLINE KIT)
15.0000 mL | Freq: Two times a day (BID) | OROMUCOSAL | Status: DC
  Administered 2021-07-20 – 2021-07-24 (×9): 15 mL via OROMUCOSAL

## 2021-07-20 MED ORDER — INSULIN ASPART 100 UNIT/ML IJ SOLN
0.0000 [IU] | INTRAMUSCULAR | Status: DC
  Administered 2021-07-20: 3 [IU] via SUBCUTANEOUS
  Administered 2021-07-21 (×3): 5 [IU] via SUBCUTANEOUS
  Administered 2021-07-21 – 2021-07-22 (×2): 3 [IU] via SUBCUTANEOUS
  Administered 2021-07-22: 5 [IU] via SUBCUTANEOUS
  Administered 2021-07-22 (×2): 3 [IU] via SUBCUTANEOUS
  Administered 2021-07-22 (×2): 5 [IU] via SUBCUTANEOUS
  Administered 2021-07-22: 3 [IU] via SUBCUTANEOUS
  Filled 2021-07-20 (×12): qty 1

## 2021-07-20 MED ORDER — PHENYLEPHRINE HCL-NACL 20-0.9 MG/250ML-% IV SOLN
0.0000 ug/min | INTRAVENOUS | Status: DC
  Administered 2021-07-20: 50 ug/min via INTRAVENOUS
  Administered 2021-07-20: 25 ug/min via INTRAVENOUS
  Administered 2021-07-20 – 2021-07-21 (×2): 50 ug/min via INTRAVENOUS
  Administered 2021-07-21: 5 ug/min via INTRAVENOUS
  Filled 2021-07-20 (×6): qty 250

## 2021-07-20 MED ORDER — EZETIMIBE 10 MG PO TABS
10.0000 mg | ORAL_TABLET | Freq: Every day | ORAL | Status: DC
  Administered 2021-07-20 – 2021-07-24 (×5): 10 mg
  Filled 2021-07-20 (×6): qty 1

## 2021-07-20 MED ORDER — PROPOFOL 1000 MG/100ML IV EMUL
INTRAVENOUS | Status: AC
  Administered 2021-07-20: 30 ug/kg/min via INTRAVENOUS
  Filled 2021-07-20: qty 100

## 2021-07-20 MED ORDER — METHYLPREDNISOLONE SODIUM SUCC 125 MG IJ SOLR
120.0000 mg | Freq: Two times a day (BID) | INTRAMUSCULAR | Status: DC
  Administered 2021-07-21 – 2021-07-23 (×5): 120 mg via INTRAVENOUS
  Filled 2021-07-20 (×5): qty 2

## 2021-07-20 MED ORDER — PREGABALIN 25 MG PO CAPS
25.0000 mg | ORAL_CAPSULE | Freq: Three times a day (TID) | ORAL | Status: DC
  Administered 2021-07-20 – 2021-07-24 (×13): 25 mg
  Filled 2021-07-20 (×14): qty 1

## 2021-07-20 MED ORDER — METHYLPREDNISOLONE SODIUM SUCC 125 MG IJ SOLR
125.0000 mg | Freq: Once | INTRAMUSCULAR | Status: AC
  Administered 2021-07-20: 125 mg via INTRAVENOUS
  Filled 2021-07-20: qty 2

## 2021-07-20 MED ORDER — METOPROLOL TARTRATE 25 MG PO TABS
12.5000 mg | ORAL_TABLET | Freq: Two times a day (BID) | ORAL | Status: DC
Start: 2021-07-20 — End: 2021-07-25
  Administered 2021-07-20 – 2021-07-24 (×8): 12.5 mg
  Filled 2021-07-20 (×8): qty 1

## 2021-07-20 MED ORDER — POLYETHYLENE GLYCOL 3350 17 G PO PACK
17.0000 g | PACK | Freq: Every day | ORAL | Status: DC
  Administered 2021-07-23: 17 g
  Filled 2021-07-20 (×2): qty 1

## 2021-07-20 MED ORDER — DEXTROSE 5 % IV SOLN: INTRAVENOUS | Status: DC

## 2021-07-20 MED ORDER — ETOMIDATE 2 MG/ML IV SOLN
INTRAVENOUS | Status: AC
  Administered 2021-07-20: 20 mg
  Filled 2021-07-20: qty 20

## 2021-07-20 MED ORDER — LINEZOLID 600 MG/300ML IV SOLN
600.0000 mg | Freq: Two times a day (BID) | INTRAVENOUS | Status: DC
  Administered 2021-07-20 – 2021-07-24 (×8): 600 mg via INTRAVENOUS
  Filled 2021-07-20 (×10): qty 300

## 2021-07-20 MED ORDER — METHIMAZOLE 10 MG PO TABS
10.0000 mg | ORAL_TABLET | Freq: Two times a day (BID) | ORAL | Status: DC
  Administered 2021-07-20: 10 mg
  Filled 2021-07-20: qty 1

## 2021-07-20 MED ORDER — PANTOPRAZOLE 2 MG/ML SUSPENSION
40.0000 mg | Freq: Every day | ORAL | Status: DC
  Administered 2021-07-20 – 2021-07-24 (×5): 40 mg
  Filled 2021-07-20 (×5): qty 20

## 2021-07-20 MED ORDER — DEXTROSE 50 % IV SOLN: 25.0000 g | INTRAVENOUS | Status: AC

## 2021-07-20 NOTE — Progress Notes (Signed)
   07/20/21 0600  Clinical Encounter Type  Visited With Family  Visit Type Initial  Referral From Nurse  Consult/Referral To Chaplain   Chaplain responded to nurse page. Chaplain provided support for daughter in ICU waiting room while patient was being intubated. Chaplain provided a compassionate presence and reflective listening as daughter spoke about father illness. Chaplain provided prayer as requested. Daughter appreciated Chaplain visit. Chaplain is available as needed for follow up.

## 2021-07-20 NOTE — Consult Note (Signed)
PHARMACY CONSULT NOTE  Pharmacy Consult for Electrolyte Monitoring and Replacement   Recent Labs: Potassium (mmol/L)  Date Value  07/20/2021 4.6   Magnesium (mg/dL)  Date Value  34/19/3790 1.9   Calcium (mg/dL)  Date Value  24/10/7351 8.2 (L)   Albumin (g/dL)  Date Value  29/92/4268 2.3 (L)   Phosphorus (mg/dL)  Date Value  34/19/6222 4.7 (H)   Sodium (mmol/L)  Date Value  07/20/2021 137   Assessment: Patient is a 76 y/o M with medical history including asthma, DM, HTN, hypothyroidism, Afib, CAD s/p CABG on 07/14/21, CKD, CVA, NSTEMI, HFpEF who is admitted with respiratory failure in setting of pneumonia ultimately requiring intubation. Patient is currently admitted to the ICU where he is intubated, sedated, and on mechanical ventilation.   Goal of Therapy:  Electrolytes within normal limits  Plan:  --No electrolyte replacement indicated at this time --Follow-up electrolytes with AM labs tomorrow  Tressie Ellis 07/20/2021 8:32 AM

## 2021-07-20 NOTE — Progress Notes (Signed)
ABG obtained at LR, pressure held 10 mins. PT off 6L [place] and placed on BIPAP 14/6 50%, RR 33, HR 118, SpO2 100%. WOB improving.

## 2021-07-20 NOTE — Consult Note (Signed)
[place] NOTE       Patient ID: ALMOND FRIES MRN: [id number] DOB/AGE: [birth year] 76 y.o.  Admit date: 07/09/2021 Referring Physician Dr. Irene Pap  Primary Physician [facility] primary care  Primary Cardiologist Dr. Clayborn Bigness Reason for Consultation acute on chronic HFpEF, s/p CABG 06/27/21  HPI: Jaterrius Gerlitz is a 76 year old male with a past medical history of NSTEMI s/p recent CABG x3 (SVG-OM-diagonal, LIMA-LAD) and left atrial appendage ligation (0000000, Dr. Silvio Pate), HFpEF (LVEF 55-60%, w/o WMA's  moderate MR), paroxysmal atrial fibrillation/flutter s/p ablation (2012, previously on flecainide, recently started on amiodarone), bilateral carotid artery stenosis, hypertension, hyperlipidemia, type 2 diabetes, hyperthyroidism on MMI, CKD 3, COPD, CVA, chronic pain who presented to [facility] ED from short-term rehab on 06/26/2021 with shortness of breath and productive cough that has been worsening over the past week failing outpatient antibiotic treatment for presumed pneumonia that was prescribed at rehab.  He was admitted to stepdown on BiPAP, but decompensated and required intubation overnight. Was in A-fib with RVR, now in sinus. Hypotensive, started on phenylephrine. Cardiology is consulted for assistance with his heart failure.   The patient was recently hospitalized at [facility] from 4/27 - 06/25/2021 and had an atypical NSTEMI presentation after taking bowel prep for an elective colonoscopy.  His troponin was 7000 and underwent heart catheterization that showed severe two-vessel coronary disease for which transfer to [facility] was recommended for CABG.  He had CABG x3 with left atrial appendage ligation performed on 06/27/2021 and was discharged to short-term rehab on 07/09/21 after a hospital stay complicated by A-fib with RVR with concern for flecainide toxicity (widening QRS postoperatively after uptitration), seen by EP who recommended amiodarone for rhythm control and metoprolol.  He was  discharged to rehab and was doing well for the first week and getting his strength back per his family at bedside.  He was seen by in his CT surgeon's office on 5/22 where chest x-ray showed low lung volumes with diffuse heterogenous bilateral opacities representing edema or infection, without pleural effusion or pneumothorax.  He was started on doxycycline and prednisone at his rehab facility for presumed pneumonia but remains short of breath and with an increased oxygen at baseline) his family states he became more short of breath over the past few days and has a productive cough with thick brown sputum with decreased appetite.  Possible low grade fever per wife.  They say he didn't complain of any chest pain just very deconditioned and weak.  Currently on phenylephrine 50 mcg with blood pressure in the high 90s/low 100s.  Was initially in A-fib with RVR with rates in the 160s, improved to the 60s and back in sinus rhythm after intubation.  He was given a total of 80 mg of IV Lasix overnight and started on broad-spectrum antibiotics.  Labs on admission notable for potassium 4.6, BUN/creatinine 30/2.06, GFR 33 (baseline between 1.5-1.8, GFR 40-45), BNP trending 152.9-94.  High-sensitivity troponin elevated but flat trending 44-46-54.  Leukocytosis 12-16 0.7, H&H stable 8.7/29.3.  Platelets 467.  PT/INR 30.9/3.0.  Lactate within normal limits, procalcitonin 0.30.   Review of systems complete and found to be negative unless listed above     Past Medical History:  Diagnosis Date   A-fib ([place])    Abnormal heart rhythm    Arthritis    Asthma    CAP (community acquired pneumonia) 04/22/2016   Diabetes mellitus without complication ([place])    Dysrhythmia    afib treated with flecainide  GERD (gastroesophageal reflux disease)    Hemoptysis 04/22/2016   Hypercholesteremia    Hypertension    Hyperthyroidism    Kidney stones    Sepsis ([place]) 04/22/2016    Past Surgical History:  Procedure Laterality  Date   CATARACT EXTRACTION W/PHACO Left 09/26/2016   Procedure: CATARACT EXTRACTION PHACO AND INTRAOCULAR LENS PLACEMENT ([place]);  Surgeon: Eulogio Bear, MD;  Location: [facility] ORS;  Service: Ophthalmology;  Laterality: Left;  [place] 00:52.0 AP% 17.0 CDE 8.86 Fluid pack lot # [id number]   EYE SURGERY Right 2015   cataract   JOINT REPLACEMENT Right    knee   KNEE SURGERY Right 2015   total knee replacement revision   LEFT HEART CATH AND CORONARY ANGIOGRAPHY N/A 06/25/2021   Procedure: LEFT HEART CATH AND CORONARY ANGIOGRAPHY;  Surgeon: Isaias Cowman, MD;  Location: [facility];  Service: Cardiovascular;  Laterality: N/A;    Medications Prior to Admission  Medication Sig Dispense Refill Last Dose   albuterol (VENTOLIN HFA) 108 (90 Base) MCG/ACT inhaler Inhale into the lungs every 6 (six) hours as needed for wheezing or shortness of breath.      amiodarone (PACERONE) 200 MG tablet Take 200 mg by mouth daily.      aspirin EC 81 MG EC tablet Take 1 tablet (81 mg total) by mouth daily. Swallow whole. 30 tablet 11    budesonide-formoterol (SYMBICORT) 160-4.5 MCG/ACT inhaler Inhale 2 puffs into the lungs 2 (two) times daily.      doxycycline (VIBRA-TABS) 100 MG tablet Take 100 mg by mouth 2 (two) times daily.      escitalopram (LEXAPRO) 20 MG tablet Take 20 mg by mouth daily.      ezetimibe (ZETIA) 10 MG tablet Take 10 mg by mouth daily.      furosemide (LASIX) 40 MG tablet Take 40 mg by mouth.      glipiZIDE (GLUCOTROL XL) 10 MG 24 hr tablet Take 10 mg by mouth daily with breakfast.      HYDROcodone-acetaminophen (NORCO) 10-325 MG tablet Take 1 tablet by mouth every 6 (six) hours as needed.      insulin glargine (LANTUS) 100 UNIT/ML injection Inject 20 Units into the skin daily.      metoprolol succinate (TOPROL-XL) 50 MG 24 hr tablet Take 150 mg by mouth daily. Take with or immediately following a meal.      Multiple Vitamins-Minerals (MULTIVITAMIN WITH MINERALS) tablet Take 1 tablet by  mouth daily.      nystatin (MYCOSTATIN/NYSTOP) powder Apply 1 application. topically daily. Apply to groin topically every day and evening for rash      omeprazole (PRILOSEC) 20 MG capsule Take 20 mg by mouth 2 (two) times daily before a meal.      polyethylene glycol powder (GLYCOLAX/MIRALAX) 17 GM/SCOOP powder Take 17 g by mouth daily.      potassium chloride SA (KLOR-CON M) 20 MEQ tablet Take 20 mEq by mouth daily.      predniSONE (DELTASONE) 20 MG tablet Take 20 mg by mouth in the morning and at bedtime.      pregabalin (LYRICA) 25 MG capsule Take 1 capsule (25 mg total) by mouth 3 (three) times daily.      sennosides-docusate sodium (SENOKOT-S) 8.6-50 MG tablet Take 2 tablets by mouth in the morning and at bedtime.      traZODone (DESYREL) 50 MG tablet Take 150 mg by mouth at bedtime as needed for sleep.      warfarin (COUMADIN) 3 MG  tablet Take 3 mg by mouth daily. Take 1.5mg  once daily on Tuesdays,Thursdays,Sundays, and Saturdays Take 3mg  once daily on Mondays, Wednesdays, and Fridays      Ampicillin-Sulbactam 3 g in sodium chloride 0.9 % 100 mL Inject 3 g into the vein every 6 (six) hours.      methimazole (TAPAZOLE) 10 MG tablet Take 1 tablet (10 mg total) by mouth 2 (two) times daily. (Patient taking differently: Take 10 mg by mouth daily.)      metoprolol succinate (TOPROL-XL) 25 MG 24 hr tablet Take 1 tablet (25 mg total) by mouth daily. (Patient not taking: Reported on 07/03/2021)   Not Taking   mometasone-formoterol (DULERA) 200-5 MCG/ACT AERO Inhale 2 puffs into the lungs 2 (two) times daily. 1 each     pantoprazole (PROTONIX) 40 MG injection Inject 40 mg into the vein every 12 (twelve) hours. 1 each     Social History   Socioeconomic History   Marital status: Married    Spouse name: Not on file   Number of children: Not on file   Years of education: Not on file   Highest education level: Not on file  Occupational History   Not on file  Tobacco Use   Smoking status: Never    Smokeless tobacco: Current   Tobacco comments:    uses dip a little  Vaping Use   Vaping Use: Never used  Substance and Sexual Activity   Alcohol use: No    Alcohol/week: 0.0 standard drinks   Drug use: No   Sexual activity: Not on file  Other Topics Concern   Not on file  Social History Narrative   Not on file   Social Determinants of Health   Financial Resource Strain: Not on file  Food Insecurity: Not on file  Transportation Needs: Not on file  Physical Activity: Not on file  Stress: Not on file  Social Connections: Not on file  Intimate Partner Violence: Not on file    Family History  Problem Relation Age of Onset   Dementia Mother    Heart disease Father       PHYSICAL EXAM General: Elderly and critically ill appearing Caucasian male, intubated with mouth open, wife and 3 daughters at bedside.   HEENT:  Normocephalic and atraumatic. Neck:  No JVD.  Lungs: Coarse breath sounds bilaterally, diminished at the bases. Chest: Gauze covering median sternotomy incision, Holter monitor left chest Heart: HRRR . Normal S1 and S2 without gallops or murmurs.  Abdomen: Mildly distended appearing appearing.  Msk: Normal strength and tone for age. Extremities: Warm and well perfused. No clubbing, cyanosis.  No lower extremity edema.  SCDs bilaterally Neuro: Intubated, sedated Psych: Intubated, sedated  Labs:   Lab Results  Component Value Date   WBC 16.7 (H) 07/20/2021   HGB 8.7 (L) 07/20/2021   HCT 29.3 (L) 07/20/2021   MCV 88.5 07/20/2021   PLT 467 (H) 07/20/2021    Recent Labs  Lab 07/20/21 0610  NA 137  K 4.6  CL 105  CO2 23  BUN 30*  CREATININE 2.06*  CALCIUM 8.2*  PROT 7.7  BILITOT 1.6*  ALKPHOS 129*  ALT 54*  AST 126*  GLUCOSE 104*   Lab Results  Component Value Date   TROPONINI <0.03 05/18/2016   No results found for: CHOL No results found for: HDL No results found for: LDLCALC No results found for: TRIG No results found for: CHOLHDL No  results found for: LDLDIRECT    Radiology:  DG Chest 2 View  Result Date: 06/21/2021 CLINICAL DATA:  Weakness EXAM: CHEST - 2 VIEW COMPARISON:  12/18/2016 FINDINGS: Moderate enlargement of the cardiopericardial silhouette with indistinct airspace opacities primarily at the lung bases and peripherally in the left mid lung, with associated obscuration of the hemidiaphragms. Faint Kerley B lines seen in the right upper lobe, bilateral Awanda Mink a lines are present. Potential blunting of the left lateral costophrenic angle. Chronic wedging of upper thoracic vertebra, no change from 05/18/2016. Low lung volumes are present, causing crowding of the pulmonary vasculature. Old right posterolateral rib deformities. IMPRESSION: 1. Moderate enlargement of the cardiopericardial silhouette with bibasilar airspace opacities and Kerley B lines. The airspace opacities could be a manifestation of pulmonary edema, although aspiration or multilobar pneumonia is not excluded. 2. Indistinct left costophrenic angle laterally, cannot exclude small left pleural effusion. Electronically Signed   By: Van Clines M.D.   On: 06/21/2021 09:41   DG Abd 1 View  Result Date: 07/20/2021 CLINICAL DATA:  OG tube placement. EXAM: ABDOMEN - 1 VIEW COMPARISON:  01/28/2020 FINDINGS: OG tube tip is in the distal stomach. Gaseous distension of: Evident with some mildly distended gas-filled small bowel loops in the upper pelvis. IMPRESSION: OG tube tip is in the distal stomach. Electronically Signed   By: Misty Stanley M.D.   On: 07/20/2021 06:00   CT Head Wo Contrast  Result Date: 06/21/2021 CLINICAL DATA:  Mental status change EXAM: CT HEAD WITHOUT CONTRAST TECHNIQUE: Contiguous axial images were obtained from the base of the skull through the vertex without intravenous contrast. RADIATION DOSE REDUCTION: This exam was performed according to the departmental dose-optimization program which includes automated exposure control, adjustment of  the mA and/or kV according to patient size and/or use of iterative reconstruction technique. COMPARISON:  None. FINDINGS: Brain: No evidence of acute infarction, hemorrhage, extra-axial collection, ventriculomegaly, or mass effect. Generalized cerebral atrophy. Vascular: Cerebrovascular atherosclerotic calcifications are noted. Skull: Negative for fracture or focal lesion. Sinuses/Orbits: Visualized portions of the orbits are unremarkable. Visualized portions of the paranasal sinuses are unremarkable. Visualized portions of the mastoid air cells are unremarkable. Other: None. IMPRESSION: 1. No acute intracranial abnormality. 2. Generalized cerebral atrophy. Electronically Signed   By: Kathreen Devoid M.D.   On: 06/21/2021 09:27   CT Angio Chest PE W/Cm &/Or Wo Cm  Result Date: 06/21/2021 CLINICAL DATA:  Weakness and shortness of breath. EXAM: CT ANGIOGRAPHY CHEST WITH CONTRAST TECHNIQUE: Multidetector CT imaging of the chest was performed using the standard protocol during bolus administration of intravenous contrast. Multiplanar CT image reconstructions and MIPs were obtained to evaluate the vascular anatomy. RADIATION DOSE REDUCTION: This exam was performed according to the departmental dose-optimization program which includes automated exposure control, adjustment of the mA and/or kV according to patient size and/or use of iterative reconstruction technique. CONTRAST:  64mL OMNIPAQUE IOHEXOL 350 MG/ML SOLN COMPARISON:  05/18/2016 FINDINGS: Cardiovascular: The heart is mildly enlarged. No pericardial effusion. There is stable tortuosity, ectasia and calcification of the thoracic aorta but no dissection or aortic aneurysm. The branch vessels are patent. Stable three-vessel coronary artery calcifications. The pulmonary arterial tree is suboptimally opacified but no large central pulmonary emboli are identified. Mediastinum/Nodes: Mediastinal and hilar lymphadenopathy similar to prior CT scan from 2018 suggesting  chronic reactive process. Sarcoidosis would be another possibility. Some of the nodes are calcified. The esophagus is grossly. Lungs/Pleura: Chronic underlying interstitial lung disease with superimposed lower lobe predominant extensive airspace process. It is possible this is asymmetric pulmonary edema  but it is more concerning for multifocal pneumonia or aspiration. No pleural effusions. No discrete worrisome pulmonary lesions. Upper Abdomen: No significant upper abdominal findings. Cholelithiasis is noted. Stable vascular calcifications. Musculoskeletal: No chest wall mass, supraclavicular or axillary adenopathy. The thyroid gland is unremarkable. The bony thorax is intact. The left remote upper thoracic compression fractures. Review of the MIP images confirms the above findings. IMPRESSION: 1. Suboptimal study but no CT findings for large central pulmonary emboli. 2. Stable tortuosity, ectasia and calcification of the thoracic aorta but no dissection or aortic aneurysm. 3. Stable three-vessel coronary artery calcifications. 4. Chronic underlying interstitial lung disease with superimposed lower lobe predominant extensive airspace process, more concerning for multifocal pneumonia or aspiration. 5. Stable mediastinal and hilar lymphadenopathy. 6. Cholelithiasis. Aortic Atherosclerosis (ICD10-I70.0) and Emphysema (ICD10-J43.9). Electronically Signed   By: Marijo Sanes M.D.   On: 06/21/2021 10:59   MR BRAIN WO CONTRAST  Result Date: 06/21/2021 CLINICAL DATA:  Acute neuro deficit. EXAM: MRI HEAD WITHOUT CONTRAST TECHNIQUE: Multiplanar, multiecho pulse sequences of the brain and surrounding structures were obtained without intravenous contrast. COMPARISON:  CT head 06/21/2021 FINDINGS: Brain: Negative for acute infarct. Ventricle size and cerebral volume within normal limits. Negative for hemorrhage or mass Chronic infarct left occipital parietal lobe. Mild white matter changes bilaterally. Vascular: Normal  arterial flow voids at the skull base Skull and upper cervical spine: No focal skull lesion. Sinuses/Orbits: Mild mucosal edema paranasal sinuses. Bilateral cataract extraction Other: None IMPRESSION: Negative for acute infarct Chronic infarct left occipital parietal lobe. Electronically Signed   By: Franchot Gallo M.D.   On: 06/21/2021 15:56   CARDIAC CATHETERIZATION  Result Date: 06/25/2021   Prox RCA lesion is 40% stenosed.   RPDA lesion is 70% stenosed.   Ost Cx to Prox Cx lesion is 75% stenosed.   1st Mrg lesion is 70% stenosed.   Prox LAD lesion is 95% stenosed.   Ost LM to Mid LM lesion is 60% stenosed.   There is mild left ventricular systolic dysfunction.   The left ventricular ejection fraction is 45-50% by visual estimate. 1.  Non-STEMI 2.  Severe two-vessel coronary artery disease with complex 95% calcified stenosis proximal LAD involving takeoff of large D1, 60% stenosis left main, 75% stenosis ostial left circumflex 3.  Preserved left ventricular function Recommendations Transfer to [facility] for CABG   DG Chest Port 1 View  Result Date: 07/20/2021 CLINICAL DATA:  Endotracheal tube adjustment EXAM: PORTABLE CHEST 1 VIEW COMPARISON:  07/20/2021 FINDINGS: Endotracheal tube is approximately 6 cm above the carina. Enteric tube is present. Temperature probe is present. Persistent right greater than left pulmonary opacities. Aeration is slightly improved. No significant pleural effusion. No pneumothorax. Stable cardiomediastinal contours. IMPRESSION: Lines and tubes as above. Persistent right greater than left pulmonary opacities with slight improvement in aeration. Electronically Signed   By: Macy Mis M.D.   On: 07/20/2021 08:24   DG Chest Port 1 View  Result Date: 07/20/2021 CLINICAL DATA:  Status post intubation. EXAM: PORTABLE CHEST 1 VIEW COMPARISON:  07/05/2021 FINDINGS: 0543 hours. Endotracheal tube tip is approximately 4.1 cm above the base of the carina. The NG tube passes into the  stomach although the distal tip position is not included on the film. Esophageal temperature probe evident. Similar low volume film. The cardio pericardial silhouette is enlarged. Diffuse airspace disease in the right lung is similar to prior as is the patchy left upper lobe airspace opacity and more consolidative disease in the left lower lung.  No substantial pleural effusion. Telemetry leads overlie the chest. IMPRESSION: 1. Endotracheal tube tip is approximately 4.1 cm above the base of the carina. 2. Persistent bilateral airspace disease, left greater than right and not substantially changed. Electronically Signed   By: Misty Stanley M.D.   On: 07/20/2021 06:15   DG Chest Port 1 View  Result Date: 07/24/2021 CLINICAL DATA:  Question sepsis EXAM: PORTABLE CHEST 1 VIEW COMPARISON:  06/21/2021 FINDINGS: Previous median sternotomy, CABG and atrial clip. Worsening low pulmonary density throughout the right lung and in the left lower lung suggesting pneumonia. Asymmetric edema could have a similar pattern. No dense consolidation or lobar collapse. No visible effusion. IMPRESSION: Increasing pulmonary opacity throughout the right lung and in the left lower lobe that could represent developing pneumonia or asymmetric edema. Electronically Signed   By: Nelson Chimes M.D.   On: 07/15/2021 17:59   ECHOCARDIOGRAM COMPLETE  Result Date: 06/23/2021    ECHOCARDIOGRAM REPORT   Patient Name:   KOLTEN PAPPA Date of Exam: 06/22/2021 Medical Rec #:  [id number]     Height:       74.0 in Accession #:    [id number]    Weight:       200.0 lb Date of Birth:  [birth year]     BSA:          2.173 m Patient Age:    62 years      BP:           119/62 mmHg Patient Gender: M             HR:           71 bpm. Exam Location:  [facility] Procedure: 2D Echo, Cardiac Doppler and Color Doppler Indications:     NSTEMI I21.4  History:         Patient has prior history of Echocardiogram examinations, most                  recent 04/23/2016.  Arrythmias:Atrial Fibrillation; Risk                  Factors:Diabetes and Hypertension.  Sonographer:     Sherrie Sport Referring Phys:  [id number] Henderson Suanne Minahan Diagnosing Phys: Isaias Cowman MD IMPRESSIONS  1. Left ventricular ejection fraction, by estimation, is 55 to 60%. The left ventricle has normal function. The left ventricle has no regional wall motion abnormalities. Left ventricular diastolic parameters were normal.  2. Right ventricular systolic function is normal. The right ventricular size is normal.  3. The mitral valve is normal in structure. Moderate mitral valve regurgitation. No evidence of mitral stenosis.  4. The aortic valve is normal in structure. Aortic valve regurgitation is mild. Mild aortic valve stenosis.  5. The inferior vena cava is normal in size with greater than 50% respiratory variability, suggesting right atrial pressure of 3 mmHg. FINDINGS  Left Ventricle: Left ventricular ejection fraction, by estimation, is 55 to 60%. The left ventricle has normal function. The left ventricle has no regional wall motion abnormalities. The left ventricular internal cavity size was normal in size. There is  no left ventricular hypertrophy. Left ventricular diastolic parameters were normal. Right Ventricle: The right ventricular size is normal. No increase in right ventricular wall thickness. Right ventricular systolic function is normal. Left Atrium: Left atrial size was normal in size. Right Atrium: Right atrial size was normal in size. Pericardium: There is no evidence of pericardial effusion. Mitral Valve: The mitral valve is normal  in structure. Moderate mitral valve regurgitation. No evidence of mitral valve stenosis. MV peak gradient, 5.6 mmHg. The mean mitral valve gradient is 3.0 mmHg. Tricuspid Valve: The tricuspid valve is normal in structure. Tricuspid valve regurgitation is mild . No evidence of tricuspid stenosis. Aortic Valve: The aortic valve is normal in structure. Aortic  valve regurgitation is mild. Mild aortic stenosis is present. Aortic valve mean gradient measures 7.7 mmHg. Aortic valve peak gradient measures 12.4 mmHg. Aortic valve area, by VTI measures 2.04 cm. Pulmonic Valve: The pulmonic valve was normal in structure. Pulmonic valve regurgitation is not visualized. No evidence of pulmonic stenosis. Aorta: The aortic root is normal in size and structure. Venous: The inferior vena cava is normal in size with greater than 50% respiratory variability, suggesting right atrial pressure of 3 mmHg. IAS/Shunts: No atrial level shunt detected by color flow Doppler.  LEFT VENTRICLE PLAX 2D LVIDd:         4.60 cm      Diastology LVIDs:         2.90 cm      LV e' medial:    6.31 cm/s LV PW:         1.00 cm      LV E/e' medial:  17.4 LV IVS:        1.20 cm      LV e' lateral:   7.40 cm/s LVOT diam:     2.00 cm      LV E/e' lateral: 14.9 LV SV:         63 LV SV Index:   29 LVOT Area:     3.14 cm  LV Volumes (MOD) LV vol d, MOD A2C: 102.0 ml LV vol d, MOD A4C: 161.0 ml LV vol s, MOD A2C: 63.5 ml LV vol s, MOD A4C: 91.7 ml LV SV MOD A2C:     38.5 ml LV SV MOD A4C:     161.0 ml LV SV MOD BP:      54.8 ml RIGHT VENTRICLE RV S prime:     14.50 cm/s TAPSE (M-mode): 3.6 cm LEFT ATRIUM              Index        RIGHT ATRIUM           Index LA diam:        5.00 cm  2.30 cm/m   RA Area:     28.30 cm LA Vol (A2C):   84.2 ml  38.74 ml/m  RA Volume:   89.80 ml  41.32 ml/m LA Vol (A4C):   100.0 ml 46.01 ml/m LA Biplane Vol: 92.1 ml  42.38 ml/m  AORTIC VALVE                     PULMONIC VALVE AV Area (Vmax):    1.80 cm      PV Vmax:        0.96 m/s AV Area (Vmean):   1.67 cm      PV Vmean:       70.800 cm/s AV Area (VTI):     2.04 cm      PV VTI:         0.173 m AV Vmax:           176.33 cm/s   PV Peak grad:   3.7 mmHg AV Vmean:          124.233 cm/s  PV Mean grad:   2.0 mmHg AV VTI:  0.307 m       RVOT Peak grad: 3 mmHg AV Peak Grad:      12.4 mmHg AV Mean Grad:      7.7 mmHg LVOT  Vmax:         101.00 cm/s LVOT Vmean:        66.200 cm/s LVOT VTI:          0.200 m LVOT/AV VTI ratio: 0.65  AORTA Ao Root diam: 3.50 cm MITRAL VALVE                TRICUSPID VALVE MV Area (PHT): 3.43 cm     TR Peak grad:   32.7 mmHg MV Area VTI:   1.96 cm     TR Vmax:        286.00 cm/s MV Peak grad:  5.6 mmHg MV Mean grad:  3.0 mmHg     SHUNTS MV Vmax:       1.18 m/s     Systemic VTI:  0.20 m MV Vmean:      83.7 cm/s    Systemic Diam: 2.00 cm MV Decel Time: 221 msec     Pulmonic VTI:  0.168 m MV E velocity: 110.00 cm/s MV A velocity: 98.50 cm/s MV E/A ratio:  1.12 Isaias Cowman MD Electronically signed by Isaias Cowman MD Signature Date/Time: 06/23/2021/1:23:11 PM    Final      TELEMETRY reviewed by me: Initially A-fib with RVR with rates 140s-160s, converted to sinus rhythm postintubation with rates in the 60s.  EKG reviewed by me: initially sinus rhythm LAFB, AF RVR rate 140s  ASSESSMENT AND PLAN:  Wayland Nudelman is a 76 year old male with a past medical history of NSTEMI s/p recent CABG x3 (SVG-OM-diagonal, LIMA-LAD) and left atrial appendage ligation (0000000, Dr. Silvio Pate), HFpEF (0000000 LVEF 55-60%, w/o WMA's  moderate MR), paroxysmal atrial fibrillation/flutter s/p ablation (2012, previously on flecainide, recently started on amiodarone), bilateral carotid artery stenosis, hypertension, hyperlipidemia, type 2 diabetes, hyperthyroidism on MMI, CKD 3, COPD, CVA, chronic pain who presented to [facility] ED from short-term rehab on 07/07/2021 with shortness of breath and productive cough that has been worsening over the past week failing outpatient antibiotic treatment for presumed pneumonia that was prescribed at rehab.  He was admitted to stepdown on BiPAP, but decompensated and required intubation overnight. Was in A-fib with RVR, now in sinus. Hypotensive, started on phenylephrine. Cardiology is consulted for assistance with his heart failure.   #Acute hypoxic respiratory failure 2/2 suspected  pneumonia  #Acute on chronic HFpEF (LVEF 55-60%, moderate MR) The patient is ~3 weeks postop CABG x3 with a 2-week hospital stay coming from rehab facility.  His family notes he initially was feeling well and stronger for the first week of rehab but noted the past 4 days he became more short of breath.  He was prescribed antibiotics at his rehab facility for presumed pneumonia, but continued to require oxygen and have a productive cough, feeling weak.  He has leukocytosis to 17 but lactate is normal.  BNP is elevated to 1000 and chest x-ray shows significant opacity in the right lung throughout and left lower lung concerning for pneumonia or edema, but not significantly clinically volume overloaded appearing -Agree with current therapy per PCCM -Continue vasopressor support, wean as tolerated -S/p a total of 80 mg IV Lasix since admission. -agree with lasix 20mg  IV Lasix BID with close monitoring of renal function -Repeat echocardiogram complete, further recommendations based on results  #CAD s/p CABG x 3  on [id number] (Dr. Silvio Pate, [facility]) #Elevated troponin likely demand Prolonged hospital stay postoperatively complicated by AF with RVR significant deconditioning, was discharged to short-term rehab.  Troponin minimally elevated and flat trending 44-46-54, likely demand in the setting of respiratory distress and initial rapid heart rate. -Continue aspirin 81 mg daily -Continue Zetia, previously reported intolerance to statins, unclear of which statin he took. Consider retrial.  #Paroxysmal atrial fibrillation with RVR Came in with A-fib RVR rates in the 160s, rate improved and converted to sinus rhythm in the 60s-70s after intubation. -Was previously on flecainide, discontinued after concern for toxicity with QRS widening during his admission at [facility] and was started on amiodarone 200 mg once a day. -ok with holding amiodarone for now -Continue metoprolol tartrate 12.5 twice daily  #CKD 3 baseline  between 1.5-1.8, GFR 40-45 Currently 2.06, 33 Daily BMP  This patient's plan of care was discussed and created with Dr. Donnelly Angelica and he is in agreement.  Signed: Tristan Schroeder , PA-C 07/20/2021, 10:47 AM [facility]

## 2021-07-20 NOTE — ED Notes (Signed)
Paged MD about critical CBG.

## 2021-07-20 NOTE — Consult Note (Signed)
NAME:  Alan Stafford, MRN:  [id number], DOB:  06/24/1945, LOS: 1 ADMISSION DATE:  07/12/2021, CONSULTATION DATE:  07/20/21 REFERRING MD:  Dr. Nevada Crane, CHIEF COMPLAINT:  Shortness of breath   History of Present Illness:  76 year old male presenting to [facility] ED from rehab via EMS due to worsening shortness of breath, productive cough, acute oxygen requirement of 3.5 L nasal cannula that has been steadily increasing since 07/16/2021 and fatigue in the setting of recent pneumonia diagnosis.  Patient was prescribed antibiotics and prednisone in rehab for presumed pneumonia. Of note patient was recently discharged to rehab from the [facility] post CABG 06/27/2021.  Per daughter bedside she thinks her mom noted a low-grade fever when he was at the facility but no other complaints that she is aware of.  ED course: Vital signs stable on arrival, however patient remains on acute oxygen.  Imaging revealed worsening pulmonary opacities particularly in the right lung.  Lab work revealed elevated BNP with demand ischemia as troponin elevated but flat in the 40s.  Leukocytosis, however lactic WNL. Due to concern for worsening pneumonia while on antibiotics, TRH consulted for admission to inpatient. medications given: Cefepime, Lasix 20 mg, vancomycin Initial Vitals: 99 F, mildly tachypneic at 22, NSR 74, BP 119/67 and SPO2 96% on 4 L nasal cannula Significant labs: (Labs/ Imaging personally reviewed) I, Domingo Pulse Rust-[place], AGACNP-BC, personally viewed and interpreted this ECG. EKG Interpretation: Date: 07/06/2021, EKG Time: 17:09, Rate: 78, Rhythm: NSR, QRS Axis: LAD, Intervals: LAFB, ST/T Wave abnormalities: None, Narrative Interpretation: NSR with LAD & LAFB Chemistry: Na+: 134, K+: 4.3, BUN/Cr.:  27/1.71, Serum CO2/ AG: 22/9, AST: 84 Hematology: WBC: 12.3, Hgb: 8.5,  Troponin: 44> 46, BNP: 752.9, Lactic: 1.2 > 0.9, COVID-19 & Influenza A/B: Negative ABG: 7.42/38/57/24.6 CXR 07/24/2021: Increasing pulmonary opacity  throughout the right lung and in the left lower lobe that could represent developing pneumonia or asymmetric edema  Prior to moving inpatient patient's respiratory status declined, he had increased work of breathing somnolent but arousable to voice as and was placed on BiPAP support.  ABG reassuring, therefore transferred to stepdown unit on BiPAP.  TRH urgently consulted cardiology for assistance in management. PCCM consulted for assistance in management and monitoring due to worsening respiratory status with high risk for intubation. Upon arrival to ICU patient tachypneic, diaphoretic with increased work of breathing and A-fib RVR with heart rate sustaining in the 150s.  Patient also responsive but lethargic.  Daughter brought bedside explaining decision to emergently intubate and place patient on mechanical ventilatory support.   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  Upon bedside assessment patient acutely lethargic with increased work of breathing tachypnea and A-fib RVR-decision made to emergently intubate and place the patient on mechanical ventilatory support.  Interim History / Subjective:  Upon arrival to ICU patient tachypneic, diaphoretic with increased work of breathing and A-fib RVR with heart rate sustaining in the 150s.  Patient also responsive but lethargic.  Daughter brought bedside explaining decision to emergently intubate and place patient on mechanical ventilatory support.  Objective   Blood pressure 121/81, pulse (!) 118, temperature 99.1 F (37.3 C), temperature source Oral, resp. rate (!) 33, height  6\' 2"  (1.88 m), weight 94.8 kg, SpO2 100 %.    FiO2 (%):  [50 %] 50 %   Intake/Output Summary (Last 24 hours) at 07/20/2021 0542 Last data filed at 07/21/2021 2226 Gross per 24 hour  Intake 145.82 ml  Output --  Net 145.82 ml   Filed Weights   06/30/2021 1714  Weight: 94.8 kg    Examination: General: Adult male, diaphoretic critically, lying in bed in respiratory distress HEENT: MM pink/moist, anicteric, atraumatic, neck supple Neuro: RASS: -1, able to follow intermittent commands, PERRL +3, MAE CV: s1s2 irregular, tachycardic, A-Fib RVR in 170's on monitor, no r/m/g Pulm: Regular, tachypneic & labored on BIPAP, breath sounds coarse crackles RUL, coarse- LUL, diminished with crackles-BLL GI: soft, rounded, non tender, bs x 4 GU: foley in place with clear yellow urine Skin: DTI Right calf, blanchable redness on sacrum Extremities: warm/dry, pulses + 2 R/P, no edema noted  Resolved Hospital Problem list     Assessment & Plan:  Acute Hypoxic Respiratory Failure secondary to HCAP and suspected pulmonary edema PMHx: Asthma - Ventilator settings: PRVC  8 mL/kg, 60% FiO2, 5 PEEP, continue ventilator support & lung protective strategies - Wean PEEP & FiO2 as tolerated, maintain SpO2 > 90% - Head of bed elevated 30 degrees, VAP protocol in place - Plateau pressures less than 30 cm H20  - Intermittent chest x-ray & ABG PRN - Daily WUA with SBT as tolerated  - Ensure adequate pulmonary hygiene  - F/u cultures, trend PCT - Continue HCAP coverage cefepime & linezolid - Bronchodilators PRN - PAD protocol in place: continue Fentanyl IVP & Propofol drip -Initiate peripheral phenylephrine drip in the setting of sedatives, wean as tolerated to maintain MAP > 65  Elevated BNP, suspect acute HFrEF exacerbation CAD status post recent CABG (06/2021) Atrial fibrillation with RVR Recent echo 06/14/2021 showed preserved LVEF and normal diastolic function.  A-fib RVR reverted to sinus rhythm post  intubation and airway support. -Consider restarting outpatient amiodarone, per cardiology recommendation (stopped by [facility] due to concerns for ARDS & amnio toxicity) -Continue warfarin per pharmacy consult - Echocardiogram ordered - Continuous cardiac monitoring  - Strict I/O's: alert provider if UOP < 0.5 mL/kg/hr - Daily weights to assess volume status - Diurese with the use of IV lasix   - Supplemental oxygen as needed, maintain SpO2 > 90% - Cardiology following, appreciate input  CKD stage IIIb - Daily BMP, replace electrolytes PRN - Avoid nephrotoxic agents as able, ensure adequate renal perfusion  Hyperthyroidism -Continue outpatient regimen: Methimazole  - f/u TSH, T3, T4  Type 2 Diabetes Mellitus - Monitor CBG Q 4 hours - SSI moderate dosing - target range while in ICU: 140-180 - follow ICU hyper/hypo-glycemia protocol   Best Practice (right click and "Reselect all SmartList Selections" daily)  Diet/type: NPO w/ meds via tube DVT prophylaxis: DOAC GI prophylaxis: PPI Lines: N/A Foley:  Yes, and it is still needed Code Status:  full code Last date of multidisciplinary goals of care discussion [07/20/21]  Labs   CBC: Recent Labs  Lab 06/28/2021 1740  WBC 12.3*  NEUTROABS 10.5*  HGB 8.5*  HCT 28.0*  MCV 87.8  PLT 430*    Basic Metabolic Panel: Recent Labs  Lab 07/23/2021 1740  NA 134*  K 4.3  CL 103  CO2 22  GLUCOSE 144*  BUN 27*  CREATININE 1.71*  CALCIUM 8.3*   GFR: Estimated Creatinine Clearance: 43.4 mL/min (A) (by C-G formula based on SCr of 1.71 mg/dL (  H)). Recent Labs  Lab 07/11/2021 1740 07/05/2021 2333  WBC 12.3*  --   LATICACIDVEN 1.2 0.9    Liver Function Tests: Recent Labs  Lab 07/09/2021 1740  AST 84*  ALT 40  ALKPHOS 101  BILITOT 0.9  PROT 7.5  ALBUMIN 2.3*   No results for input(s): LIPASE, AMYLASE in the last 168 hours. No results for input(s): AMMONIA in the last 168 hours.  ABG    Component Value Date/Time   PHART 7.42  07/20/2021 0304   PCO2ART 38 07/20/2021 0304   PO2ART 57 (L) 07/20/2021 0304   HCO3 24.6 07/20/2021 0304   ACIDBASEDEF 0.8 06/21/2021 1314   O2SAT 87 07/20/2021 0304     Coagulation Profile: Recent Labs  Lab 06/26/2021 1740  INR 2.6*    Cardiac Enzymes: No results for input(s): CKTOTAL, CKMB, CKMBINDEX, TROPONINI in the last 168 hours.  HbA1C: Hgb A1c MFr Bld  Date/Time Value Ref Range Status  04/23/2016 02:59 AM 6.2 (H) 4.8 - 5.6 % Final    Comment:    (NOTE)         Pre-diabetes: 5.7 - 6.4         Diabetes: >6.4         Glycemic control for adults with diabetes: <7.0     CBG: Recent Labs  Lab 07/18/2021 2330 07/21/2021 2354 07/20/21 0350 07/20/21 0451  GLUCAP 54* 77 79 78    Review of Systems:   UTA- patient lethargic and in respiratory distress  Past Medical History:  He,  has a past medical history of A-fib ([place]), Abnormal heart rhythm, Arthritis, Asthma, CAP (community acquired pneumonia) (04/22/2016), Diabetes mellitus without complication ([place]), Dysrhythmia, GERD (gastroesophageal reflux disease), Hemoptysis (04/22/2016), Hypercholesteremia, Hypertension, Hyperthyroidism, Kidney stones, and Sepsis ([place]) (04/22/2016).   Surgical History:   Past Surgical History:  Procedure Laterality Date   CATARACT EXTRACTION W/PHACO Left 09/26/2016   Procedure: CATARACT EXTRACTION PHACO AND INTRAOCULAR LENS PLACEMENT (IOC);  Surgeon: Eulogio Bear, MD;  Location: [facility] ORS;  Service: Ophthalmology;  Laterality: Left;  [place] 00:52.0 AP% 17.0 CDE 8.86 Fluid pack lot # [id number]   EYE SURGERY Right 2015   cataract   JOINT REPLACEMENT Right    knee   KNEE SURGERY Right 2015   total knee replacement revision   LEFT HEART CATH AND CORONARY ANGIOGRAPHY N/A 06/25/2021   Procedure: LEFT HEART CATH AND CORONARY ANGIOGRAPHY;  Surgeon: Isaias Cowman, MD;  Location: [place] CV LAB;  Service: Cardiovascular;  Laterality: N/A;     Social History:   reports that he has never  smoked. He uses smokeless tobacco. He reports that he does not drink alcohol and does not use drugs.   Family History:  His family history includes Dementia in his mother; Heart disease in his father.   Allergies Allergies  Allergen Reactions   Statins Other (See Comments)    Leg weakness   Sulfa Antibiotics Rash     Home Medications  Prior to Admission medications   Medication Sig Start Date End Date Taking? Authorizing Provider  albuterol (VENTOLIN HFA) 108 (90 Base) MCG/ACT inhaler Inhale into the lungs every 6 (six) hours as needed for wheezing or shortness of breath.   Yes [provider]  amiodarone (PACERONE) 200 MG tablet Take 200 mg by mouth daily.   Yes [provider]  aspirin EC 81 MG EC tablet Take 1 tablet (81 mg total) by mouth daily. Swallow whole. 06/25/21  Yes Wouk, Ailene Rud, MD  budesonide-formoterol [facility])  160-4.5 MCG/ACT inhaler Inhale 2 puffs into the lungs 2 (two) times daily.   Yes [provider]  doxycycline (VIBRA-TABS) 100 MG tablet Take 100 mg by mouth 2 (two) times daily. 07/18/21 07/25/21 Yes [provider]  escitalopram (LEXAPRO) 20 MG tablet Take 20 mg by mouth daily.   Yes [provider]  ezetimibe (ZETIA) 10 MG tablet Take 10 mg by mouth daily.   Yes [provider]  furosemide (LASIX) 40 MG tablet Take 40 mg by mouth.   Yes [provider]  glipiZIDE (GLUCOTROL XL) 10 MG 24 hr tablet Take 10 mg by mouth daily with breakfast.   Yes [provider]  HYDROcodone-acetaminophen (NORCO) 10-325 MG tablet Take 1 tablet by mouth every 6 (six) hours as needed.   Yes [provider]  insulin glargine (LANTUS) 100 UNIT/ML injection Inject 20 Units into the skin daily.   Yes [provider]  metoprolol succinate (TOPROL-XL) 50 MG 24 hr tablet Take 150 mg by mouth daily. Take with or immediately following a meal.   Yes [provider]  Multiple Vitamins-Minerals  (MULTIVITAMIN WITH MINERALS) tablet Take 1 tablet by mouth daily.   Yes [provider]  nystatin (MYCOSTATIN/NYSTOP) powder Apply 1 application. topically daily. Apply to groin topically every day and evening for rash   Yes [provider]  omeprazole (PRILOSEC) 20 MG capsule Take 20 mg by mouth 2 (two) times daily before a meal.   Yes [provider]  polyethylene glycol powder (GLYCOLAX/MIRALAX) 17 GM/SCOOP powder Take 17 g by mouth daily.   Yes [provider]  potassium chloride SA (KLOR-CON M) 20 MEQ tablet Take 20 mEq by mouth daily.   Yes [provider]  predniSONE (DELTASONE) 20 MG tablet Take 20 mg by mouth in the morning and at bedtime. 07/18/21 07/25/21 Yes [provider]  pregabalin (LYRICA) 25 MG capsule Take 1 capsule (25 mg total) by mouth 3 (three) times daily. 06/25/21  Yes Wouk, Ailene Rud, MD  sennosides-docusate sodium (SENOKOT-S) 8.6-50 MG tablet Take 2 tablets by mouth in the morning and at bedtime.   Yes [provider]  traZODone (DESYREL) 50 MG tablet Take 150 mg by mouth at bedtime as needed for sleep. 07/09/21 07/24/21 Yes [provider]  warfarin (COUMADIN) 3 MG tablet Take 3 mg by mouth daily. Take 1.5mg  once daily on Tuesdays,Thursdays,Sundays, and Saturdays Take 3mg  once daily on Mondays, Wednesdays, and Fridays   Yes [provider]  Ampicillin-Sulbactam 3 g in sodium chloride 0.9 % 100 mL Inject 3 g into the vein every 6 (six) hours. 06/25/21   Wouk, Ailene Rud, MD  methimazole (TAPAZOLE) 10 MG tablet Take 1 tablet (10 mg total) by mouth 2 (two) times daily. Patient taking differently: Take 10 mg by mouth daily. 06/25/21   Wouk, Ailene Rud, MD  metoprolol succinate (TOPROL-XL) 25 MG 24 hr tablet Take 1 tablet (25 mg total) by mouth daily. Patient not taking: Reported on 06/26/2021 06/25/21   Gwynne Edinger, MD  mometasone-formoterol [facility]) 200-5 MCG/ACT AERO Inhale 2 puffs into the  lungs 2 (two) times daily. 06/25/21   Wouk, Ailene Rud, MD  pantoprazole (PROTONIX) 40 MG injection Inject 40 mg into the vein every 12 (twelve) hours. 06/25/21   Wouk, Ailene Rud, MD     Critical care time: 60 minutes       Venetia Night, [place] Acute Care Nurse Practitioner [facility] Pulmonary & Critical Care   [phone] / [phone]  Please see Amion for pager details.

## 2021-07-20 NOTE — Progress Notes (Signed)
Initial Nutrition Assessment  DOCUMENTATION CODES:   Not applicable  INTERVENTION:   Initiate Vital 1.5 @ 20 ml/hr via OGT and increase by 10 ml every 4 hours to goal rate of 60 ml/hr.   45 ml Prosource TF TID.  30 ml free water flush every 4 hours to maintain tube patency    Tube feeding regimen provides 2280 kcal (100% of needs), 130 grams of protein, and 1100 ml of H2O.  Total free water: 1280 ml daily  TF + propofol to provide 2888 kcals  NUTRITION DIAGNOSIS:   Moderate Malnutrition related to chronic illness (CAD) as evidenced by mild fat depletion, moderate fat depletion, mild muscle depletion, moderate muscle depletion.  GOAL:   Patient will meet greater than or equal to 90% of their needs  MONITOR:   Vent status, TF tolerance  REASON FOR ASSESSMENT:   Ventilator    ASSESSMENT:   Pt with medical history significant of CAD and coming for SOB and hypoxic  Pt admitted with SOB and acute respiratory failure with hypoxia.   5/26- failed Bi-pap, intubated  Patient is currently intubated on ventilator support. OGT placement verified by x-ray.  MV: 8.6 L/min Temp (24hrs), Avg:99.4 F (37.4 C), Min:98.4 F (36.9 C), Max:102.2 F (39 C)  Propofol: 22.8 ml/hr (provides 608 kcals)  Reviewed I/O's: +146 ml x 24 hours  UOP: 400 ml x 24 hours  Case discussed with RN, MD, and during ICU rounds. Pt had a CABG earlier this month and had recent hospitalization with aspiration pneumonia. He has been residing at [facility]. RD received permission to start TF.   Reviewed wt hx; wt has been stable over the past 2 months.    Medications reviewed and include colace, lasix, miralax, and neo-synephrine.  Lab Results  Component Value Date   HGBA1C 6.3 (H) 07/20/2021   PTA DM medications are 20 units insulin glargine daily and 10 mg glipizide daily.   Labs reviewed: CBGS: 64-153 (inpatient orders for glycemic control are 0-15 units insulin aspart every 4 hours).    NUTRITION -  FOCUSED PHYSICAL EXAM:  Flowsheet Row Most Recent Value  Orbital Region Moderate depletion  Upper Arm Region No depletion  Thoracic and Lumbar Region No depletion  Buccal Region Moderate depletion  Temple Region Moderate depletion  Clavicle Bone Region Mild depletion  Clavicle and Acromion Bone Region Mild depletion  Scapular Bone Region Mild depletion  Dorsal Hand Mild depletion  Patellar Region Moderate depletion  Anterior Thigh Region Moderate depletion  Posterior Calf Region Moderate depletion  Edema (RD Assessment) None  Hair Reviewed  Eyes Reviewed  Mouth Reviewed  Skin Reviewed  Nails Reviewed       Diet Order:   Diet Order             Diet NPO time specified  Diet effective now                   EDUCATION NEEDS:   Not appropriate for education at this time  Skin:  Skin Assessment: Skin Integrity Issues: Skin Integrity Issues:: Incisions, DTI, Stage I DTI: rt tibial posterior Stage I: coccyx Incisions: sternum  Last BM:  07/20/21  Height:   Ht Readings from Last 1 Encounters:  08-04-2021 6\' 2"  (1.88 m)    Weight:   Wt Readings from Last 1 Encounters:  08-04-21 94.8 kg    Ideal Body Weight:  86.4 kg  BMI:  Body mass index is 26.83 kg/m.  Estimated Nutritional Needs:  Kcal:  2255  Protein:  125-150 grams  Fluid:  > 2 L    Loistine Chance, RD, LDN, [place] Registered Dietitian II Certified Diabetes Care and Education Specialist Please refer to [facility] for RD and/or RD on-call/weekend/after hours pager

## 2021-07-20 NOTE — Progress Notes (Signed)
PT transported from ED bed 6 to ICU bed 13 on V60/BIPAP with full tank.

## 2021-07-20 NOTE — ED Notes (Signed)
Pt's O2 saturation was 89%, RN increased to 6L [place] with no improvement. Pt noted to have increased work of breathing. Margo Aye MD currently at bedside. MD decided to place pt on BIPAP. Respiratory currently at bedside.

## 2021-07-20 NOTE — Procedures (Cosign Needed Addendum)
Intubation Procedure Note  VALLON RIOFRIO  [id number]  Aug 13, 1945  Date:07/20/21  Time:5:41 AM   Provider Performing:Eriyonna Matsushita L Rust-[place]ster    Procedure: Intubation (31500)  Indication(s) Respiratory Failure  Consent Unable to obtain consent due to emergent nature of procedure.   Anesthesia Etomidate, Versed, and Fentanyl   Time Out Verified patient identification, verified procedure, site/side was marked, verified correct patient position, special equipment/implants available, medications/allergies/relevant history reviewed, required imaging and test results available.   Sterile Technique Usual hand hygeine, masks, and gloves were used   Procedure Description Patient positioned in bed supine.  Sedation given as noted above.  Patient was intubated with endotracheal tube using Glidescope.  View was Grade 1 full glottis .  Number of attempts was 1.  Colorimetric CO2 detector was consistent with tracheal placement.   Complications/Tolerance None; patient tolerated the procedure well. Chest X-ray is ordered to verify placement.   EBL Minimal   Specimen(s) None  Venetia Night, AGACNP-BC Acute Care Nurse Practitioner [place] [facility] & Critical [facility]   [phone] / [phone] Please see Amion for pager details.

## 2021-07-20 NOTE — Progress Notes (Signed)
[place] for warfarin Indication: atrial fibrillation  Patient Measurements: Height: 6\' 2"  (188 cm) Weight: 94.8 kg (209 lb) IBW/kg (Calculated) : 82.2  Labs: Recent Labs    07/07/2021 1740 07/20/2021 2333 07/20/21 0610  HGB 8.5*  --  8.7*  HCT 28.0*  --  29.3*  PLT 430*  --  467*  APTT 38*  --   --   LABPROT 27.5*  --  30.9*  INR 2.6*  --  3.0*  CREATININE 1.71*  --  2.06*  TROPONINIHS 44* 46* 54*    Estimated Creatinine Clearance: 36 mL/min (A) (by C-G formula based on SCr of 2.06 mg/dL (H)).  Medical History: Past Medical History:  Diagnosis Date   A-fib ([place])    Abnormal heart rhythm    Arthritis    Asthma    CAP (community acquired pneumonia) 04/22/2016   Diabetes mellitus without complication ([place])    Dysrhythmia    afib treated with flecainide   GERD (gastroesophageal reflux disease)    Hemoptysis 04/22/2016   Hypercholesteremia    Hypertension    Hyperthyroidism    Kidney stones    Sepsis ([place]) 04/22/2016    Assessment: 75yo with past medical history of HFpEF, paroxysmal atrial fibrillation (on warfarin, metoprolol, and flecainide), bilateral carotid stenosis who presented to the ED with SOB. Pharmacy is asked to dose warfarin while inpatient. INR is currently therapeutic on admission  Last warfarin regimen as follows: 3 mg on MoWeFr and 1.5 mg SuTuThSa  Date INR Plan  5/25 2.6 1 mg  5/26 3 Hold   DDIs: cefepime, linezolid Albumin 2.3, mild transaminitis  Goal of Therapy:  INR 2-3 Monitor platelets by anticoagulation protocol: Yes   Plan:  --INR is therapeutic at [facility] --Will hold warfarin tonight --Suspect increased warfarin sensitivity secondary to DDIs, liver dysfunction, and lack of nutrition --Daily INR per protocol --CBC at least every 3 days per protocol  Benita Gutter 07/20/2021,8:26 AM

## 2021-07-20 NOTE — Progress Notes (Signed)
*  PRELIMINARY RESULTS* Echocardiogram 2D Echocardiogram has been performed.  Alan Stafford 07/20/2021, 10:38 AM

## 2021-07-20 NOTE — Progress Notes (Signed)
OT Cancellation Note  Patient Details Name: Alan Stafford MRN: [id number] DOB: [birth year]   Cancelled Treatment:    Reason Eval/Treat Not Completed: Patient not medically ready. Consult received, chart reviewed. Pt noted to be intubated this morning. Due to change in pt status, will require new therapy orders once pt is medically appropriate. MD notified.   Arman Filter., MPH, MS, OTR/L ascom [phone] 07/20/21, 8:29 AM

## 2021-07-20 NOTE — Progress Notes (Addendum)
Received a call from bedside RN regarding increased oxygen requirement from 2 L to 6 L.  Not on oxygen supplementation at baseline.  The patient was admitted overnight for acute hypoxic respiratory failure secondary to HCAP, acute CHF with pulmonary edema, BNP greater than 700.  Was started on cefepime, azithromycin and IV vancomycin.  Also received low doses of IV Lasix due to soft BPs.  Upon presentation to the room in the ER, the patient is lying in bed with increased work of breathing, using accessory muscles to breathe.  His daughter is sitting at bedside.  The patient is somnolent but easily arousable to voices, answers to questions appropriately.  He is in mildly tachycardic in the low 100s with history of paroxysmal A-fib on home Coumadin, amiodarone, Toprol-XL.  Respiratory rate 38 at bedside.  Stat ABG ordered, pH 7.42/38/57 on 6 L nasal cannula.  Due to increased work of breathing with respiration rate in the high 30s the patient was placed on BiPAP.  He can be taken off the BiPAP in the morning for breaks and switch to high flow nasal cannula to maintain his O2 saturation greater than 90%.  IV antibiotics adjusted, continue cefepime with no history of seizures, DC IV azithromycin due to prolonged QTc 587.  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  Added IV linezolid for history of positive MRSA screening.  Sputum culture has been ordered to narrow down antibiotics.  Personally reviewed chest x-ray done on admission which showed bilateral pulmonary infiltrates worse on the right side.  No history of dysphagia per his daughter at bedside.  The patient passed a swallow evaluation by RN.  We will continue low-dose p.o. Toprol-XL due to history of paroxysmal A-fib to avoid conversion to A-fib with RVR and to avoid beta-blocker withdrawal.    Holding off home Amiodarone due to severe HCAP and hypoxemia with concern for impending ARDS.  Defer to cardiology to restart.    On  coumadin for CVA prevention.  Last INR 2.6, renally dosed by pharmacy.  The patient is transferred to stepdown unit for closer monitoring and to continue management of his present condition.  Updated his daughter at bedside.  All questions answered to the best of my ability.  Curbsided with PCCM to make them aware of the patient.  If no significant improvement in the day shift, please consult PCCM.  Cardiology consulted urgently, due to his recent CABG at [facility] and message sent to Dr. Beatrix Fetters via secure chat.  Labs ordered: CBC with differentials, CMP, magnesium, phosphorus, procalcitonin, BNP.  Repeat 12 EKG for prolonged QTc of 587.  Optimize magnesium and potassium levels.

## 2021-07-21 ENCOUNTER — Inpatient Hospital Stay: Payer: Medicare Other

## 2021-07-21 ENCOUNTER — Inpatient Hospital Stay: Payer: Self-pay

## 2021-07-21 DIAGNOSIS — R0602 Shortness of breath: Secondary | ICD-10-CM | POA: Diagnosis not present

## 2021-07-21 DIAGNOSIS — N179 Acute kidney failure, unspecified: Secondary | ICD-10-CM

## 2021-07-21 DIAGNOSIS — I5023 Acute on chronic systolic (congestive) heart failure: Secondary | ICD-10-CM | POA: Diagnosis not present

## 2021-07-21 DIAGNOSIS — J9601 Acute respiratory failure with hypoxia: Secondary | ICD-10-CM | POA: Diagnosis not present

## 2021-07-21 DIAGNOSIS — I480 Paroxysmal atrial fibrillation: Secondary | ICD-10-CM | POA: Diagnosis not present

## 2021-07-21 LAB — BASIC METABOLIC PANEL
Anion gap: 11 (ref 5–15)
BUN: 48 mg/dL — ABNORMAL HIGH (ref 8–23)
CO2: 23 mmol/L (ref 22–32)
Calcium: 8.3 mg/dL — ABNORMAL LOW (ref 8.9–10.3)
Chloride: 101 mmol/L (ref 98–111)
Creatinine, Ser: 3.06 mg/dL — ABNORMAL HIGH (ref 0.61–1.24)
GFR, Estimated: 21 mL/min — ABNORMAL LOW (ref >60)
Glucose, Bld: 220 mg/dL — ABNORMAL HIGH (ref 70–99)
Potassium: 6.1 mmol/L — ABNORMAL HIGH (ref 3.5–5.1)
Sodium: 135 mmol/L (ref 135–145)

## 2021-07-21 LAB — GLUCOSE, CAPILLARY
Glucose-Capillary: 179 mg/dL — ABNORMAL HIGH (ref 70–99)
Glucose-Capillary: 214 mg/dL — ABNORMAL HIGH (ref 70–99)
Glucose-Capillary: 203 mg/dL — ABNORMAL HIGH (ref 70–99)
Glucose-Capillary: 209 mg/dL — ABNORMAL HIGH (ref 70–99)
Glucose-Capillary: 203 mg/dL — ABNORMAL HIGH (ref 70–99)
Glucose-Capillary: 162 mg/dL — ABNORMAL HIGH (ref 70–99)

## 2021-07-21 LAB — PHOSPHORUS: Phosphorus: 8.7 mg/dL — ABNORMAL HIGH (ref 2.5–4.6)

## 2021-07-21 LAB — CBC WITH DIFFERENTIAL/PLATELET
Abs Immature Granulocytes: 0.19 10*3/uL — ABNORMAL HIGH (ref 0.00–0.07)
Basophils Absolute: 0 10*3/uL (ref 0.0–0.1)
Basophils Relative: 0 %
Eosinophils Absolute: 0 10*3/uL (ref 0.0–0.5)
Eosinophils Relative: 0 %
HCT: 29.1 % — ABNORMAL LOW (ref 39.0–52.0)
Hemoglobin: 8.2 g/dL — ABNORMAL LOW (ref 13.0–17.0)
Immature Granulocytes: 1 %
Lymphocytes Relative: 5 %
Lymphs Abs: 0.7 10*3/uL (ref 0.7–4.0)
MCH: 25.8 pg — ABNORMAL LOW (ref 26.0–34.0)
MCHC: 28.2 g/dL — ABNORMAL LOW (ref 30.0–36.0)
MCV: 91.5 fL (ref 80.0–100.0)
Monocytes Absolute: 0.3 10*3/uL (ref 0.1–1.0)
Monocytes Relative: 2 %
Neutro Abs: 12.4 10*3/uL — ABNORMAL HIGH (ref 1.7–7.7)
Neutrophils Relative %: 92 %
Platelets: 363 10*3/uL (ref 150–400)
RBC: 3.18 MIL/uL — ABNORMAL LOW (ref 4.22–5.81)
RDW: 16.3 % — ABNORMAL HIGH (ref 11.5–15.5)
WBC: 13.5 10*3/uL — ABNORMAL HIGH (ref 4.0–10.5)
nRBC: 0.1 % (ref 0.0–0.2)

## 2021-07-21 LAB — PROTIME-INR
INR: 2.8 — ABNORMAL HIGH (ref 0.8–1.2)
Prothrombin Time: 29.1 seconds — ABNORMAL HIGH (ref 11.4–15.2)

## 2021-07-21 LAB — T3, FREE: T3, Free: 1.6 pg/mL — ABNORMAL LOW (ref 2.0–4.4)

## 2021-07-21 LAB — TRIGLYCERIDES: Triglycerides: 121 mg/dL (ref <150)

## 2021-07-21 LAB — MAGNESIUM: Magnesium: 2.3 mg/dL (ref 1.7–2.4)

## 2021-07-21 MED ORDER — SODIUM CHLORIDE 0.9% FLUSH
10.0000 mL | Freq: Two times a day (BID) | INTRAVENOUS | Status: DC
  Administered 2021-07-21 – 2021-07-26 (×10): 10 mL

## 2021-07-21 MED ORDER — MIDAZOLAM HCL 2 MG/2ML IJ SOLN
4.0000 mg | Freq: Once | INTRAMUSCULAR | Status: AC
  Administered 2021-07-21: 4 mg via INTRAVENOUS
  Filled 2021-07-21: qty 4

## 2021-07-21 MED ORDER — ALBUMIN HUMAN 5 % IV SOLN
25.0000 g | Freq: Once | INTRAVENOUS | Status: AC
  Administered 2021-07-21: 12.5 g via INTRAVENOUS
  Filled 2021-07-21: qty 500

## 2021-07-21 MED ORDER — WARFARIN SODIUM 1 MG PO TABS
1.5000 mg | ORAL_TABLET | Freq: Once | ORAL | Status: AC
  Administered 2021-07-21: 1.5 mg
  Filled 2021-07-21: qty 1

## 2021-07-21 MED ORDER — SODIUM ZIRCONIUM CYCLOSILICATE 5 G PO PACK
10.0000 g | PACK | Freq: Two times a day (BID) | ORAL | Status: AC
  Administered 2021-07-21 (×2): 10 g
  Filled 2021-07-21 (×2): qty 2

## 2021-07-21 MED ORDER — SODIUM CHLORIDE 0.9% FLUSH: 10.0000 mL | INTRAVENOUS | Status: DC | PRN

## 2021-07-21 MED ORDER — SODIUM CHLORIDE 0.9 % IV SOLN
2.0000 g | INTRAVENOUS | Status: DC
  Administered 2021-07-22 – 2021-07-24 (×3): 2 g via INTRAVENOUS
  Filled 2021-07-21: qty 2
  Filled 2021-07-21: qty 12.5
  Filled 2021-07-21: qty 2

## 2021-07-21 MED ORDER — HYDROMORPHONE HCL 1 MG/ML IJ SOLN
0.5000 mg | INTRAMUSCULAR | Status: DC | PRN
  Administered 2021-07-22: 0.5 mg via INTRAVENOUS
  Filled 2021-07-21: qty 1

## 2021-07-21 NOTE — Progress Notes (Signed)
ANTICOAGULATION CONSULT NOTE  Pharmacy Consult for warfarin Indication: atrial fibrillation  Patient Measurements: Height: 6\' 2"  (188 cm) Weight: 94.8 kg (209 lb) IBW/kg (Calculated) : 82.2  Labs: Recent Labs    18-Aug-2021 1740 18-Aug-2021 2333 07/20/21 0610 07/21/21 0547  HGB 8.5*  --  8.7* 8.2*  HCT 28.0*  --  29.3* 29.1*  PLT 430*  --  467* 363  APTT 38*  --   --   --   LABPROT 27.5*  --  30.9* 29.1*  INR 2.6*  --  3.0* 2.8*  CREATININE 1.71*  --  2.06* 3.06*  TROPONINIHS 44* 46* 54*  --     Estimated Creatinine Clearance: 24.3 mL/min (A) (by C-G formula based on SCr of 3.06 mg/dL (H)).  Medical History: Past Medical History:  Diagnosis Date   A-fib ([facility])    Abnormal heart rhythm    Arthritis    Asthma    CAP (community acquired pneumonia) 04/22/2016   Diabetes mellitus without complication ([facility])    Dysrhythmia    afib treated with flecainide   GERD (gastroesophageal reflux disease)    Hemoptysis 04/22/2016   Hypercholesteremia    Hypertension    Hyperthyroidism    Kidney stones    Sepsis ([facility]) 04/22/2016    Assessment: 75yo with past medical history of HFpEF, paroxysmal atrial fibrillation (on warfarin, metoprolol, and flecainide), bilateral carotid stenosis who presented to the ED with SOB. Pharmacy is asked to dose warfarin while inpatient. INR is currently therapeutic on admission  Last warfarin regimen as follows: 3 mg on MoWeFr and 1.5 mg SuTuThSa  Date INR Plan  5/25 2.6 1 mg  5/26 3 Hold  5/27 2.8 1.5 mg   DDIs: cefepime, linezolid Albumin 2.3, mild transaminitis  Goal of Therapy:  INR 2-3 Monitor platelets by anticoagulation protocol: Yes   Plan:  --Will order warfarin 1.5 mg x 1 for tonight --Suspect increased warfarin sensitivity secondary to DDIs, liver dysfunction, and lack of nutrition --Daily INR per protocol --CBC at least every 3 days per protocol  Xaviar Lunn A Enslie Sahota 07/21/2021,9:11 AM

## 2021-07-21 NOTE — Progress Notes (Signed)
Spoke with Myra RN re PICC order.  Plan on PICC placement this pm due to hypotensive at this time and pressors just restarted.  Verbalizes understanding.

## 2021-07-21 NOTE — Consult Note (Signed)
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 Patient is currently admitted to the ICU where he is intubated, sedated, and on mechanical ventilation.   Goal of Therapy:  Electrolytes within normal limits  Plan:  --Renal function worsening(SCR 1.71>2.06>3.06) --K 6.1, discussed with CCMD, will order lokelma 10g x 2. Will consult nephrology if scr continues to worsen --Follow-up electrolytes with AM labs tomorrow  Bettey Costa 07/21/2021 11:58 AM

## 2021-07-21 NOTE — Procedures (Signed)
  PROCEDURE: BRONCHOSCOPY Therapeutic Aspiration of Tracheobronchial Tree  PROCEDURE DATE: 07/21/2021  TIME:  NAME:  Alan Stafford  DOB:Nov 12, 1945  MRN: [id number] LOC:  IC13A/IC13A-AA    HOSP DAY: @LENGTHOFSTAYDAYS @ CODE STATUS:      Code Status Orders  (From admission, onward)           Start     Ordered   07/24/2021 1955  Full code  Continuous        07/12/2021 1957           Code Status History     Date Active Date Inactive Code Status Order ID Comments User Context   06/21/2021 1231 06/26/2021 0156 Full Code 08/26/2021  [id number], MD ED   05/18/2016 2150 05/23/2016 1514 Full Code 05/25/2016  [id number], MD Inpatient   04/23/2016 0247 04/25/2016 1454 Full Code 06/25/2016  [id number], MD Inpatient           Indications/Preliminary Diagnosis:   Consent: (Place X beside choice/s below)  The benefits, risks and possible complications of the procedure were        explained to:  ___ patient  __x_ patient's family  ___ other:___________  who verbalized understanding and gave:  __x_ verbal  __x_ written  ___ verbal and written  ___ telephone  ___ other:________ consent.      Unable to obtain consent; procedure performed on emergent basis.     Other:      PROCEDURE DETAILS: Timeout performed and correct patient, name, & ID confirmed. Following prep per Pulmonary policy, appropriate sedation was administered. The Bronchoscope was inserted in to ETT  with bite block in place. Therapeutic aspiration of Tracheobronchial tree was performed.  Airway exam proceeded with findings, technical procedures, and specimen collection as noted below. At the end of exam the scope was withdrawn without incident. Impression and Plan as noted below.     Insertion Route (Place X beside choice below)   Nasal   Oral  x Endotracheal Tube   Tracheostomy   INTRAPROCEDURE MEDICATIONS:  Sedative/Narcotic Amt Dose   Versed 4 mg   Fentanyl  mcg  Diprivan gtt mg        TECHNICAL  PROCEDURES: (Place X beside choice below)   Procedures  Description    None     Electrocautery     Cryotherapy     Balloon Dilatation     Bronchography     Stent Placement   x  Therapeutic Aspiration     Laser/Argon Plasma    Brachytherapy Catheter Placement    Foreign Body Removal         SPECIMENS (Sites): (Place X beside choice below)  Specimens Description   No Specimens Obtained    x Washings    Lavage    Biopsies    Fine Needle Aspirates    Brushings    Sputum    FINDINGS: Normal airway anatomy. A complete airway survey for lesions was not completed as the scope used was a disposable version. There were copious white to clear viscous secretions.  ESTIMATED BLOOD LOSS: none  COMPLICATIONS/RESOLUTION: none  IMPRESSION:POST-PROCEDURE DX: secretions   RECOMMENDATION/PLAN:   Monitor

## 2021-07-21 NOTE — Progress Notes (Signed)
Pt. Bronched at bedside by Dr. Earlie Server.Large amt. Of thick white secretions suctionned out of pt's ETT. Pt.tolerated procedure well.

## 2021-07-21 NOTE — Consult Note (Signed)
[employer] NOTE       Patient ID: EHAB LEANDRO MRN: [id number] DOB/AGE: February 04, 1946 76 y.o.  Admit date: 07/22/2021 Referring Physician Dr. Irene Pap  Primary Physician [facility] primary care  Primary Cardiologist Dr. Clayborn Bigness Reason for Consultation acute on chronic HFpEF, s/p CABG 06/27/21  HPI: Alan Stafford is a 76 year old male with a past medical history of NSTEMI s/p recent CABG x3 (SVG-OM-diagonal, LIMA-LAD) and left atrial appendage ligation (0000000, Dr. Silvio Pate), HFpEF (LVEF 55-60%, w/o WMA's  moderate MR), paroxysmal atrial fibrillation/flutter s/p ablation (2012, previously on flecainide, recently started on amiodarone), bilateral carotid artery stenosis, hypertension, hyperlipidemia, type 2 diabetes, hyperthyroidism on MMI, CKD 3, COPD, CVA, chronic pain who presented to [facility] ED from short-term rehab on 06/28/2021 with shortness of breath and productive cough that has been worsening over the past week failing outpatient antibiotic treatment for presumed pneumonia that was prescribed at rehab.  He was admitted to stepdown on BiPAP, but decompensated and required intubation overnight. Was in A-fib with RVR, now in sinus. Hypotensive, started on phenylephrine. Cardiology is consulted for assistance with his heart failure.   Interval history: - Remains intubated - Following directions overnight. No acute events.  - Family at bedside.   Review of systems complete and found to be negative unless listed above     Past Medical History:  Diagnosis Date   A-fib ([place])    Abnormal heart rhythm    Arthritis    Asthma    CAP (community acquired pneumonia) 04/22/2016   Diabetes mellitus without complication ([place])    Dysrhythmia    afib treated with flecainide   GERD (gastroesophageal reflux disease)    Hemoptysis 04/22/2016   Hypercholesteremia    Hypertension    Hyperthyroidism    Kidney stones    Sepsis ([place]) 04/22/2016    Past Surgical History:  Procedure  Laterality Date   CATARACT EXTRACTION W/PHACO Left 09/26/2016   Procedure: CATARACT EXTRACTION PHACO AND INTRAOCULAR LENS PLACEMENT ([place]);  Surgeon: Eulogio Bear, MD;  Location: [facility] ORS;  Service: Ophthalmology;  Laterality: Left;  [place] 00:52.0 AP% 17.0 CDE 8.86 Fluid pack lot # [id number]   EYE SURGERY Right 2015   cataract   JOINT REPLACEMENT Right    knee   KNEE SURGERY Right 2015   total knee replacement revision   LEFT HEART CATH AND CORONARY ANGIOGRAPHY N/A 06/25/2021   Procedure: LEFT HEART CATH AND CORONARY ANGIOGRAPHY;  Surgeon: Isaias Cowman, MD;  Location: [facility];  Service: Cardiovascular;  Laterality: N/A;    Medications Prior to Admission  Medication Sig Dispense Refill Last Dose   albuterol (VENTOLIN HFA) 108 (90 Base) MCG/ACT inhaler Inhale into the lungs every 6 (six) hours as needed for wheezing or shortness of breath.      amiodarone (PACERONE) 200 MG tablet Take 200 mg by mouth daily.      aspirin EC 81 MG EC tablet Take 1 tablet (81 mg total) by mouth daily. Swallow whole. 30 tablet 11    budesonide-formoterol (SYMBICORT) 160-4.5 MCG/ACT inhaler Inhale 2 puffs into the lungs 2 (two) times daily.      doxycycline (VIBRA-TABS) 100 MG tablet Take 100 mg by mouth 2 (two) times daily.      escitalopram (LEXAPRO) 20 MG tablet Take 20 mg by mouth daily.      ezetimibe (ZETIA) 10 MG tablet Take 10 mg by mouth daily.      furosemide (LASIX) 40 MG tablet Take 40 mg by mouth.  glipiZIDE (GLUCOTROL XL) 10 MG 24 hr tablet Take 10 mg by mouth daily with breakfast.      HYDROcodone-acetaminophen (NORCO) 10-325 MG tablet Take 1 tablet by mouth every 6 (six) hours as needed.      insulin glargine (LANTUS) 100 UNIT/ML injection Inject 20 Units into the skin daily.      metoprolol succinate (TOPROL-XL) 50 MG 24 hr tablet Take 150 mg by mouth daily. Take with or immediately following a meal.      Multiple Vitamins-Minerals (MULTIVITAMIN WITH MINERALS) tablet Take  1 tablet by mouth daily.      nystatin (MYCOSTATIN/NYSTOP) powder Apply 1 application. topically daily. Apply to groin topically every day and evening for rash      omeprazole (PRILOSEC) 20 MG capsule Take 20 mg by mouth 2 (two) times daily before a meal.      polyethylene glycol powder (GLYCOLAX/MIRALAX) 17 GM/SCOOP powder Take 17 g by mouth daily.      potassium chloride SA (KLOR-CON M) 20 MEQ tablet Take 20 mEq by mouth daily.      predniSONE (DELTASONE) 20 MG tablet Take 20 mg by mouth in the morning and at bedtime.      pregabalin (LYRICA) 25 MG capsule Take 1 capsule (25 mg total) by mouth 3 (three) times daily.      sennosides-docusate sodium (SENOKOT-S) 8.6-50 MG tablet Take 2 tablets by mouth in the morning and at bedtime.      traZODone (DESYREL) 50 MG tablet Take 150 mg by mouth at bedtime as needed for sleep.      warfarin (COUMADIN) 3 MG tablet Take 3 mg by mouth daily. Take 1.5mg  once daily on Tuesdays,Thursdays,Sundays, and Saturdays Take 3mg  once daily on Mondays, Wednesdays, and Fridays      Ampicillin-Sulbactam 3 g in sodium chloride 0.9 % 100 mL Inject 3 g into the vein every 6 (six) hours.      methimazole (TAPAZOLE) 10 MG tablet Take 1 tablet (10 mg total) by mouth 2 (two) times daily. (Patient taking differently: Take 10 mg by mouth daily.)      metoprolol succinate (TOPROL-XL) 25 MG 24 hr tablet Take 1 tablet (25 mg total) by mouth daily. (Patient not taking: Reported on 07/25/2021)   Not Taking   mometasone-formoterol (DULERA) 200-5 MCG/ACT AERO Inhale 2 puffs into the lungs 2 (two) times daily. 1 each     pantoprazole (PROTONIX) 40 MG injection Inject 40 mg into the vein every 12 (twelve) hours. 1 each     Social History   Socioeconomic History   Marital status: Married    Spouse name: Not on file   Number of children: Not on file   Years of education: Not on file   Highest education level: Not on file  Occupational History   Not on file  Tobacco Use   Smoking  status: Never   Smokeless tobacco: Current   Tobacco comments:    uses dip a little  Vaping Use   Vaping Use: Never used  Substance and Sexual Activity   Alcohol use: No    Alcohol/week: 0.0 standard drinks   Drug use: No   Sexual activity: Not on file  Other Topics Concern   Not on file  Social History Narrative   Not on file   Social Determinants of Health   Financial Resource Strain: Not on file  Food Insecurity: Not on file  Transportation Needs: Not on file  Physical Activity: Not on file  Stress: Not on  file  Social Connections: Not on file  Intimate Partner Violence: Not on file    Family History  Problem Relation Age of Onset   Dementia Mother    Heart disease Father       PHYSICAL EXAM General: Elderly and critically ill appearing Caucasian male, intubated with mouth open, wife and 3 daughters at bedside.   HEENT:  Normocephalic and atraumatic. Neck:  No JVD.  Lungs: Coarse breath sounds bilaterally, diminished at the bases. Chest: Gauze covering median sternotomy incision, Holter monitor left chest Heart: HRRR . Normal S1 and S2 without gallops or murmurs.  Abdomen: Mildly distended appearing appearing.  Msk: Normal strength and tone for age. Extremities: Warm and well perfused. No clubbing, cyanosis.  No lower extremity edema.  SCDs bilaterally Neuro: Intubated, sedated Psych: Intubated, sedated  Labs:   Lab Results  Component Value Date   WBC 13.5 (H) 07/21/2021   HGB 8.2 (L) 07/21/2021   HCT 29.1 (L) 07/21/2021   MCV 91.5 07/21/2021   PLT 363 07/21/2021    Recent Labs  Lab 07/20/21 0610  NA 137  K 4.6  CL 105  CO2 23  BUN 30*  CREATININE 2.06*  CALCIUM 8.2*  PROT 7.7  BILITOT 1.6*  ALKPHOS 129*  ALT 54*  AST 126*  GLUCOSE 104*    Lab Results  Component Value Date   TROPONINI <0.03 05/18/2016    No results found for: CHOL No results found for: HDL No results found for: [facility] Lab Results  Component Value Date   TRIG 121  07/21/2021   No results found for: CHOLHDL No results found for: LDLDIRECT    Radiology: DG Chest 2 View  Result Date: 06/21/2021 CLINICAL DATA:  Weakness EXAM: CHEST - 2 VIEW COMPARISON:  12/18/2016 FINDINGS: Moderate enlargement of the cardiopericardial silhouette with indistinct airspace opacities primarily at the lung bases and peripherally in the left mid lung, with associated obscuration of the hemidiaphragms. Faint Kerley B lines seen in the right upper lobe, bilateral Awanda Mink a lines are present. Potential blunting of the left lateral costophrenic angle. Chronic wedging of upper thoracic vertebra, no change from 05/18/2016. Low lung volumes are present, causing crowding of the pulmonary vasculature. Old right posterolateral rib deformities. IMPRESSION: 1. Moderate enlargement of the cardiopericardial silhouette with bibasilar airspace opacities and Kerley B lines. The airspace opacities could be a manifestation of pulmonary edema, although aspiration or multilobar pneumonia is not excluded. 2. Indistinct left costophrenic angle laterally, cannot exclude small left pleural effusion. Electronically Signed   By: Van Clines M.D.   On: 06/21/2021 09:41   DG Abd 1 View  Result Date: 07/20/2021 CLINICAL DATA:  OG tube placement. EXAM: ABDOMEN - 1 VIEW COMPARISON:  01/28/2020 FINDINGS: OG tube tip is in the distal stomach. Gaseous distension of: Evident with some mildly distended gas-filled small bowel loops in the upper pelvis. IMPRESSION: OG tube tip is in the distal stomach. Electronically Signed   By: Misty Stanley M.D.   On: 07/20/2021 06:00   CT Head Wo Contrast  Result Date: 06/21/2021 CLINICAL DATA:  Mental status change EXAM: CT HEAD WITHOUT CONTRAST TECHNIQUE: Contiguous axial images were obtained from the base of the skull through the vertex without intravenous contrast. RADIATION DOSE REDUCTION: This exam was performed according to the departmental dose-optimization program which  includes automated exposure control, adjustment of the mA and/or kV according to patient size and/or use of iterative reconstruction technique. COMPARISON:  None. FINDINGS: Brain: No evidence of acute infarction,  hemorrhage, extra-axial collection, ventriculomegaly, or mass effect. Generalized cerebral atrophy. Vascular: Cerebrovascular atherosclerotic calcifications are noted. Skull: Negative for fracture or focal lesion. Sinuses/Orbits: Visualized portions of the orbits are unremarkable. Visualized portions of the paranasal sinuses are unremarkable. Visualized portions of the mastoid air cells are unremarkable. Other: None. IMPRESSION: 1. No acute intracranial abnormality. 2. Generalized cerebral atrophy. Electronically Signed   By: Elige Ko M.D.   On: 06/21/2021 09:27   CT Angio Chest PE W/Cm &/Or Wo Cm  Result Date: 06/21/2021 CLINICAL DATA:  Weakness and shortness of breath. EXAM: CT ANGIOGRAPHY CHEST WITH CONTRAST TECHNIQUE: Multidetector CT imaging of the chest was performed using the standard protocol during bolus administration of intravenous contrast. Multiplanar CT image reconstructions and MIPs were obtained to evaluate the vascular anatomy. RADIATION DOSE REDUCTION: This exam was performed according to the departmental dose-optimization program which includes automated exposure control, adjustment of the mA and/or kV according to patient size and/or use of iterative reconstruction technique. CONTRAST:  17mL OMNIPAQUE IOHEXOL 350 MG/ML SOLN COMPARISON:  05/18/2016 FINDINGS: Cardiovascular: The heart is mildly enlarged. No pericardial effusion. There is stable tortuosity, ectasia and calcification of the thoracic aorta but no dissection or aortic aneurysm. The branch vessels are patent. Stable three-vessel coronary artery calcifications. The pulmonary arterial tree is suboptimally opacified but no large central pulmonary emboli are identified. Mediastinum/Nodes: Mediastinal and hilar  lymphadenopathy similar to prior CT scan from 2018 suggesting chronic reactive process. Sarcoidosis would be another possibility. Some of the nodes are calcified. The esophagus is grossly. Lungs/Pleura: Chronic underlying interstitial lung disease with superimposed lower lobe predominant extensive airspace process. It is possible this is asymmetric pulmonary edema but it is more concerning for multifocal pneumonia or aspiration. No pleural effusions. No discrete worrisome pulmonary lesions. Upper Abdomen: No significant upper abdominal findings. Cholelithiasis is noted. Stable vascular calcifications. Musculoskeletal: No chest wall mass, supraclavicular or axillary adenopathy. The thyroid gland is unremarkable. The bony thorax is intact. The left remote upper thoracic compression fractures. Review of the MIP images confirms the above findings. IMPRESSION: 1. Suboptimal study but no CT findings for large central pulmonary emboli. 2. Stable tortuosity, ectasia and calcification of the thoracic aorta but no dissection or aortic aneurysm. 3. Stable three-vessel coronary artery calcifications. 4. Chronic underlying interstitial lung disease with superimposed lower lobe predominant extensive airspace process, more concerning for multifocal pneumonia or aspiration. 5. Stable mediastinal and hilar lymphadenopathy. 6. Cholelithiasis. Aortic Atherosclerosis (ICD10-I70.0) and Emphysema (ICD10-J43.9). Electronically Signed   By: Rudie Meyer M.D.   On: 06/21/2021 10:59   MR BRAIN WO CONTRAST  Result Date: 06/21/2021 CLINICAL DATA:  Acute neuro deficit. EXAM: MRI HEAD WITHOUT CONTRAST TECHNIQUE: Multiplanar, multiecho pulse sequences of the brain and surrounding structures were obtained without intravenous contrast. COMPARISON:  CT head 06/21/2021 FINDINGS: Brain: Negative for acute infarct. Ventricle size and cerebral volume within normal limits. Negative for hemorrhage or mass Chronic infarct left occipital parietal lobe.  Mild white matter changes bilaterally. Vascular: Normal arterial flow voids at the skull base Skull and upper cervical spine: No focal skull lesion. Sinuses/Orbits: Mild mucosal edema paranasal sinuses. Bilateral cataract extraction Other: None IMPRESSION: Negative for acute infarct Chronic infarct left occipital parietal lobe. Electronically Signed   By: Marlan Palau M.D.   On: 06/21/2021 15:56   CARDIAC CATHETERIZATION  Result Date: 06/25/2021   Prox RCA lesion is 40% stenosed.   RPDA lesion is 70% stenosed.   Ost Cx to Prox Cx lesion is 75% stenosed.   1st Mrg  lesion is 70% stenosed.   Prox LAD lesion is 95% stenosed.   Ost LM to Mid LM lesion is 60% stenosed.   There is mild left ventricular systolic dysfunction.   The left ventricular ejection fraction is 45-50% by visual estimate. 1.  Non-STEMI 2.  Severe two-vessel coronary artery disease with complex 95% calcified stenosis proximal LAD involving takeoff of large D1, 60% stenosis left main, 75% stenosis ostial left circumflex 3.  Preserved left ventricular function Recommendations Transfer to [facility] for CABG   DG Chest Port 1 View  Result Date: 07/20/2021 CLINICAL DATA:  Endotracheal tube adjustment EXAM: PORTABLE CHEST 1 VIEW COMPARISON:  07/20/2021 FINDINGS: Endotracheal tube is approximately 6 cm above the carina. Enteric tube is present. Temperature probe is present. Persistent right greater than left pulmonary opacities. Aeration is slightly improved. No significant pleural effusion. No pneumothorax. Stable cardiomediastinal contours. IMPRESSION: Lines and tubes as above. Persistent right greater than left pulmonary opacities with slight improvement in aeration. Electronically Signed   By: Macy Mis M.D.   On: 07/20/2021 08:24   DG Chest Port 1 View  Result Date: 07/20/2021 CLINICAL DATA:  Status post intubation. EXAM: PORTABLE CHEST 1 VIEW COMPARISON:  07/16/2021 FINDINGS: 0543 hours. Endotracheal tube tip is approximately 4.1 cm above  the base of the carina. The NG tube passes into the stomach although the distal tip position is not included on the film. Esophageal temperature probe evident. Similar low volume film. The cardio pericardial silhouette is enlarged. Diffuse airspace disease in the right lung is similar to prior as is the patchy left upper lobe airspace opacity and more consolidative disease in the left lower lung. No substantial pleural effusion. Telemetry leads overlie the chest. IMPRESSION: 1. Endotracheal tube tip is approximately 4.1 cm above the base of the carina. 2. Persistent bilateral airspace disease, left greater than right and not substantially changed. Electronically Signed   By: Misty Stanley M.D.   On: 07/20/2021 06:15   DG Chest Port 1 View  Result Date: 06/30/2021 CLINICAL DATA:  Question sepsis EXAM: PORTABLE CHEST 1 VIEW COMPARISON:  06/21/2021 FINDINGS: Previous median sternotomy, CABG and atrial clip. Worsening low pulmonary density throughout the right lung and in the left lower lung suggesting pneumonia. Asymmetric edema could have a similar pattern. No dense consolidation or lobar collapse. No visible effusion. IMPRESSION: Increasing pulmonary opacity throughout the right lung and in the left lower lobe that could represent developing pneumonia or asymmetric edema. Electronically Signed   By: Nelson Chimes M.D.   On: 07/18/2021 17:59   ECHOCARDIOGRAM COMPLETE  Result Date: 07/20/2021    ECHOCARDIOGRAM REPORT   Patient Name:   AVEYON NURSE Date of Exam: 07/20/2021 Medical Rec #:  [id number]     Height:       74.0 in Accession #:    [id number]    Weight:       209.0 lb Date of Birth:  Apr 28, 1945     BSA:          2.215 m Patient Age:    54 years      BP:           101/56 mmHg Patient Gender: M             HR:           70 bpm. Exam Location:  [facility] Procedure: 2D Echo, Color Doppler and Cardiac Doppler Indications:     I42.9 Cardiomyopathy-unspecified  History:  Patient has prior history of  Echocardiogram examinations, most                  recent 06/22/2021. Prior CABG; Risk Factors:Hypertension, HCL                  and Diabetes.  Sonographer:     Charmayne Sheer Referring Phys:  [id number] BRITTON L [place] Diagnosing Phys: Donnelly Angelica  Sonographer Comments: Echo performed with patient supine and on artificial respirator. IMPRESSIONS  1. Left ventricular ejection fraction, by estimation, is 45 to 50%. Left ventricular ejection fraction by 2D MOD biplane is 51.0 %. The left ventricle has mildly decreased function. The left ventricle has no regional wall motion abnormalities. There is mild left ventricular hypertrophy. Left ventricular diastolic parameters are consistent with Grade II diastolic dysfunction (pseudonormalization). There is the interventricular septum is flattened in diastole ('D' shaped left ventricle), consistent with right ventricular volume overload.  2. Right ventricular systolic function is mildly reduced. The right ventricular size is mildly enlarged. There is moderately elevated pulmonary artery systolic pressure.  3. The mitral valve is normal in structure. Mild to moderate mitral valve regurgitation. No evidence of mitral stenosis.  4. Tricuspid valve regurgitation is moderate.  5. The aortic valve is grossly normal. Aortic valve regurgitation is not visualized. Aortic valve sclerosis is present, with no evidence of aortic valve stenosis.  6. Aortic dilatation noted. There is mild dilatation of the ascending aorta, measuring 43 mm. Comparison(s): LV function is slightly reduced, and RV pressure is more elevated. FINDINGS  Left Ventricle: Left ventricular ejection fraction, by estimation, is 45 to 50%. Left ventricular ejection fraction by 2D MOD biplane is 51.0 %. The left ventricle has mildly decreased function. The left ventricle has no regional wall motion abnormalities. The left ventricular internal cavity size was normal in size. There is mild left ventricular hypertrophy.  Abnormal (paradoxical) septal motion consistent with post-operative status and the interventricular septum is flattened in diastole ('D' shaped left ventricle), consistent with right ventricular volume overload. Left ventricular diastolic parameters are consistent with Grade II diastolic dysfunction (pseudonormalization). Right Ventricle: The right ventricular size is mildly enlarged. Right vetricular wall thickness was not well visualized. Right ventricular systolic function is mildly reduced. There is moderately elevated pulmonary artery systolic pressure. The tricuspid  regurgitant velocity is 3.71 m/s, and with an assumed right atrial pressure of 5 mmHg, the estimated right ventricular systolic pressure is 0000000 mmHg. Left Atrium: Left atrial size was normal in size. Right Atrium: Right atrial size was normal in size. Pericardium: There is no evidence of pericardial effusion. Mitral Valve: The mitral valve is normal in structure. Mild to moderate mitral valve regurgitation. No evidence of mitral valve stenosis. MV peak gradient, 4.8 mmHg. The mean mitral valve gradient is 2.0 mmHg. Tricuspid Valve: The tricuspid valve is normal in structure. Tricuspid valve regurgitation is moderate. Aortic Valve: The aortic valve is grossly normal. Aortic valve regurgitation is not visualized. Aortic valve sclerosis is present, with no evidence of aortic valve stenosis. Aortic valve mean gradient measures 7.0 mmHg. Aortic valve peak gradient measures 13.7 mmHg. Aortic valve area, by VTI measures 2.82 cm. Pulmonic Valve: The pulmonic valve was normal in structure. Pulmonic valve regurgitation is mild. No evidence of pulmonic stenosis. Aorta: Aortic dilatation noted. There is mild dilatation of the ascending aorta, measuring 43 mm. Venous: IVC assessment for right atrial pressure unable to be performed due to mechanical ventilation. IAS/Shunts: No atrial level shunt detected by color flow  Doppler.  LEFT VENTRICLE PLAX 2D                         Biplane EF (MOD) LVIDd:         4.71 cm         LV Biplane EF:   Left LVIDs:         3.79 cm                          ventricular LV PW:         1.33 cm                          ejection LV IVS:        1.01 cm                          fraction by LVOT diam:     2.50 cm                          2D MOD LV SV:         94                               biplane is LV SV Index:   42                               51.0 %. LVOT Area:     4.91 cm                                Diastology                                LV e' medial:    5.87 cm/s LV Volumes (MOD)               LV E/e' medial:  17.9 LV vol d, MOD    107.0 ml      LV e' lateral:   9.36 cm/s A2C:                           LV E/e' lateral: 11.2 LV vol d, MOD    121.0 ml A4C: LV vol s, MOD    54.3 ml A2C: LV vol s, MOD    58.8 ml A4C: LV SV MOD A2C:   52.7 ml LV SV MOD A4C:   121.0 ml LV SV MOD BP:    58.7 ml RIGHT VENTRICLE RV Basal diam:  3.15 cm RV S prime:     7.29 cm/s LEFT ATRIUM             Index        RIGHT ATRIUM           Index LA diam:        4.30 cm 1.94 cm/m   RA Area:     15.60 cm LA Vol (A2C):   53.3 ml 24.07 ml/m  RA Volume:   33.80 ml  15.26 ml/m LA Vol (A4C):   59.0 ml 26.64 ml/m LA Biplane  Vol: 59.9 ml 27.05 ml/m  AORTIC VALVE                     PULMONIC VALVE AV Area (Vmax):    2.68 cm      PV Vmax:          1.12 m/s AV Area (Vmean):   2.34 cm      PV Vmean:         71.100 cm/s AV Area (VTI):     2.82 cm      PV VTI:           0.191 m AV Vmax:           185.00 cm/s   PV Peak grad:     5.0 mmHg AV Vmean:          130.000 cm/s  PV Mean grad:     2.0 mmHg AV VTI:            0.333 m       PR End Diast Vel: 15.68 msec AV Peak Grad:      13.7 mmHg AV Mean Grad:      7.0 mmHg LVOT Vmax:         101.00 cm/s LVOT Vmean:        62.000 cm/s LVOT VTI:          0.191 m LVOT/AV VTI ratio: 0.57  AORTA Ao Root diam: 3.70 cm MITRAL VALVE                TRICUSPID VALVE MV Area (PHT): 4.31 cm     TR Peak grad:   55.1 mmHg MV Area VTI:    3.21 cm     TR Vmax:        371.00 cm/s MV Peak grad:  4.8 mmHg MV Mean grad:  2.0 mmHg     SHUNTS MV Vmax:       1.10 m/s     Systemic VTI:  0.19 m MV Vmean:      65.2 cm/s    Systemic Diam: 2.50 cm MV Decel Time: 176 msec MR Peak grad: 87.2 mmHg MR Vmax:      467.00 cm/s MV E velocity: 105.00 cm/s MV A velocity: 34.70 cm/s MV E/A ratio:  3.03 Donnelly Angelica Electronically signed by Donnelly Angelica Signature Date/Time: 07/20/2021/1:34:48 PM    Final    ECHOCARDIOGRAM COMPLETE  Result Date: 06/23/2021    ECHOCARDIOGRAM REPORT   Patient Name:   CHARLE PAPARELLA Date of Exam: 06/22/2021 Medical Rec #:  [id number]     Height:       74.0 in Accession #:    [id number]    Weight:       200.0 lb Date of Birth:  03/29/1945     BSA:          2.173 m Patient Age:    72 years      BP:           119/62 mmHg Patient Gender: M             HR:           71 bpm. Exam Location:  [facility] Procedure: 2D Echo, Cardiac Doppler and Color Doppler Indications:     NSTEMI I21.4  History:         Patient has prior history of Echocardiogram examinations, most  recent 04/23/2016. Arrythmias:Atrial Fibrillation; Risk                  Factors:Diabetes and Hypertension.  Sonographer:     Sherrie Sport Referring Phys:  [id number] [place] TANG Diagnosing Phys: Isaias Cowman MD IMPRESSIONS  1. Left ventricular ejection fraction, by estimation, is 55 to 60%. The left ventricle has normal function. The left ventricle has no regional wall motion abnormalities. Left ventricular diastolic parameters were normal.  2. Right ventricular systolic function is normal. The right ventricular size is normal.  3. The mitral valve is normal in structure. Moderate mitral valve regurgitation. No evidence of mitral stenosis.  4. The aortic valve is normal in structure. Aortic valve regurgitation is mild. Mild aortic valve stenosis.  5. The inferior vena cava is normal in size with greater than 50% respiratory variability, suggesting right atrial pressure of 3  mmHg. FINDINGS  Left Ventricle: Left ventricular ejection fraction, by estimation, is 55 to 60%. The left ventricle has normal function. The left ventricle has no regional wall motion abnormalities. The left ventricular internal cavity size was normal in size. There is  no left ventricular hypertrophy. Left ventricular diastolic parameters were normal. Right Ventricle: The right ventricular size is normal. No increase in right ventricular wall thickness. Right ventricular systolic function is normal. Left Atrium: Left atrial size was normal in size. Right Atrium: Right atrial size was normal in size. Pericardium: There is no evidence of pericardial effusion. Mitral Valve: The mitral valve is normal in structure. Moderate mitral valve regurgitation. No evidence of mitral valve stenosis. MV peak gradient, 5.6 mmHg. The mean mitral valve gradient is 3.0 mmHg. Tricuspid Valve: The tricuspid valve is normal in structure. Tricuspid valve regurgitation is mild . No evidence of tricuspid stenosis. Aortic Valve: The aortic valve is normal in structure. Aortic valve regurgitation is mild. Mild aortic stenosis is present. Aortic valve mean gradient measures 7.7 mmHg. Aortic valve peak gradient measures 12.4 mmHg. Aortic valve area, by VTI measures 2.04 cm. Pulmonic Valve: The pulmonic valve was normal in structure. Pulmonic valve regurgitation is not visualized. No evidence of pulmonic stenosis. Aorta: The aortic root is normal in size and structure. Venous: The inferior vena cava is normal in size with greater than 50% respiratory variability, suggesting right atrial pressure of 3 mmHg. IAS/Shunts: No atrial level shunt detected by color flow Doppler.  LEFT VENTRICLE PLAX 2D LVIDd:         4.60 cm      Diastology LVIDs:         2.90 cm      LV e' medial:    6.31 cm/s LV PW:         1.00 cm      LV E/e' medial:  17.4 LV IVS:        1.20 cm      LV e' lateral:   7.40 cm/s LVOT diam:     2.00 cm      LV E/e' lateral: 14.9 LV  SV:         63 LV SV Index:   29 LVOT Area:     3.14 cm  LV Volumes (MOD) LV vol d, MOD A2C: 102.0 ml LV vol d, MOD A4C: 161.0 ml LV vol s, MOD A2C: 63.5 ml LV vol s, MOD A4C: 91.7 ml LV SV MOD A2C:     38.5 ml LV SV MOD A4C:     161.0 ml LV SV MOD BP:  54.8 ml RIGHT VENTRICLE RV S prime:     14.50 cm/s TAPSE (M-mode): 3.6 cm LEFT ATRIUM              Index        RIGHT ATRIUM           Index LA diam:        5.00 cm  2.30 cm/m   RA Area:     28.30 cm LA Vol (A2C):   84.2 ml  38.74 ml/m  RA Volume:   89.80 ml  41.32 ml/m LA Vol (A4C):   100.0 ml 46.01 ml/m LA Biplane Vol: 92.1 ml  42.38 ml/m  AORTIC VALVE                     PULMONIC VALVE AV Area (Vmax):    1.80 cm      PV Vmax:        0.96 m/s AV Area (Vmean):   1.67 cm      PV Vmean:       70.800 cm/s AV Area (VTI):     2.04 cm      PV VTI:         0.173 m AV Vmax:           176.33 cm/s   PV Peak grad:   3.7 mmHg AV Vmean:          124.233 cm/s  PV Mean grad:   2.0 mmHg AV VTI:            0.307 m       RVOT Peak grad: 3 mmHg AV Peak Grad:      12.4 mmHg AV Mean Grad:      7.7 mmHg LVOT Vmax:         101.00 cm/s LVOT Vmean:        66.200 cm/s LVOT VTI:          0.200 m LVOT/AV VTI ratio: 0.65  AORTA Ao Root diam: 3.50 cm MITRAL VALVE                TRICUSPID VALVE MV Area (PHT): 3.43 cm     TR Peak grad:   32.7 mmHg MV Area VTI:   1.96 cm     TR Vmax:        286.00 cm/s MV Peak grad:  5.6 mmHg MV Mean grad:  3.0 mmHg     SHUNTS MV Vmax:       1.18 m/s     Systemic VTI:  0.20 m MV Vmean:      83.7 cm/s    Systemic Diam: 2.00 cm MV Decel Time: 221 msec     Pulmonic VTI:  0.168 m MV E velocity: 110.00 cm/s MV A velocity: 98.50 cm/s MV E/A ratio:  1.12 Isaias Cowman MD Electronically signed by Isaias Cowman MD Signature Date/Time: 06/23/2021/1:23:11 PM    Final      TELEMETRY reviewed by me: Initially A-fib with RVR with rates 140s-160s, converted to sinus rhythm postintubation with rates in the 60s.  EKG reviewed by me: initially sinus  rhythm LAFB, AF RVR rate 140s  ASSESSMENT AND PLAN:  Alan Stafford is a 76 year old male with a past medical history of NSTEMI s/p recent CABG x3 (SVG-OM-diagonal, LIMA-LAD) and left atrial appendage ligation (0000000, Dr. Silvio Pate), HFpEF (0000000 LVEF 55-60%, w/o WMA's  moderate MR), paroxysmal atrial fibrillation/flutter s/p ablation (2012, previously on flecainide, recently started on amiodarone), bilateral carotid artery  stenosis, hypertension, hyperlipidemia, type 2 diabetes, hyperthyroidism on MMI, CKD 3, COPD, CVA, chronic pain who presented to [facility] ED from short-term rehab on 07/05/2021 with shortness of breath and productive cough that has been worsening over the past week failing outpatient antibiotic treatment for presumed pneumonia that was prescribed at rehab.  He was admitted to stepdown on BiPAP, but decompensated and required intubation overnight. Was in A-fib with RVR, now in sinus. Hypotensive, started on phenylephrine. Cardiology is consulted for assistance with his heart failure.   #Acute hypoxic respiratory failure 2/2 suspected pneumonia  #Acute on chronic HFpEF (LVEF 55-60%, moderate MR) The patient is ~3 weeks postop CABG x3 with a 2-week hospital stay coming from rehab facility.  His family notes he initially was feeling well and stronger for the first week of rehab but noted the past 4 days he became more short of breath.  He was prescribed antibiotics at his rehab facility for presumed pneumonia, but continued to require oxygen and have a productive cough, feeling weak.  He has leukocytosis to 17 but lactate is normal.  BNP is elevated to 1000 and chest x-ray shows significant opacity in the right lung throughout and left lower lung concerning for pneumonia or edema, but not significantly clinically volume overloaded appearing. -Agree with current therapy per PCCM -Continue vasopressor support, wean as tolerated -S/p a total of 80 mg IV Lasix since admission. -agree with lasix 20mg   IV Lasix BID with close monitoring of renal function - Echo shows mildly decreased EF, with elevated RV pressure.   #CAD s/p CABG x 3 on [id number] (Dr. Silvio Pate, [facility]) #Elevated troponin likely demand Prolonged hospital stay postoperatively complicated by AF with RVR significant deconditioning, was discharged to short-term rehab.  Troponin minimally elevated and flat trending 44-46-54, likely demand in the setting of respiratory distress and initial rapid heart rate. -Continue aspirin 81 mg daily -Continue Zetia, previously reported intolerance to statins, unclear of which statin he took. Consider retrial.  #Paroxysmal atrial fibrillation with RVR Came in with A-fib RVR rates in the 160s, rate improved and converted to sinus rhythm in the 60s-70s after intubation. -Was previously on flecainide, discontinued after concern for toxicity with QRS widening during his admission at [facility] and was started on amiodarone 200 mg once a day. -ok with holding amiodarone for now -Continue metoprolol tartrate 12.5 twice daily - On coumadin. INR being followed closely  #CKD 3 baseline between 1.5-1.8, GFR 40-45   Signed: Andrez Grime , MD 07/21/2021, 7:51 AM [facility]

## 2021-07-21 NOTE — Consult Note (Signed)
NAME:  Alan Stafford, MRN:  [id number], DOB:  12/15/1945, LOS: 2 ADMISSION DATE:  07/01/2021, CONSULTATION DATE:  07/20/21 REFERRING MD:  Dr. Nevada Crane, CHIEF COMPLAINT:  Shortness of breath   History of Present Illness:  76 year old male presenting to [facility] ED from rehab via EMS due to worsening shortness of breath, productive cough, acute oxygen requirement of 3.5 L nasal cannula that has been steadily increasing since 07/16/2021 and fatigue in the setting of recent pneumonia diagnosis.  Patient was prescribed antibiotics and prednisone in rehab for presumed pneumonia. Of note patient was recently discharged to rehab from the [facility] post CABG 06/27/2021.  Per daughter bedside she thinks her mom noted a low-grade fever when he was at the facility but no other complaints that she is aware of.  ED course: Vital signs stable on arrival, however patient remains on acute oxygen.  Imaging revealed worsening pulmonary opacities particularly in the right lung.  Lab work revealed elevated BNP with demand ischemia as troponin elevated but flat in the 40s.  Leukocytosis, however lactic WNL. Due to concern for worsening pneumonia while on antibiotics, TRH consulted for admission to inpatient. medications given: Cefepime, Lasix 20 mg, vancomycin Initial Vitals: 99 F, mildly tachypneic at 22, NSR 74, BP 119/67 and SPO2 96% on 4 L nasal cannula Significant labs: (Labs/ Imaging personally reviewed) I, Domingo Pulse [place], AGACNP-BC, personally viewed and interpreted this ECG. EKG Interpretation: Date: 06/25/2021, EKG Time: 17:09, Rate: 78, Rhythm: NSR, QRS Axis: LAD, Intervals: LAFB, ST/T Wave abnormalities: None, Narrative Interpretation: NSR with LAD & LAFB Chemistry: Na+: 134, K+: 4.3, BUN/Cr.:  27/1.71, Serum CO2/ AG: 22/9, AST: 84 Hematology: WBC: 12.3, Hgb: 8.5,  Troponin: 44> 46, BNP: 752.9, Lactic: 1.2 > 0.9, COVID-19 & Influenza A/B: Negative ABG: 7.42/38/57/24.6 CXR 07/18/2021: Increasing pulmonary opacity  throughout the right lung and in the left lower lobe that could represent developing pneumonia or asymmetric edema  Prior to moving inpatient patient's respiratory status declined, he had increased work of breathing somnolent but arousable to voice as and was placed on BiPAP support.  ABG reassuring, therefore transferred to stepdown unit on BiPAP.  TRH urgently consulted cardiology for assistance in management. PCCM consulted for assistance in management and monitoring due to worsening respiratory status with high risk for intubation. Upon arrival to ICU patient tachypneic, diaphoretic with increased work of breathing and A-fib RVR with heart rate sustaining in the 150s.  Patient also responsive but lethargic.  Daughter brought bedside explaining decision to emergently intubate and place patient on mechanical ventilatory support.   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  Upon bedside assessment patient acutely lethargic with increased work of breathing tachypnea and A-fib RVR-decision made to emergently intubate and place the patient on mechanical ventilatory support.  Interim History / Subjective:  Upon arrival to ICU patient tachypneic, diaphoretic with increased work of breathing and A-fib RVR with heart rate sustaining in the 150s.  Patient also responsive but lethargic.  Daughter brought bedside explaining decision to emergently intubate and place patient on mechanical ventilatory support.  Objective   Blood pressure (!) 85/59, pulse 67, temperature (!) 97 F (36.1 C), resp. rate 17, height 6\' 2"  (1.88 m),  weight 94.8  kg, SpO2 94 %.    Vent Mode: PCV FiO2 (%):  [35 %-40 %] 35 % Set Rate:  [10 bmp] 10 bmp PEEP:  [10 cmH20] 10 cmH20 Pressure Support:  [10 cmH20] 10 cmH20   Intake/Output Summary (Last 24 hours) at 07/21/2021 1220 Last data filed at 07/21/2021 [id number] Gross per 24 hour  Intake 1766.15 ml  Output 325 ml  Net 1441.15 ml    Filed Weights   07/13/2021 1714  Weight: 94.8 kg    Examination: General: Adult male, diaphoretic critically, lying in bed in respiratory distress HEENT: MM pink/moist, anicteric, atraumatic, neck supple Neuro: RASS: -1, able to follow intermittent commands, PERRL +3, MAE CV: s1s2 irregular, tachycardic, A-Fib RVR in 170's on monitor, no r/m/g Pulm: Regular, tachypneic & labored on BIPAP, breath sounds coarse crackles RUL, coarse- LUL, diminished with crackles-BLL GI: soft, rounded, non tender, bs x 4 GU: foley in place with clear yellow urine Skin: DTI Right calf, blanchable redness on sacrum Extremities: warm/dry, pulses + 2 R/P, no edema noted  Resolved Hospital Problem list     Assessment & Plan:  Acute Hypoxic Respiratory Failure secondary to HCAP and suspected pulmonary edema Chronic Interstitial Dz? PMHx: Asthma                           05/2021                                                                    04/2016 - Ventilator settings: PC-Automode 10/10 as tolerated - Wean FiO2 as tolerated, maintain SpO2 > 90% - Head of bed elevated 30 degrees, VAP protocol in place - Plateau pressures less than 30 cm H20  - Intermittent chest x-ray & ABG PRN - Daily WUA with SBT as tolerated  - Ensure adequate pulmonary hygiene  - F/u cultures, trend PCT - Continue HCAP coverage cefepime & linezolid - Bronchodilators PRN - Dilaudid in place of  Fentanyl while on Zyvox. Propofol drip. - Initiate peripheral phenylephrine drip in the setting of sedatives, wean as tolerated to maintain MAP > 65 - Review of several CT shows question of ongoing and chronic  infiltrates some of which GGO - Send serologies and start steroids for now.  - Hold amio - Caution with volume reduction as creatinine is rising.   Elevated BNP, suspect acute HFrEF exacerbation CAD status post recent CABG (06/2021) Atrial fibrillation with RVR Recent echo 06/14/2021 showed preserved LVEF and normal diastolic function.  A-fib RVR reverted to sinus rhythm post intubation and airway support. -Consider restarting outpatient amiodarone, per cardiology recommendation (stopped by [facility] due to concerns for ARDS & amnio toxicity) -Continue warfarin per pharmacy consult - Echocardiogram preserved EF, but RV is overloaded and septum is D shaped - Continuous cardiac monitoring  - Strict I/O's: alert provider if UOP < 0.5 mL/kg/hr - Daily weights to assess volume status - Diurese on hold for elevated creatinine   - Supplemental oxygen as needed, maintain SpO2 > 90% - Cardiology following, appreciate input  AKI on CKD stage IIIb - Daily BMP, replace electrolytes PRN - Avoid nephrotoxic agents as able, ensure adequate renal perfusion - Lokelma - Hold lasix, albumin dose - At risk for needing  CRRT -If creatinine rising tomorrow will ask nephrology to see  Hyperthyroidism -Continue outpatient regimen: Methimazole  - f/u TSH, T3, T4  Type 2 Diabetes Mellitus - Monitor CBG Q 4 hours - SSI moderate dosing - target range while in ICU: 140-180 - follow ICU hyper/hypo-glycemia protocol   Best Practice (right click and "Reselect all SmartList Selections" daily)  Diet/type: NPO w/ meds via tube DVT prophylaxis: DOAC GI prophylaxis: PPI Lines: N/A Foley:  Yes, and it is still needed Code Status:  full code Last date of multidisciplinary goals of care discussion [07/20/21]  Labs   CBC: Recent Labs  Lab 07/23/2021 1740 07/20/21 0610 07/21/21 0547  WBC 12.3* 16.7* 13.5*  NEUTROABS 10.5* 14.7* 12.4*  HGB 8.5* 8.7* 8.2*  HCT 28.0* 29.3* 29.1*  MCV 87.8 88.5 91.5  PLT 430* 467*  363     Basic Metabolic Panel: Recent Labs  Lab 06/30/2021 1740 07/20/21 0610 07/21/21 0547  NA 134* 137 135  K 4.3 4.6 6.1*  CL 103 105 101  CO2 22 23 23   GLUCOSE 144* 104* 220*  BUN 27* 30* 48*  CREATININE 1.71* 2.06* 3.06*  CALCIUM 8.3* 8.2* 8.3*  MG  --  1.9 2.3  PHOS  --  4.7* 8.7*    GFR: Estimated Creatinine Clearance: 24.3 mL/min (A) (by C-G formula based on SCr of 3.06 mg/dL (H)). Recent Labs  Lab 07/01/2021 1740 07/22/2021 2333 07/20/21 0610 07/21/21 0547  PROCALCITON  --   --  0.30  --   WBC 12.3*  --  16.7* 13.5*  LATICACIDVEN 1.2 0.9  --   --      Liver Function Tests: Recent Labs  Lab 07/17/2021 1740 07/20/21 0610  AST 84* 126*  ALT 40 54*  ALKPHOS 101 129*  BILITOT 0.9 1.6*  PROT 7.5 7.7  ALBUMIN 2.3* 2.3*    No results for input(s): LIPASE, AMYLASE in the last 168 hours. No results for input(s): AMMONIA in the last 168 hours.  ABG    Component Value Date/Time   PHART 7.32 (L) 07/20/2021 0619   PCO2ART 46 07/20/2021 0619   PO2ART 223 (H) 07/20/2021 0619   HCO3 23.7 07/20/2021 0619   ACIDBASEDEF 2.6 (H) 07/20/2021 0619   O2SAT 98.8 07/20/2021 0619      Coagulation Profile: Recent Labs  Lab 07/09/2021 1740 07/20/21 0610 07/21/21 0547  INR 2.6* 3.0* 2.8*     Cardiac Enzymes: No results for input(s): CKTOTAL, CKMB, CKMBINDEX, TROPONINI in the last 168 hours.  HbA1C: Hgb A1c MFr Bld  Date/Time Value Ref Range Status  07/20/2021 06:10 AM 6.3 (H) 4.8 - 5.6 % Final    Comment:    (NOTE) Pre diabetes:          5.7%-6.4%  Diabetes:              >6.4%  Glycemic control for   <7.0% adults with diabetes   04/23/2016 02:59 AM 6.2 (H) 4.8 - 5.6 % Final    Comment:    (NOTE)         Pre-diabetes: 5.7 - 6.4         Diabetes: >6.4         Glycemic control for adults with diabetes: <7.0     CBG: Recent Labs  Lab 07/20/21 2339 07/21/21 0404 07/21/21 0608 07/21/21 0755 07/21/21 1112  GLUCAP 113* 179* 214* 203* 209*      Review of Systems:   UTA- patient lethargic and in respiratory distress  Past Medical  History:  He,  has a past medical history of A-fib ([place]), Abnormal heart rhythm, Arthritis, Asthma, CAP (community acquired pneumonia) (04/22/2016), Diabetes mellitus without complication ([place]), Dysrhythmia, GERD (gastroesophageal reflux disease), Hemoptysis (04/22/2016), Hypercholesteremia, Hypertension, Hyperthyroidism, Kidney stones, and Sepsis ([employer]) (04/22/2016).   Surgical History:   Past Surgical History:  Procedure Laterality Date   CATARACT EXTRACTION W/PHACO Left 09/26/2016   Procedure: CATARACT EXTRACTION PHACO AND INTRAOCULAR LENS PLACEMENT (IOC);  Surgeon: Eulogio Bear, MD;  Location: [facility] ORS;  Service: Ophthalmology;  Laterality: Left;  [place] 00:52.0 AP% 17.0 CDE 8.86 Fluid pack lot # [id number]   EYE SURGERY Right 2015   cataract   JOINT REPLACEMENT Right    knee   KNEE SURGERY Right 2015   total knee replacement revision   LEFT HEART CATH AND CORONARY ANGIOGRAPHY N/A 06/25/2021   Procedure: LEFT HEART CATH AND CORONARY ANGIOGRAPHY;  Surgeon: Isaias Cowman, MD;  Location: [place] [facility];  Service: Cardiovascular;  Laterality: N/A;     Social History:   reports that he has never smoked. He uses smokeless tobacco. He reports that he does not drink alcohol and does not use drugs.   Family History:  His family history includes Dementia in his mother; Heart disease in his father.   Allergies Allergies  Allergen Reactions   Statins Other (See Comments)    Leg weakness   Sulfa Antibiotics Rash     Home Medications  Prior to Admission medications   Medication Sig Start Date End Date Taking? Authorizing Provider  albuterol (VENTOLIN HFA) 108 (90 Base) MCG/ACT inhaler Inhale into the lungs every 6 (six) hours as needed for wheezing or shortness of breath.   Yes [provider]  amiodarone (PACERONE) 200 MG tablet Take 200 mg by mouth daily.   Yes [provider]  aspirin EC 81 MG EC tablet Take 1 tablet (81 mg total) by mouth daily. Swallow whole. 06/25/21  Yes Wouk, Ailene Rud, MD  budesonide-formoterol [facility]) 160-4.5 MCG/ACT inhaler Inhale 2 puffs into the lungs 2 (two) times daily.   Yes [provider]  doxycycline (VIBRA-TABS) 100 MG tablet Take 100 mg by mouth 2 (two) times daily. 07/18/21 07/25/21 Yes [provider]  escitalopram (LEXAPRO) 20 MG tablet Take 20 mg by mouth daily.   Yes [provider]  ezetimibe (ZETIA) 10 MG tablet Take 10 mg by mouth daily.   Yes [provider]  furosemide (LASIX) 40 MG tablet Take 40 mg by mouth.   Yes [provider]  glipiZIDE (GLUCOTROL XL) 10 MG 24 hr tablet Take 10 mg by mouth daily with breakfast.   Yes [provider]  HYDROcodone-acetaminophen (NORCO) 10-325 MG tablet Take 1 tablet by mouth every 6 (six) hours as needed.   Yes [provider]  insulin glargine (LANTUS) 100 UNIT/ML injection Inject 20 Units into the skin daily.   Yes [provider]  metoprolol succinate (TOPROL-XL) 50 MG 24 hr tablet Take 150 mg by mouth daily. Take with or immediately following a meal.   Yes [provider]  Multiple Vitamins-Minerals (MULTIVITAMIN WITH MINERALS) tablet Take 1 tablet by mouth daily.   Yes [provider]  nystatin (MYCOSTATIN/NYSTOP) powder Apply 1 application. topically daily. Apply to groin topically every day and evening for rash   Yes [provider]  omeprazole (PRILOSEC) 20 MG capsule Take 20 mg by mouth 2 (two) times daily before a meal.   Yes [provider]  polyethylene glycol powder (GLYCOLAX/MIRALAX)  17 GM/SCOOP powder Take 17 g by mouth daily.   Yes [provider]  potassium chloride SA (KLOR-CON M) 20 MEQ tablet Take 20 mEq by mouth daily.   Yes [provider]  predniSONE (DELTASONE) 20 MG tablet Take 20 mg by mouth in the morning and at bedtime.  07/18/21 07/25/21 Yes [provider]  pregabalin (LYRICA) 25 MG capsule Take 1 capsule (25 mg total) by mouth 3 (three) times daily. 06/25/21  Yes Wouk, Ailene Rud, MD  sennosides-docusate sodium (SENOKOT-S) 8.6-50 MG tablet Take 2 tablets by mouth in the morning and at bedtime.   Yes [provider]  traZODone (DESYREL) 50 MG tablet Take 150 mg by mouth at bedtime as needed for sleep. 07/09/21 07/24/21 Yes [provider]  warfarin (COUMADIN) 3 MG tablet Take 3 mg by mouth daily. Take 1.5mg  once daily on Tuesdays,Thursdays,Sundays, and Saturdays Take 3mg  once daily on Mondays, Wednesdays, and Fridays   Yes [provider]  Ampicillin-Sulbactam 3 g in sodium chloride 0.9 % 100 mL Inject 3 g into the vein every 6 (six) hours. 06/25/21   Wouk, Ailene Rud, MD  methimazole (TAPAZOLE) 10 MG tablet Take 1 tablet (10 mg total) by mouth 2 (two) times daily. Patient taking differently: Take 10 mg by mouth daily. 06/25/21   Wouk, Ailene Rud, MD  metoprolol succinate (TOPROL-XL) 25 MG 24 hr tablet Take 1 tablet (25 mg total) by mouth daily. Patient not taking: Reported on 07/17/2021 06/25/21   Gwynne Edinger, MD  mometasone-formoterol [facility]) 200-5 MCG/ACT AERO Inhale 2 puffs into the lungs 2 (two) times daily. 06/25/21   Wouk, Ailene Rud, MD  pantoprazole (PROTONIX) 40 MG injection Inject 40 mg into the vein every 12 (twelve) hours. 06/25/21   Wouk, Ailene Rud, MD     Critical care time: 50 minutes

## 2021-07-21 NOTE — Progress Notes (Signed)
Peripherally Inserted Central Catheter Placement  The IV Nurse has discussed with the patient and/or persons authorized to consent for the patient, the purpose of this procedure and the potential benefits and risks involved with this procedure.  The benefits include less needle sticks, lab draws from the catheter, and the patient may be discharged home with the catheter. Risks include, but not limited to, infection, bleeding, blood clot (thrombus formation), and puncture of an artery; nerve damage and irregular heartbeat and possibility to perform a PICC exchange if needed/ordered by physician.  Alternatives to this procedure were also discussed.  Bard Power PICC patient education guide, fact sheet on infection prevention and patient information card has been provided to patient /or left at bedside.  Wife and 2 daughters at bedside signed consent.  PICC Placement Documentation  PICC Triple Lumen 07/21/21 Right Brachial 41 cm 1 cm (Active)  Indication for Insertion or Continuance of Line Vasoactive infusions 07/21/21 1839  Exposed Catheter (cm) 1 cm 07/21/21 1839  Site Assessment Clean, Dry, Intact 07/21/21 1839  Lumen #1 Status Flushed;Saline locked;Blood return noted 07/21/21 1839  Lumen #2 Status Flushed;Saline locked;Blood return noted 07/21/21 1839  Lumen #3 Status Flushed;Saline locked;Blood return noted 07/21/21 1839  Dressing Type Transparent;Securing device 07/21/21 1839  Dressing Status Antimicrobial disc in place;Clean, Dry, Intact 07/21/21 1839  Safety Lock Not Applicable 07/21/21 1839  Line Care Connections checked and tightened 07/21/21 1839  Line Adjustment (NICU/IV Team Only) No 07/21/21 1839  Dressing Intervention New dressing 07/21/21 1839  Dressing Change Due 07/28/21 07/21/21 1839       Elliot Dally 07/21/2021, 6:39 PM

## 2021-07-21 NOTE — Progress Notes (Addendum)
Rough day. Patient sweating profusely. Unable to keep IV taped to skin. Right forearm IV out with 1st turn of the shift. Restarted in left [facility] per charge nurse. Room cooled. Bath given early in shift. 2nd IV site  left hand  also discontinued per patient . Room chilled down and patient allowed to air dry from bath. Fan applied to circulate air. Patient asynchronous with the ventilator. Peak airway pressures in the 50's up from  20 this morning. Suctioned several times without success. Dr. Earlie Server in to see patient. 1100 Bronchoscopy at bedside. Given Midazolam 4 mg and bolus of 10 mcg of Propofol for procedure. Wife and daughter stayed for procedure. Copious amount of clear sticky sputum retrieved from lungs. Patient's peak airway pressures decreased to 20's. Respiratory effort also decreased. Daughters in to stay with patient. Wife went home to rest. 1400 Neosynephreine restarted at 5 mcg. for continued low Family informed that patient that patients' kidney function was not optimal. Dr. Earlie Server notified of decreased urine output and low body temperature. Patient not perspiring like this am. Hands and feet are warm to touch. Pulses peripherally unchanged.1730 IV team in to place PICC . FiO2 30% on the ventilator. Lung sounds remain clear since bronchoscopy this am. Family in and out.

## 2021-07-22 DIAGNOSIS — J9601 Acute respiratory failure with hypoxia: Secondary | ICD-10-CM | POA: Diagnosis not present

## 2021-07-22 DIAGNOSIS — I480 Paroxysmal atrial fibrillation: Secondary | ICD-10-CM | POA: Diagnosis not present

## 2021-07-22 DIAGNOSIS — J849 Interstitial pulmonary disease, unspecified: Secondary | ICD-10-CM

## 2021-07-22 DIAGNOSIS — I5023 Acute on chronic systolic (congestive) heart failure: Secondary | ICD-10-CM | POA: Diagnosis not present

## 2021-07-22 DIAGNOSIS — R0602 Shortness of breath: Secondary | ICD-10-CM | POA: Diagnosis not present

## 2021-07-22 LAB — COMPREHENSIVE METABOLIC PANEL
ALT: 47 U/L — ABNORMAL HIGH (ref 0–44)
AST: 80 U/L — ABNORMAL HIGH (ref 15–41)
Albumin: 2.3 g/dL — ABNORMAL LOW (ref 3.5–5.0)
Alkaline Phosphatase: 103 U/L (ref 38–126)
Anion gap: 11 (ref 5–15)
BUN: 70 mg/dL — ABNORMAL HIGH (ref 8–23)
CO2: 22 mmol/L (ref 22–32)
Calcium: 7.8 mg/dL — ABNORMAL LOW (ref 8.9–10.3)
Chloride: 103 mmol/L (ref 98–111)
Creatinine, Ser: 3.98 mg/dL — ABNORMAL HIGH (ref 0.61–1.24)
GFR, Estimated: 15 mL/min — ABNORMAL LOW (ref >60)
Glucose, Bld: 192 mg/dL — ABNORMAL HIGH (ref 70–99)
Potassium: 5.2 mmol/L — ABNORMAL HIGH (ref 3.5–5.1)
Sodium: 136 mmol/L (ref 135–145)
Total Bilirubin: 1.2 mg/dL (ref 0.3–1.2)
Total Protein: 7 g/dL (ref 6.5–8.1)

## 2021-07-22 LAB — CBC
HCT: 25.1 % — ABNORMAL LOW (ref 39.0–52.0)
Hemoglobin: 7.2 g/dL — ABNORMAL LOW (ref 13.0–17.0)
MCH: 26.1 pg (ref 26.0–34.0)
MCHC: 28.7 g/dL — ABNORMAL LOW (ref 30.0–36.0)
MCV: 90.9 fL (ref 80.0–100.0)
Platelets: 324 10*3/uL (ref 150–400)
RBC: 2.76 MIL/uL — ABNORMAL LOW (ref 4.22–5.81)
RDW: 16.7 % — ABNORMAL HIGH (ref 11.5–15.5)
WBC: 10.4 10*3/uL (ref 4.0–10.5)
nRBC: 1 % — ABNORMAL HIGH (ref 0.0–0.2)

## 2021-07-22 LAB — PHOSPHORUS: Phosphorus: 8.2 mg/dL — ABNORMAL HIGH (ref 2.5–4.6)

## 2021-07-22 LAB — GLUCOSE, CAPILLARY
Glucose-Capillary: 165 mg/dL — ABNORMAL HIGH (ref 70–99)
Glucose-Capillary: 176 mg/dL — ABNORMAL HIGH (ref 70–99)
Glucose-Capillary: 188 mg/dL — ABNORMAL HIGH (ref 70–99)
Glucose-Capillary: 193 mg/dL — ABNORMAL HIGH (ref 70–99)
Glucose-Capillary: 218 mg/dL — ABNORMAL HIGH (ref 70–99)
Glucose-Capillary: 231 mg/dL — ABNORMAL HIGH (ref 70–99)
Glucose-Capillary: 239 mg/dL — ABNORMAL HIGH (ref 70–99)

## 2021-07-22 LAB — CULTURE, RESPIRATORY W GRAM STAIN: Culture: NORMAL

## 2021-07-22 LAB — PROTIME-INR
INR: 2.9 — ABNORMAL HIGH (ref 0.8–1.2)
Prothrombin Time: 30.5 seconds — ABNORMAL HIGH (ref 11.4–15.2)

## 2021-07-22 LAB — MAGNESIUM: Magnesium: 2.3 mg/dL (ref 1.7–2.4)

## 2021-07-22 MED ORDER — SODIUM ZIRCONIUM CYCLOSILICATE 5 G PO PACK
10.0000 g | PACK | Freq: Two times a day (BID) | ORAL | Status: AC
  Administered 2021-07-22 – 2021-07-23 (×2): 10 g
  Filled 2021-07-22 (×2): qty 2

## 2021-07-22 MED ORDER — INSULIN ASPART 100 UNIT/ML IJ SOLN
0.0000 [IU] | INTRAMUSCULAR | Status: DC
  Administered 2021-07-23 (×4): 4 [IU] via SUBCUTANEOUS
  Administered 2021-07-23 – 2021-07-24 (×2): 7 [IU] via SUBCUTANEOUS
  Administered 2021-07-24 (×2): 3 [IU] via SUBCUTANEOUS
  Administered 2021-07-24: 7 [IU] via SUBCUTANEOUS
  Administered 2021-07-24: 3 [IU] via SUBCUTANEOUS
  Administered 2021-07-24: 4 [IU] via SUBCUTANEOUS
  Administered 2021-07-25: 3 [IU] via SUBCUTANEOUS
  Administered 2021-07-25: 4 [IU] via SUBCUTANEOUS
  Administered 2021-07-25 (×2): 3 [IU] via SUBCUTANEOUS
  Filled 2021-07-22 (×15): qty 1

## 2021-07-22 MED ORDER — WARFARIN SODIUM 1 MG PO TABS
1.0000 mg | ORAL_TABLET | Freq: Once | ORAL | Status: AC
  Administered 2021-07-22: 1 mg
  Filled 2021-07-22: qty 1

## 2021-07-22 MED ORDER — ALBUMIN HUMAN 5 % IV SOLN
25.0000 g | Freq: Once | INTRAVENOUS | Status: DC
  Filled 2021-07-22: qty 500

## 2021-07-22 NOTE — Progress Notes (Signed)
Secure chat sent to Myra RN to remove LAC PIV- not needed with triple lumen PICC in place to decrease risk of CLABSI.

## 2021-07-22 NOTE — Consult Note (Signed)
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 Patient is currently admitted to the ICU where he is intubated, sedated, and on mechanical ventilation.   Goal of Therapy:  Electrolytes within normal limits  Plan:  --Renal function worsening(SCR 1.71>2.06-->3.98) --K 5.2, improved s/p lokelma x 2. Will monitor non-pharmacologically currently --Phos 8.2, nephrology to be consulted. Will await decision regarding HD vs. CRRT. No pharmacologic intervention at this time. Will consult nephrology if scr continues to worsen --Follow-up electrolytes with AM labs tomorrow  Pearla Dubonnet 07/22/2021 8:34 AM

## 2021-07-22 NOTE — Consult Note (Signed)
[facility] Consult Note:07/22/21    Date of Admission:  07/07/2021           Reason for Consult:     Referring Provider: Nelle Don, MD Primary Care Provider: Raelene Bott, MD   History of Presenting Illness:  Alan Stafford is a 76 y.o. male  Presents from [facility] via EMS for e/m of shortness of breath. Recent hx of CABG at [facility]. Required oxygen by [place] but requirements increasing. Admitted for concerns of worsening pneumonia.  Hospital course is complicated by worsening respiratory status, A-fib RVR, requirements for BiPAP.  Ultimately patient ended up requiring intubation and was placed on mechanical ventilatory support on 5/27.  He was also noted to be hypotensive. Nephrology consult has been requested for evaluation of worsening  renal failure. Urine output reported at 500 cc. Serum creatinine trends reviewed.  Creatinine has now increased to 3.98. Baseline creatinine 1.6 from Jun 25, 2021.  Corresponding GFR 45.  Pressor support with norepinephrine Ventilator: FiO2 28%, PEEP 10  Review of Systems: ROS - not available as patient is intubated and sedated  Past Medical History:  Diagnosis Date   A-fib ([place])    Abnormal heart rhythm    Arthritis    Asthma    CAP (community acquired pneumonia) 04/22/2016   Diabetes mellitus without complication (HCC)    Dysrhythmia    afib treated with flecainide   GERD (gastroesophageal reflux disease)    Hemoptysis 04/22/2016   Hypercholesteremia    Hypertension    Hyperthyroidism    Kidney stones    Sepsis ([place]) 04/22/2016    Social History   Tobacco Use   Smoking status: Never   Smokeless tobacco: Current   Tobacco comments:    uses dip a little  Vaping Use   Vaping Use: Never used  Substance Use Topics   Alcohol use: No    Alcohol/week: 0.0 standard drinks   Drug use: No    Family History  Problem Relation Age of Onset   Dementia Mother    Heart disease Father       OBJECTIVE: Blood pressure 126/67, pulse 69, temperature 98.2 F (36.8 C), resp. rate 10, height $RemoveBe'6\' 2"'XYbOjIDRC$  (1.88 m), weight 94.8 kg, SpO2 94 %.  Physical Exam Physical Exam: General:  Critically ill appearing, laying in the bed  HEENT  anicteric, ETT in place  Pulm/lungs  Vent assisted.  CVS/Heart  irregular rhythm,   Abdomen:   Soft, nontender  Extremities:  No peripheral edema  Neurologic:  sedated  Skin:  No acute rashes     Lab Results Lab Results  Component Value Date   WBC 10.4 07/22/2021   HGB 7.2 (L) 07/22/2021   HCT 25.1 (L) 07/22/2021   MCV 90.9 07/22/2021   PLT 324 07/22/2021    Lab Results  Component Value Date   CREATININE 3.98 (H) 07/22/2021   BUN 70 (H) 07/22/2021   NA 136 07/22/2021   K 5.2 (H) 07/22/2021   CL 103 07/22/2021   CO2 22 07/22/2021    Lab Results  Component Value Date   ALT 47 (H) 07/22/2021   AST 80 (H) 07/22/2021   ALKPHOS 103 07/22/2021   BILITOT 1.2 07/22/2021     Microbiology: Recent Results (from the past 240 hour(s))  Blood Culture (routine x 2)     Status: None (Preliminary result)   Collection Time: 07/21/2021  5:55 PM   Specimen: BLOOD  Result Value Ref Range Status   Specimen  Description BLOOD RIGHT FOREARM  Final   Special Requests   Final    BOTTLES DRAWN AEROBIC AND ANAEROBIC Blood Culture results may not be optimal due to an inadequate volume of blood received in culture bottles   Culture   Final    NO GROWTH 3 DAYS Performed at [facility], [address] [place], [place] [address]    Report Status PENDING  Incomplete  Blood Culture (routine x 2)     Status: None (Preliminary result)   Collection Time: 07/08/2021  5:58 PM   Specimen: BLOOD  Result Value Ref Range Status   Specimen Description BLOOD RIGHT FOREARM  Final   Special Requests   Final    BOTTLES DRAWN AEROBIC AND ANAEROBIC Blood Culture adequate volume   Culture   Final    NO GROWTH 3 DAYS Performed at [facility], [address], [place], [place] [address]    Report Status PENDING  Incomplete  SARS Coronavirus 2 by RT PCR (hospital order, performed in [place] hospital lab) *cepheid single result test* Anterior Nasal Swab     Status: None   Collection Time: 07/22/2021  6:57 PM   Specimen: Anterior Nasal Swab  Result Value Ref Range Status   SARS Coronavirus 2 by RT PCR NEGATIVE NEGATIVE Final    Comment: (NOTE) SARS-CoV-2 target nucleic acids are NOT DETECTED.  The SARS-CoV-2 RNA is generally detectable in upper and lower respiratory specimens during the acute phase of infection. The lowest concentration of SARS-CoV-2 viral copies this assay can detect is 250 copies / mL. A negative result does not preclude SARS-CoV-2 infection and should not be used as the sole basis for treatment or other patient management decisions.  A negative result may occur with improper specimen collection / handling, submission of specimen other than nasopharyngeal swab, presence of viral mutation(s) within the areas targeted by this assay, and inadequate number of viral copies (<250 copies / mL). A negative result must be combined with clinical observations, patient history, and epidemiological information.  Fact Sheet for Patients:   https://www.patel.info/  Fact Sheet for Healthcare Providers: https://hall.com/  This test is not yet approved or  cleared by the [place] FDA and has been authorized for detection and/or diagnosis of SARS-CoV-2 by FDA under an Emergency Use Authorization (EUA).  This EUA will remain in effect (meaning this test can be used) for the duration of the COVID-19 declaration under Section 564(b)(1) of the Act, 21 U.S.C. section 360bbb-3(b)(1), unless the authorization is terminated or revoked sooner.  Performed at [facility], [address] [place], [place] [address]   MRSA Next Gen by PCR, Nasal     Status: Abnormal    Collection Time: 07/08/2021 11:33 PM   Specimen: Nasal Swab  Result Value Ref Range Status   MRSA by PCR Next Gen DETECTED (A) NOT DETECTED Final    Comment: RESULT CALLED TO, READ BACK BY AND VERIFIED WITH: ANTACIA BELL $RemoveBefo'@[id number]$  ON5/26/23 SKL (NOTE) The GeneXpert MRSA Assay (FDA approved for NASAL specimens only), is one component of a comprehensive MRSA colonization surveillance program. It is not intended to diagnose MRSA infection nor to guide or monitor treatment for MRSA infections. Test performance is not FDA approved in patients less than 23 years old. Performed at [facility], [address] [place], [place] [address]   Culture, Respiratory w Gram Stain     Status: None   Collection Time: 07/20/21  6:19 AM   Specimen: Tracheal  Aspirate; Respiratory  Result Value Ref Range Status   Specimen Description   Final    TRACHEAL ASPIRATE Performed at [facility], [address] [place], [place] [address]    Special Requests   Final    NONE Performed at [facility], [address] [place], [place] [address]    Gram Stain   Final    FEW WBC PRESENT, PREDOMINANTLY PMN NO ORGANISMS SEEN    Culture   Final    RARE Normal respiratory flora-no Staph aureus or Pseudomonas seen Performed at [facility] [address] [place], [place] [address]    Report Status 07/22/2021 FINAL  Final    Medications: Scheduled Meds:  aspirin  81 mg Per Tube Daily   chlorhexidine gluconate (MEDLINE KIT)  15 mL Mouth Rinse BID   Chlorhexidine Gluconate Cloth  6 each Topical Daily   Chlorhexidine Gluconate Cloth  6 each Topical Q0600   docusate  100 mg Per Tube BID   escitalopram  20 mg Per Tube Daily   ezetimibe  10 mg Per Tube Daily   insulin aspart  0-15 Units Subcutaneous Q4H   mouth rinse  15 mL Mouth Rinse 10 times per day   methimazole  10 mg Per Tube Daily   methylPREDNISolone (SOLU-MEDROL) injection  120 mg Intravenous Q12H   metoprolol tartrate   12.5 mg Per Tube BID   mupirocin ointment  1 application. Nasal BID   pantoprazole sodium  40 mg Per Tube Daily   polyethylene glycol  17 g Per Tube Daily   pregabalin  25 mg Per Tube TID   sodium chloride flush  10-40 mL Intracatheter Q12H   warfarin  1 mg Per Tube ONCE-1600   Warfarin - Pharmacist Dosing Inpatient   Does not apply q1600   Continuous Infusions:  ceFEPime (MAXIPIME) IV     fentaNYL infusion INTRAVENOUS Stopped (07/21/21 1000)   linezolid (ZYVOX) IV Stopped (07/21/21 2204)   phenylephrine (NEO-SYNEPHRINE) Adult infusion 18 mcg/min (07/22/21 0705)   propofol (DIPRIVAN) infusion 50 mcg/kg/min (07/22/21 0702)   PRN Meds:.acetaminophen **OR** acetaminophen, albuterol, hydrALAZINE, HYDROcodone-acetaminophen, HYDROmorphone (DILAUDID) injection, midazolam, prochlorperazine, sodium chloride flush  Allergies  Allergen Reactions   Statins Other (See Comments)    Leg weakness   Sulfa Antibiotics Rash    Urinalysis: Recent Labs    07/18/2021 2332  COLORURINE YELLOW*  LABSPEC 1.013  PHURINE 5.0  GLUCOSEU NEGATIVE  HGBUR NEGATIVE  BILIRUBINUR NEGATIVE  KETONESUR NEGATIVE  PROTEINUR NEGATIVE  NITRITE NEGATIVE  LEUKOCYTESUR NEGATIVE      Imaging: DG Abd 1 View  Result Date: 07/21/2021 CLINICAL DATA:  Endotracheal tube adjusted. Nasogastric tube placement. EXAM: PORTABLE CHEST 1 VIEW COMPARISON:  AP chest 07/21/2021 at 0836 hours; upper abdominal radiograph 07/20/2021 FINDINGS: AP chest 07/21/2021 at 951 hours: Endotracheal tube tip terminates approximately 3.0 cm above the carina, appearing at the superior aspect of the clavicular heads. This appears grossly similar to prior. Measurement tool on the current radiographs is not calibrated, and the tip of the tube measures approximately 2.0 cm from the carina compared to 4.5 cm previously however again these measurements are likely non comparable. Enteric tube again descends below the diaphragm with the tip excluded by  collimation. Status post median sternotomy and CABG. Atrial clipping device is again seen. There is again extensive bilateral interstitial thickening and airspace density. No pneumothorax is seen. -- KUB 07/21/2021 at 0952 hours: Enteric tube descends below the diaphragm and curls along the greater curvature  of the stomach with the tip overlying the distal stomach. Nonobstructed bowel-gas pattern. IMPRESSION: The endotracheal tube tip again appears to be at the superior aspect of the clavicular heads. No significant change in diffuse airspace disease. Nasogastric tube tip overlies the distal stomach. Electronically Signed   By: Yvonne Kendall M.D.   On: 07/21/2021 22:09   DG Chest Port 1 View  Result Date: 07/21/2021 CLINICAL DATA:  Endotracheal tube adjusted. Nasogastric tube placement. EXAM: PORTABLE CHEST 1 VIEW COMPARISON:  AP chest 07/21/2021 at 0836 hours; upper abdominal radiograph 07/20/2021 FINDINGS: AP chest 07/21/2021 at 951 hours: Endotracheal tube tip terminates approximately 3.0 cm above the carina, appearing at the superior aspect of the clavicular heads. This appears grossly similar to prior. Measurement tool on the current radiographs is not calibrated, and the tip of the tube measures approximately 2.0 cm from the carina compared to 4.5 cm previously however again these measurements are likely non comparable. Enteric tube again descends below the diaphragm with the tip excluded by collimation. Status post median sternotomy and CABG. Atrial clipping device is again seen. There is again extensive bilateral interstitial thickening and airspace density. No pneumothorax is seen. -- KUB 07/21/2021 at 0952 hours: Enteric tube descends below the diaphragm and curls along the greater curvature of the stomach with the tip overlying the distal stomach. Nonobstructed bowel-gas pattern. IMPRESSION: The endotracheal tube tip again appears to be at the superior aspect of the clavicular heads. No significant  change in diffuse airspace disease. Nasogastric tube tip overlies the distal stomach. Electronically Signed   By: Yvonne Kendall M.D.   On: 07/21/2021 22:09   DG Chest Port 1 View  Result Date: 07/21/2021 CLINICAL DATA:  Altered mental status EXAM: PORTABLE CHEST 1 VIEW COMPARISON:  07/20/2021 FINDINGS: Endotracheal tube with tip at the clavicular heads. The enteric tube reaches the stomach. Extensive artifact from EKG leads. Extensive bilateral interstitial and airspace density. Cardiomegaly with prior CABG and left atrial clipping. IMPRESSION: Stable hardware positioning and bilateral airspace disease. Electronically Signed   By: Jorje Guild M.D.   On: 07/21/2021 08:55   ECHOCARDIOGRAM COMPLETE  Result Date: 07/20/2021    ECHOCARDIOGRAM REPORT   Patient Name:   THERAN VANDERGRIFT Date of Exam: 07/20/2021 Medical Rec #:  [id number]     Height:       74.0 in Accession #:    [id number]    Weight:       209.0 lb Date of Birth:  [birth year]     BSA:          2.215 m Patient Age:    77 years      BP:           101/56 mmHg Patient Gender: M             HR:           70 bpm. Exam Location:  [facility] Procedure: 2D Echo, Color Doppler and Cardiac Doppler Indications:     I42.9 Cardiomyopathy-unspecified  History:         Patient has prior history of Echocardiogram examinations, most                  recent 06/22/2021. Prior CABG; Risk Factors:Hypertension, HCL                  and Diabetes.  Sonographer:     Charmayne Sheer Referring Phys:  [id number] BRITTON L [place] Diagnosing Phys: Donnelly Angelica  Sonographer Comments: Echo performed with  patient supine and on artificial respirator. IMPRESSIONS  1. Left ventricular ejection fraction, by estimation, is 45 to 50%. Left ventricular ejection fraction by 2D MOD biplane is 51.0 %. The left ventricle has mildly decreased function. The left ventricle has no regional wall motion abnormalities. There is mild left ventricular hypertrophy. Left ventricular diastolic parameters are  consistent with Grade II diastolic dysfunction (pseudonormalization). There is the interventricular septum is flattened in diastole ('D' shaped left ventricle), consistent with right ventricular volume overload.  2. Right ventricular systolic function is mildly reduced. The right ventricular size is mildly enlarged. There is moderately elevated pulmonary artery systolic pressure.  3. The mitral valve is normal in structure. Mild to moderate mitral valve regurgitation. No evidence of mitral stenosis.  4. Tricuspid valve regurgitation is moderate.  5. The aortic valve is grossly normal. Aortic valve regurgitation is not visualized. Aortic valve sclerosis is present, with no evidence of aortic valve stenosis.  6. Aortic dilatation noted. There is mild dilatation of the ascending aorta, measuring 43 mm. Comparison(s): LV function is slightly reduced, and RV pressure is more elevated. FINDINGS  Left Ventricle: Left ventricular ejection fraction, by estimation, is 45 to 50%. Left ventricular ejection fraction by 2D MOD biplane is 51.0 %. The left ventricle has mildly decreased function. The left ventricle has no regional wall motion abnormalities. The left ventricular internal cavity size was normal in size. There is mild left ventricular hypertrophy. Abnormal (paradoxical) septal motion consistent with post-operative status and the interventricular septum is flattened in diastole ('D' shaped left ventricle), consistent with right ventricular volume overload. Left ventricular diastolic parameters are consistent with Grade II diastolic dysfunction (pseudonormalization). Right Ventricle: The right ventricular size is mildly enlarged. Right vetricular wall thickness was not well visualized. Right ventricular systolic function is mildly reduced. There is moderately elevated pulmonary artery systolic pressure. The tricuspid  regurgitant velocity is 3.71 m/s, and with an assumed right atrial pressure of 5 mmHg, the estimated  right ventricular systolic pressure is 12.4 mmHg. Left Atrium: Left atrial size was normal in size. Right Atrium: Right atrial size was normal in size. Pericardium: There is no evidence of pericardial effusion. Mitral Valve: The mitral valve is normal in structure. Mild to moderate mitral valve regurgitation. No evidence of mitral valve stenosis. MV peak gradient, 4.8 mmHg. The mean mitral valve gradient is 2.0 mmHg. Tricuspid Valve: The tricuspid valve is normal in structure. Tricuspid valve regurgitation is moderate. Aortic Valve: The aortic valve is grossly normal. Aortic valve regurgitation is not visualized. Aortic valve sclerosis is present, with no evidence of aortic valve stenosis. Aortic valve mean gradient measures 7.0 mmHg. Aortic valve peak gradient measures 13.7 mmHg. Aortic valve area, by VTI measures 2.82 cm. Pulmonic Valve: The pulmonic valve was normal in structure. Pulmonic valve regurgitation is mild. No evidence of pulmonic stenosis. Aorta: Aortic dilatation noted. There is mild dilatation of the ascending aorta, measuring 43 mm. Venous: IVC assessment for right atrial pressure unable to be performed due to mechanical ventilation. IAS/Shunts: No atrial level shunt detected by color flow Doppler.  LEFT VENTRICLE PLAX 2D                        Biplane EF (MOD) LVIDd:         4.71 cm         LV Biplane EF:   Left LVIDs:         3.79 cm  ventricular LV PW:         1.33 cm                          ejection LV IVS:        1.01 cm                          fraction by LVOT diam:     2.50 cm                          2D MOD LV SV:         94                               biplane is LV SV Index:   42                               51.0 %. LVOT Area:     4.91 cm                                Diastology                                LV e' medial:    5.87 cm/s LV Volumes (MOD)               LV E/e' medial:  17.9 LV vol d, MOD    107.0 ml      LV e' lateral:   9.36 cm/s A2C:                            LV E/e' lateral: 11.2 LV vol d, MOD    121.0 ml A4C: LV vol s, MOD    54.3 ml A2C: LV vol s, MOD    58.8 ml A4C: LV SV MOD A2C:   52.7 ml LV SV MOD A4C:   121.0 ml LV SV MOD BP:    58.7 ml RIGHT VENTRICLE RV Basal diam:  3.15 cm RV S prime:     7.29 cm/s LEFT ATRIUM             Index        RIGHT ATRIUM           Index LA diam:        4.30 cm 1.94 cm/m   RA Area:     15.60 cm LA Vol (A2C):   53.3 ml 24.07 ml/m  RA Volume:   33.80 ml  15.26 ml/m LA Vol (A4C):   59.0 ml 26.64 ml/m LA Biplane Vol: 59.9 ml 27.05 ml/m  AORTIC VALVE                     PULMONIC VALVE AV Area (Vmax):    2.68 cm      PV Vmax:          1.12 m/s AV Area (Vmean):   2.34 cm      PV Vmean:         71.100 cm/s AV Area (VTI):     2.82 cm  PV VTI:           0.191 m AV Vmax:           185.00 cm/s   PV Peak grad:     5.0 mmHg AV Vmean:          130.000 cm/s  PV Mean grad:     2.0 mmHg AV VTI:            0.333 m       PR End Diast Vel: 15.68 msec AV Peak Grad:      13.7 mmHg AV Mean Grad:      7.0 mmHg LVOT Vmax:         101.00 cm/s LVOT Vmean:        62.000 cm/s LVOT VTI:          0.191 m LVOT/AV VTI ratio: 0.57  AORTA Ao Root diam: 3.70 cm MITRAL VALVE                TRICUSPID VALVE MV Area (PHT): 4.31 cm     TR Peak grad:   55.1 mmHg MV Area VTI:   3.21 cm     TR Vmax:        371.00 cm/s MV Peak grad:  4.8 mmHg MV Mean grad:  2.0 mmHg     SHUNTS MV Vmax:       1.10 m/s     Systemic VTI:  0.19 m MV Vmean:      65.2 cm/s    Systemic Diam: 2.50 cm MV Decel Time: 176 msec MR Peak grad: 87.2 mmHg MR Vmax:      467.00 cm/s MV E velocity: 105.00 cm/s MV A velocity: 34.70 cm/s MV E/A ratio:  3.03 Donnelly Angelica Electronically signed by Donnelly Angelica Signature Date/Time: 07/20/2021/1:34:48 PM    Final    [place] EKG SITE RITE  Result Date: 07/21/2021 If Site Rite image not attached, placement could not be confirmed due to current cardiac rhythm.     Assessment/Plan:  Alan Stafford is a 76 y.o. male with medical problems of  coronary artery disease with history of non-STEMI, CABG x3 (May 2023 @ [facility]), atrial fibrillation/flutter status post ablation, bilateral carotid artery stenosis, hypertension, hyperlipidemia, type 2 diabetes, hypothyroidism, CKD, COPD, history of stroke, chronic pain  was admitted on 07/13/2021 for :  SOB (shortness of breath) [R06.02] Acute respiratory failure with hypoxia ([place]) [J96.01] Community acquired pneumonia of right lung, unspecified part of lung [J18.9] Hypoxia [R09.02]  Acute kidney injury Hyperkalemia Baseline creatinine of 1.6/GFR 45 from Jun 25, 2021 Current urine output of about 500 cc yesterday  Creatinine trends worsening since May 25.  It has now increased to 3.98. AKI likely secondary to ATN from hypotension caused by hemodynamic instability and from pneumonia. BUN, creatinine trends are worsening.  Potassium was 6.1 yesterday but has improved today.   -Staff reports that urine output trends are improving today -If BUN, creatinine, potassium are still worse tomorrow, consider CRRT.  Acute respiratory failure Ventilator dependent Management as per ICU team    Alan Stafford 07/22/21

## 2021-07-22 NOTE — Progress Notes (Signed)
NAME:  Alan Stafford, MRN:  [id number], DOB:  [birth year], LOS: 3 ADMISSION DATE:  07/14/2021, CONSULTATION DATE:  07/20/21 REFERRING MD:  Dr. Nevada Crane, CHIEF COMPLAINT:  Shortness of breath   History of Present Illness:  76 year old male presenting to [facility] ED from rehab via EMS due to worsening shortness of breath, productive cough, acute oxygen requirement of 3.5 L nasal cannula that has been steadily increasing since 07/16/2021 and fatigue in the setting of recent pneumonia diagnosis.  Patient was prescribed antibiotics and prednisone in rehab for presumed pneumonia. Of note patient was recently discharged to rehab from the [facility] post CABG 06/27/2021.  Per daughter bedside she thinks her mom noted a low-grade fever when he was at the facility but no other complaints that she is aware of.  ED course: Vital signs stable on arrival, however patient remains on acute oxygen.  Imaging revealed worsening pulmonary opacities particularly in the right lung.  Lab work revealed elevated BNP with demand ischemia as troponin elevated but flat in the 40s.  Leukocytosis, however lactic WNL. Due to concern for worsening pneumonia while on antibiotics, TRH consulted for admission to inpatient. medications given: Cefepime, Lasix 20 mg, vancomycin Initial Vitals: 99 F, mildly tachypneic at 22, NSR 74, BP 119/67 and SPO2 96% on 4 L nasal cannula Significant labs: (Labs/ Imaging personally reviewed) I, Domingo Pulse [place], AGACNP-BC, personally viewed and interpreted this ECG. EKG Interpretation: Date: 07/05/2021, EKG Time: 17:09, Rate: 78, Rhythm: NSR, QRS Axis: LAD, Intervals: LAFB, ST/T Wave abnormalities: None, Narrative Interpretation: NSR with LAD & LAFB Chemistry: Na+: 134, K+: 4.3, BUN/Cr.:  27/1.71, Serum CO2/ AG: 22/9, AST: 84 Hematology: WBC: 12.3, Hgb: 8.5,  Troponin: 44> 46, BNP: 752.9, Lactic: 1.2 > 0.9, COVID-19 & Influenza A/B: Negative ABG: 7.42/38/57/24.6 CXR 07/15/2021: Increasing pulmonary opacity  throughout the right lung and in the left lower lobe that could represent developing pneumonia or asymmetric edema  Prior to moving inpatient patient's respiratory status declined, he had increased work of breathing somnolent but arousable to voice as and was placed on BiPAP support.  ABG reassuring, therefore transferred to stepdown unit on BiPAP.  TRH urgently consulted cardiology for assistance in management. PCCM consulted for assistance in management and monitoring due to worsening respiratory status with high risk for intubation. Upon arrival to ICU patient tachypneic, diaphoretic with increased work of breathing and A-fib RVR with heart rate sustaining in the 150s.  Patient also responsive but lethargic.  Daughter brought bedside explaining decision to emergently intubate and place patient on mechanical ventilatory support.   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  Upon bedside assessment patient acutely lethargic with increased work of breathing tachypnea and A-fib RVR-decision made to emergently intubate and place the patient on mechanical ventilatory support.  Interim History / Subjective:  Upon arrival to ICU patient tachypneic, diaphoretic with increased work of breathing and A-fib RVR with heart rate sustaining in the 150s.  Patient also responsive but lethargic.  Daughter brought bedside explaining decision to emergently intubate and place patient on mechanical ventilatory support.  Objective   Blood pressure 114/63, pulse 76, temperature (!) 97.2 F (36.2 C), resp. rate 18, height 6\' 2"  (1.88 m), weight  94.8 kg,  SpO2 96 %.    Vent Mode: PSV FiO2 (%):  [28 %-30 %] 30 % Set Rate:  [10 bmp] 10 bmp PEEP:  [5 cmH20-10 cmH20] 10 cmH20 Pressure Support:  [5 cmH20-10 cmH20] 10 cmH20 Plateau Pressure:  [27 cmH20-28 cmH20] 28 cmH20   Intake/Output Summary (Last 24 hours) at 07/22/2021 1714 Last data filed at 07/22/2021 1256 Gross per 24 hour  Intake 1587.13 ml  Output 810 ml  Net 777.13 ml    Filed Weights   07/24/2021 1714  Weight: 94.8 kg    Examination: General: Adult male, diaphoretic critically, lying in bed in respiratory distress HEENT: MM pink/moist, anicteric, atraumatic, neck supple Neuro: RASS: -1, able to follow intermittent commands, PERRL +3, MAE CV: s1s2 irregular, tachycardic, A-Fib RVR in 170's on monitor, no r/m/g Pulm: Regular, tachypneic & labored on BIPAP, breath sounds coarse crackles RUL, coarse- LUL, diminished with crackles-BLL GI: soft, rounded, non tender, bs x 4 GU: foley in place with clear yellow urine Skin: DTI Right calf, blanchable redness on sacrum Extremities: warm/dry, pulses + 2 R/P, no edema noted  Resolved Hospital Problem list     Assessment & Plan:  Acute Hypoxic Respiratory Failure secondary to HCAP and suspected pulmonary edema Chronic Interstitial Dz? PMHx: Asthma                           05/2021                                                                    04/2016 - Ventilator settings: PC-Automode 10/10 as tolerated - VT has increased on PCV, Flow graphics have improved, still has initial P0.1 in excess  - Wean FiO2 as tolerated, maintain SpO2 > 90% - Head of bed elevated 30 degrees, VAP protocol in place - Plateau pressures less than 30 cm H20  - Intermittent chest x-ray & ABG PRN - Daily WUA with SBT as tolerated  - Ensure adequate pulmonary hygiene  - F/u cultures, trend PCT - Continue HCAP coverage cefepime & linezolid - Bronchodilators PRN - Dilaudid in place of  Fentanyl while on Zyvox. Propofol drip. - Initiate peripheral  phenylephrine drip in the setting of sedatives, wean as tolerated to maintain MAP > 65 - Review of several CT shows question of ongoing and chronic infiltrates some of which GGO - Send serologies and start steroids for now.  - Hold amio - Caution with volume reduction as creatinine is rising.   Elevated BNP, suspect acute HFrEF exacerbation CAD status post recent CABG (06/2021) Atrial fibrillation with RVR Recent echo 06/14/2021 showed preserved LVEF and normal diastolic function.  A-fib RVR reverted to sinus rhythm post intubation and airway support. -Consider restarting outpatient amiodarone, per cardiology recommendation (stopped by [facility] due to concerns for ARDS & amnio toxicity) -Continue warfarin per pharmacy consult - Echocardiogram preserved EF, but RV is overloaded and septum is D shaped - Continuous cardiac monitoring  - Strict I/O's: alert provider if UOP < 0.5 mL/kg/hr - Daily weights to assess volume status - Diurese on hold for elevated creatinine   - Supplemental oxygen as needed, maintain SpO2 > 90% - Cardiology following, appreciate input  AKI on CKD stage IIIb - Daily  BMP, replace electrolytes PRN - Avoid nephrotoxic agents as able, ensure adequate renal perfusion - Lokelma again today - Hold lasix, albumin dose again today - At risk for needing CRRT -If creatinine rising tomorrow will ask nephrology to see, (d/w nephro, will wait another day)  Hyperthyroidism -Continue outpatient regimen: Methimazole  - f/u TSH, T3, T4  Type 2 Diabetes Mellitus - Monitor CBG Q 4 hours - SSI moderate dosing - target range while in ICU: 140-180 - follow ICU hyper/hypo-glycemia protocol   Best Practice (right click and "Reselect all SmartList Selections" daily)  Diet/type: NPO w/ meds via tube DVT prophylaxis: DOAC GI prophylaxis: PPI Lines: N/A Foley:  Yes, and it is still needed Code Status:  full code Last date of multidisciplinary goals of care discussion [07/20/21]  Labs    CBC: Recent Labs  Lab 06/27/2021 1740 07/20/21 0610 07/21/21 0547 07/22/21 0316  WBC 12.3* 16.7* 13.5* 10.4  NEUTROABS 10.5* 14.7* 12.4*  --   HGB 8.5* 8.7* 8.2* 7.2*  HCT 28.0* 29.3* 29.1* 25.1*  MCV 87.8 88.5 91.5 90.9  PLT 430* 467* 363 324     Basic Metabolic Panel: Recent Labs  Lab 07/09/2021 1740 07/20/21 0610 07/21/21 0547 07/22/21 0316  NA 134* 137 135 136  K 4.3 4.6 6.1* 5.2*  CL 103 105 101 103  CO2 22 23 23 22   GLUCOSE 144* 104* 220* 192*  BUN 27* 30* 48* 70*  CREATININE 1.71* 2.06* 3.06* 3.98*  CALCIUM 8.3* 8.2* 8.3* 7.8*  MG  --  1.9 2.3 2.3  PHOS  --  4.7* 8.7* 8.2*    GFR: Estimated Creatinine Clearance: 18.6 mL/min (A) (by C-G formula based on SCr of 3.98 mg/dL (H)). Recent Labs  Lab 06/26/2021 1740 06/25/2021 2333 07/20/21 0610 07/21/21 0547 07/22/21 0316  PROCALCITON  --   --  0.30  --   --   WBC 12.3*  --  16.7* 13.5* 10.4  LATICACIDVEN 1.2 0.9  --   --   --      Liver Function Tests: Recent Labs  Lab 07/21/2021 1740 07/20/21 0610 07/22/21 0316  AST 84* 126* 80*  ALT 40 54* 47*  ALKPHOS 101 129* 103  BILITOT 0.9 1.6* 1.2  PROT 7.5 7.7 7.0  ALBUMIN 2.3* 2.3* 2.3*    No results for input(s): LIPASE, AMYLASE in the last 168 hours. No results for input(s): AMMONIA in the last 168 hours.  ABG    Component Value Date/Time   PHART 7.32 (L) 07/20/2021 0619   PCO2ART 46 07/20/2021 0619   PO2ART 223 (H) 07/20/2021 0619   HCO3 23.7 07/20/2021 0619   ACIDBASEDEF 2.6 (H) 07/20/2021 0619   O2SAT 98.8 07/20/2021 0619      Coagulation Profile: Recent Labs  Lab 07/05/2021 1740 07/20/21 0610 07/21/21 0547 07/22/21 0316  INR 2.6* 3.0* 2.8* 2.9*     Cardiac Enzymes: No results for input(s): CKTOTAL, CKMB, CKMBINDEX, TROPONINI in the last 168 hours.  HbA1C: Hgb A1c MFr Bld  Date/Time Value Ref Range Status  07/20/2021 06:10 AM 6.3 (H) 4.8 - 5.6 % Final    Comment:    (NOTE) Pre diabetes:          5.7%-6.4%  Diabetes:               >6.4%  Glycemic control for   <7.0% adults with diabetes   04/23/2016 02:59 AM 6.2 (H) 4.8 - 5.6 % Final    Comment:    (NOTE)  Pre-diabetes: 5.7 - 6.4         Diabetes: >6.4         Glycemic control for adults with diabetes: <7.0     CBG: Recent Labs  Lab 07/22/21 0012 07/22/21 0327 07/22/21 0720 07/22/21 1126 07/22/21 1539  GLUCAP 193* 188* 165* 231* 176*     Review of Systems:   UTA- patient lethargic and in respiratory distress  Past Medical History:  He,  has a past medical history of A-fib ([place]), Abnormal heart rhythm, Arthritis, Asthma, CAP (community acquired pneumonia) (04/22/2016), Diabetes mellitus without complication ([place]), Dysrhythmia, GERD (gastroesophageal reflux disease), Hemoptysis (04/22/2016), Hypercholesteremia, Hypertension, Hyperthyroidism, Kidney stones, and Sepsis ([place]) (04/22/2016).   Surgical History:   Past Surgical History:  Procedure Laterality Date   CATARACT EXTRACTION W/PHACO Left 09/26/2016   Procedure: CATARACT EXTRACTION PHACO AND INTRAOCULAR LENS PLACEMENT (IOC);  Surgeon: Eulogio Bear, MD;  Location: [facility] ORS;  Service: Ophthalmology;  Laterality: Left;  [place] 00:52.0 AP% 17.0 CDE 8.86 Fluid pack lot # [id number]   EYE SURGERY Right 2015   cataract   JOINT REPLACEMENT Right    knee   KNEE SURGERY Right 2015   total knee replacement revision   LEFT HEART CATH AND CORONARY ANGIOGRAPHY N/A 06/25/2021   Procedure: LEFT HEART CATH AND CORONARY ANGIOGRAPHY;  Surgeon: Isaias Cowman, MD;  Location: [facility];  Service: Cardiovascular;  Laterality: N/A;     Social History:   reports that he has never smoked. He uses smokeless tobacco. He reports that he does not drink alcohol and does not use drugs.   Family History:  His family history includes Dementia in his mother; Heart disease in his father.   Allergies Allergies  Allergen Reactions   Statins Other (See Comments)    Leg weakness   Sulfa Antibiotics  Rash     Home Medications  Prior to Admission medications   Medication Sig Start Date End Date Taking? Authorizing Provider  albuterol (VENTOLIN HFA) 108 (90 Base) MCG/ACT inhaler Inhale into the lungs every 6 (six) hours as needed for wheezing or shortness of breath.   Yes [provider]  amiodarone (PACERONE) 200 MG tablet Take 200 mg by mouth daily.   Yes [provider]  aspirin EC 81 MG EC tablet Take 1 tablet (81 mg total) by mouth daily. Swallow whole. 06/25/21  Yes Wouk, Ailene Rud, MD  budesonide-formoterol [facility]) 160-4.5 MCG/ACT inhaler Inhale 2 puffs into the lungs 2 (two) times daily.   Yes [provider]  doxycycline (VIBRA-TABS) 100 MG tablet Take 100 mg by mouth 2 (two) times daily. 07/18/21 07/25/21 Yes [provider]  escitalopram (LEXAPRO) 20 MG tablet Take 20 mg by mouth daily.   Yes [provider]  ezetimibe (ZETIA) 10 MG tablet Take 10 mg by mouth daily.   Yes [provider]  furosemide (LASIX) 40 MG tablet Take 40 mg by mouth.   Yes [provider]  glipiZIDE (GLUCOTROL XL) 10 MG 24 hr tablet Take 10 mg by mouth daily with breakfast.   Yes [provider]  HYDROcodone-acetaminophen (NORCO) 10-325 MG tablet Take 1 tablet by mouth every 6 (six) hours as needed.   Yes [provider]  insulin glargine (LANTUS) 100 UNIT/ML injection Inject 20 Units into the skin daily.   Yes [provider]  metoprolol succinate (TOPROL-XL) 50 MG 24 hr tablet Take 150 mg by mouth daily. Take with or immediately following a meal.   Yes [provider]  Multiple Vitamins-Minerals (MULTIVITAMIN WITH MINERALS) tablet Take 1 tablet by mouth daily.   Yes [provider]  nystatin (MYCOSTATIN/NYSTOP) powder Apply 1 application. topically daily. Apply to groin topically every day and evening for rash   Yes [provider]  omeprazole (PRILOSEC) 20 MG capsule Take 20 mg by mouth 2  (two) times daily before a meal.   Yes [provider]  polyethylene glycol powder (GLYCOLAX/MIRALAX) 17 GM/SCOOP powder Take 17 g by mouth daily.   Yes [provider]  potassium chloride SA (KLOR-CON M) 20 MEQ tablet Take 20 mEq by mouth daily.   Yes [provider]  predniSONE (DELTASONE) 20 MG tablet Take 20 mg by mouth in the morning and at bedtime. 07/18/21 07/25/21 Yes [provider]  pregabalin (LYRICA) 25 MG capsule Take 1 capsule (25 mg total) by mouth 3 (three) times daily. 06/25/21  Yes Wouk, Ailene Rud, MD  sennosides-docusate sodium (SENOKOT-S) 8.6-50 MG tablet Take 2 tablets by mouth in the morning and at bedtime.   Yes [provider]  traZODone (DESYREL) 50 MG tablet Take 150 mg by mouth at bedtime as needed for sleep. 07/09/21 07/24/21 Yes [provider]  warfarin (COUMADIN) 3 MG tablet Take 3 mg by mouth daily. Take 1.5mg  once daily on Tuesdays,Thursdays,Sundays, and Saturdays Take 3mg  once daily on Mondays, Wednesdays, and Fridays   Yes [provider]  Ampicillin-Sulbactam 3 g in sodium chloride 0.9 % 100 mL Inject 3 g into the vein every 6 (six) hours. 06/25/21   Wouk, Ailene Rud, MD  methimazole (TAPAZOLE) 10 MG tablet Take 1 tablet (10 mg total) by mouth 2 (two) times daily. Patient taking differently: Take 10 mg by mouth daily. 06/25/21   Wouk, Ailene Rud, MD  metoprolol succinate (TOPROL-XL) 25 MG 24 hr tablet Take 1 tablet (25 mg total) by mouth daily. Patient not taking: Reported on 06/28/2021 06/25/21   Gwynne Edinger, MD  mometasone-formoterol [facility]) 200-5 MCG/ACT AERO Inhale 2 puffs into the lungs 2 (two) times daily. 06/25/21   Wouk, Ailene Rud, MD  pantoprazole (PROTONIX) 40 MG injection Inject 40 mg into the vein every 12 (twelve) hours. 06/25/21   Wouk, Ailene Rud, MD     Critical care time: 50 minutes

## 2021-07-22 NOTE — Consult Note (Signed)
Alan Stafford NOTE       Patient ID: GRYPHON MAUTE MRN: [id number] DOB/AGE: May 27, 1945 76 y.o.  Admit date: 07/13/2021 Referring Physician Dr. Irene Pap  Primary Physician [facility] primary care  Primary Cardiologist Dr. Clayborn Bigness Reason for Consultation acute on chronic HFpEF, s/p CABG 06/27/21  HPI: Alan Stafford is a 76 year old male with a past medical history of NSTEMI s/p recent CABG x3 (SVG-OM-diagonal, LIMA-LAD) and left atrial appendage ligation (0000000, Dr. Silvio Stafford), HFpEF (LVEF 55-60%, w/o WMA's  moderate MR), paroxysmal atrial fibrillation/flutter s/p ablation (2012, previously on flecainide, recently started on amiodarone), bilateral carotid artery stenosis, hypertension, hyperlipidemia, type 2 diabetes, hyperthyroidism on MMI, CKD 3, COPD, CVA, chronic pain who presented to [facility] ED from short-term rehab on 07/18/2021 with shortness of breath and productive cough that has been worsening over the past week failing outpatient antibiotic treatment for presumed pneumonia that was prescribed at rehab.  He was admitted to stepdown on BiPAP, but decompensated and required intubation overnight. Was in A-fib with RVR, now in sinus. Hypotensive, started on phenylephrine. Cardiology is consulted for assistance with his heart failure.   Interval history: - Worsening renal function. Poor UOP. - Remains intubated. Vent settings improving.  - tachycardic this morning in setting of awakening trial.   Review of systems complete and found to be negative unless listed above     Past Medical History:  Diagnosis Date   A-fib (Alan Stafford)    Abnormal heart rhythm    Arthritis    Asthma    CAP (community acquired pneumonia) 04/22/2016   Diabetes mellitus without complication ([facility])    Dysrhythmia    afib treated with flecainide   GERD (gastroesophageal reflux disease)    Hemoptysis 04/22/2016   Hypercholesteremia    Hypertension    Hyperthyroidism    Kidney stones    Sepsis (Alan Stafford)  04/22/2016    Past Surgical History:  Procedure Laterality Date   CATARACT EXTRACTION W/PHACO Left 09/26/2016   Procedure: CATARACT EXTRACTION PHACO AND INTRAOCULAR LENS PLACEMENT ([place]);  Surgeon: Alan Bear, MD;  Location: [facility] ORS;  Service: Ophthalmology;  Laterality: Left;  [place] 00:52.0 AP% 17.0 CDE 8.86 Fluid pack lot # [id number]   EYE SURGERY Right 2015   cataract   JOINT REPLACEMENT Right    knee   KNEE SURGERY Right 2015   total knee replacement revision   LEFT HEART CATH AND CORONARY ANGIOGRAPHY N/A 06/25/2021   Procedure: LEFT HEART CATH AND CORONARY ANGIOGRAPHY;  Surgeon: Alan Cowman, MD;  Location: [facility];  Service: Cardiovascular;  Laterality: N/A;    Medications Prior to Admission  Medication Sig Dispense Refill Last Dose   albuterol (VENTOLIN HFA) 108 (90 Base) MCG/ACT inhaler Inhale into the lungs every 6 (six) hours as needed for wheezing or shortness of breath.      amiodarone (PACERONE) 200 MG tablet Take 200 mg by mouth daily.      aspirin EC 81 MG EC tablet Take 1 tablet (81 mg total) by mouth daily. Swallow whole. 30 tablet 11    budesonide-formoterol (SYMBICORT) 160-4.5 MCG/ACT inhaler Inhale 2 puffs into the lungs 2 (two) times daily.      doxycycline (VIBRA-TABS) 100 MG tablet Take 100 mg by mouth 2 (two) times daily.      escitalopram (LEXAPRO) 20 MG tablet Take 20 mg by mouth daily.      ezetimibe (ZETIA) 10 MG tablet Take 10 mg by mouth daily.      furosemide (LASIX) 40  MG tablet Take 40 mg by mouth.      glipiZIDE (GLUCOTROL XL) 10 MG 24 hr tablet Take 10 mg by mouth daily with breakfast.      HYDROcodone-acetaminophen (NORCO) 10-325 MG tablet Take 1 tablet by mouth every 6 (six) hours as needed.      insulin glargine (LANTUS) 100 UNIT/ML injection Inject 20 Units into the skin daily.      metoprolol succinate (TOPROL-XL) 50 MG 24 hr tablet Take 150 mg by mouth daily. Take with or immediately following a meal.      Multiple  Vitamins-Minerals (MULTIVITAMIN WITH MINERALS) tablet Take 1 tablet by mouth daily.      nystatin (MYCOSTATIN/NYSTOP) powder Apply 1 application. topically daily. Apply to groin topically every day and evening for rash      omeprazole (PRILOSEC) 20 MG capsule Take 20 mg by mouth 2 (two) times daily before a meal.      polyethylene glycol powder (GLYCOLAX/MIRALAX) 17 GM/SCOOP powder Take 17 g by mouth daily.      potassium chloride SA (KLOR-CON M) 20 MEQ tablet Take 20 mEq by mouth daily.      predniSONE (DELTASONE) 20 MG tablet Take 20 mg by mouth in the morning and at bedtime.      pregabalin (LYRICA) 25 MG capsule Take 1 capsule (25 mg total) by mouth 3 (three) times daily.      sennosides-docusate sodium (SENOKOT-S) 8.6-50 MG tablet Take 2 tablets by mouth in the morning and at bedtime.      traZODone (DESYREL) 50 MG tablet Take 150 mg by mouth at bedtime as needed for sleep.      warfarin (COUMADIN) 3 MG tablet Take 3 mg by mouth daily. Take 1.5mg  once daily on Tuesdays,Thursdays,Sundays, and Saturdays Take 3mg  once daily on Mondays, Wednesdays, and Fridays      Ampicillin-Sulbactam 3 g in sodium chloride 0.9 % 100 mL Inject 3 g into the vein every 6 (six) hours.      methimazole (TAPAZOLE) 10 MG tablet Take 1 tablet (10 mg total) by mouth 2 (two) times daily. (Patient taking differently: Take 10 mg by mouth daily.)      metoprolol succinate (TOPROL-XL) 25 MG 24 hr tablet Take 1 tablet (25 mg total) by mouth daily. (Patient not taking: Reported on 06/29/2021)   Not Taking   mometasone-formoterol (DULERA) 200-5 MCG/ACT AERO Inhale 2 puffs into the lungs 2 (two) times daily. 1 each     pantoprazole (PROTONIX) 40 MG injection Inject 40 mg into the vein every 12 (twelve) hours. 1 each     Social History   Socioeconomic History   Marital status: Married    Spouse name: Not on file   Number of children: Not on file   Years of education: Not on file   Highest education level: Not on file   Occupational History   Not on file  Tobacco Use   Smoking status: Never   Smokeless tobacco: Current   Tobacco comments:    uses dip a little  Vaping Use   Vaping Use: Never used  Substance and Sexual Activity   Alcohol use: No    Alcohol/week: 0.0 standard drinks   Drug use: No   Sexual activity: Not on file  Other Topics Concern   Not on file  Social History Narrative   Not on file   Social Determinants of Health   Financial Resource Strain: Not on file  Food Insecurity: Not on file  Transportation Needs: Not  on file  Physical Activity: Not on file  Stress: Not on file  Social Connections: Not on file  Intimate Partner Violence: Not on file    Family History  Problem Relation Age of Onset   Dementia Mother    Heart disease Father       PHYSICAL EXAM General: Elderly and critically ill appearing Caucasian male, intubated with mouth open, wife and 3 daughters at bedside.   HEENT:  Normocephalic and atraumatic. Neck:  No JVD.  Lungs: Coarse breath sounds bilaterally, diminished at the bases. Chest: Gauze covering median sternotomy incision, Holter monitor left chest Heart: HRRR . Normal S1 and S2 without gallops or murmurs.  Abdomen: Mildly distended appearing appearing.  Msk: Normal strength and tone for age. Extremities: Warm and well perfused. No clubbing, cyanosis.  No lower extremity edema.  SCDs bilaterally Neuro: Intubated, sedated Psych: Intubated, sedated  Labs:   Lab Results  Component Value Date   WBC 10.4 07/22/2021   HGB 7.2 (L) 07/22/2021   HCT 25.1 (L) 07/22/2021   MCV 90.9 07/22/2021   PLT 324 07/22/2021    Recent Labs  Lab 07/22/21 0316  NA 136  K 5.2*  CL 103  CO2 22  BUN 70*  CREATININE 3.98*  CALCIUM 7.8*  PROT 7.0  BILITOT 1.2  ALKPHOS 103  ALT 47*  AST 80*  GLUCOSE 192*    Lab Results  Component Value Date   TROPONINI <0.03 05/18/2016    No results found for: CHOL No results found for: HDL No results found for:  [facility] Lab Results  Component Value Date   TRIG 121 07/21/2021   No results found for: CHOLHDL No results found for: LDLDIRECT    Radiology: DG Abd 1 View  Result Date: 07/21/2021 CLINICAL DATA:  Endotracheal tube adjusted. Nasogastric tube placement. EXAM: PORTABLE CHEST 1 VIEW COMPARISON:  AP chest 07/21/2021 at 0836 hours; upper abdominal radiograph 07/20/2021 FINDINGS: AP chest 07/21/2021 at 951 hours: Endotracheal tube tip terminates approximately 3.0 cm above the carina, appearing at the superior aspect of the clavicular heads. This appears grossly similar to prior. Measurement tool on the current radiographs is not calibrated, and the tip of the tube measures approximately 2.0 cm from the carina compared to 4.5 cm previously however again these measurements are likely non comparable. Enteric tube again descends below the diaphragm with the tip excluded by collimation. Status post median sternotomy and CABG. Atrial clipping device is again seen. There is again extensive bilateral interstitial thickening and airspace density. No pneumothorax is seen. -- KUB 07/21/2021 at 0952 hours: Enteric tube descends below the diaphragm and curls along the greater curvature of the stomach with the tip overlying the distal stomach. Nonobstructed bowel-gas pattern. IMPRESSION: The endotracheal tube tip again appears to be at the superior aspect of the clavicular heads. No significant change in diffuse airspace disease. Nasogastric tube tip overlies the distal stomach. Electronically Signed   By: Yvonne Kendall M.D.   On: 07/21/2021 22:09   DG Abd 1 View  Result Date: 07/20/2021 CLINICAL DATA:  OG tube placement. EXAM: ABDOMEN - 1 VIEW COMPARISON:  01/28/2020 FINDINGS: OG tube tip is in the distal stomach. Gaseous distension of: Evident with some mildly distended gas-filled small bowel loops in the upper pelvis. IMPRESSION: OG tube tip is in the distal stomach. Electronically Signed   By: Misty Stanley M.D.   On:  07/20/2021 06:00   CARDIAC CATHETERIZATION  Result Date: 06/25/2021   Prox RCA lesion is 40%  stenosed.   RPDA lesion is 70% stenosed.   Ost Cx to Prox Cx lesion is 75% stenosed.   1st Mrg lesion is 70% stenosed.   Prox LAD lesion is 95% stenosed.   Ost LM to Mid LM lesion is 60% stenosed.   There is mild left ventricular systolic dysfunction.   The left ventricular ejection fraction is 45-50% by visual estimate. 1.  Non-STEMI 2.  Severe two-vessel coronary artery disease with complex 95% calcified stenosis proximal LAD involving takeoff of large D1, 60% stenosis left main, 75% stenosis ostial left circumflex 3.  Preserved left ventricular function Recommendations Transfer to [facility]G   DG Chest Port 1 View  Result Date: 07/21/2021 CLINICAL DATA:  Endotracheal tube adjusted. Nasogastric tube placement. EXAM: PORTABLE CHEST 1 VIEW COMPARISON:  AP chest 07/21/2021 at 0836 hours; upper abdominal radiograph 07/20/2021 FINDINGS: AP chest 07/21/2021 at 951 hours: Endotracheal tube tip terminates approximately 3.0 cm above the carina, appearing at the superior aspect of the clavicular heads. This appears grossly similar to prior. Measurement tool on the current radiographs is not calibrated, and the tip of the tube measures approximately 2.0 cm from the carina compared to 4.5 cm previously however again these measurements are likely non comparable. Enteric tube again descends below the diaphragm with the tip excluded by collimation. Status post median sternotomy and CABG. Atrial clipping device is again seen. There is again extensive bilateral interstitial thickening and airspace density. No pneumothorax is seen. -- KUB 07/21/2021 at 0952 hours: Enteric tube descends below the diaphragm and curls along the greater curvature of the stomach with the tip overlying the distal stomach. Nonobstructed bowel-gas pattern. IMPRESSION: The endotracheal tube tip again appears to be at the superior aspect of the clavicular  heads. No significant change in diffuse airspace disease. Nasogastric tube tip overlies the distal stomach. Electronically Signed   By: Yvonne Kendall M.D.   On: 07/21/2021 22:09   DG Chest Port 1 View  Result Date: 07/21/2021 CLINICAL DATA:  Altered mental status EXAM: PORTABLE CHEST 1 VIEW COMPARISON:  07/20/2021 FINDINGS: Endotracheal tube with tip at the clavicular heads. The enteric tube reaches the stomach. Extensive artifact from EKG leads. Extensive bilateral interstitial and airspace density. Cardiomegaly with prior CABG and left atrial clipping. IMPRESSION: Stable hardware positioning and bilateral airspace disease. Electronically Signed   By: Jorje Guild M.D.   On: 07/21/2021 08:55   DG Chest Port 1 View  Result Date: 07/20/2021 CLINICAL DATA:  Endotracheal tube adjustment EXAM: PORTABLE CHEST 1 VIEW COMPARISON:  07/20/2021 FINDINGS: Endotracheal tube is approximately 6 cm above the carina. Enteric tube is present. Temperature probe is present. Persistent right greater than left pulmonary opacities. Aeration is slightly improved. No significant pleural effusion. No pneumothorax. Stable cardiomediastinal contours. IMPRESSION: Lines and tubes as above. Persistent right greater than left pulmonary opacities with slight improvement in aeration. Electronically Signed   By: Macy Mis M.D.   On: 07/20/2021 08:24   DG Chest Port 1 View  Result Date: 07/20/2021 CLINICAL DATA:  Status post intubation. EXAM: PORTABLE CHEST 1 VIEW COMPARISON:  07/22/2021 FINDINGS: 0543 hours. Endotracheal tube tip is approximately 4.1 cm above the base of the carina. The NG tube passes into the stomach although the distal tip position is not included on the film. Esophageal temperature probe evident. Similar low volume film. The cardio pericardial silhouette is enlarged. Diffuse airspace disease in the right lung is similar to prior as is the patchy left upper lobe airspace opacity and  more consolidative disease  in the left lower lung. No substantial pleural effusion. Telemetry leads overlie the chest. IMPRESSION: 1. Endotracheal tube tip is approximately 4.1 cm above the base of the carina. 2. Persistent bilateral airspace disease, left greater than right and not substantially changed. Electronically Signed   By: Misty Stanley M.D.   On: 07/20/2021 06:15   DG Chest Port 1 View  Result Date: 06/26/2021 CLINICAL DATA:  Question sepsis EXAM: PORTABLE CHEST 1 VIEW COMPARISON:  06/21/2021 FINDINGS: Previous median sternotomy, CABG and atrial clip. Worsening low pulmonary density throughout the right lung and in the left lower lung suggesting pneumonia. Asymmetric edema could have a similar pattern. No dense consolidation or lobar collapse. No visible effusion. IMPRESSION: Increasing pulmonary opacity throughout the right lung and in the left lower lobe that could represent developing pneumonia or asymmetric edema. Electronically Signed   By: Nelson Chimes M.D.   On: 07/18/2021 17:59   ECHOCARDIOGRAM COMPLETE  Result Date: 07/20/2021    ECHOCARDIOGRAM REPORT   Patient Name:   TRYTON HERTING Date of Exam: 07/20/2021 Medical Rec #:  [id number]     Height:       74.0 in Accession #:    [id number]    Weight:       209.0 lb Date of Birth:  Nov 07, 1945     BSA:          2.215 m Patient Age:    76 years      BP:           101/56 mmHg Patient Gender: M             HR:           70 bpm. Exam Location:  [facility] Procedure: 2D Echo, Color Doppler and Cardiac Doppler Indications:     I42.9 Cardiomyopathy-unspecified  History:         Patient has prior history of Echocardiogram examinations, most                  recent 06/22/2021. Prior CABG; Risk Factors:Hypertension, HCL                  and Diabetes.  Sonographer:     Charmayne Sheer Referring Phys:  [id number] [place] L [place] Diagnosing Phys: Donnelly Angelica  Sonographer Comments: Echo performed with patient supine and on artificial respirator. IMPRESSIONS  1. Left ventricular ejection  fraction, by estimation, is 45 to 50%. Left ventricular ejection fraction by 2D MOD biplane is 51.0 %. The left ventricle has mildly decreased function. The left ventricle has no regional wall motion abnormalities. There is mild left ventricular hypertrophy. Left ventricular diastolic parameters are consistent with Grade II diastolic dysfunction (pseudonormalization). There is the interventricular septum is flattened in diastole ('D' shaped left ventricle), consistent with right ventricular volume overload.  2. Right ventricular systolic function is mildly reduced. The right ventricular size is mildly enlarged. There is moderately elevated pulmonary artery systolic pressure.  3. The mitral valve is normal in structure. Mild to moderate mitral valve regurgitation. No evidence of mitral stenosis.  4. Tricuspid valve regurgitation is moderate.  5. The aortic valve is grossly normal. Aortic valve regurgitation is not visualized. Aortic valve sclerosis is present, with no evidence of aortic valve stenosis.  6. Aortic dilatation noted. There is mild dilatation of the ascending aorta, measuring 43 mm. Comparison(s): LV function is slightly reduced, and RV pressure is more elevated. FINDINGS  Left Ventricle: Left ventricular ejection fraction, by estimation,  is 45 to 50%. Left ventricular ejection fraction by 2D MOD biplane is 51.0 %. The left ventricle has mildly decreased function. The left ventricle has no regional wall motion abnormalities. The left ventricular internal cavity size was normal in size. There is mild left ventricular hypertrophy. Abnormal (paradoxical) septal motion consistent with post-operative status and the interventricular septum is flattened in diastole ('D' shaped left ventricle), consistent with right ventricular volume overload. Left ventricular diastolic parameters are consistent with Grade II diastolic dysfunction (pseudonormalization). Right Ventricle: The right ventricular size is mildly  enlarged. Right vetricular wall thickness was not well visualized. Right ventricular systolic function is mildly reduced. There is moderately elevated pulmonary artery systolic pressure. The tricuspid  regurgitant velocity is 3.71 m/s, and with an assumed right atrial pressure of 5 mmHg, the estimated right ventricular systolic pressure is 0000000 mmHg. Left Atrium: Left atrial size was normal in size. Right Atrium: Right atrial size was normal in size. Pericardium: There is no evidence of pericardial effusion. Mitral Valve: The mitral valve is normal in structure. Mild to moderate mitral valve regurgitation. No evidence of mitral valve stenosis. MV peak gradient, 4.8 mmHg. The mean mitral valve gradient is 2.0 mmHg. Tricuspid Valve: The tricuspid valve is normal in structure. Tricuspid valve regurgitation is moderate. Aortic Valve: The aortic valve is grossly normal. Aortic valve regurgitation is not visualized. Aortic valve sclerosis is present, with no evidence of aortic valve stenosis. Aortic valve mean gradient measures 7.0 mmHg. Aortic valve peak gradient measures 13.7 mmHg. Aortic valve area, by VTI measures 2.82 cm. Pulmonic Valve: The pulmonic valve was normal in structure. Pulmonic valve regurgitation is mild. No evidence of pulmonic stenosis. Aorta: Aortic dilatation noted. There is mild dilatation of the ascending aorta, measuring 43 mm. Venous: IVC assessment for right atrial pressure unable to be performed due to mechanical ventilation. IAS/Shunts: No atrial level shunt detected by color flow Doppler.  LEFT VENTRICLE PLAX 2D                        Biplane EF (MOD) LVIDd:         4.71 cm         LV Biplane EF:   Left LVIDs:         3.79 cm                          ventricular LV PW:         1.33 cm                          ejection LV IVS:        1.01 cm                          fraction by LVOT diam:     2.50 cm                          2D MOD LV SV:         94                               biplane is LV  SV Index:   42  51.0 %. LVOT Area:     4.91 cm                                Diastology                                LV e' medial:    5.87 cm/s LV Volumes (MOD)               LV E/e' medial:  17.9 LV vol d, MOD    107.0 ml      LV e' lateral:   9.36 cm/s A2C:                           LV E/e' lateral: 11.2 LV vol d, MOD    121.0 ml A4C: LV vol s, MOD    54.3 ml A2C: LV vol s, MOD    58.8 ml A4C: LV SV MOD A2C:   52.7 ml LV SV MOD A4C:   121.0 ml LV SV MOD BP:    58.7 ml RIGHT VENTRICLE RV Basal diam:  3.15 cm RV S prime:     7.29 cm/s LEFT ATRIUM             Index        RIGHT ATRIUM           Index LA diam:        4.30 cm 1.94 cm/m   RA Area:     15.60 cm LA Vol (A2C):   53.3 ml 24.07 ml/m  RA Volume:   33.80 ml  15.26 ml/m LA Vol (A4C):   59.0 ml 26.64 ml/m LA Biplane Vol: 59.9 ml 27.05 ml/m  AORTIC VALVE                     PULMONIC VALVE AV Area (Vmax):    2.68 cm      PV Vmax:          1.12 m/s AV Area (Vmean):   2.34 cm      PV Vmean:         71.100 cm/s AV Area (VTI):     2.82 cm      PV VTI:           0.191 m AV Vmax:           185.00 cm/s   PV Peak grad:     5.0 mmHg AV Vmean:          130.000 cm/s  PV Mean grad:     2.0 mmHg AV VTI:            0.333 m       PR End Diast Vel: 15.68 msec AV Peak Grad:      13.7 mmHg AV Mean Grad:      7.0 mmHg LVOT Vmax:         101.00 cm/s LVOT Vmean:        62.000 cm/s LVOT VTI:          0.191 m LVOT/AV VTI ratio: 0.57  AORTA Ao Root diam: 3.70 cm MITRAL VALVE                TRICUSPID VALVE MV Area (PHT): 4.31 cm     TR Peak grad:   55.1 mmHg  MV Area VTI:   3.21 cm     TR Vmax:        371.00 cm/s MV Peak grad:  4.8 mmHg MV Mean grad:  2.0 mmHg     SHUNTS MV Vmax:       1.10 m/s     Systemic VTI:  0.19 m MV Vmean:      65.2 cm/s    Systemic Diam: 2.50 cm MV Decel Time: 176 msec MR Peak grad: 87.2 mmHg MR Vmax:      467.00 cm/s MV E velocity: 105.00 cm/s MV A velocity: 34.70 cm/s MV E/A ratio:  3.03 Donnelly Angelica Electronically signed  by Donnelly Angelica Signature Date/Time: 07/20/2021/1:34:48 PM    Final    ECHOCARDIOGRAM COMPLETE  Result Date: 06/23/2021    ECHOCARDIOGRAM REPORT   Patient Name:   KALOYAN ACREE Date of Exam: 06/22/2021 Medical Rec #:  [id number]     Height:       74.0 in Accession #:    [id number]    Weight:       200.0 lb Date of Birth:  Feb 12, 1946     BSA:          2.173 m Patient Age:    22 years      BP:           119/62 mmHg Patient Gender: M             HR:           71 bpm. Exam Location:  [facility] Procedure: 2D Echo, Cardiac Doppler and Color Doppler Indications:     NSTEMI I21.4  History:         Patient has prior history of Echocardiogram examinations, most                  recent 04/23/2016. Arrythmias:Atrial Fibrillation; Risk                  Factors:Diabetes and Hypertension.  Sonographer:     Sherrie Sport Referring Phys:  [id number] [place] TANG Diagnosing Phys: Alan Cowman MD IMPRESSIONS  1. Left ventricular ejection fraction, by estimation, is 55 to 60%. The left ventricle has normal function. The left ventricle has no regional wall motion abnormalities. Left ventricular diastolic parameters were normal.  2. Right ventricular systolic function is normal. The right ventricular size is normal.  3. The mitral valve is normal in structure. Moderate mitral valve regurgitation. No evidence of mitral stenosis.  4. The aortic valve is normal in structure. Aortic valve regurgitation is mild. Mild aortic valve stenosis.  5. The inferior vena cava is normal in size with greater than 50% respiratory variability, suggesting right atrial pressure of 3 mmHg. FINDINGS  Left Ventricle: Left ventricular ejection fraction, by estimation, is 55 to 60%. The left ventricle has normal function. The left ventricle has no regional wall motion abnormalities. The left ventricular internal cavity size was normal in size. There is  no left ventricular hypertrophy. Left ventricular diastolic parameters were normal. Right Ventricle: The right  ventricular size is normal. No increase in right ventricular wall thickness. Right ventricular systolic function is normal. Left Atrium: Left atrial size was normal in size. Right Atrium: Right atrial size was normal in size. Pericardium: There is no evidence of pericardial effusion. Mitral Valve: The mitral valve is normal in structure. Moderate mitral valve regurgitation. No evidence of mitral valve stenosis. MV peak gradient, 5.6 mmHg. The mean mitral valve gradient is 3.0 mmHg. Tricuspid  Valve: The tricuspid valve is normal in structure. Tricuspid valve regurgitation is mild . No evidence of tricuspid stenosis. Aortic Valve: The aortic valve is normal in structure. Aortic valve regurgitation is mild. Mild aortic stenosis is present. Aortic valve mean gradient measures 7.7 mmHg. Aortic valve peak gradient measures 12.4 mmHg. Aortic valve area, by VTI measures 2.04 cm. Pulmonic Valve: The pulmonic valve was normal in structure. Pulmonic valve regurgitation is not visualized. No evidence of pulmonic stenosis. Aorta: The aortic root is normal in size and structure. Venous: The inferior vena cava is normal in size with greater than 50% respiratory variability, suggesting right atrial pressure of 3 mmHg. IAS/Shunts: No atrial level shunt detected by color flow Doppler.  LEFT VENTRICLE PLAX 2D LVIDd:         4.60 cm      Diastology LVIDs:         2.90 cm      LV e' medial:    6.31 cm/s LV PW:         1.00 cm      LV E/e' medial:  17.4 LV IVS:        1.20 cm      LV e' lateral:   7.40 cm/s LVOT diam:     2.00 cm      LV E/e' lateral: 14.9 LV SV:         63 LV SV Index:   29 LVOT Area:     3.14 cm  LV Volumes (MOD) LV vol d, MOD A2C: 102.0 ml LV vol d, MOD A4C: 161.0 ml LV vol s, MOD A2C: 63.5 ml LV vol s, MOD A4C: 91.7 ml LV SV MOD A2C:     38.5 ml LV SV MOD A4C:     161.0 ml LV SV MOD BP:      54.8 ml RIGHT VENTRICLE RV S prime:     14.50 cm/s TAPSE (M-mode): 3.6 cm LEFT ATRIUM              Index        RIGHT ATRIUM            Index LA diam:        5.00 cm  2.30 cm/m   RA Area:     28.30 cm LA Vol (A2C):   84.2 ml  38.74 ml/m  RA Volume:   89.80 ml  41.32 ml/m LA Vol (A4C):   100.0 ml 46.01 ml/m LA Biplane Vol: 92.1 ml  42.38 ml/m  AORTIC VALVE                     PULMONIC VALVE AV Area (Vmax):    1.80 cm      PV Vmax:        0.96 m/s AV Area (Vmean):   1.67 cm      PV Vmean:       70.800 cm/s AV Area (VTI):     2.04 cm      PV VTI:         0.173 m AV Vmax:           176.33 cm/s   PV Peak grad:   3.7 mmHg AV Vmean:          124.233 cm/s  PV Mean grad:   2.0 mmHg AV VTI:            0.307 m       RVOT Peak grad: 3 mmHg AV Peak  Grad:      12.4 mmHg AV Mean Grad:      7.7 mmHg LVOT Vmax:         101.00 cm/s LVOT Vmean:        66.200 cm/s LVOT VTI:          0.200 m LVOT/AV VTI ratio: 0.65  AORTA Ao Root diam: 3.50 cm MITRAL VALVE                TRICUSPID VALVE MV Area (PHT): 3.43 cm     TR Peak grad:   32.7 mmHg MV Area VTI:   1.96 cm     TR Vmax:        286.00 cm/s MV Peak grad:  5.6 mmHg MV Mean grad:  3.0 mmHg     SHUNTS MV Vmax:       1.18 m/s     Systemic VTI:  0.20 m MV Vmean:      83.7 cm/s    Systemic Diam: 2.00 cm MV Decel Time: 221 msec     Pulmonic VTI:  0.168 m MV E velocity: 110.00 cm/s MV A velocity: 98.50 cm/s MV E/A ratio:  1.12 Alan Cowman MD Electronically signed by Alan Cowman MD Signature Date/Time: 06/23/2021/1:23:11 PM    Final    [place] EKG SITE RITE  Result Date: 07/21/2021 If Site Rite image not attached, placement could not be confirmed due to current cardiac rhythm.    TELEMETRY reviewed by me: Initially A-fib with RVR with rates 140s-160s, converted to sinus rhythm postintubation with rates in the 60s.  EKG reviewed by me: initially sinus rhythm LAFB, AF RVR rate 140s  ASSESSMENT AND PLAN:  Alan Stafford is a 76 year old male with a past medical history of NSTEMI s/p recent CABG x3 (SVG-OM-diagonal, LIMA-LAD) and left atrial appendage ligation (0000000, Dr. Silvio Stafford), HFpEF  (0000000 LVEF 55-60%, w/o WMA's  moderate MR), paroxysmal atrial fibrillation/flutter s/p ablation (2012, previously on flecainide, recently started on amiodarone), bilateral carotid artery stenosis, hypertension, hyperlipidemia, type 2 diabetes, hyperthyroidism on MMI, CKD 3, COPD, CVA, chronic pain who presented to [facility] ED from short-term rehab on 07/02/2021 with shortness of breath and productive cough that has been worsening over the past week failing outpatient antibiotic treatment for presumed pneumonia that was prescribed at rehab.  He was admitted to stepdown on BiPAP, but decompensated and required intubation. Was in A-fib with RVR, now in sinus. Hypotensive, started on phenylephrine. Cardiology is consulted for assistance with his heart failure.   #Acute hypoxic respiratory failure 2/2 suspected pneumonia  # Multisystem organ failure #Acute on chronic HFpEF (LVEF 55-60%, moderate MR) The patient is ~3 weeks postop CABG x3 with a 2-week hospital stay coming from rehab facility.  His family notes he initially was feeling well and stronger for the first week of rehab but noted the past 4 days he became more short of breath.  He was prescribed antibiotics at his rehab facility for presumed pneumonia, but continued to require oxygen and have a productive cough, feeling weak.  He has leukocytosis to 17 but lactate is normal.  BNP is elevated to 1000 and chest x-ray shows significant opacity in the right lung throughout and left lower lung concerning for pneumonia or edema, but not significantly clinically volume overloaded appearing. May have some interstitial lung disease underlying.  -Agree with current therapy per PCCM -Continue vasopressor support, wean as tolerated. Favor discontinuing phenylephrine in favor or NE -Renal function is worsening. Poor UOP. Recommend renal consultation.  -  Echo shows mildly decreased EF, with elevated RV pressure.   #CAD s/p CABG x 3 on [id number] (Dr. Silvio Stafford,  [facility]) #Elevated troponin likely demand Prolonged hospital stay postoperatively complicated by AF with RVR significant deconditioning, was discharged to short-term rehab.  Troponin minimally elevated and flat trending 44-46-54, likely demand in the setting of respiratory distress and initial rapid heart rate. -Continue aspirin 81 mg daily -Continue Zetia, previously reported intolerance to statins, unclear of which statin he took. Consider retrial.  #Paroxysmal atrial fibrillation with RVR Came in with A-fib RVR rates in the 160s, rate improved and converted to sinus rhythm in the 60s-70s after intubation. -Was previously on flecainide, discontinued after concern for toxicity with QRS widening during his admission at [facility] and was started on amiodarone 200 mg once a day. -ok with holding amiodarone for now -Continue metoprolol tartrate 12.5 twice daily - On coumadin. INR being followed closely  #CKD 3 baseline between 1.5-1.8, GFR 40-45   Signed: Andrez Grime , MD 07/22/2021, 8:38 AM [facility] Cardiology

## 2021-07-22 NOTE — Progress Notes (Signed)
ANTICOAGULATION CONSULT NOTE  Pharmacy Consult for warfarin Indication: atrial fibrillation  Patient Measurements: Height: 6\' 2"  (188 cm) Weight: 94.8 kg (209 lb) IBW/kg (Calculated) : 82.2  Labs: Recent Labs    07/24/21 1740 Jul 24, 2021 2333 07/20/21 0610 07/21/21 0547 07/22/21 0316  HGB 8.5*  --  8.7* 8.2* 7.2*  HCT 28.0*  --  29.3* 29.1* 25.1*  PLT 430*  --  467* 363 324  APTT 38*  --   --   --   --   LABPROT 27.5*  --  30.9* 29.1* 30.5*  INR 2.6*  --  3.0* 2.8* 2.9*  CREATININE 1.71*  --  2.06* 3.06* 3.98*  TROPONINIHS 44* 46* 54*  --   --     Estimated Creatinine Clearance: 18.6 mL/min (A) (by C-G formula based on SCr of 3.98 mg/dL (H)).  Medical History: Past Medical History:  Diagnosis Date   A-fib (HCC)    Abnormal heart rhythm    Arthritis    Asthma    CAP (community acquired pneumonia) 04/22/2016   Diabetes mellitus without complication (HCC)    Dysrhythmia    afib treated with flecainide   GERD (gastroesophageal reflux disease)    Hemoptysis 04/22/2016   Hypercholesteremia    Hypertension    Hyperthyroidism    Kidney stones    Sepsis (HCC) 04/22/2016    Assessment: 75yo with past medical history of HFpEF, paroxysmal atrial fibrillation (on warfarin, metoprolol, and flecainide), bilateral carotid stenosis who presented to the ED with SOB. Pharmacy is asked to dose warfarin while inpatient. INR is currently therapeutic on admission  Last warfarin regimen as follows: 3 mg on MoWeFr and 1.5 mg SuTuThSa  Date INR Plan  5/25 2.6 1 mg  5/26 3 Hold  5/27 2.8 1.5 mg  5/28 2.9 1 mg   DDIs: cefepime, linezolid Albumin 2.3, mild transaminitis  Goal of Therapy:  INR 2-3 Monitor platelets by anticoagulation protocol: Yes   Plan:  --Will order warfarin 1 mg x 1 for tonight --Suspect increased warfarin sensitivity secondary to DDIs, liver dysfunction, and lack of nutrition --Daily INR per protocol --CBC at least every 3 days per protocol  Collan Schoenfeld A  Ezabella Teska 07/22/2021,8:38 AM

## 2021-07-22 NOTE — Progress Notes (Addendum)
Uneventful day. Patient weaned to extubate but wife and daughters not ready to extubate without re intubation. Dr. Earlie Server and nurse both discussed patient's possible inability to stay extubated. Reinforced that patient was on LIFE SUPPORT. He was unable to breathe without ventilator and IV drip was keeping blood pressure up. Patient re sedated and given pain medication per family request. Youngest daughter ask nurse to please not talk negatively about her dads recovery. Although the family realized he may not recover they were not ready to hear he was on life support. She stated the family's faith was strong and they believed he would get better. 1730 Resting quietly. Family requested he not be turned.

## 2021-07-23 DIAGNOSIS — R0602 Shortness of breath: Secondary | ICD-10-CM | POA: Diagnosis not present

## 2021-07-23 DIAGNOSIS — Z7189 Other specified counseling: Secondary | ICD-10-CM

## 2021-07-23 LAB — RESPIRATORY PANEL BY PCR

## 2021-07-23 LAB — GLUCOSE, CAPILLARY
Glucose-Capillary: 184 mg/dL — ABNORMAL HIGH (ref 70–99)
Glucose-Capillary: 173 mg/dL — ABNORMAL HIGH (ref 70–99)
Glucose-Capillary: 213 mg/dL — ABNORMAL HIGH (ref 70–99)
Glucose-Capillary: 172 mg/dL — ABNORMAL HIGH (ref 70–99)
Glucose-Capillary: 171 mg/dL — ABNORMAL HIGH (ref 70–99)
Glucose-Capillary: 223 mg/dL — ABNORMAL HIGH (ref 70–99)

## 2021-07-23 LAB — RHEUMATOID FACTOR: Rheumatoid fact SerPl-aCnc: 31.9 IU/mL — ABNORMAL HIGH (ref <14.0)

## 2021-07-23 LAB — BASIC METABOLIC PANEL
Anion gap: 11 (ref 5–15)
BUN: 87 mg/dL — ABNORMAL HIGH (ref 8–23)
CO2: 22 mmol/L (ref 22–32)
Calcium: 8.1 mg/dL — ABNORMAL LOW (ref 8.9–10.3)
Chloride: 104 mmol/L (ref 98–111)
Creatinine, Ser: 3.96 mg/dL — ABNORMAL HIGH (ref 0.61–1.24)
GFR, Estimated: 15 mL/min — ABNORMAL LOW (ref >60)
Glucose, Bld: 218 mg/dL — ABNORMAL HIGH (ref 70–99)
Potassium: 4.8 mmol/L (ref 3.5–5.1)
Sodium: 137 mmol/L (ref 135–145)

## 2021-07-23 LAB — PREPARE RBC (CROSSMATCH)

## 2021-07-23 LAB — CBC
HCT: 23.8 % — ABNORMAL LOW (ref 39.0–52.0)
Hemoglobin: 6.8 g/dL — ABNORMAL LOW (ref 13.0–17.0)
MCH: 25.9 pg — ABNORMAL LOW (ref 26.0–34.0)
MCHC: 28.6 g/dL — ABNORMAL LOW (ref 30.0–36.0)
MCV: 90.5 fL (ref 80.0–100.0)
Platelets: 283 10*3/uL (ref 150–400)
RBC: 2.63 MIL/uL — ABNORMAL LOW (ref 4.22–5.81)
RDW: 17.1 % — ABNORMAL HIGH (ref 11.5–15.5)
WBC: 8.1 10*3/uL (ref 4.0–10.5)
nRBC: 1.7 % — ABNORMAL HIGH (ref 0.0–0.2)

## 2021-07-23 LAB — HEMOGLOBIN AND HEMATOCRIT, BLOOD
HCT: 22.5 % — ABNORMAL LOW (ref 39.0–52.0)
HCT: 25.4 % — ABNORMAL LOW (ref 39.0–52.0)
HCT: 26.2 % — ABNORMAL LOW (ref 39.0–52.0)
Hemoglobin: 6.3 g/dL — ABNORMAL LOW (ref 13.0–17.0)
Hemoglobin: 7.9 g/dL — ABNORMAL LOW (ref 13.0–17.0)
Hemoglobin: 8.2 g/dL — ABNORMAL LOW (ref 13.0–17.0)

## 2021-07-23 LAB — ANA COMPREHENSIVE PANEL
Anti JO-1: <0.2 AI (ref 0.0–0.9)
Centromere Ab Screen: <0.2 AI (ref 0.0–0.9)
Chromatin Ab SerPl-aCnc: <0.2 AI (ref 0.0–0.9)
ENA SM Ab Ser-aCnc: <0.2 AI (ref 0.0–0.9)
Ribonucleic Protein: 0.2 AI (ref 0.0–0.9)
SSA (Ro) (ENA) Antibody, IgG: <0.2 AI (ref 0.0–0.9)
SSB (La) (ENA) Antibody, IgG: <0.2 AI (ref 0.0–0.9)
Scleroderma (Scl-70) (ENA) Antibody, IgG: <0.2 AI (ref 0.0–0.9)
ds DNA Ab: 1 IU/mL (ref 0–9)

## 2021-07-23 LAB — PHOSPHORUS: Phosphorus: 7.9 mg/dL — ABNORMAL HIGH (ref 2.5–4.6)

## 2021-07-23 LAB — PROTIME-INR
INR: 2.7 — ABNORMAL HIGH (ref 0.8–1.2)
Prothrombin Time: 28.3 seconds — ABNORMAL HIGH (ref 11.4–15.2)

## 2021-07-23 LAB — ANGIOTENSIN CONVERTING ENZYME: Angiotensin-Converting Enzyme: 28 U/L (ref 14–82)

## 2021-07-23 LAB — MAGNESIUM: Magnesium: 2.7 mg/dL — ABNORMAL HIGH (ref 1.7–2.4)

## 2021-07-23 LAB — ABO/RH: ABO/RH(D): A POS

## 2021-07-23 MED ORDER — FENTANYL CITRATE PF 50 MCG/ML IJ SOSY
25.0000 ug | PREFILLED_SYRINGE | INTRAMUSCULAR | Status: DC | PRN
  Filled 2021-07-23: qty 1

## 2021-07-23 MED ORDER — METHYLPREDNISOLONE SODIUM SUCC 125 MG IJ SOLR
80.0000 mg | Freq: Two times a day (BID) | INTRAMUSCULAR | Status: DC
Start: 2021-07-23 — End: 2021-07-24
  Administered 2021-07-23 – 2021-07-24 (×2): 80 mg via INTRAVENOUS
  Filled 2021-07-23 (×2): qty 2

## 2021-07-23 MED ORDER — SODIUM CHLORIDE 0.9% IV SOLUTION: Freq: Once | INTRAVENOUS | Status: AC

## 2021-07-23 MED ORDER — WARFARIN SODIUM 1 MG PO TABS
1.0000 mg | ORAL_TABLET | Freq: Once | ORAL | Status: DC
  Filled 2021-07-23: qty 1

## 2021-07-23 MED ORDER — FENTANYL CITRATE PF 50 MCG/ML IJ SOSY
25.0000 ug | PREFILLED_SYRINGE | INTRAMUSCULAR | Status: DC | PRN
  Administered 2021-07-23 – 2021-07-25 (×4): 50 ug via INTRAVENOUS
  Filled 2021-07-23 (×3): qty 1

## 2021-07-23 NOTE — Progress Notes (Addendum)
[facility]  ROUNDING NOTE   Subjective:   Patient remains critically ill Appears restless, sedation propofol Multiple family members at bedside, tearful Vent settings 30% FiO2 with 10 PEEP No pressors Remains n.p.o. Foley catheter in place-1.8 L of urine output recorded   Objective:  Vital signs in last 24 hours:  Temp:  [96.3 F (35.7 C)-99 F (37.2 C)] 98.5 F (36.9 C) (05/29 1145) Pulse Rate:  [31-116] 100 (05/29 1131) Resp:  [9-25] 23 (05/29 1131) BP: (98-156)/(56-100) 123/77 (05/29 1131) SpO2:  [86 %-98 %] 98 % (05/29 1131) FiO2 (%):  [30 %] 30 % (05/29 0917)  Weight change:  Filed Weights   07/08/2021 1714  Weight: 94.8 kg    Intake/Output: I/O last 3 completed shifts: In: 2627.5 [I.V.:1308.5; NG/GT:100; IV Piggyback:1219] Out: 2160 [Urine:2130; Emesis/NG output:30]   Intake/Output this shift:  Total I/O In: 379.8 [I.V.:21.9; Blood:357.9] Out: 50 [Urine:50]  Physical Exam: General: Restless, ill-appearing  Head: Normocephalic, atraumatic.   Eyes: Anicteric  Lungs:  Intubated on ventilator  Heart: Irregular rate and rhythm  Abdomen:  Soft, nontender, nondistended  Extremities: No peripheral edema.  Neurologic: Sedated, unable to follow commands  Skin: No lesions  Access: None    Basic Metabolic Panel: Recent Labs  Lab 07/10/2021 1740 07/20/21 0610 07/21/21 0547 07/22/21 0316 07/23/21 0344  NA 134* 137 135 136 137  K 4.3 4.6 6.1* 5.2* 4.8  CL 103 105 101 103 104  CO2 _0 GLUCOSE 144* 104* 220* 192* 218*  BUN 27* 30* 48* 70* 87*  CREATININE 1.71* 2.06* 3.06* 3.98* 3.96*  CALCIUM 8.3* 8.2* 8.3* 7.8* 8.1*  MG  --  1.9 2.3 2.3 2.7*  PHOS  --  4.7* 8.7* 8.2* 7.9*    Liver Function Tests: Recent Labs  Lab 07/09/2021 1740 07/20/21 0610 07/22/21 0316  AST 84* 126* 80*  ALT 40 54* 47*  ALKPHOS 101 129* 103  BILITOT 0.9 1.6* 1.2  PROT 7.5 7.7 7.0  ALBUMIN 2.3* 2.3* 2.3*   No results for input(s): LIPASE, AMYLASE in  the last 168 hours. No results for input(s): AMMONIA in the last 168 hours.  CBC: Recent Labs  Lab 07/15/2021 1740 07/20/21 0610 07/21/21 0547 07/22/21 0316 07/23/21 0344  WBC 12.3* 16.7* 13.5* 10.4 8.1  NEUTROABS 10.5* 14.7* 12.4*  --   --   HGB 8.5* 8.7* 8.2* 7.2* 6.8*  HCT 28.0* 29.3* 29.1* 25.1* 23.8*  MCV 87.8 88.5 91.5 90.9 90.5  PLT 430* 467* 363 324 283    Cardiac Enzymes: No results for input(s): CKTOTAL, CKMB, CKMBINDEX, TROPONINI in the last 168 hours.  BNP: Invalid input(s): POCBNP  CBG: Recent Labs  Lab 07/22/21 1934 07/22/21 2308 07/23/21 0321 07/23/21 0727 07/23/21 1120  GLUCAP 218* 239* 184* 173* 213*    Microbiology: Results for orders placed or performed during the hospital encounter of 06/27/2021  Blood Culture (routine x 2)     Status: None (Preliminary result)   Collection Time: 07/06/2021  5:55 PM   Specimen: BLOOD  Result Value Ref Range Status   Specimen Description BLOOD RIGHT FOREARM  Final   Special Requests   Final    BOTTLES DRAWN AEROBIC AND ANAEROBIC Blood Culture results may not be optimal due to an inadequate volume of blood received in culture bottles   Culture   Final    NO GROWTH 4 DAYS Performed at [facility], [address] [place], [place] [address]    Report Status PENDING  Incomplete  Blood Culture (routine x 2)     Status: None (Preliminary result)   Collection Time: 07/15/2021  5:58 PM   Specimen: BLOOD  Result Value Ref Range Status   Specimen Description BLOOD RIGHT FOREARM  Final   Special Requests   Final    BOTTLES DRAWN AEROBIC AND ANAEROBIC Blood Culture adequate volume   Culture   Final    NO GROWTH 4 DAYS Performed at [facility], [address], [place], [place] [address]    Report Status PENDING  Incomplete  SARS Coronavirus 2 by RT PCR (hospital order, performed in [facility] hospital lab) *cepheid single result test* Anterior Nasal Swab     Status: None   Collection Time: 07/12/2021   6:57 PM   Specimen: Anterior Nasal Swab  Result Value Ref Range Status   SARS Coronavirus 2 by RT PCR NEGATIVE NEGATIVE Final    Comment: (NOTE) SARS-CoV-2 target nucleic acids are NOT DETECTED.  The SARS-CoV-2 RNA is generally detectable in upper and lower respiratory specimens during the acute phase of infection. The lowest concentration of SARS-CoV-2 viral copies this assay can detect is 250 copies / mL. A negative result does not preclude SARS-CoV-2 infection and should not be used as the sole basis for treatment or other patient management decisions.  A negative result may occur with improper specimen collection / handling, submission of specimen other than nasopharyngeal swab, presence of viral mutation(s) within the areas targeted by this assay, and inadequate number of viral copies (<250 copies / mL). A negative result must be combined with clinical observations, patient history, and epidemiological information.  Fact Sheet for Patients:   https://www.patel.info/  Fact Sheet for Healthcare Providers: https://hall.com/  This test is not yet approved or  cleared by the [place] FDA and has been authorized for detection and/or diagnosis of SARS-CoV-2 by FDA under an Emergency Use Authorization (EUA).  This EUA will remain in effect (meaning this test can be used) for the duration of the COVID-19 declaration under Section 564(b)(1) of the Act, 21 U.S.C. section 360bbb-3(b)(1), unless the authorization is terminated or revoked sooner.  Performed at [facility], [address] [place], [place] [address]   MRSA Next Gen by PCR, Nasal     Status: Abnormal   Collection Time: 07/13/2021 11:33 PM   Specimen: Nasal Swab  Result Value Ref Range Status   MRSA by PCR Next Gen DETECTED (A) NOT DETECTED Final    Comment: RESULT CALLED TO, READ BACK BY AND VERIFIED WITH: ANTACIA BELL _0  ON5/26/23 SKL (NOTE) The GeneXpert  MRSA Assay (FDA approved for NASAL specimens only), is one component of a comprehensive MRSA colonization surveillance program. It is not intended to diagnose MRSA infection nor to guide or monitor treatment for MRSA infections. Test performance is not FDA approved in patients less than 37 years old. Performed at [facility], [address] [place], [place] [address]   Culture, Respiratory w Gram Stain     Status: None   Collection Time: 07/20/21  6:19 AM   Specimen: Tracheal Aspirate; Respiratory  Result Value Ref Range Status   Specimen Description   Final    TRACHEAL ASPIRATE Performed at [facility], [address], [place], [place] [address]    Special Requests   Final    NONE Performed at [facility], [address] [place], [place] [address]    Gram Stain   Final    FEW WBC PRESENT, PREDOMINANTLY  PMN NO ORGANISMS SEEN    Culture   Final    RARE Normal respiratory flora-no Staph aureus or Pseudomonas seen Performed at [facility] [address], [place], [place] [address]    Report Status 07/22/2021 FINAL  Final    Coagulation Studies: Recent Labs    07/21/21 0547 07/22/21 0316 07/23/21 0344  LABPROT 29.1* 30.5* 28.3*  INR 2.8* 2.9* 2.7*    Urinalysis: No results for input(s): COLORURINE, LABSPEC, PHURINE, GLUCOSEU, HGBUR, BILIRUBINUR, KETONESUR, PROTEINUR, UROBILINOGEN, NITRITE, LEUKOCYTESUR in the last 72 hours.  Invalid input(s): APPERANCEUR    Imaging: DG Abd 1 View  Result Date: 07/21/2021 CLINICAL DATA:  Endotracheal tube adjusted. Nasogastric tube placement. EXAM: PORTABLE CHEST 1 VIEW COMPARISON:  AP chest 07/21/2021 at 0836 hours; upper abdominal radiograph 07/20/2021 FINDINGS: AP chest 07/21/2021 at 951 hours: Endotracheal tube tip terminates approximately 3.0 cm above the carina, appearing at the superior aspect of the clavicular heads. This appears grossly similar to prior. Measurement tool on the current  radiographs is not calibrated, and the tip of the tube measures approximately 2.0 cm from the carina compared to 4.5 cm previously however again these measurements are likely non comparable. Enteric tube again descends below the diaphragm with the tip excluded by collimation. Status post median sternotomy and CABG. Atrial clipping device is again seen. There is again extensive bilateral interstitial thickening and airspace density. No pneumothorax is seen. -- KUB 07/21/2021 at 0952 hours: Enteric tube descends below the diaphragm and curls along the greater curvature of the stomach with the tip overlying the distal stomach. Nonobstructed bowel-gas pattern. IMPRESSION: The endotracheal tube tip again appears to be at the superior aspect of the clavicular heads. No significant change in diffuse airspace disease. Nasogastric tube tip overlies the distal stomach. Electronically Signed   By: Yvonne Kendall M.D.   On: 07/21/2021 22:09   DG Chest Port 1 View  Result Date: 07/21/2021 CLINICAL DATA:  Endotracheal tube adjusted. Nasogastric tube placement. EXAM: PORTABLE CHEST 1 VIEW COMPARISON:  AP chest 07/21/2021 at 0836 hours; upper abdominal radiograph 07/20/2021 FINDINGS: AP chest 07/21/2021 at 951 hours: Endotracheal tube tip terminates approximately 3.0 cm above the carina, appearing at the superior aspect of the clavicular heads. This appears grossly similar to prior. Measurement tool on the current radiographs is not calibrated, and the tip of the tube measures approximately 2.0 cm from the carina compared to 4.5 cm previously however again these measurements are likely non comparable. Enteric tube again descends below the diaphragm with the tip excluded by collimation. Status post median sternotomy and CABG. Atrial clipping device is again seen. There is again extensive bilateral interstitial thickening and airspace density. No pneumothorax is seen. -- KUB 07/21/2021 at 0952 hours: Enteric tube descends below  the diaphragm and curls along the greater curvature of the stomach with the tip overlying the distal stomach. Nonobstructed bowel-gas pattern. IMPRESSION: The endotracheal tube tip again appears to be at the superior aspect of the clavicular heads. No significant change in diffuse airspace disease. Nasogastric tube tip overlies the distal stomach. Electronically Signed   By: Yvonne Kendall M.D.   On: 07/21/2021 22:09     Medications:    albumin human     ceFEPime (MAXIPIME) IV 2 g (07/23/21 1000)   linezolid (ZYVOX) IV 600 mg (07/23/21 1058)   propofol (DIPRIVAN) infusion 50 mcg/kg/min (07/23/21 1131)    aspirin  81 mg Per Tube Daily   chlorhexidine gluconate (MEDLINE KIT)  15 mL Mouth Rinse  BID   Chlorhexidine Gluconate Cloth  6 each Topical Daily   Chlorhexidine Gluconate Cloth  6 each Topical Q0600   docusate  100 mg Per Tube BID   escitalopram  20 mg Per Tube Daily   ezetimibe  10 mg Per Tube Daily   insulin aspart  0-20 Units Subcutaneous Q4H   mouth rinse  15 mL Mouth Rinse 10 times per day   methimazole  10 mg Per Tube Daily   methylPREDNISolone (SOLU-MEDROL) injection  80 mg Intravenous Q12H   metoprolol tartrate  12.5 mg Per Tube BID   mupirocin ointment  1 application. Nasal BID   pantoprazole sodium  40 mg Per Tube Daily   polyethylene glycol  17 g Per Tube Daily   pregabalin  25 mg Per Tube TID   sodium chloride flush  10-40 mL Intracatheter Q12H   warfarin  1 mg Per Tube ONCE-1600   Warfarin - Pharmacist Dosing Inpatient   Does not apply q1600   acetaminophen **OR** acetaminophen, albuterol, hydrALAZINE, HYDROcodone-acetaminophen, HYDROmorphone (DILAUDID) injection, midazolam, prochlorperazine, sodium chloride flush  Assessment/ Plan:  Mr. EH SESAY is a 76 y.o.  male with medical problems of coronary artery disease with history of non-STEMI, CABG x3 (May 2023 @ [facility]), atrial fibrillation/flutter status post ablation, bilateral carotid artery stenosis, hypertension,  hyperlipidemia, type 2 diabetes, hypothyroidism, CKD, COPD, history of stroke, chronic pain  was admitted on 07/07/2021 for :SOB (shortness of breath) [R06.02] Acute respiratory failure with hypoxia ([place]) [J96.01] Community acquired pneumonia of right lung, unspecified part of lung [J18.9] Hypoxia [R09.02]   Acute kidney injury with Hyperkalemia Baseline creatinine of 1.6/GFR 45 from Jun 25, 2021 Current urine output of about 500 cc yesterday  Creatinine trends worsening since May 25.  It has now increased to 3.98. AKI likely secondary to ATN from hypotension caused by hemodynamic instability and from pneumonia.  Renal function has stabilized with creatinine 3.96.  BUN increased to 87.  Adequate urine output recorded of 1.8 L.  We will hold on initiation of CRRT or dialysis at this time.  However monitoring closely.  Lab Results  Component Value Date   CREATININE 3.96 (H) 07/23/2021   CREATININE 3.98 (H) 07/22/2021   CREATININE 3.06 (H) 07/21/2021    Intake/Output Summary (Last 24 hours) at 07/23/2021 1149 Last data filed at 07/23/2021 1100 Gross per 24 hour  Intake 1420.15 ml  Output 1850 ml  Net -429.85 ml   2.  Acute respiratory failure.  Remains intubated on ventilator.  ICU team to manage.  2.  Anemia.  Hemoglobin trending down since admission.  Currently 6.8, recheck 6.3.  Patient receiving 1 unit of red blood cells.    LOS: 4 [place] 5/29/202311:49 AM

## 2021-07-23 NOTE — Progress Notes (Signed)
[facility]  SUBJECTIVE: Patient intubated   Vitals:   07/23/21 0917 07/23/21 0939 07/23/21 1000 07/23/21 1013  BP:   (!) 156/100 (!) 156/100  Pulse:  (!) 116 (!) 106 92  Resp:   (!) 25 (!) 22  Temp:   98.8 F (37.1 C) 98.8 F (37.1 C)  TempSrc:    Esophageal  SpO2: 94%  98% 98%  Weight:      Height:         Intake/Output Summary (Last 24 hours) at 07/23/2021 1034 Last data filed at 07/23/2021 1012 Gross per 24 hour  Intake 1420.15 ml  Output 1800 ml  Net -379.85 ml      PHYSICAL EXAM  General: Well developed, well nourished, in no acute distress HEENT:  Normocephalic and atramatic Neck:  No JVD.  Lungs: Clear bilaterally to auscultation and percussion. Heart: HRRR . Normal S1 and S2 without gallops or murmurs.  Abdomen: Bowel sounds are positive, abdomen soft and non-tender  Msk:  Back normal, normal gait. Normal strength and tone for age. Extremities: No clubbing, cyanosis or edema.   Neuro: Alert and oriented X 3. Psych:  Good affect, responds appropriately   LABS: Basic Metabolic Panel: Recent Labs    07/22/21 0316 07/23/21 0344  NA 136 137  K 5.2* 4.8  CL 103 104  CO2 22 22  GLUCOSE 192* 218*  BUN 70* 87*  CREATININE 3.98* 3.96*  CALCIUM 7.8* 8.1*  MG 2.3 2.7*  PHOS 8.2* 7.9*   Liver Function Tests: Recent Labs    07/22/21 0316  AST 80*  ALT 47*  ALKPHOS 103  BILITOT 1.2  PROT 7.0  ALBUMIN 2.3*   No results for input(s): LIPASE, AMYLASE in the last 72 hours. CBC: Recent Labs    07/21/21 0547 07/22/21 0316 07/23/21 0344  WBC 13.5* 10.4 8.1  NEUTROABS 12.4*  --   --   HGB 8.2* 7.2* 6.8*  HCT 29.1* 25.1* 23.8*  MCV 91.5 90.9 90.5  PLT 363 324 283   Cardiac Enzymes: No results for input(s): CKTOTAL, CKMB, CKMBINDEX, TROPONINI in the last 72 hours. BNP: Invalid input(s): POCBNP D-Dimer: No results for input(s): DDIMER in the last 72 hours. Hemoglobin A1C: No results for input(s): HGBA1C in the last 72 hours. Fasting Lipid  Panel: Recent Labs    07/21/21 0547  TRIG 121   Thyroid Function Tests: No results for input(s): TSH, T4TOTAL, T3FREE, THYROIDAB in the last 72 hours.  Invalid input(s): FREET3 Anemia Panel: No results for input(s): VITAMINB12, FOLATE, FERRITIN, TIBC, IRON, RETICCTPCT in the last 72 hours.  DG Abd 1 View  Result Date: 07/21/2021 CLINICAL DATA:  Endotracheal tube adjusted. Nasogastric tube placement. EXAM: PORTABLE CHEST 1 VIEW COMPARISON:  AP chest 07/21/2021 at 0836 hours; upper abdominal radiograph 07/20/2021 FINDINGS: AP chest 07/21/2021 at 951 hours: Endotracheal tube tip terminates approximately 3.0 cm above the carina, appearing at the superior aspect of the clavicular heads. This appears grossly similar to prior. Measurement tool on the current radiographs is not calibrated, and the tip of the tube measures approximately 2.0 cm from the carina compared to 4.5 cm previously however again these measurements are likely non comparable. Enteric tube again descends below the diaphragm with the tip excluded by collimation. Status post median sternotomy and CABG. Atrial clipping device is again seen. There is again extensive bilateral interstitial thickening and airspace density. No pneumothorax is seen. -- KUB 07/21/2021 at 0952 hours: Enteric tube descends below the diaphragm and curls along the greater  curvature of the stomach with the tip overlying the distal stomach. Nonobstructed bowel-gas pattern. IMPRESSION: The endotracheal tube tip again appears to be at the superior aspect of the clavicular heads. No significant change in diffuse airspace disease. Nasogastric tube tip overlies the distal stomach. Electronically Signed   By: Neita Garnet M.D.   On: 07/21/2021 22:09   DG Chest Port 1 View  Result Date: 07/21/2021 CLINICAL DATA:  Endotracheal tube adjusted. Nasogastric tube placement. EXAM: PORTABLE CHEST 1 VIEW COMPARISON:  AP chest 07/21/2021 at 0836 hours; upper abdominal radiograph  07/20/2021 FINDINGS: AP chest 07/21/2021 at 951 hours: Endotracheal tube tip terminates approximately 3.0 cm above the carina, appearing at the superior aspect of the clavicular heads. This appears grossly similar to prior. Measurement tool on the current radiographs is not calibrated, and the tip of the tube measures approximately 2.0 cm from the carina compared to 4.5 cm previously however again these measurements are likely non comparable. Enteric tube again descends below the diaphragm with the tip excluded by collimation. Status post median sternotomy and CABG. Atrial clipping device is again seen. There is again extensive bilateral interstitial thickening and airspace density. No pneumothorax is seen. -- KUB 07/21/2021 at 0952 hours: Enteric tube descends below the diaphragm and curls along the greater curvature of the stomach with the tip overlying the distal stomach. Nonobstructed bowel-gas pattern. IMPRESSION: The endotracheal tube tip again appears to be at the superior aspect of the clavicular heads. No significant change in diffuse airspace disease. Nasogastric tube tip overlies the distal stomach. Electronically Signed   By: Neita Garnet M.D.   On: 07/21/2021 22:09   [place] EKG SITE RITE  Result Date: 07/21/2021 If Site Rite image not attached, placement could not be confirmed due to current cardiac rhythm.    Echo LVEF 45 to 50%, RV function mildly reduced, moderate RV enlargement  TELEMETRY: Atrial fibrillation 90 to 100 bpm:  ASSESSMENT AND PLAN:  Principal Problem:   SOB (shortness of breath) Active Problems:   CAD in native artery   Hyperthyroidism   Paroxysmal atrial fibrillation (HCC)   Type 2 diabetes mellitus with vascular disease (HCC)   Pneumonia   Acute on chronic systolic congestive heart failure (HCC)   Chronic anticoagulation (Coumadin)   Sepsis (HCC)   Acute respiratory failure with hypoxia (HCC)   Pressure injury of skin   Hypoxia    1.  Respiratory failure,  likely multifactorial, secondary to pneumonia, acute on chronic HFpEF, with underlying interstitial lung disease by CT scan 2.  CAD, status post CABG x 3, 06/27/2021 at [facility], patient was doing well, enrolled in cardiac rehabilitation, without recent history of chest pain 3.  Borderline elevated troponin, (44, 46, 54), likely demand supply ischemia, in the absence of chest pain or new ECG changes, 4.  Paroxysmal atrial fibrillation, with rapid ventricular rate, rate reasonably controlled, on metoprolol tartrate per tube 5.  AKI/CKD, BUN and creatinine 48 and 3.06, GFR 21 6.  Multisystem organ failure  Recommendations  1.  Continue current medications 2.  Continue metoprolol to tartrate for rate and rhythm control, uptitrate as needed 3.  Furosemide IV as needed 4.  Defer further cardiac diagnostics at this time   Marcina Millard, MD, PhD, [facility] 07/23/2021 10:34 AM

## 2021-07-23 NOTE — Progress Notes (Signed)
Pharmacy Antibiotic Note  Alan Stafford is a 76 y.o. male w/ PMH of paroxysmal A-fib, diabetes mellitus with complications of stage III chronic kidney disease, hypertension, hypothyroidism, depression, CAD admitted on 06/27/2021 with pneumonia.  Pharmacy has been consulted for cefepime dosing.  -vancomycin changed to linezolid 5/26  Plan: -continue cefepime 2 grams IV every 24 hours for  crcl 18.7 ml/min    Height: 6\' 2"  (188 cm) Weight: 94.8 kg (209 lb) IBW/kg (Calculated) : 82.2  Temp (24hrs), Avg:97.7 F (36.5 C), Min:96.3 F (35.7 C), Max:99.3 F (37.4 C)  Recent Labs  Lab 07/17/2021 1740 07/02/2021 2333 07/20/21 0610 07/21/21 0547 07/22/21 0316 07/23/21 0344  WBC 12.3*  --  16.7* 13.5* 10.4 8.1  CREATININE 1.71*  --  2.06* 3.06* 3.98* 3.96*  LATICACIDVEN 1.2 0.9  --   --   --   --      Estimated Creatinine Clearance: 18.7 mL/min (A) (by C-G formula based on SCr of 3.96 mg/dL (H)).    Allergies  Allergen Reactions   Statins Other (See Comments)    Leg weakness   Sulfa Antibiotics Rash    Antimicrobials this admission: 05/25 azithromycin x 1 dose 05/25 vancomycin >> 5/26 05/25 cefepime >>  5/26 Linezolid >>  Microbiology results: 05/25 BCx: [id number] 5/26  trach aspirate: normal flora 5/25 MRSA PCR+   Thank you for allowing pharmacy to be a part of this patient's care.  Donnabelle Blanchard A 07/23/2021 9:14 AM

## 2021-07-23 NOTE — Progress Notes (Signed)
NAME:  Alan Stafford, MRN:  [id number], DOB:  May 07, 1945, LOS: 4 ADMISSION DATE:  07/02/2021, CONSULTATION DATE:  07/20/21 REFERRING MD:  Dr. Nevada Crane, CHIEF COMPLAINT:  Shortness of breath   History of Present Illness:  76 year old male presenting to [facility] ED from rehab via EMS due to worsening shortness of breath, productive cough, acute oxygen requirement of 3.5 L nasal cannula that has been steadily increasing since 07/16/2021 and fatigue in the setting of recent pneumonia diagnosis.  Patient was prescribed antibiotics and prednisone in rehab for presumed pneumonia. Of note patient was recently discharged to rehab from the [facility] post CABG 06/27/2021.  Per daughter bedside she thinks her mom noted a low-grade fever when he was at the facility but no other complaints that she is aware of.  ED course: Vital signs stable on arrival, however patient remains on acute oxygen.  Imaging revealed worsening pulmonary opacities particularly in the right lung.  Lab work revealed elevated BNP with demand ischemia as troponin elevated but flat in the 40s.  Leukocytosis, however lactic WNL. Due to concern for worsening pneumonia while on antibiotics, TRH consulted for admission to inpatient. medications given: Cefepime, Lasix 20 mg, vancomycin Initial Vitals: 99 F, mildly tachypneic at 22, NSR 74, BP 119/67 and SPO2 96% on 4 L nasal cannula Significant labs: (Labs/ Imaging personally reviewed) I, Domingo Pulse [place], AGACNP-BC, personally viewed and interpreted this ECG. EKG Interpretation: Date: 07/18/2021, EKG Time: 17:09, Rate: 78, Rhythm: NSR, QRS Axis: LAD, Intervals: LAFB, ST/T Wave abnormalities: None, Narrative Interpretation: NSR with LAD & LAFB Chemistry: Na+: 134, K+: 4.3, BUN/Cr.:  27/1.71, Serum CO2/ AG: 22/9, AST: 84 Hematology: WBC: 12.3, Hgb: 8.5,  Troponin: 44> 46, BNP: 752.9, Lactic: 1.2 > 0.9, COVID-19 & Influenza A/B: Negative ABG: 7.42/38/57/24.6 CXR 07/03/2021: Increasing pulmonary opacity  throughout the right lung and in the left lower lobe that could represent developing pneumonia or asymmetric edema  Prior to moving inpatient patient's respiratory status declined, he had increased work of breathing somnolent but arousable to voice as and was placed on BiPAP support.  ABG reassuring, therefore transferred to stepdown unit on BiPAP.  TRH urgently consulted cardiology for assistance in management. PCCM consulted for assistance in management and monitoring due to worsening respiratory status with high risk for intubation. Upon arrival to ICU patient tachypneic, diaphoretic with increased work of breathing and A-fib RVR with heart rate sustaining in the 150s.  Patient also responsive but lethargic.  Daughter brought bedside explaining decision to emergently intubate and place patient on mechanical ventilatory support.   07/23/21- had lengthy conversation with family today. Patient with ILD, AF, CHF, chronic respiratory difficulty with lifelong asthma, DM, Renal failure.   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  Upon bedside assessment patient acutely lethargic with increased work of breathing tachypnea and A-fib RVR-decision made to emergently intubate and place the patient on mechanical ventilatory support.  Interim History / Subjective:  Upon arrival to ICU patient tachypneic, diaphoretic with increased work of breathing and A-fib RVR with heart rate sustaining in the 150s.  Patient also responsive but lethargic.  Daughter brought bedside explaining decision to emergently intubate and place patient on mechanical  ventilatory support.  Objective   Blood pressure (!) 156/100, pulse 92, temperature 98.8 F (37.1 C), temperature source Esophageal, resp. rate (!) 22, height 6\' 2"  (1.88 m), weight 94.8 kg, SpO2 98 %.    Vent Mode: PRVC FiO2 (%):  [30 %] 30 % Set Rate:  [20 bmp] 20 bmp Vt Set:  [500 mL] 500 mL PEEP:  [8 cmH20-10 cmH20] 8 cmH20 Pressure Support:  [10 cmH20] 10 cmH20 Plateau Pressure:  [28 cmH20-32 cmH20] 28 cmH20   Intake/Output Summary (Last 24 hours) at 07/23/2021 1042 Last data filed at 07/23/2021 1012 Gross per 24 hour  Intake 1420.15 ml  Output 1800 ml  Net -379.85 ml    Filed Weights   07/15/2021 1714  Weight: 94.8 kg    Examination: General: Adult male, diaphoretic critically, lying in bed in respiratory distress HEENT: MM pink/moist, anicteric, atraumatic, neck supple Neuro: RASS: -1, able to follow intermittent commands, PERRL +3, MAE CV: s1s2 irregular, tachycardic, A-Fib RVR in 170's on monitor, no r/m/g Pulm: Regular, tachypneic & labored on BIPAP, breath sounds coarse crackles RUL, coarse- LUL, diminished with crackles-BLL GI: soft, rounded, non tender, bs x 4 GU: foley in place with clear yellow urine Skin: DTI Right calf, blanchable redness on sacrum Extremities: warm/dry, pulses + 2 R/P, no edema noted  Resolved Hospital Problem list     Assessment & Plan:  Acute Hypoxic Respiratory Failure secondary to HCAP and suspected pulmonary edema Chronic Interstitial Dz? PMHx: Asthma                           05/2021                                                                    04/2016 - Ventilator settings: PC-Automode 10/10 as tolerated - VT has increased on PCV, Flow graphics have improved, still has initial P0.1 in excess  - Wean FiO2 as tolerated, maintain SpO2 > 90% - Head of bed elevated 30 degrees, VAP protocol in place - Plateau pressures less than 30 cm H20  - Intermittent chest x-ray & ABG PRN - Daily WUA with SBT as tolerated  - Ensure adequate  pulmonary hygiene  - F/u cultures, trend PCT - Continue HCAP coverage cefepime & linezolid - Bronchodilators PRN - Dilaudid in place of  Fentanyl while on Zyvox. Propofol drip. - Initiate peripheral phenylephrine drip in the setting of sedatives, wean as tolerated to maintain MAP > 65 - Review of several CT shows question of ongoing and chronic infiltrates some of which GGO - Send serologies and start steroids for now.  - Hold amio - Caution with volume reduction as creatinine is rising.   Elevated BNP, suspect acute HFrEF exacerbation CAD status post recent CABG (06/2021) Atrial fibrillation with RVR Recent echo 06/14/2021 showed preserved LVEF and normal diastolic function.  A-fib RVR reverted to sinus rhythm post intubation and airway support. -Consider restarting outpatient amiodarone, per cardiology recommendation (stopped by [facility] due to concerns for ARDS & amnio toxicity) -Continue warfarin per pharmacy consult - Echocardiogram preserved EF, but RV is overloaded and septum is D shaped - Continuous cardiac monitoring  - Strict I/O's: alert provider if UOP < 0.5 mL/kg/hr - Daily weights to assess volume status -  Diurese on hold for elevated creatinine   - Supplemental oxygen as needed, maintain SpO2 > 90% - Cardiology following, appreciate input  AKI on CKD stage IIIb - Daily BMP, replace electrolytes PRN - Avoid nephrotoxic agents as able, ensure adequate renal perfusion - Lokelma again today - Hold lasix, albumin dose again today - At risk for needing CRRT -If creatinine rising tomorrow will ask nephrology to see, (d/w nephro, will wait another day)  Hyperthyroidism -Continue outpatient regimen: Methimazole  - f/u TSH, T3, T4  Type 2 Diabetes Mellitus - Monitor CBG Q 4 hours - SSI moderate dosing - target range while in ICU: 140-180 - follow ICU hyper/hypo-glycemia protocol   Best Practice (right click and "Reselect all SmartList Selections" daily)  Diet/type: NPO w/  meds via tube DVT prophylaxis: DOAC GI prophylaxis: PPI Lines: N/A Foley:  Yes, and it is still needed Code Status:  full code Last date of multidisciplinary goals of care discussion [07/20/21]  Labs   CBC: Recent Labs  Lab 06/26/2021 1740 07/20/21 0610 07/21/21 0547 07/22/21 0316 07/23/21 0344  WBC 12.3* 16.7* 13.5* 10.4 8.1  NEUTROABS 10.5* 14.7* 12.4*  --   --   HGB 8.5* 8.7* 8.2* 7.2* 6.8*  HCT 28.0* 29.3* 29.1* 25.1* 23.8*  MCV 87.8 88.5 91.5 90.9 90.5  PLT 430* 467* 363 324 283     Basic Metabolic Panel: Recent Labs  Lab 07/08/2021 1740 07/20/21 0610 07/21/21 0547 07/22/21 0316 07/23/21 0344  NA 134* 137 135 136 137  K 4.3 4.6 6.1* 5.2* 4.8  CL 103 105 101 103 104  CO2 22 23 23 22 22   GLUCOSE 144* 104* 220* 192* 218*  BUN 27* 30* 48* 70* 87*  CREATININE 1.71* 2.06* 3.06* 3.98* 3.96*  CALCIUM 8.3* 8.2* 8.3* 7.8* 8.1*  MG  --  1.9 2.3 2.3 2.7*  PHOS  --  4.7* 8.7* 8.2* 7.9*    GFR: Estimated Creatinine Clearance: 18.7 mL/min (A) (by C-G formula based on SCr of 3.96 mg/dL (H)). Recent Labs  Lab 07/22/2021 1740 07/07/2021 2333 07/20/21 0610 07/21/21 0547 07/22/21 0316 07/23/21 0344  PROCALCITON  --   --  0.30  --   --   --   WBC 12.3*  --  16.7* 13.5* 10.4 8.1  LATICACIDVEN 1.2 0.9  --   --   --   --      Liver Function Tests: Recent Labs  Lab 07/07/2021 1740 07/20/21 0610 07/22/21 0316  AST 84* 126* 80*  ALT 40 54* 47*  ALKPHOS 101 129* 103  BILITOT 0.9 1.6* 1.2  PROT 7.5 7.7 7.0  ALBUMIN 2.3* 2.3* 2.3*    No results for input(s): LIPASE, AMYLASE in the last 168 hours. No results for input(s): AMMONIA in the last 168 hours.  ABG    Component Value Date/Time   PHART 7.32 (L) 07/20/2021 0619   PCO2ART 46 07/20/2021 0619   PO2ART 223 (H) 07/20/2021 0619   HCO3 23.7 07/20/2021 0619   ACIDBASEDEF 2.6 (H) 07/20/2021 0619   O2SAT 98.8 07/20/2021 0619      Coagulation Profile: Recent Labs  Lab 07/11/2021 1740 07/20/21 0610 07/21/21 0547  07/22/21 0316 07/23/21 0344  INR 2.6* 3.0* 2.8* 2.9* 2.7*     Cardiac Enzymes: No results for input(s): CKTOTAL, CKMB, CKMBINDEX, TROPONINI in the last 168 hours.  HbA1C: Hgb A1c MFr Bld  Date/Time Value Ref Range Status  07/20/2021 06:10 AM 6.3 (H) 4.8 - 5.6 % Final    Comment:    (  NOTE) Pre diabetes:          5.7%-6.4%  Diabetes:              >6.4%  Glycemic control for   <7.0% adults with diabetes   04/23/2016 02:59 AM 6.2 (H) 4.8 - 5.6 % Final    Comment:    (NOTE)         Pre-diabetes: 5.7 - 6.4         Diabetes: >6.4         Glycemic control for adults with diabetes: <7.0     CBG: Recent Labs  Lab 07/22/21 1539 07/22/21 1934 07/22/21 2308 07/23/21 0321 07/23/21 0727  GLUCAP 176* 218* 239* 184* 173*     Review of Systems:   UTA- patient lethargic and in respiratory distress  Past Medical History:  He,  has a past medical history of A-fib ([place]), Abnormal heart rhythm, Arthritis, Asthma, CAP (community acquired pneumonia) (04/22/2016), Diabetes mellitus without complication ([place]), Dysrhythmia, GERD (gastroesophageal reflux disease), Hemoptysis (04/22/2016), Hypercholesteremia, Hypertension, Hyperthyroidism, Kidney stones, and Sepsis ([place]) (04/22/2016).   Surgical History:   Past Surgical History:  Procedure Laterality Date   CATARACT EXTRACTION W/PHACO Left 09/26/2016   Procedure: CATARACT EXTRACTION PHACO AND INTRAOCULAR LENS PLACEMENT (IOC);  Surgeon: Eulogio Bear, MD;  Location: [facility] ORS;  Service: Ophthalmology;  Laterality: Left;  [place] 00:52.0 AP% 17.0 CDE 8.86 Fluid pack lot # [id number]   EYE SURGERY Right 2015   cataract   JOINT REPLACEMENT Right    knee   KNEE SURGERY Right 2015   total knee replacement revision   LEFT HEART CATH AND CORONARY ANGIOGRAPHY N/A 06/25/2021   Procedure: LEFT HEART CATH AND CORONARY ANGIOGRAPHY;  Surgeon: Isaias Cowman, MD;  Location: [facility];  Service: Cardiovascular;  Laterality: N/A;      Social History:   reports that he has never smoked. He uses smokeless tobacco. He reports that he does not drink alcohol and does not use drugs.   Family History:  His family history includes Dementia in his mother; Heart disease in his father.   Allergies Allergies  Allergen Reactions   Statins Other (See Comments)    Leg weakness   Sulfa Antibiotics Rash     Home Medications  Prior to Admission medications   Medication Sig Start Date End Date Taking? Authorizing Provider  albuterol (VENTOLIN HFA) 108 (90 Base) MCG/ACT inhaler Inhale into the lungs every 6 (six) hours as needed for wheezing or shortness of breath.   Yes [provider]  amiodarone (PACERONE) 200 MG tablet Take 200 mg by mouth daily.   Yes [provider]  aspirin EC 81 MG EC tablet Take 1 tablet (81 mg total) by mouth daily. Swallow whole. 06/25/21  Yes Wouk, Ailene Rud, MD  budesonide-formoterol [facility]) 160-4.5 MCG/ACT inhaler Inhale 2 puffs into the lungs 2 (two) times daily.   Yes [provider]  doxycycline (VIBRA-TABS) 100 MG tablet Take 100 mg by mouth 2 (two) times daily. 07/18/21 07/25/21 Yes [provider]  escitalopram (LEXAPRO) 20 MG tablet Take 20 mg by mouth daily.   Yes [provider]  ezetimibe (ZETIA) 10 MG tablet Take 10 mg by mouth daily.   Yes [provider]  furosemide (LASIX) 40 MG tablet Take 40 mg by mouth.   Yes [provider]  glipiZIDE (GLUCOTROL XL) 10 MG 24 hr tablet Take 10 mg by mouth daily with breakfast.   Yes [provider]  HYDROcodone-acetaminophen ([place])  10-325 MG tablet Take 1 tablet by mouth every 6 (six) hours as needed.   Yes [provider]  insulin glargine (LANTUS) 100 UNIT/ML injection Inject 20 Units into the skin daily.   Yes [provider]  metoprolol succinate (TOPROL-XL) 50 MG 24 hr tablet Take 150 mg by mouth daily. Take with or immediately following a meal.   Yes  [provider]  Multiple Vitamins-Minerals (MULTIVITAMIN WITH MINERALS) tablet Take 1 tablet by mouth daily.   Yes [provider]  nystatin (MYCOSTATIN/NYSTOP) powder Apply 1 application. topically daily. Apply to groin topically every day and evening for rash   Yes [provider]  omeprazole (PRILOSEC) 20 MG capsule Take 20 mg by mouth 2 (two) times daily before a meal.   Yes [provider]  polyethylene glycol powder (GLYCOLAX/MIRALAX) 17 GM/SCOOP powder Take 17 g by mouth daily.   Yes [provider]  potassium chloride SA (KLOR-CON M) 20 MEQ tablet Take 20 mEq by mouth daily.   Yes [provider]  predniSONE (DELTASONE) 20 MG tablet Take 20 mg by mouth in the morning and at bedtime. 07/18/21 07/25/21 Yes [provider]  pregabalin (LYRICA) 25 MG capsule Take 1 capsule (25 mg total) by mouth 3 (three) times daily. 06/25/21  Yes Wouk, Ailene Rud, MD  sennosides-docusate sodium (SENOKOT-S) 8.6-50 MG tablet Take 2 tablets by mouth in the morning and at bedtime.   Yes [provider]  traZODone (DESYREL) 50 MG tablet Take 150 mg by mouth at bedtime as needed for sleep. 07/09/21 07/24/21 Yes [provider]  warfarin (COUMADIN) 3 MG tablet Take 3 mg by mouth daily. Take 1.5mg  once daily on Tuesdays,Thursdays,Sundays, and Saturdays Take 3mg  once daily on Mondays, Wednesdays, and Fridays   Yes [provider]  Ampicillin-Sulbactam 3 g in sodium chloride 0.9 % 100 mL Inject 3 g into the vein every 6 (six) hours. 06/25/21   Wouk, Ailene Rud, MD  methimazole (TAPAZOLE) 10 MG tablet Take 1 tablet (10 mg total) by mouth 2 (two) times daily. Patient taking differently: Take 10 mg by mouth daily. 06/25/21   Wouk, Ailene Rud, MD  metoprolol succinate (TOPROL-XL) 25 MG 24 hr tablet Take 1 tablet (25 mg total) by mouth daily. Patient not taking: Reported on 07/20/2021 06/25/21   Gwynne Edinger, MD  mometasone-formoterol  [facility]) 200-5 MCG/ACT AERO Inhale 2 puffs into the lungs 2 (two) times daily. 06/25/21   Wouk, Ailene Rud, MD  pantoprazole (PROTONIX) 40 MG injection Inject 40 mg into the vein every 12 (twelve) hours. 06/25/21   Wouk, Ailene Rud, MD    Critical care provider statement:   Total critical care time:  109 minutes   Performed by: Lanney Gins MD   Critical care time was exclusive of separately billable procedures and treating other patients.   Critical care was necessary to treat or prevent imminent or life-threatening deterioration.   Critical care was time spent personally by me on the following activities: development of treatment plan with patient and/or surrogate as well as nursing, discussions with consultants, evaluation of patient's response to treatment, examination of patient, obtaining history from patient or surrogate, ordering and performing treatments and interventions, ordering and review of laboratory studies, ordering and review of radiographic studies, pulse oximetry and re-evaluation of patient's condition.    Ottie Glazier, M.D.  Pulmonary & Critical Care Medicine

## 2021-07-23 NOTE — Progress Notes (Addendum)
[place] for warfarin Indication: atrial fibrillation  Patient Measurements: Height: 6\' 2"  (188 cm) Weight: 94.8 kg (209 lb) IBW/kg (Calculated) : 82.2  Labs: Recent Labs    07/21/21 0547 07/22/21 0316 07/23/21 0344  HGB 8.2* 7.2* 6.8*  HCT 29.1* 25.1* 23.8*  PLT 363 324 283  LABPROT 29.1* 30.5* 28.3*  INR 2.8* 2.9* 2.7*  CREATININE 3.06* 3.98* 3.96*    Estimated Creatinine Clearance: 18.7 mL/min (A) (by C-G formula based on SCr of 3.96 mg/dL (H)).  Medical History: Past Medical History:  Diagnosis Date   A-fib ([place])    Abnormal heart rhythm    Arthritis    Asthma    CAP (community acquired pneumonia) 04/22/2016   Diabetes mellitus without complication ([place])    Dysrhythmia    afib treated with flecainide   GERD (gastroesophageal reflux disease)    Hemoptysis 04/22/2016   Hypercholesteremia    Hypertension    Hyperthyroidism    Kidney stones    Sepsis ([place]) 04/22/2016    Assessment: 75yo with past medical history of HFpEF, paroxysmal atrial fibrillation (on warfarin, metoprolol, and flecainide), bilateral carotid stenosis who presented to the ED with SOB. Pharmacy is asked to dose warfarin while inpatient. INR is currently therapeutic on admission  Last warfarin regimen as follows: 3 mg on MoWeFr and 1.5 mg SuTuThSa  Date INR Plan  5/25 2.6 1 mg  5/26 3 Hold  5/27 2.8 1.5 mg  5/28 2.9 1 mg  5/29 2.7 Hold per NP       DDIs: cefepime, linezolid  (HCAP coverage) -on solu-Medrol 120mg  q12h Albumin 2.3, mild transaminitis *was on amiodarone PTA   Goal of Therapy:  INR 2-3 Monitor platelets by anticoagulation protocol: Yes   Plan:  -INR therapeutic --NP wants to hold Warfarin dose today 5/29, d/t drop in Hgb. -watch Hgb 7.2>> 6.8   Plt 283 --Suspect increased warfarin sensitivity secondary to DDIs, liver dysfunction, and lack of nutrition --Daily INR per protocol --CBC at least every 3 days per  protocol  Jihad Brownlow A 07/23/2021,8:59 AM

## 2021-07-23 NOTE — Consult Note (Cosign Needed Addendum)
Consultation Note Date: 07/23/2021   Patient Name: Alan Stafford  DOB: 1945/03/18  MRN: [id number]  Age / Sex: 76 y.o., male  PCP: Raelene Bott, MD Referring Physician: Ottie Glazier, MD  Reason for Consultation: Establishing goals of care  HPI/Patient Profile: [name] is a 76 y.o.  male with medical problems of coronary artery disease with history of non-STEMI, CABG x3 (May 2023 @ [facility]), atrial fibrillation/flutter status post ablation, bilateral carotid artery stenosis, hypertension, hyperlipidemia, type 2 diabetes, hypothyroidism, CKD, COPD, history of stroke, chronic pain  was admitted on 07/09/2021 for :SOB (shortness of breath)  Clinical Assessment and Goals of Care: Notes and labs reviewed. CCM tells me he has just spoken with family to update and initiate [place] discussion. He states they are considering no chest compressions if heart stops.  In to speak with patient's family. Patient is on ventilator at this time. Wife and patient's 3 daughters were present. They discuss his CABG, and admission to SNF and then decline. They state at baseline, prior to his MI, he was fully independent and required no assistance or assistive devices.    We discussed his diagnoses, prognosis, and GOC.  Created space and opportunity for patient  to explore thoughts and feelings regarding current medical information.   A detailed discussion was had today regarding advanced directives.  Concepts specific to code status, ventilator support, dialysis, and QOL was discussed.  The difference between an aggressive medical intervention path and a comfort care path was discussed.  Values and goals of care important to patient and family were attempted to be elicited.  Discussed limitations of medical interventions to prolong quality of life in some situations and discussed the concept of human mortality. Family has strong  faith.   They have been updated by CCM and are able to articulate his status. Youngest daughter asks specific and intricate questions regarding "scholarly articles and research" she has read, and what could be tried based on this information.   Discussed that CCM would be able to answer these questions.   We discuss the value of determining his wishes. Family discusses that QOL is very important. We discuss "what if's" and acceptable QOL following D/C. They advise he would not be happy if he had to live in a LTC facility. He likely would not want to return to a SNF. Wife tells me he would not be happy with the prospect of dialysis but may do it temporarily; they state he would not want long term dialysis. They tell me as he is now, resting on the ventilator, is no QOL and is the vehicle to provide time for outcomes.   They discuss they have spoken with CCM regarding chest compressions and  indeed would not want chest compressions as patient has just had a CABG earlier this month.   Family is hopeful patient will be able to awaken in the next few days and have his own [place] discussion. They are unsure of the length of time he would want ventilator support  but will continue to discuss this as a family.      ADDENDUM: Requested to return to bedside to speak with family. Spoke with family, questions answered. RN at bedside and answered questions about overnight care.  Per staff patient was on 10/10 all night and vent settings were changed early this morning. Discussed allowing him to rest tonight. Discussed meeting at 12:00 tomorrow for 1 way extubation with support if he does well, and comfort care if he does not.         SUMMARY OF RECOMMENDATIONS   Family confirms they would not want chest compressions in light of the fact he just had a CABG earlier this month.   He would not want long term dialysis.   He would not want to live in a LTC facility and may not want to return to a SNF.   Time for  outcomes.   Prognosis:  Poor        Primary Diagnoses: Present on Admission:  SOB (shortness of breath)  Sepsis (HCC)  Paroxysmal atrial fibrillation (HCC)  CAD in native artery  Type 2 diabetes mellitus with vascular disease ([place])  Hyperthyroidism  Acute on chronic systolic congestive heart failure (HCC)  Acute respiratory failure with hypoxia (HCC)  Pneumonia  Hypoxia   I have reviewed the medical record, interviewed the patient and family, and examined the patient. The following aspects are pertinent.  Past Medical History:  Diagnosis Date   A-fib ([place])    Abnormal heart rhythm    Arthritis    Asthma    CAP (community acquired pneumonia) 04/22/2016   Diabetes mellitus without complication ([place])    Dysrhythmia    afib treated with flecainide   GERD (gastroesophageal reflux disease)    Hemoptysis 04/22/2016   Hypercholesteremia    Hypertension    Hyperthyroidism    Kidney stones    Sepsis ([place]) 04/22/2016   Social History   Socioeconomic History   Marital status: Married    Spouse name: Not on file   Number of children: Not on file   Years of education: Not on file   Highest education level: Not on file  Occupational History   Not on file  Tobacco Use   Smoking status: Never   Smokeless tobacco: Current   Tobacco comments:    uses dip a little  Vaping Use   Vaping Use: Never used  Substance and Sexual Activity   Alcohol use: No    Alcohol/week: 0.0 standard drinks   Drug use: No   Sexual activity: Not on file  Other Topics Concern   Not on file  Social History Narrative   Not on file   Social Determinants of Health   Financial Resource Strain: Not on file  Food Insecurity: Not on file  Transportation Needs: Not on file  Physical Activity: Not on file  Stress: Not on file  Social Connections: Not on file   Family History  Problem Relation Age of Onset   Dementia Mother    Heart disease Father    Scheduled Meds:  aspirin  81 mg Per Tube  Daily   chlorhexidine gluconate (MEDLINE KIT)  15 mL Mouth Rinse BID   Chlorhexidine Gluconate Cloth  6 each Topical Daily   Chlorhexidine Gluconate Cloth  6 each Topical Q0600   docusate  100 mg Per Tube BID   escitalopram  20 mg Per Tube Daily   ezetimibe  10 mg Per Tube Daily   insulin aspart  0-20  Units Subcutaneous Q4H   mouth rinse  15 mL Mouth Rinse 10 times per day   methimazole  10 mg Per Tube Daily   methylPREDNISolone (SOLU-MEDROL) injection  80 mg Intravenous Q12H   metoprolol tartrate  12.5 mg Per Tube BID   mupirocin ointment  1 application. Nasal BID   pantoprazole sodium  40 mg Per Tube Daily   polyethylene glycol  17 g Per Tube Daily   pregabalin  25 mg Per Tube TID   sodium chloride flush  10-40 mL Intracatheter Q12H   warfarin  1 mg Per Tube ONCE-1600   Warfarin - Pharmacist Dosing Inpatient   Does not apply q1600   Continuous Infusions:  albumin human     ceFEPime (MAXIPIME) IV Stopped (07/23/21 1030)   linezolid (ZYVOX) IV Stopped (07/23/21 1158)   propofol (DIPRIVAN) infusion 50 mcg/kg/min (07/23/21 1300)   PRN Meds:.acetaminophen **OR** acetaminophen, albuterol, hydrALAZINE, HYDROcodone-acetaminophen, HYDROmorphone (DILAUDID) injection, midazolam, prochlorperazine, sodium chloride flush Medications Prior to Admission:  Prior to Admission medications   Medication Sig Start Date End Date Taking? Authorizing Provider  albuterol (VENTOLIN HFA) 108 (90 Base) MCG/ACT inhaler Inhale into the lungs every 6 (six) hours as needed for wheezing or shortness of breath.   Yes [provider]  amiodarone (PACERONE) 200 MG tablet Take 200 mg by mouth daily.   Yes [provider]  aspirin EC 81 MG EC tablet Take 1 tablet (81 mg total) by mouth daily. Swallow whole. 06/25/21  Yes Wouk, Ailene Rud, MD  budesonide-formoterol [facility]) 160-4.5 MCG/ACT inhaler Inhale 2 puffs into the lungs 2 (two) times daily.   Yes [provider]  doxycycline  (VIBRA-TABS) 100 MG tablet Take 100 mg by mouth 2 (two) times daily. 07/18/21 07/25/21 Yes [provider]  escitalopram (LEXAPRO) 20 MG tablet Take 20 mg by mouth daily.   Yes [provider]  ezetimibe (ZETIA) 10 MG tablet Take 10 mg by mouth daily.   Yes [provider]  furosemide (LASIX) 40 MG tablet Take 40 mg by mouth.   Yes [provider]  glipiZIDE (GLUCOTROL XL) 10 MG 24 hr tablet Take 10 mg by mouth daily with breakfast.   Yes [provider]  HYDROcodone-acetaminophen (NORCO) 10-325 MG tablet Take 1 tablet by mouth every 6 (six) hours as needed.   Yes [provider]  insulin glargine (LANTUS) 100 UNIT/ML injection Inject 20 Units into the skin daily.   Yes [provider]  metoprolol succinate (TOPROL-XL) 50 MG 24 hr tablet Take 150 mg by mouth daily. Take with or immediately following a meal.   Yes [provider]  Multiple Vitamins-Minerals (MULTIVITAMIN WITH MINERALS) tablet Take 1 tablet by mouth daily.   Yes [provider]  nystatin (MYCOSTATIN/NYSTOP) powder Apply 1 application. topically daily. Apply to groin topically every day and evening for rash   Yes [provider]  omeprazole (PRILOSEC) 20 MG capsule Take 20 mg by mouth 2 (two) times daily before a meal.   Yes [provider]  polyethylene glycol powder (GLYCOLAX/MIRALAX) 17 GM/SCOOP powder Take 17 g by mouth daily.   Yes [provider]  potassium chloride SA (KLOR-CON M) 20 MEQ tablet Take 20 mEq by mouth daily.   Yes [provider]  predniSONE (DELTASONE) 20 MG tablet Take 20 mg by mouth in the morning and at bedtime. 07/18/21 07/25/21 Yes [provider]  pregabalin (LYRICA) 25 MG capsule Take 1 capsule (25 mg total) by mouth 3 (three) times  daily. 06/25/21  Yes Wouk, Ailene Rud, MD  sennosides-docusate sodium (SENOKOT-S) 8.6-50 MG tablet Take 2 tablets by mouth in the morning and at bedtime.   Yes  [provider]  traZODone (DESYREL) 50 MG tablet Take 150 mg by mouth at bedtime as needed for sleep. 07/09/21 07/24/21 Yes [provider]  warfarin (COUMADIN) 3 MG tablet Take 3 mg by mouth daily. Take 1.4m once daily on Tuesdays,Thursdays,Sundays, and Saturdays Take 370monce daily on Mondays, Wednesdays, and Fridays   Yes [provider]  Ampicillin-Sulbactam 3 g in sodium chloride 0.9 % 100 mL Inject 3 g into the vein every 6 (six) hours. 06/25/21   Wouk, NoAilene RudMD  methimazole (TAPAZOLE) 10 MG tablet Take 1 tablet (10 mg total) by mouth 2 (two) times daily. Patient taking differently: Take 10 mg by mouth daily. 06/25/21   Wouk, NoAilene RudMD  metoprolol succinate (TOPROL-XL) 25 MG 24 hr tablet Take 1 tablet (25 mg total) by mouth daily. Patient not taking: Reported on 07/10/2021 06/25/21   WoGwynne EdingerMD  mometasone-formoterol ([facility]200-5 MCG/ACT AERO Inhale 2 puffs into the lungs 2 (two) times daily. 06/25/21   Wouk, NoAilene RudMD  pantoprazole (PROTONIX) 40 MG injection Inject 40 mg into the vein every 12 (twelve) hours. 06/25/21   Wouk, NoAilene RudMD   Allergies  Allergen Reactions   Statins Other (See Comments)    Leg weakness   Sulfa Antibiotics Rash   Review of Systems  Unable to perform ROS  Physical Exam Constitutional:      Comments: Eyes closed.  Pulmonary:     Comments: On ventilator.    Vital Signs: BP 107/74   Pulse 92   Temp 98.1 F (36.7 C)   Resp (!) 21   Ht 6' 2"  (1.88 m)   Wt 94.8 kg   SpO2 97%   BMI 26.83 kg/m  Pain Scale: CPOT   Pain Score: 0-No pain   SpO2: SpO2: 97 % O2 Device:SpO2: 97 % O2 Flow Rate: .O2 Flow Rate (L/min): 5 L/min  IO: Intake/output summary:  Intake/Output Summary (Last 24 hours) at 07/23/2021 1350 Last data filed at 07/23/2021 1300 Gross per 24 hour  Intake 1949.93 ml  Output 1400 ml  Net 549.93 ml    LBM: Last BM Date : 07/20/21 Baseline Weight: Weight: 94.8 kg Most recent  weight: Weight: 94.8 kg      Signed by: CrAsencion GowdaNP   Please contact Palliative Medicine Team phone at [phone] questions and concerns.  For individual provider: See AmShea Evans

## 2021-07-23 NOTE — Progress Notes (Signed)
   07/23/21 1600  Clinical Encounter Type  Visited With Patient and family together  Visit Type Initial  Spiritual Encounters  Spiritual Needs Prayer;Grief support   Chaplain provided support to family of patient in critical condition.

## 2021-07-23 NOTE — Consult Note (Signed)
PHARMACY CONSULT NOTE  Pharmacy Consult for Electrolyte Monitoring and Replacement   Recent Labs: Potassium (mmol/L)  Date Value  07/23/2021 4.8   Magnesium (mg/dL)  Date Value  17/79/3903 2.7 (H)   Calcium (mg/dL)  Date Value  00/92/3300 8.1 (L)   Albumin (g/dL)  Date Value  76/22/6333 2.3 (L)   Phosphorus (mg/dL)  Date Value  54/56/2563 7.9 (H)   Sodium (mmol/L)  Date Value  07/23/2021 137   Assessment: Patient is a 76 y/o M with medical history including asthma, DM, HTN, hypothyroidism, Afib, CAD s/p CABG on 07/14/21, CKD, CVA, NSTEMI, HFpEF who is admitted with respiratory failure in setting of pneumonia ultimately requiring intubation. Patient is currently admitted to the ICU where he is intubated, sedated, and on mechanical ventilation.   Goal of Therapy:  Electrolytes within normal limits  Plan:  --Renal function (SCR 1.71>2.06-->3.98-> 3.96) --K 5.2->4.8,  lokelma x 2 ordered by MD on evening of 5/28 --nephrology note 5/28: If BUN, creatinine, potassium are still worse tomorrow 5/29, consider CRRT --Follow-up electrolytes with AM labs tomorrow  Sharne Linders A 07/23/2021 9:10 AM

## 2021-07-24 DIAGNOSIS — R0602 Shortness of breath: Secondary | ICD-10-CM | POA: Diagnosis not present

## 2021-07-24 DIAGNOSIS — Z7189 Other specified counseling: Secondary | ICD-10-CM | POA: Diagnosis not present

## 2021-07-24 LAB — CBC WITH DIFFERENTIAL/PLATELET
Abs Immature Granulocytes: 0.07 10*3/uL (ref 0.00–0.07)
Basophils Absolute: 0 10*3/uL (ref 0.0–0.1)
Basophils Relative: 0 %
Eosinophils Absolute: 0 10*3/uL (ref 0.0–0.5)
Eosinophils Relative: 0 %
HCT: 26.3 % — ABNORMAL LOW (ref 39.0–52.0)
Hemoglobin: 8.2 g/dL — ABNORMAL LOW (ref 13.0–17.0)
Immature Granulocytes: 1 %
Lymphocytes Relative: 9 %
Lymphs Abs: 0.6 10*3/uL — ABNORMAL LOW (ref 0.7–4.0)
MCH: 26.8 pg (ref 26.0–34.0)
MCHC: 31.2 g/dL (ref 30.0–36.0)
MCV: 85.9 fL (ref 80.0–100.0)
Monocytes Absolute: 0.5 10*3/uL (ref 0.1–1.0)
Monocytes Relative: 8 %
Neutro Abs: 5.5 10*3/uL (ref 1.7–7.7)
Neutrophils Relative %: 82 %
Platelets: 272 10*3/uL (ref 150–400)
RBC: 3.06 MIL/uL — ABNORMAL LOW (ref 4.22–5.81)
RDW: 17.2 % — ABNORMAL HIGH (ref 11.5–15.5)
WBC: 6.7 10*3/uL (ref 4.0–10.5)
nRBC: 0.4 % — ABNORMAL HIGH (ref 0.0–0.2)

## 2021-07-24 LAB — BASIC METABOLIC PANEL
Anion gap: 11 (ref 5–15)
BUN: 85 mg/dL — ABNORMAL HIGH (ref 8–23)
CO2: 24 mmol/L (ref 22–32)
Calcium: 8.2 mg/dL — ABNORMAL LOW (ref 8.9–10.3)
Chloride: 105 mmol/L (ref 98–111)
Creatinine, Ser: 2.78 mg/dL — ABNORMAL HIGH (ref 0.61–1.24)
GFR, Estimated: 23 mL/min — ABNORMAL LOW (ref >60)
Glucose, Bld: 188 mg/dL — ABNORMAL HIGH (ref 70–99)
Potassium: 4 mmol/L (ref 3.5–5.1)
Sodium: 140 mmol/L (ref 135–145)

## 2021-07-24 LAB — TYPE AND SCREEN
ABO/RH(D): A POS
Antibody Screen: NEGATIVE
Unit division: 0

## 2021-07-24 LAB — CULTURE, BLOOD (ROUTINE X 2)
Culture: NO GROWTH
Culture: NO GROWTH
Special Requests: ADEQUATE

## 2021-07-24 LAB — BPAM RBC
Blood Product Expiration Date: 202305302359
ISSUE DATE / TIME: 202305290813
Unit Type and Rh: 9500

## 2021-07-24 LAB — GLUCOSE, CAPILLARY
Glucose-Capillary: 126 mg/dL — ABNORMAL HIGH (ref 70–99)
Glucose-Capillary: 141 mg/dL — ABNORMAL HIGH (ref 70–99)
Glucose-Capillary: 142 mg/dL — ABNORMAL HIGH (ref 70–99)
Glucose-Capillary: 151 mg/dL — ABNORMAL HIGH (ref 70–99)
Glucose-Capillary: 171 mg/dL — ABNORMAL HIGH (ref 70–99)
Glucose-Capillary: 203 mg/dL — ABNORMAL HIGH (ref 70–99)

## 2021-07-24 LAB — CYCLIC CITRUL PEPTIDE ANTIBODY, IGG/IGA: CCP Antibodies IgG/IgA: 6 units (ref 0–19)

## 2021-07-24 LAB — TRIGLYCERIDES: Triglycerides: 697 mg/dL — ABNORMAL HIGH (ref <150)

## 2021-07-24 LAB — MAGNESIUM: Magnesium: 2.8 mg/dL — ABNORMAL HIGH (ref 1.7–2.4)

## 2021-07-24 LAB — PROTIME-INR
INR: 2.2 — ABNORMAL HIGH (ref 0.8–1.2)
Prothrombin Time: 24.2 seconds — ABNORMAL HIGH (ref 11.4–15.2)

## 2021-07-24 LAB — PHOSPHORUS: Phosphorus: 5.1 mg/dL — ABNORMAL HIGH (ref 2.5–4.6)

## 2021-07-24 MED ORDER — DEXMEDETOMIDINE HCL IN NACL 400 MCG/100ML IV SOLN
0.4000 ug/kg/h | INTRAVENOUS | Status: DC
  Administered 2021-07-24: 0.4 ug/kg/h via INTRAVENOUS
  Administered 2021-07-24: 1.2 ug/kg/h via INTRAVENOUS
  Administered 2021-07-25: 0.4 ug/kg/h via INTRAVENOUS
  Administered 2021-07-25: 1.2 ug/kg/h via INTRAVENOUS
  Administered 2021-07-25: 0.8 ug/kg/h via INTRAVENOUS
  Administered 2021-07-25: 1.2 ug/kg/h via INTRAVENOUS
  Filled 2021-07-24 (×5): qty 100

## 2021-07-24 MED ORDER — SODIUM CHLORIDE 0.9 % IV SOLN
500.0000 mg | INTRAVENOUS | Status: DC
  Administered 2021-07-24 – 2021-07-25 (×2): 500 mg via INTRAVENOUS
  Filled 2021-07-24 (×2): qty 500

## 2021-07-24 MED ORDER — ACETAMINOPHEN 10 MG/ML IV SOLN
1000.0000 mg | Freq: Four times a day (QID) | INTRAVENOUS | Status: AC
  Administered 2021-07-24 – 2021-07-25 (×4): 1000 mg via INTRAVENOUS
  Filled 2021-07-24 (×4): qty 100

## 2021-07-24 MED ORDER — CHLORHEXIDINE GLUCONATE 0.12 % MT SOLN
15.0000 mL | Freq: Two times a day (BID) | OROMUCOSAL | Status: DC
  Administered 2021-07-24 – 2021-07-26 (×4): 15 mL via OROMUCOSAL
  Filled 2021-07-24: qty 15

## 2021-07-24 MED ORDER — CHLORHEXIDINE GLUCONATE 0.12 % MT SOLN
OROMUCOSAL | Status: AC
  Filled 2021-07-24: qty 15

## 2021-07-24 MED ORDER — ORAL CARE MOUTH RINSE
15.0000 mL | Freq: Two times a day (BID) | OROMUCOSAL | Status: DC
  Administered 2021-07-24 – 2021-07-26 (×4): 15 mL via OROMUCOSAL

## 2021-07-24 MED ORDER — SODIUM CHLORIDE 0.9 % IV SOLN
1.0000 g | INTRAVENOUS | Status: DC
  Administered 2021-07-25: 1 g via INTRAVENOUS
  Filled 2021-07-24: qty 1

## 2021-07-24 MED ORDER — WARFARIN SODIUM 2.5 MG PO TABS
2.5000 mg | ORAL_TABLET | Freq: Once | ORAL | Status: DC
Start: 2021-07-24 — End: 2021-07-25
  Filled 2021-07-24: qty 1

## 2021-07-24 MED ORDER — METHYLPREDNISOLONE SODIUM SUCC 125 MG IJ SOLR
60.0000 mg | Freq: Two times a day (BID) | INTRAMUSCULAR | Status: DC
  Administered 2021-07-24 – 2021-07-25 (×2): 60 mg via INTRAVENOUS
  Filled 2021-07-24 (×2): qty 2

## 2021-07-24 MED ORDER — METOPROLOL TARTRATE 5 MG/5ML IV SOLN
5.0000 mg | INTRAVENOUS | Status: DC | PRN
  Administered 2021-07-24 – 2021-07-25 (×2): 5 mg via INTRAVENOUS
  Filled 2021-07-24 (×3): qty 5

## 2021-07-24 NOTE — Progress Notes (Addendum)
Daily Progress Note   Patient Name: Alan Stafford       Date: 07/24/2021 DOB: September 24, 1945  Age: 76 y.o. MRN#: [id number] Attending Physician: Ottie Glazier, MD Primary Care Physician: Raelene Bott, MD Admit Date: 07/08/2021  Reason for Consultation/Follow-up: Establishing goals of care  Subjective: Patient is resting in bed on ventilator. Wife and daughters are at bedside. Long discussion of hypothetical questions and "what ifs". Discussion regarding acceptable QOL. Ultimately they want to extubate and are hopeful he can wake up to make decisions himself. He was able to move his arm on command and indicated he wanted the tube removed.   Patient was extubated but was not responsive to staff or family. Patient placed on BIPAP. Remained on unit and patient was unable to make decisions himself. CCM managing as family wants to continue ICU care.   Length of Stay: 5  Current Medications: Scheduled Meds:   aspirin  81 mg Per Tube Daily   chlorhexidine gluconate (MEDLINE KIT)  15 mL Mouth Rinse BID   Chlorhexidine Gluconate Cloth  6 each Topical Daily   Chlorhexidine Gluconate Cloth  6 each Topical Q0600   docusate  100 mg Per Tube BID   escitalopram  20 mg Per Tube Daily   ezetimibe  10 mg Per Tube Daily   insulin aspart  0-20 Units Subcutaneous Q4H   mouth rinse  15 mL Mouth Rinse 10 times per day   methimazole  10 mg Per Tube Daily   methylPREDNISolone (SOLU-MEDROL) injection  60 mg Intravenous Q12H   metoprolol tartrate  12.5 mg Per Tube BID   mupirocin ointment  1 application. Nasal BID   pantoprazole sodium  40 mg Per Tube Daily   polyethylene glycol  17 g Per Tube Daily   pregabalin  25 mg Per Tube TID   sodium chloride flush  10-40 mL Intracatheter Q12H   Warfarin - Pharmacist  Dosing Inpatient   Does not apply q1600    Continuous Infusions:  albumin human     azithromycin 250 mL/hr at 07/24/21 1237   [START ON 07/25/2021] cefTRIAXone (ROCEPHIN)  IV     dexmedetomidine (PRECEDEX) IV infusion      PRN Meds: acetaminophen **OR** acetaminophen, albuterol, fentaNYL (SUBLIMAZE) injection, fentaNYL (SUBLIMAZE) injection, hydrALAZINE, HYDROcodone-acetaminophen, HYDROmorphone (DILAUDID) injection, midazolam, sodium chloride flush  Physical  Exam Pulmonary:     Comments: On vent then BIPAP.  Neurological:     Mental Status: He is alert.            Vital Signs: BP (!) 149/83   Pulse (!) 101   Temp 97.9 F (36.6 C) (Esophageal)   Resp 20   Ht 6' 2.02" (1.88 m)   Wt 94.8 kg   SpO2 99%   BMI 26.82 kg/m  SpO2: SpO2: 99 % O2 Device: O2 Device: Ventilator O2 Flow Rate: O2 Flow Rate (L/min): 5 L/min  Intake/output summary:  Intake/Output Summary (Last 24 hours) at 07/24/2021 1313 Last data filed at 07/24/2021 1237 Gross per 24 hour  Intake 2820.5 ml  Output 1150 ml  Net 1670.5 ml   LBM: Last BM Date : 07/24/21 Baseline Weight: Weight: 94.8 kg Most recent weight: Weight: 94.8 kg   Patient Active Problem List   Diagnosis Date Noted   Pressure injury of skin 07/20/2021   Hypoxia 07/20/2021   SOB (shortness of breath) 07/17/2021   Acute respiratory failure with hypoxia (HCC) 07/04/2021   CHF (congestive heart failure), NYHA class II, acute, diastolic ([place]) 54/10/8117   History of CVA (cerebrovascular accident) 06/22/2021   Hyperkalemia 06/21/2021   CKD stage 3 due to type 2 diabetes mellitus ([place]) 06/21/2021   Sepsis ([place]) 06/21/2021   NSTEMI (non-ST elevated myocardial infarction) ([place]) 14/78/2956   Acute metabolic encephalopathy 21/30/8657   Fall at home, initial encounter (06/02/2021) 06/05/2021   Osteoarthritis of hip (Right) 12/02/2019   Shoulder pain, left 11/03/2019   Occipital stroke ([place]) 09/15/2019   Bilateral hand pain 06/11/2019   Chronic  pain of right knee 03/29/2019   Osteoarthritis of hip (Left) 12/02/2018   Chronic sacroiliac joint pain (Right) 10/29/2018   Abnormal MRI, lumbar spine (03/24/2019) 09/17/2018   Compression fracture of L2 lumbar vertebra, sequela 09/17/2018   Lumbosacral spinal stenosis (Multilevel) 09/17/2018   Chronic anticoagulation (Coumadin) 09/01/2018   Bilateral carotid artery stenosis 05/05/2018   Chronic low back pain (2ry area of Pain) (Bilateral) (R>L) w/ sciatica (Bilateral) 08/13/2017   Chronic pain syndrome 08/05/2016   Diabetic peripheral neuropathy (HCC) 84/69/6295   Chronic systolic heart failure ([place]) 06/06/2016   Pneumonia 05/18/2016   Acute on chronic systolic congestive heart failure ([place]) 04/23/2016   Chronic combined systolic and diastolic congestive heart failure ([place]) 04/23/2016   CAP (community acquired pneumonia) 04/22/2016   Controlled substance agreement signed 10/05/2015   Benign non-nodular prostatic hyperplasia with lower urinary tract symptoms 10/05/2015   Cervical facet syndrome (Bilateral) (R>L) 08/17/2015   Osteoarthritis of knee (Left) 08/17/2015   Encounter for therapeutic drug level monitoring 01/17/2015   Encounter for chronic pain management 01/17/2015   Opioid dependence ([place]) 01/17/2015   Lumbar spondylosis 01/03/2015   CAD in native artery 01/03/2015   Acid reflux 01/03/2015   Cardiac murmur 01/03/2015   Hypertriglyceridemia 01/03/2015   Angina pectoris ([place]) 01/03/2015   Long term current use of anticoagulant therapy (Coumadin) 01/03/2015   Bulging lumbar disc (T12-L1, L1-2, L2-3, and L4-5) 01/03/2015   Lumbar foraminal stenosis (Multilevel) (Severe) (Right: L4-5; Left: L3-4) 01/03/2015   Chronic lower extremity pain (1ry area of Pain) (Bilateral) (R>L) 01/03/2015   Chronic hip pain (Right) 01/03/2015   Chronic lumbar radicular pain (Right) 01/03/2015   Myofascial pain syndrome (right suprascapular muscle) 01/03/2015   Chronic neck pain (Bilateral)  (R>L) 01/03/2015   Chronic shoulder pain (Right) 01/03/2015   Cervical spondylosis 01/03/2015   Chronic cervical radicular pain (Right) 01/03/2015  Chronic knee pain (3ry area of Pain) (Left) 01/03/2015   Long term current use of opiate analgesic 01/03/2015   Long term prescription opiate use 01/03/2015   Opiate use 01/03/2015   Compression fracture of thoracic vertebra (HCC) (T3, T4, and T8) 01/03/2015   Cervical facet hypertrophy (Bilateral C3-4) 01/03/2015   Cervical nerve root disorder 01/03/2015   Closed wedge fracture of thoracic vertebra, sequela (T3, T4, and T8) 01/03/2015   Atrial fibrillation with RVR ([place]) 04/21/2014   Hypotestosteronism 10/05/2013   Low testosterone 10/05/2013   Encounter for screening for other disorder 07/05/2013   Drug intolerance 07/05/2013   Stage 3b chronic kidney disease ([place]) 12/31/2012   Airway hyperreactivity 09/30/2012   Hyperthyroidism 09/30/2012   Asthma 09/30/2012   Essential hypertension 09/25/2012   Hypercholesterolemia 09/25/2012   Paroxysmal atrial fibrillation ([place]) 09/25/2012   Type 2 diabetes mellitus with vascular disease ([place]) 09/25/2012   DDD (degenerative disc disease), lumbosacral 09/25/2012    Palliative Care Assessment & Plan    Recommendations/Plan:  Extubated. CCM managing as family is continuing ICU level care.   Code Status:    Code Status Orders  (From admission, onward)           Start     Ordered   07/23/21 1407  Do not attempt resuscitation (DNR)  Continuous       Question Answer Comment  In the event of cardiac or respiratory ARREST Do not call a "code blue"   In the event of cardiac or respiratory ARREST Do not perform Intubation, CPR, defibrillation or ACLS   In the event of cardiac or respiratory ARREST Use medication by any route, position, wound care, and other measures to relive pain and suffering. May use oxygen, suction and manual treatment of airway obstruction as needed for comfort.    Comments per family and palliative care team      07/23/21 1406           Code Status History     Date Active Date Inactive Code Status Order ID Comments User Context   07/09/2021 1957 07/23/2021 1406 Full Code [id number]  Para Skeans, MD ED   06/21/2021 1231 06/26/2021 0156 Full Code [id number]  Collier Bullock, MD ED   05/18/2016 2150 05/23/2016 1514 Full Code [id number]  Baxter Hire, MD Inpatient   04/23/2016 0247 04/25/2016 1454 Full Code [id number]  Lance Coon, MD Inpatient      Care plan was discussed with CCM, RN  Thank you for allowing the Palliative Medicine Team to assist in the care of this patient.  Asencion Gowda, NP  Please contact Palliative Medicine Team phone at [phone] for questions and concerns.

## 2021-07-24 NOTE — Progress Notes (Signed)
At shift change; Chaplain met family, emotional support, ans encouragement. 

## 2021-07-24 NOTE — Progress Notes (Signed)
[place] for warfarin Indication: atrial fibrillation  Patient Measurements: Height: 6' 2.02" (188 cm) Weight: 94.8 kg (209 lb) IBW/kg (Calculated) : 82.24  Labs: Recent Labs    07/22/21 0316 07/23/21 0344 07/23/21 1133 07/23/21 1302 07/23/21 1756 07/24/21 0539  HGB 7.2* 6.8*   < > 7.9* 8.2* 8.2*  HCT 25.1* 23.8*   < > 25.4* 26.2* 26.3*  PLT 324 283  --   --   --  272  LABPROT 30.5* 28.3*  --   --   --  24.2*  INR 2.9* 2.7*  --   --   --  2.2*  CREATININE 3.98* 3.96*  --   --   --  2.78*   < > = values in this interval not displayed.    Estimated Creatinine Clearance: 26.7 mL/min (A) (by C-G formula based on SCr of 2.78 mg/dL (H)).  Medical History: Past Medical History:  Diagnosis Date   A-fib ([place])    Abnormal heart rhythm    Arthritis    Asthma    CAP (community acquired pneumonia) 04/22/2016   Diabetes mellitus without complication ([place])    Dysrhythmia    afib treated with flecainide   GERD (gastroesophageal reflux disease)    Hemoptysis 04/22/2016   Hypercholesteremia    Hypertension    Hyperthyroidism    Kidney stones    Sepsis ([place]) 04/22/2016    Assessment: 75yo with past medical history of HFpEF, paroxysmal atrial fibrillation (on warfarin, metoprolol, and flecainide), bilateral carotid stenosis who presented to the ED with SOB. Pharmacy is asked to dose warfarin while inpatient. INR is currently therapeutic on admission  Last warfarin regimen as follows: 3 mg on MoWeFr and 1.5 mg SuTuThSa  Date INR Plan  5/25 2.6 1 mg  5/26 3 Hold  5/27 2.8 1.5 mg  5/28 2.9 1 mg  5/29 2.7 Hold per NP  5/30 2.2 2.5 mg   DDIs: ceftriaxone, azithromycin  (HCAP coverage) -on solu-Medrol 60mg  q12h Albumin 2.3, mild transaminitis *was on amiodarone PTA   Goal of Therapy:  INR 2-3 Monitor platelets by anticoagulation protocol: Yes   Plan:  -INR therapeutic --warfarin 2.5 mg po x 1 today --Suspect increased warfarin  sensitivity secondary to DDIs, liver dysfunction, and lack of nutrition --Daily INR per protocol --CBC at least every 3 days per protocol  Dallie Piles 07/24/2021,7:11 AM

## 2021-07-24 NOTE — Progress Notes (Signed)
NAME:  Alan Stafford, MRN:  [id number], DOB:  1945-12-12, LOS: 5 ADMISSION DATE:  07/10/2021, CONSULTATION DATE:  07/20/21 REFERRING MD:  Dr. Nevada Crane, CHIEF COMPLAINT:  Shortness of breath   History of Present Illness:  76 year old male presenting to [facility] ED from rehab via EMS due to worsening shortness of breath, productive cough, acute oxygen requirement of 3.5 L nasal cannula that has been steadily increasing since 07/16/2021 and fatigue in the setting of recent pneumonia diagnosis.  Patient was prescribed antibiotics and prednisone in rehab for presumed pneumonia. Of note patient was recently discharged to rehab from the [facility] post CABG 06/27/2021.  Per daughter bedside she thinks her mom noted a low-grade fever when he was at the facility but no other complaints that she is aware of.  ED course: Vital signs stable on arrival, however patient remains on acute oxygen.  Imaging revealed worsening pulmonary opacities particularly in the right lung.  Lab work revealed elevated BNP with demand ischemia as troponin elevated but flat in the 40s.  Leukocytosis, however lactic WNL. Due to concern for worsening pneumonia while on antibiotics, TRH consulted for admission to inpatient. medications given: Cefepime, Lasix 20 mg, vancomycin Initial Vitals: 99 F, mildly tachypneic at 22, NSR 74, BP 119/67 and SPO2 96% on 4 L nasal cannula Significant labs: (Labs/ Imaging personally reviewed) I, Domingo Pulse [place], AGACNP-BC, personally viewed and interpreted this ECG. EKG Interpretation: Date: 06/30/2021, EKG Time: 17:09, Rate: 78, Rhythm: NSR, QRS Axis: LAD, Intervals: LAFB, ST/T Wave abnormalities: None, Narrative Interpretation: NSR with LAD & LAFB Chemistry: Na+: 134, K+: 4.3, BUN/Cr.:  27/1.71, Serum CO2/ AG: 22/9, AST: 84 Hematology: WBC: 12.3, Hgb: 8.5,  Troponin: 44> 46, BNP: 752.9, Lactic: 1.2 > 0.9, COVID-19 & Influenza A/B: Negative ABG: 7.42/38/57/24.6 CXR 07/24/2021: Increasing pulmonary opacity  throughout the right lung and in the left lower lobe that could represent developing pneumonia or asymmetric edema  Prior to moving inpatient patient's respiratory status declined, he had increased work of breathing somnolent but arousable to voice as and was placed on BiPAP support.  ABG reassuring, therefore transferred to stepdown unit on BiPAP.  TRH urgently consulted cardiology for assistance in management. PCCM consulted for assistance in management and monitoring due to worsening respiratory status with high risk for intubation. Upon arrival to ICU patient tachypneic, diaphoretic with increased work of breathing and A-fib RVR with heart rate sustaining in the 150s.  Patient also responsive but lethargic.  Daughter brought bedside explaining decision to emergently intubate and place patient on mechanical ventilatory support.   07/23/21- had lengthy conversation with family today. Patient with ILD, AF, CHF, chronic respiratory difficulty with lifelong asthma, DM, Renal failure.   07/24/21- patient seems to be interval unchanged overnight. He is rested on PRVC overnight. He has R PICC TLC. Palliative is following. Patient is extubated but remains restless on BIPAP and required metoprolol for tachycardia and narcotic IV after endorsing pain with head nodding.   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  Upon bedside assessment patient acutely lethargic with increased work of breathing tachypnea and A-fib RVR-decision made to emergently intubate and place the patient on mechanical ventilatory support.   Objective  Blood  pressure 120/76, pulse 92, temperature (!) 97 F (36.1 C), resp. rate 18, height 6' 2.02" (1.88 m), weight 94.8 kg, SpO2 97 %.    Vent Mode: PRVC FiO2 (%):  [30 %] 30 % Set Rate:  [20 bmp] 20 bmp Vt Set:  [500 mL] 500 mL PEEP:  [5 cmH20-8 cmH20] 5 cmH20 Plateau Pressure:  [18 cmH20] 18 cmH20   Intake/Output Summary (Last 24 hours) at 07/24/2021 1030 Last data filed at 07/24/2021 [id number] Gross per 24 hour  Intake 3032.21 ml  Output 1075 ml  Net 1957.21 ml    Filed Weights   07/23/2021 1714  Weight: 94.8 kg    Examination: General: Adult male, diaphoretic critically, lying in bed in respiratory distress HEENT: MM pink/moist, anicteric, atraumatic, neck supple Neuro: RASS: -1, able to follow intermittent commands, PERRL +3, MAE CV: s1s2 irregular, tachycardic, A-Fib RVR in 170's on monitor, no r/m/g Pulm: Regular, tachypneic & labored on BIPAP, breath sounds coarse crackles RUL, coarse- LUL, diminished with crackles-BLL GI: soft, rounded, non tender, bs x 4 GU: foley in place with clear yellow urine Skin: DTI Right calf, blanchable redness on sacrum Extremities: warm/dry, pulses + 2 R/P, no edema noted  Resolved Hospital Problem list     Assessment & Plan:  Acute Hypoxic Respiratory Failure secondary to HCAP and suspected pulmonary edema Chronic Interstitial Dz? PMHx: Asthma                           05/2021                                                                    04/2016 - Ventilator settings: PC-Automode 10/10 as tolerated - VT has increased on PCV, Flow graphics have improved, still has initial P0.1 in excess  - Wean FiO2 as tolerated, maintain SpO2 > 90% - Head of bed elevated 30 degrees, VAP protocol in place - Plateau pressures less than 30 cm H20  - Intermittent chest x-ray & ABG PRN - Daily WUA with SBT as tolerated  - Ensure adequate pulmonary hygiene  - F/u cultures, trend PCT - Continue HCAP coverage cefepime & linezolid - Bronchodilators PRN - Dilaudid  in place of  Fentanyl while on Zyvox. Propofol drip. - Initiate peripheral phenylephrine drip in the setting of sedatives, wean as tolerated to maintain MAP > 65 - Review of several CT shows question of ongoing and chronic infiltrates some of which GGO - Send serologies and start steroids for now.  - Hold amio - Caution with volume reduction as creatinine is rising.   Elevated BNP, suspect acute HFrEF exacerbation CAD status post recent CABG (06/2021) Atrial fibrillation with RVR Recent echo 06/14/2021 showed preserved LVEF and normal diastolic function.  A-fib RVR reverted to sinus rhythm post intubation and airway support. -Consider restarting outpatient amiodarone, per cardiology recommendation (stopped by [facility] due to concerns for ARDS & amnio toxicity) -Continue warfarin per pharmacy consult - Echocardiogram preserved EF, but RV is overloaded and septum is D shaped - Continuous cardiac monitoring  - Strict I/O's: alert provider if UOP < 0.5 mL/kg/hr - Daily weights to assess volume status - Diurese on hold for elevated creatinine   - Supplemental oxygen as needed,  maintain SpO2 > 90% - Cardiology following, appreciate input  AKI on CKD stage IIIb - Daily BMP, replace electrolytes PRN - Avoid nephrotoxic agents as able, ensure adequate renal perfusion - Lokelma again today - Hold lasix, albumin dose again today - At risk for needing CRRT -If creatinine rising tomorrow will ask nephrology to see, (d/w nephro, will wait another day)  Hyperthyroidism -Continue outpatient regimen: Methimazole  - f/u TSH, T3, T4  Type 2 Diabetes Mellitus - Monitor CBG Q 4 hours - SSI moderate dosing - target range while in ICU: 140-180 - follow ICU hyper/hypo-glycemia protocol   Best Practice (right click and "Reselect all SmartList Selections" daily)  Diet/type: NPO w/ meds via tube DVT prophylaxis: DOAC GI prophylaxis: PPI Lines: N/A Foley:  Yes, and it is still needed Code Status:  full  code Last date of multidisciplinary goals of care discussion [07/20/21]  Labs   CBC: Recent Labs  Lab 07/17/2021 1740 07/20/21 0610 07/21/21 0547 07/22/21 0316 07/23/21 0344 07/23/21 1133 07/23/21 1302 07/23/21 1756 07/24/21 0539  WBC 12.3* 16.7* 13.5* 10.4 8.1  --   --   --  6.7  NEUTROABS 10.5* 14.7* 12.4*  --   --   --   --   --  5.5  HGB 8.5* 8.7* 8.2* 7.2* 6.8* 6.3* 7.9* 8.2* 8.2*  HCT 28.0* 29.3* 29.1* 25.1* 23.8* 22.5* 25.4* 26.2* 26.3*  MCV 87.8 88.5 91.5 90.9 90.5  --   --   --  85.9  PLT 430* 467* 363 324 283  --   --   --  272     Basic Metabolic Panel: Recent Labs  Lab 07/20/21 0610 07/21/21 0547 07/22/21 0316 07/23/21 0344 07/24/21 0539  NA 137 135 136 137 140  K 4.6 6.1* 5.2* 4.8 4.0  CL 105 101 103 104 105  CO2 23 23 22 22 24   GLUCOSE 104* 220* 192* 218* 188*  BUN 30* 48* 70* 87* 85*  CREATININE 2.06* 3.06* 3.98* 3.96* 2.78*  CALCIUM 8.2* 8.3* 7.8* 8.1* 8.2*  MG 1.9 2.3 2.3 2.7* 2.8*  PHOS 4.7* 8.7* 8.2* 7.9* 5.1*    GFR: Estimated Creatinine Clearance: 26.7 mL/min (A) (by C-G formula based on SCr of 2.78 mg/dL (H)). Recent Labs  Lab 07/25/2021 1740 06/26/2021 2333 07/20/21 0610 07/21/21 0547 07/22/21 0316 07/23/21 0344 07/24/21 0539  PROCALCITON  --   --  0.30  --   --   --   --   WBC 12.3*  --  16.7* 13.5* 10.4 8.1 6.7  LATICACIDVEN 1.2 0.9  --   --   --   --   --      Liver Function Tests: Recent Labs  Lab 07/01/2021 1740 07/20/21 0610 07/22/21 0316  AST 84* 126* 80*  ALT 40 54* 47*  ALKPHOS 101 129* 103  BILITOT 0.9 1.6* 1.2  PROT 7.5 7.7 7.0  ALBUMIN 2.3* 2.3* 2.3*    No results for input(s): LIPASE, AMYLASE in the last 168 hours. No results for input(s): AMMONIA in the last 168 hours.  ABG    Component Value Date/Time   PHART 7.32 (L) 07/20/2021 0619   PCO2ART 46 07/20/2021 0619   PO2ART 223 (H) 07/20/2021 0619   HCO3 23.7 07/20/2021 0619   ACIDBASEDEF 2.6 (H) 07/20/2021 0619   O2SAT 98.8 07/20/2021 0619       Coagulation Profile: Recent Labs  Lab 07/20/21 0610 07/21/21 0547 07/22/21 0316 07/23/21 0344 07/24/21 0539  INR 3.0* 2.8* 2.9* 2.7* 2.2*  Cardiac Enzymes: No results for input(s): CKTOTAL, CKMB, CKMBINDEX, TROPONINI in the last 168 hours.  HbA1C: Hgb A1c MFr Bld  Date/Time Value Ref Range Status  07/20/2021 06:10 AM 6.3 (H) 4.8 - 5.6 % Final    Comment:    (NOTE) Pre diabetes:          5.7%-6.4%  Diabetes:              >6.4%  Glycemic control for   <7.0% adults with diabetes   04/23/2016 02:59 AM 6.2 (H) 4.8 - 5.6 % Final    Comment:    (NOTE)         Pre-diabetes: 5.7 - 6.4         Diabetes: >6.4         Glycemic control for adults with diabetes: <7.0     CBG: Recent Labs  Lab 07/23/21 1656 07/23/21 1929 07/23/21 2322 07/24/21 0323 07/24/21 0838  GLUCAP 172* 171* 223* 203* 141*     Review of Systems:   UTA- patient lethargic and in respiratory distress  Past Medical History:  He,  has a past medical history of A-fib ([place]), Abnormal heart rhythm, Arthritis, Asthma, CAP (community acquired pneumonia) (04/22/2016), Diabetes mellitus without complication ([place]), Dysrhythmia, GERD (gastroesophageal reflux disease), Hemoptysis (04/22/2016), Hypercholesteremia, Hypertension, Hyperthyroidism, Kidney stones, and Sepsis ([place]) (04/22/2016).   Surgical History:   Past Surgical History:  Procedure Laterality Date   CATARACT EXTRACTION W/PHACO Left 09/26/2016   Procedure: CATARACT EXTRACTION PHACO AND INTRAOCULAR LENS PLACEMENT (IOC);  Surgeon: Eulogio Bear, MD;  Location: [facility] ORS;  Service: Ophthalmology;  Laterality: Left;  [place] 00:52.0 AP% 17.0 CDE 8.86 Fluid pack lot # [id number]   EYE SURGERY Right 2015   cataract   JOINT REPLACEMENT Right    knee   KNEE SURGERY Right 2015   total knee replacement revision   LEFT HEART CATH AND CORONARY ANGIOGRAPHY N/A 06/25/2021   Procedure: LEFT HEART CATH AND CORONARY ANGIOGRAPHY;  Surgeon: Isaias Cowman,  MD;  Location: [place] [facility];  Service: Cardiovascular;  Laterality: N/A;     Social History:   reports that he has never smoked. He uses smokeless tobacco. He reports that he does not drink alcohol and does not use drugs.   Family History:  His family history includes Dementia in his mother; Heart disease in his father.   Allergies Allergies  Allergen Reactions   Statins Other (See Comments)    Leg weakness   Sulfa Antibiotics Rash     Home Medications  Prior to Admission medications   Medication Sig Start Date End Date Taking? Authorizing Provider  albuterol (VENTOLIN HFA) 108 (90 Base) MCG/ACT inhaler Inhale into the lungs every 6 (six) hours as needed for wheezing or shortness of breath.   Yes [provider]  amiodarone (PACERONE) 200 MG tablet Take 200 mg by mouth daily.   Yes [provider]  aspirin EC 81 MG EC tablet Take 1 tablet (81 mg total) by mouth daily. Swallow whole. 06/25/21  Yes Wouk, Ailene Rud, MD  budesonide-formoterol [facility]) 160-4.5 MCG/ACT inhaler Inhale 2 puffs into the lungs 2 (two) times daily.   Yes [provider]  doxycycline (VIBRA-TABS) 100 MG tablet Take 100 mg by mouth 2 (two) times daily. 07/18/21 07/25/21 Yes [provider]  escitalopram (LEXAPRO) 20 MG tablet Take 20 mg by mouth daily.   Yes [provider]  ezetimibe (ZETIA) 10 MG tablet Take 10 mg by mouth daily.   Yes [provider]  furosemide (LASIX) 40 MG tablet Take 40 mg by mouth.   Yes [provider]  glipiZIDE (GLUCOTROL XL) 10 MG 24 hr tablet Take 10 mg by mouth daily with breakfast.   Yes [provider]  HYDROcodone-acetaminophen (NORCO) 10-325 MG tablet Take 1 tablet by mouth every 6 (six) hours as needed.   Yes [provider]  insulin glargine (LANTUS) 100 UNIT/ML injection Inject 20 Units into the skin daily.   Yes [provider]  metoprolol succinate (TOPROL-XL) 50 MG 24 hr  tablet Take 150 mg by mouth daily. Take with or immediately following a meal.   Yes [provider]  Multiple Vitamins-Minerals (MULTIVITAMIN WITH MINERALS) tablet Take 1 tablet by mouth daily.   Yes [provider]  nystatin (MYCOSTATIN/NYSTOP) powder Apply 1 application. topically daily. Apply to groin topically every day and evening for rash   Yes [provider]  omeprazole (PRILOSEC) 20 MG capsule Take 20 mg by mouth 2 (two) times daily before a meal.   Yes [provider]  polyethylene glycol powder (GLYCOLAX/MIRALAX) 17 GM/SCOOP powder Take 17 g by mouth daily.   Yes [provider]  potassium chloride SA (KLOR-CON M) 20 MEQ tablet Take 20 mEq by mouth daily.   Yes [provider]  predniSONE (DELTASONE) 20 MG tablet Take 20 mg by mouth in the morning and at bedtime. 07/18/21 07/25/21 Yes [provider]  pregabalin (LYRICA) 25 MG capsule Take 1 capsule (25 mg total) by mouth 3 (three) times daily. 06/25/21  Yes Wouk, Ailene Rud, MD  sennosides-docusate sodium (SENOKOT-S) 8.6-50 MG tablet Take 2 tablets by mouth in the morning and at bedtime.   Yes [provider]  traZODone (DESYREL) 50 MG tablet Take 150 mg by mouth at bedtime as needed for sleep. 07/09/21 07/24/21 Yes [provider]  warfarin (COUMADIN) 3 MG tablet Take 3 mg by mouth daily. Take 1.5mg  once daily on Tuesdays,Thursdays,Sundays, and Saturdays Take 3mg  once daily on Mondays, Wednesdays, and Fridays   Yes [provider]  Ampicillin-Sulbactam 3 g in sodium chloride 0.9 % 100 mL Inject 3 g into the vein every 6 (six) hours. 06/25/21   Wouk, Ailene Rud, MD  methimazole (TAPAZOLE) 10 MG tablet Take 1 tablet (10 mg total) by mouth 2 (two) times daily. Patient taking differently: Take 10 mg by mouth daily. 06/25/21   Wouk, Ailene Rud, MD  metoprolol succinate (TOPROL-XL) 25 MG 24 hr tablet Take 1 tablet (25 mg total) by mouth daily. Patient not  taking: Reported on 06/27/2021 06/25/21   Gwynne Edinger, MD  mometasone-formoterol [facility]) 200-5 MCG/ACT AERO Inhale 2 puffs into the lungs 2 (two) times daily. 06/25/21   Wouk, Ailene Rud, MD  pantoprazole (PROTONIX) 40 MG injection Inject 40 mg into the vein every 12 (twelve) hours. 06/25/21   Wouk, Ailene Rud, MD    Critical care provider statement:   Total critical care time:  34 minutes   Performed by: Lanney Gins MD   Critical care time was exclusive of separately billable procedures and treating other patients.   Critical care was necessary to treat or prevent imminent or life-threatening deterioration.   Critical care was time spent personally by me on the following activities: development of treatment plan with patient and/or surrogate as well as nursing, discussions with consultants, evaluation of patient's response to treatment, examination of patient, obtaining history from patient or surrogate, ordering and performing treatments and interventions, ordering and review of laboratory studies,  ordering and review of radiographic studies, pulse oximetry and re-evaluation of patient's condition.    Vida Rigger, M.D.  Pulmonary & Critical Care Medicine

## 2021-07-24 NOTE — Progress Notes (Signed)
   07/24/21 1300  Clinical Encounter Type  Visited With Patient and family together  Visit Type Follow-up;Critical Care  Spiritual Encounters  Spiritual Needs Prayer;Emotional   Chaplain engaged with family shortly after patient was extubated.to provide support and prayer.

## 2021-07-24 NOTE — Progress Notes (Signed)
Inpatient Diabetes Program Recommendations  AACE/ADA: New Consensus Statement on Inpatient Glycemic Control (2015)  Target Ranges:  Prepandial:   less than 140 mg/dL      Peak postprandial:   less than 180 mg/dL (1-2 hours)      Critically ill patients:  140 - 180 mg/dL   Lab Results  Component Value Date   GLUCAP 171 (H) 07/24/2021   HGBA1C 6.3 (H) 07/20/2021    Review of Glycemic Control  Latest Reference Range & Units 07/24/21 03:23 07/24/21 08:38 07/24/21 11:57  Glucose-Capillary 70 - 99 mg/dL 856 (H) 314 (H) 970 (H)   Diabetes history: DM 2 Outpatient Diabetes medications:  Glucotrol XL 10 mg daily,Lantus 10 units daily Current orders for Inpatient glycemic control:  Novolog resistant q 4 hours Solumedrol 60 mg IV q 12 hours  Inpatient Diabetes Program Recommendations:    Agree with current orders.  If blood sugars increase, may consider adding Semglee 10 units daily (1/2 of home basal dose).  Thanks, Beryl Meager, RN, BC-ADM Inpatient Diabetes Coordinator Pager [phone]  (8a-5p)

## 2021-07-24 NOTE — Progress Notes (Signed)
Nutrition Follow-up  DOCUMENTATION CODES:   Not applicable  INTERVENTION:   RD will add supplements and vitamins once pt's diet is advanced.   NUTRITION DIAGNOSIS:   Moderate Malnutrition related to chronic illness (CAD) as evidenced by mild fat depletion, moderate fat depletion, mild muscle depletion, moderate muscle depletion. -ongoing   GOAL:   Patient will meet greater than or equal to 90% of their needs -not met   MONITOR:   Diet advancement, Labs, Weight trends, Skin, I & O's  ASSESSMENT:   76 y/o male with h/o CAD, hypothyroidism, PAF, DM, CHF, CKD III, HTN, chronic pain syndrome, GERD, kidney stones, CVA, DDD and NSTEMI who is admitted with respiratory failure, likely multifactorial, secondary to pneumonia, acute on chronic HFpEF and underlying interstitial lung disease by CT scan.  Pt extubated to bipap today. RD will add supplements and vitamins once pt's diet is advanced. No new weight since admission; will request daily weights. Pt +4.5L on his I & Os.   Medications reviewed and include: aspirin, colace, insulin, tapazole, solu-medrol, protonix, miralax, warfarin, azithromycin, ceftriaxone   Labs reviewed: K 4.0 wnl, BUN 85(H), creat 2.78(H), P 5.1(H), Mg 2.8(H) Hgb 8.2(L), Hct 26.3(L) Cbgs- 171, 141, 203 x 24 hrs  Diet Order:   Diet Order             Diet NPO time specified  Diet effective now                  EDUCATION NEEDS:   Not appropriate for education at this time  Skin:  Skin Assessment: Skin Integrity Issues: Skin Integrity Issues:: Incisions, DTI, Stage I DTI: rt tibial posterior Stage I: coccyx Incisions: sternum  Last BM:  5/30 -type 7  Height:   Ht Readings from Last 1 Encounters:  07/24/21 6' 2.02" (1.88 m)    Weight:   Wt Readings from Last 1 Encounters:  07/10/2021 94.8 kg    Ideal Body Weight:  86.4 kg  BMI:  Body mass index is 26.82 kg/m.  Estimated Nutritional Needs:   Kcal:  2200-2500kcal/day  Protein:   110-125g/day  Fluid:  2.0L/day  Koleen Distance MS, RD, LDN Please refer to [facility] for RD and/or RD on-call/weekend/after hours pager

## 2021-07-24 NOTE — Progress Notes (Signed)
[facility] Cardiology  SUBJECTIVE: Patient intubated   Vitals:   07/24/21 0615 07/24/21 0700 07/24/21 0800 07/24/21 0808  BP:  117/80 124/80 124/80  Pulse: 100   97  Resp: 15 18 17 16   Temp: (!) 96.4 F (35.8 C) (!) 96.3 F (35.7 C) (!) 96.3 F (35.7 C) (!) 96.3 F (35.7 C)  TempSrc:      SpO2: 95%   95%  Weight:      Height:         Intake/Output Summary (Last 24 hours) at 07/24/2021 0830 Last data filed at 07/24/2021 0600 Gross per 24 hour  Intake 2993.83 ml  Output 950 ml  Net 2043.83 ml      PHYSICAL EXAM  General: Intubated, critically ill HEENT:  Normocephalic and atramatic Neck:  No JVD.  Lungs: Clear bilaterally to auscultation and percussion. Heart: HRRR . Normal S1 and S2 without gallops or murmurs.  Abdomen: Bowel sounds are positive, abdomen soft and non-tender  Msk:  Back normal, normal gait. Normal strength and tone for age. Extremities: No clubbing, cyanosis or edema.   Neuro: Intubated Psych: Intubated   LABS: Basic Metabolic Panel: Recent Labs    07/23/21 0344 07/24/21 0539  NA 137 140  K 4.8 4.0  CL 104 105  CO2 22 24  GLUCOSE 218* 188*  BUN 87* 85*  CREATININE 3.96* 2.78*  CALCIUM 8.1* 8.2*  MG 2.7* 2.8*  PHOS 7.9* 5.1*   Liver Function Tests: Recent Labs    07/22/21 0316  AST 80*  ALT 47*  ALKPHOS 103  BILITOT 1.2  PROT 7.0  ALBUMIN 2.3*   No results for input(s): LIPASE, AMYLASE in the last 72 hours. CBC: Recent Labs    07/23/21 0344 07/23/21 1133 07/23/21 1756 07/24/21 0539  WBC 8.1  --   --  6.7  NEUTROABS  --   --   --  5.5  HGB 6.8*   < > 8.2* 8.2*  HCT 23.8*   < > 26.2* 26.3*  MCV 90.5  --   --  85.9  PLT 283  --   --  272   < > = values in this interval not displayed.   Cardiac Enzymes: No results for input(s): CKTOTAL, CKMB, CKMBINDEX, TROPONINI in the last 72 hours. BNP: Invalid input(s): POCBNP D-Dimer: No results for input(s): DDIMER in the last 72 hours. Hemoglobin A1C: No results for input(s):  HGBA1C in the last 72 hours. Fasting Lipid Panel: Recent Labs    07/24/21 0539  TRIG 697*   Thyroid Function Tests: No results for input(s): TSH, T4TOTAL, T3FREE, THYROIDAB in the last 72 hours.  Invalid input(s): FREET3 Anemia Panel: No results for input(s): VITAMINB12, FOLATE, FERRITIN, TIBC, IRON, RETICCTPCT in the last 72 hours.  No results found.   Echo EF 45-50%, moderate RV enlargement with mild reduced RV function  TELEMETRY: Atrial fibrillation 90 bpm:  ASSESSMENT AND PLAN:  Principal Problem:   SOB (shortness of breath) Active Problems:   CAD in native artery   Hyperthyroidism   Paroxysmal atrial fibrillation (HCC)   Type 2 diabetes mellitus with vascular disease (HCC)   Pneumonia   Acute on chronic systolic congestive heart failure (HCC)   Chronic anticoagulation (Coumadin)   Sepsis (HCC)   Acute respiratory failure with hypoxia (HCC)   Pressure injury of skin   Hypoxia    1. Respiratory failure, likely multifactorial, secondary to pneumonia, acute on chronic HFpEF, with underlying interstitial lung disease by CT scan 2.  CAD,  status post CABG x 3, 06/27/2021 at [facility], patient was doing well, enrolled in cardiac rehabilitation, without recent history of chest pain 3.  Borderline elevated troponin, (44, 46, 54), likely demand supply ischemia, in the absence of chest pain or new ECG changes, 4.  Paroxysmal atrial fibrillation, with rapid ventricular rate, rate reasonably controlled, on metoprolol tartrate per tube 5.  AKI/CKD, BUN and creatinine 85 and 2.78, GFR 23 6.  Multisystem organ failure / very poor prognosis 7.  DNR / plan to extubate convert to comfort care   Recommendations   1.  Continue current medications 2.  Continue metoprolol to tartrate for rate and rhythm control, uptitrate as needed 3.  Furosemide IV as needed 4.  Defer further cardiac diagnostics at this time  Sign off for now, please call if any questions   Marcina Millard, MD,  PhD, [facility] 07/24/2021 8:30 AM

## 2021-07-24 NOTE — Consult Note (Signed)
PHARMACY CONSULT NOTE  Pharmacy Consult for Electrolyte Monitoring and Replacement   Recent Labs: Potassium (mmol/L)  Date Value  07/24/2021 4.0   Magnesium (mg/dL)  Date Value  07/37/1062 2.8 (H)   Calcium (mg/dL)  Date Value  69/48/5462 8.2 (L)   Albumin (g/dL)  Date Value  70/35/0093 2.3 (L)   Phosphorus (mg/dL)  Date Value  81/82/9937 5.1 (H)   Sodium (mmol/L)  Date Value  07/24/2021 140   Assessment: Patient is a 76 y/o M with medical history including asthma, DM, HTN, hypothyroidism, Afib, CAD s/p CABG on 07/14/21, CKD, CVA, NSTEMI, HFpEF who is admitted with respiratory failure in setting of pneumonia ultimately requiring intubation. Patient is currently admitted to the ICU where he is intubated, sedated, and on mechanical ventilation.   Goal of Therapy:  Electrolytes within normal limits  Plan:  --no electrolyte replacement warranted for today --Follow-up electrolytes with AM labs tomorrow  Lowella Bandy 07/24/2021 7:11 AM

## 2021-07-24 NOTE — Procedures (Signed)
Extubation Procedure Note  Patient Details:   Name: Alan Stafford DOB: Apr 28, 1945 MRN: [id number]   Airway Documentation:    Vent end date: 07/24/21 Vent end time: 1250   Evaluation  O2 sats: stable throughout and currently acceptable Complications: No apparent complications Patient did tolerate procedure well. Bilateral Breath Sounds: Diminished   No  Patient was extubated to a 4L [place]. Cuff leak was heard. No stridor was noted. Family and RN at the bedside with RT during extubation. RT set patient up on bipap per MD's request.  Danella Maiers [facility] 07/24/2021, 1:16 PM

## 2021-07-24 NOTE — Progress Notes (Signed)
[facility]  ROUNDING NOTE   Subjective:   Patient remains critically ill Sedated with propofol Vent settings 30% FiO2 with 5 PEEP No pressors Foley catheter in place   Objective:  Vital signs in last 24 hours:  Temp:  [96.1 F (35.6 C)-97.9 F (36.6 C)] 97.9 F (36.6 C) (05/30 1200) Pulse Rate:  [48-115] 111 (05/30 1600) Resp:  [14-28] 20 (05/30 1600) BP: (101-149)/(59-91) 143/84 (05/30 1600) SpO2:  [92 %-99 %] 97 % (05/30 1600) FiO2 (%):  [24 %-35 %] 35 % (05/30 1534)  Weight change:  Filed Weights   29-Jul-2021 1714  Weight: 94.8 kg    Intake/Output: I/O last 3 completed shifts: In: 4056.1 [I.V.:1180.3; Blood:357.9; Other:1350; NG/GT:60; IV Piggyback:1107.8] Out: 1900 [Urine:1900]   Intake/Output this shift:  Total I/O In: 714.4 [I.V.:119.7; NG/GT:100; IV Piggyback:494.7] Out: 550 [Urine:550]  Physical Exam: General: Restless, ill-appearing  Head: Normocephalic, atraumatic.   Eyes: Anicteric  Lungs:  Intubated on ventilator  Heart: Irregular rate and rhythm  Abdomen:  Soft, nontender, nondistended  Extremities: No peripheral edema.  Neurologic: Sedated, unable to follow commands  Skin: No lesions  Access: None    Basic Metabolic Panel: Recent Labs  Lab 07/20/21 0610 07/21/21 0547 07/22/21 0316 07/23/21 0344 07/24/21 0539  NA 137 135 136 137 140  K 4.6 6.1* 5.2* 4.8 4.0  CL 105 101 103 104 105  CO2 23 23 22 22 24   GLUCOSE 104* 220* 192* 218* 188*  BUN 30* 48* 70* 87* 85*  CREATININE 2.06* 3.06* 3.98* 3.96* 2.78*  CALCIUM 8.2* 8.3* 7.8* 8.1* 8.2*  MG 1.9 2.3 2.3 2.7* 2.8*  PHOS 4.7* 8.7* 8.2* 7.9* 5.1*     Liver Function Tests: Recent Labs  Lab 07-29-21 1740 07/20/21 0610 07/22/21 0316  AST 84* 126* 80*  ALT 40 54* 47*  ALKPHOS 101 129* 103  BILITOT 0.9 1.6* 1.2  PROT 7.5 7.7 7.0  ALBUMIN 2.3* 2.3* 2.3*    No results for input(s): LIPASE, AMYLASE in the last 168 hours. No results for input(s): AMMONIA in the last 168  hours.  CBC: Recent Labs  Lab Jul 29, 2021 1740 07/20/21 0610 07/21/21 0547 07/22/21 0316 07/23/21 0344 07/23/21 1133 07/23/21 1302 07/23/21 1756 07/24/21 0539  WBC 12.3* 16.7* 13.5* 10.4 8.1  --   --   --  6.7  NEUTROABS 10.5* 14.7* 12.4*  --   --   --   --   --  5.5  HGB 8.5* 8.7* 8.2* 7.2* 6.8* 6.3* 7.9* 8.2* 8.2*  HCT 28.0* 29.3* 29.1* 25.1* 23.8* 22.5* 25.4* 26.2* 26.3*  MCV 87.8 88.5 91.5 90.9 90.5  --   --   --  85.9  PLT 430* 467* 363 324 283  --   --   --  272     Cardiac Enzymes: No results for input(s): CKTOTAL, CKMB, CKMBINDEX, TROPONINI in the last 168 hours.  BNP: Invalid input(s): POCBNP  CBG: Recent Labs  Lab 07/23/21 2322 07/24/21 0323 07/24/21 0838 07/24/21 1157 07/24/21 1606  GLUCAP 223* 203* 141* 171* 142*     Microbiology: Results for orders placed or performed during the hospital encounter of Jul 29, 2021  Blood Culture (routine x 2)     Status: None   Collection Time: 2021-07-29  5:55 PM   Specimen: BLOOD  Result Value Ref Range Status   Specimen Description BLOOD RIGHT FOREARM  Final   Special Requests   Final    BOTTLES DRAWN AEROBIC AND ANAEROBIC Blood Culture results may not be  optimal due to an inadequate volume of blood received in culture bottles   Culture   Final    NO GROWTH 5 DAYS Performed at [facility], [address] [place], [place] [address]    Report Status 07/24/2021 FINAL  Final  Blood Culture (routine x 2)     Status: None   Collection Time: 08/05/2021  5:58 PM   Specimen: BLOOD  Result Value Ref Range Status   Specimen Description BLOOD RIGHT FOREARM  Final   Special Requests   Final    BOTTLES DRAWN AEROBIC AND ANAEROBIC Blood Culture adequate volume   Culture   Final    NO GROWTH 5 DAYS Performed at [facility], [address] [place], [place] [address]    Report Status 07/24/2021 FINAL  Final  SARS Coronavirus 2 by RT PCR (hospital order, performed in [facility] hospital lab) *cepheid single  result test* Anterior Nasal Swab     Status: None   Collection Time: 05-Aug-2021  6:57 PM   Specimen: Anterior Nasal Swab  Result Value Ref Range Status   SARS Coronavirus 2 by RT PCR NEGATIVE NEGATIVE Final    Comment: (NOTE) SARS-CoV-2 target nucleic acids are NOT DETECTED.  The SARS-CoV-2 RNA is generally detectable in upper and lower respiratory specimens during the acute phase of infection. The lowest concentration of SARS-CoV-2 viral copies this assay can detect is 250 copies / mL. A negative result does not preclude SARS-CoV-2 infection and should not be used as the sole basis for treatment or other patient management decisions.  A negative result may occur with improper specimen collection / handling, submission of specimen other than nasopharyngeal swab, presence of viral mutation(s) within the areas targeted by this assay, and inadequate number of viral copies (<250 copies / mL). A negative result must be combined with clinical observations, patient history, and epidemiological information.  Fact Sheet for Patients:   RoadLapTop.co.za  Fact Sheet for Healthcare Providers: http://kim-miller.com/  This test is not yet approved or  cleared by the [place] FDA and has been authorized for detection and/or diagnosis of SARS-CoV-2 by FDA under an Emergency Use Authorization (EUA).  This EUA will remain in effect (meaning this test can be used) for the duration of the COVID-19 declaration under Section 564(b)(1) of the Act, 21 U.S.C. section 360bbb-3(b)(1), unless the authorization is terminated or revoked sooner.  Performed at [facility], [address] [place], [place] [address]   MRSA Next Gen by PCR, Nasal     Status: Abnormal   Collection Time: August 05, 2021 11:33 PM   Specimen: Nasal Swab  Result Value Ref Range Status   MRSA by PCR Next Gen DETECTED (A) NOT DETECTED Final    Comment: RESULT CALLED TO, READ BACK  BY AND VERIFIED WITH: ANTACIA BELL @0124  ON5/26/23 SKL (NOTE) The GeneXpert MRSA Assay (FDA approved for NASAL specimens only), is one component of a comprehensive MRSA colonization surveillance program. It is not intended to diagnose MRSA infection nor to guide or monitor treatment for MRSA infections. Test performance is not FDA approved in patients less than 16 years old. Performed at [facility], [address] [place], [place]   Culture, Respiratory w Gram Stain     Status: None   Collection Time: 07/20/21  6:19 AM   Specimen: Tracheal Aspirate; Respiratory  Result Value Ref Range Status   Specimen Description   Final    TRACHEAL ASPIRATE Performed at [facility], [address] [facility]  [address] [place], [place] [address]    Special Requests   Final    NONE Performed at [facility], [address] [place], [place] [address]    Gram Stain   Final    FEW WBC PRESENT, PREDOMINANTLY PMN NO ORGANISMS SEEN    Culture   Final    RARE Normal respiratory flora-no Staph aureus or Pseudomonas seen Performed at [facility], [address] [place], [place] [address]    Report Status 07/22/2021 FINAL  Final  Respiratory (~20 pathogens) panel by PCR     Status: None   Collection Time: 07/23/21 11:33 AM   Specimen: Nasopharyngeal Swab; Respiratory  Result Value Ref Range Status   Adenovirus NOT DETECTED NOT DETECTED Final   Coronavirus 229E NOT DETECTED NOT DETECTED Final    Comment: (NOTE) The Coronavirus on the Respiratory Panel, DOES NOT test for the novel  Coronavirus (2019 nCoV)    Coronavirus HKU1 NOT DETECTED NOT DETECTED Final   Coronavirus NL63 NOT DETECTED NOT DETECTED Final   Coronavirus OC43 NOT DETECTED NOT DETECTED Final   Metapneumovirus NOT DETECTED NOT DETECTED Final   Rhinovirus / Enterovirus NOT DETECTED NOT DETECTED Final   Influenza A NOT DETECTED NOT DETECTED Final   Influenza B NOT DETECTED NOT DETECTED Final    Parainfluenza Virus 1 NOT DETECTED NOT DETECTED Final   Parainfluenza Virus 2 NOT DETECTED NOT DETECTED Final   Parainfluenza Virus 3 NOT DETECTED NOT DETECTED Final   Parainfluenza Virus 4 NOT DETECTED NOT DETECTED Final   Respiratory Syncytial Virus NOT DETECTED NOT DETECTED Final   Bordetella pertussis NOT DETECTED NOT DETECTED Final   Bordetella Parapertussis NOT DETECTED NOT DETECTED Final   Chlamydophila pneumoniae NOT DETECTED NOT DETECTED Final   Mycoplasma pneumoniae NOT DETECTED NOT DETECTED Final    Comment: Performed at [facility], [address] [place], [place] [address]    Coagulation Studies: Recent Labs    07/22/21 0316 07/23/21 0344 07/24/21 0539  LABPROT 30.5* 28.3* 24.2*  INR 2.9* 2.7* 2.2*     Urinalysis: No results for input(s): COLORURINE, LABSPEC, PHURINE, GLUCOSEU, HGBUR, BILIRUBINUR, KETONESUR, PROTEINUR, UROBILINOGEN, NITRITE, LEUKOCYTESUR in the last 72 hours.  Invalid input(s): APPERANCEUR    Imaging: No results found.   Medications:    albumin human     azithromycin 250 mL/hr at 07/24/21 1237   [START ON 07/25/2021] cefTRIAXone (ROCEPHIN)  IV     dexmedetomidine (PRECEDEX) IV infusion Stopped (07/24/21 1326)    aspirin  81 mg Per Tube Daily   chlorhexidine  15 mL Mouth Rinse BID   Chlorhexidine Gluconate Cloth  6 each Topical Daily   Chlorhexidine Gluconate Cloth  6 each Topical Q0600   docusate  100 mg Per Tube BID   escitalopram  20 mg Per Tube Daily   ezetimibe  10 mg Per Tube Daily   insulin aspart  0-20 Units Subcutaneous Q4H   mouth rinse  15 mL Mouth Rinse q12n4p   methimazole  10 mg Per Tube Daily   methylPREDNISolone (SOLU-MEDROL) injection  60 mg Intravenous Q12H   metoprolol tartrate  12.5 mg Per Tube BID   mupirocin ointment  1 application. Nasal BID   pantoprazole sodium  40 mg Per Tube Daily   polyethylene glycol  17 g Per Tube Daily   pregabalin  25 mg Per Tube TID   sodium chloride flush  10-40 mL  Intracatheter Q12H   warfarin  2.5 mg Oral ONCE-1600  Warfarin - Pharmacist Dosing Inpatient   Does not apply q1600   acetaminophen **OR** acetaminophen, albuterol, fentaNYL (SUBLIMAZE) injection, fentaNYL (SUBLIMAZE) injection, hydrALAZINE, HYDROcodone-acetaminophen, HYDROmorphone (DILAUDID) injection, midazolam, sodium chloride flush  Assessment/ Plan:  [name] is a 76 y.o.  male with medical problems of coronary artery disease with history of non-STEMI, CABG x3 (May 2023 @ [facility]), atrial fibrillation/flutter status post ablation, bilateral carotid artery stenosis, hypertension, hyperlipidemia, type 2 diabetes, hypothyroidism, CKD, COPD, history of stroke, chronic pain  was admitted on 04-Aug-2021 for :SOB (shortness of breath) [R06.02] Acute respiratory failure with hypoxia (HCC) [J96.01] Community acquired pneumonia of right lung, unspecified part of lung [J18.9] Hypoxia [R09.02]   Acute kidney injury with Hyperkalemia Baseline creatinine of 1.6/GFR 45 from Jun 25, 2021 Current urine output of about 500 cc yesterday  Creatinine trends worsening since May 25.  It has now increased to 3.98. AKI likely secondary to ATN from hypotension caused by hemodynamic instability and from pneumonia.  -Improvement in serum creatinine noted since yesterday.  Continue supportive care.  Lab Results  Component Value Date   CREATININE 2.78 (H) 07/24/2021   CREATININE 3.96 (H) 07/23/2021   CREATININE 3.98 (H) 07/22/2021    Intake/Output Summary (Last 24 hours) at 07/24/2021 1631 Last data filed at 07/24/2021 1237 Gross per 24 hour  Intake 2742.31 ml  Output 750 ml  Net 1992.31 ml    2.  Acute respiratory failure.  Remains intubated on ventilator.  ICU team to manage.  3.  Anemia.  Hemoglobin trending down since admission.  Patient received blood transfusion this admission  Palliative care discussions ongoing.    LOS: 5 Deniece Rankin 5/30/20234:31 PM

## 2021-07-25 ENCOUNTER — Inpatient Hospital Stay: Payer: Medicare Other

## 2021-07-25 DIAGNOSIS — R0602 Shortness of breath: Secondary | ICD-10-CM | POA: Diagnosis not present

## 2021-07-25 DIAGNOSIS — Z7189 Other specified counseling: Secondary | ICD-10-CM | POA: Diagnosis not present

## 2021-07-25 LAB — ANCA PROFILE
Anti-MPO Antibodies: <0.2 units (ref 0.0–0.9)
Anti-PR3 Antibodies: <0.2 units (ref 0.0–0.9)
Atypical P-ANCA titer: <1:20 {titer}
C-ANCA: <1:20 {titer}
P-ANCA: <1:20 {titer}

## 2021-07-25 LAB — CBC
HCT: 28.3 % — ABNORMAL LOW (ref 39.0–52.0)
Hemoglobin: 8.6 g/dL — ABNORMAL LOW (ref 13.0–17.0)
MCH: 25.9 pg — ABNORMAL LOW (ref 26.0–34.0)
MCHC: 30.4 g/dL (ref 30.0–36.0)
MCV: 85.2 fL (ref 80.0–100.0)
Platelets: 269 10*3/uL (ref 150–400)
RBC: 3.32 MIL/uL — ABNORMAL LOW (ref 4.22–5.81)
RDW: 16.5 % — ABNORMAL HIGH (ref 11.5–15.5)
WBC: 6.9 10*3/uL (ref 4.0–10.5)
nRBC: 0.9 % — ABNORMAL HIGH (ref 0.0–0.2)

## 2021-07-25 LAB — BASIC METABOLIC PANEL
Anion gap: 10 (ref 5–15)
BUN: 93 mg/dL — ABNORMAL HIGH (ref 8–23)
CO2: 25 mmol/L (ref 22–32)
Calcium: 8.6 mg/dL — ABNORMAL LOW (ref 8.9–10.3)
Chloride: 108 mmol/L (ref 98–111)
Creatinine, Ser: 2.27 mg/dL — ABNORMAL HIGH (ref 0.61–1.24)
GFR, Estimated: 29 mL/min — ABNORMAL LOW (ref >60)
Glucose, Bld: 146 mg/dL — ABNORMAL HIGH (ref 70–99)
Potassium: 4.6 mmol/L (ref 3.5–5.1)
Sodium: 143 mmol/L (ref 135–145)

## 2021-07-25 LAB — GLUCOSE, CAPILLARY
Glucose-Capillary: 129 mg/dL — ABNORMAL HIGH (ref 70–99)
Glucose-Capillary: 126 mg/dL — ABNORMAL HIGH (ref 70–99)
Glucose-Capillary: 127 mg/dL — ABNORMAL HIGH (ref 70–99)
Glucose-Capillary: 104 mg/dL — ABNORMAL HIGH (ref 70–99)
Glucose-Capillary: 148 mg/dL — ABNORMAL HIGH (ref 70–99)

## 2021-07-25 LAB — PROTIME-INR
INR: 1.7 — ABNORMAL HIGH (ref 0.8–1.2)
Prothrombin Time: 19.6 seconds — ABNORMAL HIGH (ref 11.4–15.2)

## 2021-07-25 LAB — PHOSPHORUS: Phosphorus: 4.7 mg/dL — ABNORMAL HIGH (ref 2.5–4.6)

## 2021-07-25 LAB — HEPARIN LEVEL (UNFRACTIONATED): Heparin Unfractionated: 0.58 IU/mL (ref 0.30–0.70)

## 2021-07-25 LAB — MAGNESIUM: Magnesium: 2.5 mg/dL — ABNORMAL HIGH (ref 1.7–2.4)

## 2021-07-25 MED ORDER — HALOPERIDOL LACTATE 5 MG/ML IJ SOLN: 0.5000 mg | INTRAMUSCULAR | Status: DC | PRN

## 2021-07-25 MED ORDER — DIPHENHYDRAMINE HCL 50 MG/ML IJ SOLN: 12.5000 mg | INTRAMUSCULAR | Status: DC | PRN

## 2021-07-25 MED ORDER — PANTOPRAZOLE SODIUM 40 MG IV SOLR
40.0000 mg | INTRAVENOUS | Status: DC
  Administered 2021-07-25: 40 mg via INTRAVENOUS
  Filled 2021-07-25: qty 10

## 2021-07-25 MED ORDER — METHYLPREDNISOLONE SODIUM SUCC 40 MG IJ SOLR
40.0000 mg | Freq: Two times a day (BID) | INTRAMUSCULAR | Status: DC
  Administered 2021-07-25: 40 mg via INTRAVENOUS
  Filled 2021-07-25: qty 1

## 2021-07-25 MED ORDER — HALOPERIDOL LACTATE 5 MG/ML IJ SOLN
2.0000 mg | INTRAMUSCULAR | Status: DC | PRN
Start: 2021-07-25 — End: 2021-07-27

## 2021-07-25 MED ORDER — GLYCOPYRROLATE 0.2 MG/ML IJ SOLN
0.2000 mg | INTRAMUSCULAR | Status: DC | PRN
  Administered 2021-07-25 – 2021-07-26 (×2): 0.2 mg via INTRAVENOUS
  Filled 2021-07-25 (×2): qty 1

## 2021-07-25 MED ORDER — HALOPERIDOL LACTATE 2 MG/ML PO CONC
0.5000 mg | ORAL | Status: DC | PRN
  Filled 2021-07-25: qty 5

## 2021-07-25 MED ORDER — SODIUM CHLORIDE 0.9 % IV SOLN
1.0000 mg/h | INTRAVENOUS | Status: DC
  Administered 2021-07-25: 5 mg/h via INTRAVENOUS
  Filled 2021-07-25 (×2): qty 10

## 2021-07-25 MED ORDER — LORAZEPAM 1 MG PO TABS: 1.0000 mg | ORAL_TABLET | ORAL | Status: DC | PRN

## 2021-07-25 MED ORDER — MORPHINE BOLUS VIA INFUSION
2.0000 mg | INTRAVENOUS | Status: DC | PRN
  Administered 2021-07-25 – 2021-07-26 (×7): 2 mg via INTRAVENOUS

## 2021-07-25 MED ORDER — MORPHINE 100MG IN NS 100ML (1MG/ML) PREMIX INFUSION
1.0000 mg/h | INTRAVENOUS | Status: DC
  Administered 2021-07-25: 1 mg/h via INTRAVENOUS
  Filled 2021-07-25: qty 100

## 2021-07-25 MED ORDER — GLYCOPYRROLATE 0.2 MG/ML IJ SOLN: 0.2000 mg | INTRAMUSCULAR | Status: DC | PRN

## 2021-07-25 MED ORDER — HALOPERIDOL 0.5 MG PO TABS
0.5000 mg | ORAL_TABLET | ORAL | Status: DC | PRN
  Filled 2021-07-25: qty 1

## 2021-07-25 MED ORDER — DEXMEDETOMIDINE HCL IN NACL 400 MCG/100ML IV SOLN
0.4000 ug/kg/h | INTRAVENOUS | Status: DC
  Administered 2021-07-25 – 2021-07-26 (×2): 1 ug/kg/h via INTRAVENOUS
  Filled 2021-07-25 (×3): qty 100

## 2021-07-25 MED ORDER — LORAZEPAM 2 MG/ML PO CONC: 1.0000 mg | ORAL | Status: DC | PRN

## 2021-07-25 MED ORDER — LORAZEPAM 2 MG/ML IJ SOLN
1.0000 mg | INTRAMUSCULAR | Status: DC | PRN
  Administered 2021-07-25: 1 mg via INTRAVENOUS
  Filled 2021-07-25: qty 1

## 2021-07-25 MED ORDER — HEPARIN BOLUS VIA INFUSION
4000.0000 [IU] | Freq: Once | INTRAVENOUS | Status: AC
  Administered 2021-07-25: 4000 [IU] via INTRAVENOUS
  Filled 2021-07-25: qty 4000

## 2021-07-25 MED ORDER — LORAZEPAM 2 MG/ML IJ SOLN
2.0000 mg | INTRAMUSCULAR | Status: DC | PRN
  Administered 2021-07-25: 2 mg via INTRAVENOUS
  Filled 2021-07-25: qty 1

## 2021-07-25 MED ORDER — SODIUM CHLORIDE 0.9 % IV SOLN
100.0000 mg | Freq: Two times a day (BID) | INTRAVENOUS | Status: DC
  Administered 2021-07-25: 100 mg via INTRAVENOUS
  Filled 2021-07-25: qty 100

## 2021-07-25 MED ORDER — GLYCOPYRROLATE 1 MG PO TABS
1.0000 mg | ORAL_TABLET | ORAL | Status: DC | PRN
  Filled 2021-07-25: qty 1

## 2021-07-25 MED ORDER — HEPARIN (PORCINE) 25000 UT/250ML-% IV SOLN
1500.0000 [IU]/h | INTRAVENOUS | Status: DC
Start: 2021-07-25 — End: 2021-07-25
  Administered 2021-07-25: 1500 [IU]/h via INTRAVENOUS
  Filled 2021-07-25: qty 250

## 2021-07-25 NOTE — Progress Notes (Signed)
SLP Cancellation Note  Patient Details Name: Alan Stafford MRN: [id number] DOB: 1945/12/17   Cancelled treatment:       Reason Eval/Treat Not Completed: Patient not medically ready (chart reviewed; consulted NSG re: pt's status this AM) Pt remains on BIPAP this morning per NSG, but the plan is to switch to [place] O2 support this morning. NSG will contact ST services when pt is appropriate for evaluation.       Orinda Kenner, MS, CCC-SLP Speech Language Pathologist Rehab Services; [place] [phone] (ascom) Marshell Rieger 07/25/2021, 8:47 AM

## 2021-07-25 NOTE — TOC Initial Note (Signed)
Transition of Care [facility]) - Initial/Assessment Note    Patient Details  Name: Alan Stafford MRN: [id number] Date of Birth: [birth year]  [facility] CM/SW Contact:    Shelbie Hutching, RN Phone Number: 07/25/2021, 6:44 PM  Clinical Narrative:                 Patient admitted to the hospital for pneumonia, patient came in from [place] where he was staying for short term rehab.   Patient from home with his wife before he was admitted to the hospital in early May for NSTEMI.  He was transferred from [place] to [facility] during that admission.  RNCM met with patient's wife at the bedside, patient is currently on Bipap.  She does not want the patient to return to [facility], if he improves to where he can be discharged she would like to take him home.    TOC will follow.    Expected Discharge Plan: [place] Barriers to Discharge: Continued Medical Work up   Patient Goals and CMS Choice Patient states their goals for this hospitalization and ongoing recovery are:: Wife would like for patient to get better and she wants to take him home CMS Medicare.gov Compare Post Acute Care list provided to:: Patient Represenative (must comment) Choice offered to / list presented to : Spouse  Expected Discharge Plan and Services Expected Discharge Plan: [place]   Discharge Planning Services: CM Consult   Living arrangements for the past 2 months: Single Family Home                 DME Arranged: N/A                    Prior Living Arrangements/Services Living arrangements for the past 2 months: Single Family Home Lives with:: Spouse Patient language and need for interpreter reviewed:: Yes Do you feel safe going back to the place where you live?: Yes      Need for Family Participation in Patient Care: Yes (Comment) Care giver support system in place?: Yes (comment) (wife and children)   Criminal Activity/Legal Involvement  Pertinent to Current Situation/Hospitalization: No - Comment as needed  Activities of Daily Living      Permission Sought/Granted Permission sought to share information with : Case Manager, Family Supports, Other (comment) Permission granted to share information with : Yes, Verbal Permission Granted  Share Information with NAME: Erman Thum     Permission granted to share info w Relationship: wife  Permission granted to share info w Contact Information: [phone]  Emotional Assessment   Attitude/Demeanor/Rapport: Unable to Assess Affect (typically observed): Unable to Assess   Alcohol / Substance Use: Not Applicable Psych Involvement: No (comment)  Admission diagnosis:  SOB (shortness of breath) [R06.02] Acute respiratory failure with hypoxia ([place]) [J96.01] Community acquired pneumonia of right lung, unspecified part of lung [J18.9] Hypoxia [R09.02] Patient Active Problem List   Diagnosis Date Noted   Pressure injury of skin 07/20/2021   Hypoxia 07/20/2021   SOB (shortness of breath) 07/05/2021   Acute respiratory failure with hypoxia ([place]) 07/16/2021   CHF (congestive heart failure), NYHA class II, acute, diastolic ([place]) 65/53/7482   History of CVA (cerebrovascular accident) 06/22/2021   Hyperkalemia 06/21/2021   CKD stage 3 due to type 2 diabetes mellitus ([place]) 06/21/2021   Sepsis ([place]) 06/21/2021   NSTEMI (non-ST elevated myocardial infarction) ([place]) 70/78/6754   Acute metabolic encephalopathy 49/20/1007   Fall  at home, initial encounter (06/02/2021) 06/05/2021   Osteoarthritis of hip (Right) 12/02/2019   Shoulder pain, left 11/03/2019   Occipital stroke ([place]) 09/15/2019   Bilateral hand pain 06/11/2019   Chronic pain of right knee 03/29/2019   Osteoarthritis of hip (Left) 12/02/2018   Chronic sacroiliac joint pain (Right) 10/29/2018   Abnormal MRI, lumbar spine (03/24/2019) 09/17/2018   Compression fracture of L2 lumbar vertebra, sequela 09/17/2018    Lumbosacral spinal stenosis (Multilevel) 09/17/2018   Chronic anticoagulation (Coumadin) 09/01/2018   Bilateral carotid artery stenosis 05/05/2018   Chronic low back pain (2ry area of Pain) (Bilateral) (R>L) w/ sciatica (Bilateral) 08/13/2017   Chronic pain syndrome 08/05/2016   Diabetic peripheral neuropathy ([place]) 42/87/6811   Chronic systolic heart failure ([place]) 06/06/2016   Pneumonia 05/18/2016   Acute on chronic systolic congestive heart failure ([place]) 04/23/2016   Chronic combined systolic and diastolic congestive heart failure ([place]) 04/23/2016   CAP (community acquired pneumonia) 04/22/2016   Controlled substance agreement signed 10/05/2015   Benign non-nodular prostatic hyperplasia with lower urinary tract symptoms 10/05/2015   Cervical facet syndrome (Bilateral) (R>L) 08/17/2015   Osteoarthritis of knee (Left) 08/17/2015   Encounter for therapeutic drug level monitoring 01/17/2015   Encounter for chronic pain management 01/17/2015   Opioid dependence ([place]) 01/17/2015   Lumbar spondylosis 01/03/2015   CAD in native artery 01/03/2015   Acid reflux 01/03/2015   Cardiac murmur 01/03/2015   Hypertriglyceridemia 01/03/2015   Angina pectoris ([place]) 01/03/2015   Long term current use of anticoagulant therapy (Coumadin) 01/03/2015   Bulging lumbar disc (T12-L1, L1-2, L2-3, and L4-5) 01/03/2015   Lumbar foraminal stenosis (Multilevel) (Severe) (Right: L4-5; Left: L3-4) 01/03/2015   Chronic lower extremity pain (1ry area of Pain) (Bilateral) (R>L) 01/03/2015   Chronic hip pain (Right) 01/03/2015   Chronic lumbar radicular pain (Right) 01/03/2015   Myofascial pain syndrome (right suprascapular muscle) 01/03/2015   Chronic neck pain (Bilateral) (R>L) 01/03/2015   Chronic shoulder pain (Right) 01/03/2015   Cervical spondylosis 01/03/2015   Chronic cervical radicular pain (Right) 01/03/2015   Chronic knee pain (3ry area of Pain) (Left) 01/03/2015   Long term current use of opiate analgesic  01/03/2015   Long term prescription opiate use 01/03/2015   Opiate use 01/03/2015   Compression fracture of thoracic vertebra (HCC) (T3, T4, and T8) 01/03/2015   Cervical facet hypertrophy (Bilateral C3-4) 01/03/2015   Cervical nerve root disorder 01/03/2015   Closed wedge fracture of thoracic vertebra, sequela (T3, T4, and T8) 01/03/2015   Atrial fibrillation with RVR ([place]) 04/21/2014   Hypotestosteronism 10/05/2013   Low testosterone 10/05/2013   Encounter for screening for other disorder 07/05/2013   Drug intolerance 07/05/2013   Stage 3b chronic kidney disease ([place]) 12/31/2012   Airway hyperreactivity 09/30/2012   Hyperthyroidism 09/30/2012   Asthma 09/30/2012   Essential hypertension 09/25/2012   Hypercholesterolemia 09/25/2012   Paroxysmal atrial fibrillation ([place]) 09/25/2012   Type 2 diabetes mellitus with vascular disease ([place]) 09/25/2012   DDD (degenerative disc disease), lumbosacral 09/25/2012   PCP:  Raelene Bott, MD Pharmacy:   [id number] [place], [id number]: [phone]: [phone] [id number]: [phone]: [phone]    Social Determinants of Health (SDOH) Interventions    Readmission Risk Interventions    07/25/2021    6:41 PM  06/22/2021    4:08 PM  Readmission Risk Prevention Plan  Transportation Screening Complete Complete  PCP or Specialist Appt within 3-5 Days  Complete  Social Work Consult for [place] Planning/Counseling  Complete  Palliative Care Screening  Not Applicable  Medication Review Press photographer) Complete Complete  PCP or Specialist appointment within 3-5 days of discharge Complete   HRI or Home Care Consult Complete   SW Recovery Care/Counseling Consult Complete   Palliative Care Screening Complete

## 2021-07-25 NOTE — Progress Notes (Signed)
ANTICOAGULATION CONSULT NOTE  Pharmacy Consult for transition of warfarin to heparin Indication: atrial fibrillation  Patient Measurements: Height: 6' 2.02" (188 cm) Weight:  (unable to weigh on bed scale will attempt today) IBW/kg (Calculated) : 82.24 HDW 94.8 kg  Labs: Recent Labs    07/23/21 0344 07/23/21 1133 07/23/21 1756 07/24/21 0539 07/25/21 0530 07/25/21 1806  HGB 6.8*   < > 8.2* 8.2* 8.6*  --   HCT 23.8*   < > 26.2* 26.3* 28.3*  --   PLT 283  --   --  272 269  --   LABPROT 28.3*  --   --  24.2* 19.6*  --   INR 2.7*  --   --  2.2* 1.7*  --   HEPARINUNFRC  --   --   --   --   --  0.58  CREATININE 3.96*  --   --  2.78* 2.27*  --    < > = values in this interval not displayed.    Estimated Creatinine Clearance: 32.7 mL/min (A) (by C-G formula based on SCr of 2.27 mg/dL (H)).  Medical History: Past Medical History:  Diagnosis Date   A-fib (HCC)    Abnormal heart rhythm    Arthritis    Asthma    CAP (community acquired pneumonia) 04/22/2016   Diabetes mellitus without complication (HCC)    Dysrhythmia    afib treated with flecainide   GERD (gastroesophageal reflux disease)    Hemoptysis 04/22/2016   Hypercholesteremia    Hypertension    Hyperthyroidism    Kidney stones    Sepsis (HCC) 04/22/2016    Assessment: 75yo with past medical history of HFpEF, paroxysmal atrial fibrillation (on warfarin, metoprolol, and flecainide), bilateral carotid stenosis who presented to the ED with SOB. INR was therapeutic on admission. Pharmacy is asked to transition from warfarin to IV heparin.   Last warfarin regimen as follows: 3 mg on MoWeFr and 1.5 mg SuTuThSa  Patient is being transitioned from warfarin to IV heparin  Goal of Therapy:  Heparin Level 0.3 - 0.7 Monitor platelets by anticoagulation protocol: Yes   Plan:  --Heparin level is therapeutic x 1 --Continue heparin infusion at 1500 units/hr --Re-check confirmatory HL in 8 hours --Daily CBC per protocol while  on IV heparin  Tressie Ellis 07/25/2021,6:33 PM

## 2021-07-25 NOTE — Progress Notes (Signed)
   07/25/21 2000  Clinical Encounter Type  Visited With Patient and family together  Visit Type Critical Care  Referral From Nurse  Consult/Referral To Chaplain  Spiritual Encounters  Spiritual Needs Grief support;Prayer   Chaplain responded to call to support family as loved one is placed on comfort care.

## 2021-07-25 NOTE — Progress Notes (Signed)
Patient status changed to comfort care by family. Morphine drip initiated as ordered. Bipap removed and 3LNC of O2 placed as requested by patients wife.

## 2021-07-25 NOTE — Progress Notes (Signed)
   07/25/21 1415  Clinical Encounter Type  Visited With Patient and family together  Visit Type Follow-up;Critical Care  Spiritual Encounters  Spiritual Needs Grief support;Prayer   Chaplain provided support to family as they walk through a critical care event with loved one.

## 2021-07-25 NOTE — Progress Notes (Signed)
NAME:  Alan Stafford, MRN:  [id number], DOB:  1946-01-21, LOS: 6 ADMISSION DATE:  06/28/2021, CONSULTATION DATE:  07/20/21 REFERRING MD:  Dr. Nevada Crane, CHIEF COMPLAINT:  Shortness of breath   History of Present Illness:  76 year old male presenting to [facility] ED from rehab via EMS due to worsening shortness of breath, productive cough, acute oxygen requirement of 3.5 L nasal cannula that has been steadily increasing since 07/16/2021 and fatigue in the setting of recent pneumonia diagnosis.  Patient was prescribed antibiotics and prednisone in rehab for presumed pneumonia. Of note patient was recently discharged to rehab from the [facility] post CABG 06/27/2021.  Per Alan Stafford bedside she thinks her mom noted a low-grade fever when he was at the facility but no other complaints that she is aware of.  ED course: Vital signs stable on arrival, however patient remains on acute oxygen.  Imaging revealed worsening pulmonary opacities particularly in the right lung.  Lab work revealed elevated BNP with demand ischemia as troponin elevated but flat in the 40s.  Leukocytosis, however lactic WNL. Due to concern for worsening pneumonia while on antibiotics, TRH consulted for admission to inpatient. medications given: Cefepime, Lasix 20 mg, vancomycin Initial Vitals: 99 F, mildly tachypneic at 22, NSR 74, BP 119/67 and SPO2 96% on 4 L nasal cannula Significant labs: (Labs/ Imaging personally reviewed) I, Domingo Pulse [place], AGACNP-BC, personally viewed and interpreted this ECG. EKG Interpretation: Date: 07/16/2021, EKG Time: 17:09, Rate: 78, Rhythm: NSR, QRS Axis: LAD, Intervals: LAFB, ST/T Wave abnormalities: None, Narrative Interpretation: NSR with LAD & LAFB Chemistry: Na+: 134, K+: 4.3, BUN/Cr.:  27/1.71, Serum CO2/ AG: 22/9, AST: 84 Hematology: WBC: 12.3, Hgb: 8.5,  Troponin: 44> 46, BNP: 752.9, Lactic: 1.2 > 0.9, COVID-19 & Influenza A/B: Negative ABG: 7.42/38/57/24.6 CXR 07/22/2021: Increasing pulmonary opacity  throughout the right lung and in the left lower lobe that could represent developing pneumonia or asymmetric edema  Prior to moving inpatient patient's respiratory status declined, he had increased work of breathing somnolent but arousable to voice as and was placed on BiPAP support.  ABG reassuring, therefore transferred to stepdown unit on BiPAP.  TRH urgently consulted cardiology for assistance in management. PCCM consulted for assistance in management and monitoring due to worsening respiratory status with high risk for intubation. Upon arrival to ICU patient tachypneic, diaphoretic with increased work of breathing and A-fib RVR with heart rate sustaining in the 150s.  Patient also responsive but lethargic.  Alan Stafford brought bedside explaining decision to emergently intubate and place patient on mechanical ventilatory support.   07/23/21- had lengthy conversation with family today. Patient with ILD, AF, CHF, chronic respiratory difficulty with lifelong asthma, DM, Renal failure.   07/24/21- patient seems to be interval unchanged overnight. He is rested on PRVC overnight. He has R PICC TLC. Palliative is following. Patient is extubated but remains restless on BIPAP and required metoprolol for tachycardia and narcotic IV after endorsing pain with head nodding.   07/25/21- patient on BIPAP ovenight with precedex gtt.  He has episodes of aggitation and family concern for opioid withdrawal due to long term narcotic use. We tried nasal canula but he was unable to breath off bipapa.   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  Upon  bedside assessment patient acutely lethargic with increased work of breathing tachypnea and A-fib RVR-decision made to emergently intubate and place the patient on mechanical ventilatory support.   Objective   Blood pressure 126/90, pulse 97, temperature 98.4 F (36.9 C), temperature source Axillary, resp. rate 20, height 6' 2.02" (1.88 m), weight 94.8 kg, SpO2 100 %.    Vent Mode: PSV FiO2 (%):  [24 %-35 %] 35 % Set Rate:  [20 bmp] 20 bmp Vt Set:  [500 mL] 500 mL PEEP:  [5 cmH20] 5 cmH20 Pressure Support:  [5 cmH20] 5 cmH20 Plateau Pressure:  [18 cmH20] 18 cmH20   Intake/Output Summary (Last 24 hours) at 07/25/2021 0747 Last data filed at 07/25/2021 0600 Gross per 24 hour  Intake 1459.91 ml  Output 2700 ml  Net -1240.09 ml    Filed Weights   07/07/2021 1714  Weight: 94.8 kg    Examination: General: Adult male, diaphoretic critically, lying in bed in respiratory distress HEENT: MM pink/moist, anicteric, atraumatic, neck supple Neuro: RASS: -1, able to follow intermittent commands, PERRL +3, MAE CV: s1s2 irregular, tachycardic, A-Fib RVR in 170's on monitor, no r/m/g Pulm: Regular, tachypneic & labored on BIPAP, breath sounds coarse crackles RUL, coarse- LUL, diminished with crackles-BLL GI: soft, rounded, non tender, bs x 4 GU: foley in place with clear yellow urine Skin: DTI Right calf, blanchable redness on sacrum Extremities: warm/dry, pulses + 2 R/P, no edema noted  Resolved Hospital Problem list     Assessment & Plan:  Acute Hypoxic Respiratory Failure secondary to HCAP and suspected pulmonary edema Chronic Interstitial Dz? PMHx: Asthma                           05/2021                                                                    04/2016 on BIPAP - Continue HCAP coverage cefepime & linezolid - Bronchodilators PRN - Dilaudid in place of  Fentanyl while on Zyvox. Propofol drip. - Initiate peripheral phenylephrine drip in the setting of sedatives, wean as  tolerated to maintain MAP > 65 - Review of several CT shows question of ongoing and chronic infiltrates some of which GGO - Send serologies and start steroids for now.  - Hold amio - Caution with volume reduction as creatinine is rising.   Elevated BNP, suspect acute HFrEF exacerbation CAD status post recent CABG (06/2021) Atrial fibrillation with RVR Recent echo 06/14/2021 showed preserved LVEF and normal diastolic function.  A-fib RVR reverted to sinus rhythm post intubation and airway support. -Consider restarting outpatient amiodarone, per cardiology recommendation (stopped by [facility] due to concerns for ARDS & amnio toxicity) -Continue warfarin per pharmacy consult - Echocardiogram preserved EF, but RV is overloaded and septum is D shaped - Continuous cardiac monitoring  - Strict I/O's: alert provider if UOP < 0.5 mL/kg/hr - Daily weights to assess volume status - Diurese on hold for elevated creatinine   - Supplemental oxygen as needed, maintain SpO2 > 90% - Cardiology following, appreciate input  AKI on CKD stage IIIb - Daily BMP, replace electrolytes PRN - Avoid nephrotoxic agents as able, ensure  adequate renal perfusion - Lokelma again today - Hold lasix, albumin dose again today - At risk for needing CRRT -If creatinine rising tomorrow will ask nephrology to see, (d/w nephro, will wait another day)  Hyperthyroidism -Continue outpatient regimen: Methimazole  - f/u TSH, T3, T4  Type 2 Diabetes Mellitus - Monitor CBG Q 4 hours - SSI moderate dosing - target range while in ICU: 140-180 - follow ICU hyper/hypo-glycemia protocol   Best Practice (right click and "Reselect all SmartList Selections" daily)  Diet/type: NPO w/ meds via tube DVT prophylaxis: DOAC GI prophylaxis: PPI Lines: N/A Foley:  Yes, and it is still needed Code Status:  full code Last date of multidisciplinary goals of care discussion [07/20/21]  Labs   CBC: Recent Labs  Lab 07/08/2021 1740  07/20/21 0610 07/21/21 0547 07/22/21 0316 07/23/21 0344 07/23/21 1133 07/23/21 1302 07/23/21 1756 07/24/21 0539 07/25/21 0530  WBC 12.3* 16.7* 13.5* 10.4 8.1  --   --   --  6.7 6.9  NEUTROABS 10.5* 14.7* 12.4*  --   --   --   --   --  5.5  --   HGB 8.5* 8.7* 8.2* 7.2* 6.8* 6.3* 7.9* 8.2* 8.2* 8.6*  HCT 28.0* 29.3* 29.1* 25.1* 23.8* 22.5* 25.4* 26.2* 26.3* 28.3*  MCV 87.8 88.5 91.5 90.9 90.5  --   --   --  85.9 85.2  PLT 430* 467* 363 324 283  --   --   --  272 269     Basic Metabolic Panel: Recent Labs  Lab 07/21/21 0547 07/22/21 0316 07/23/21 0344 07/24/21 0539 07/25/21 0530  NA 135 136 137 140 143  K 6.1* 5.2* 4.8 4.0 4.6  CL 101 103 104 105 108  CO2 23 22 22 24 25   GLUCOSE 220* 192* 218* 188* 146*  BUN 48* 70* 87* 85* 93*  CREATININE 3.06* 3.98* 3.96* 2.78* 2.27*  CALCIUM 8.3* 7.8* 8.1* 8.2* 8.6*  MG 2.3 2.3 2.7* 2.8* 2.5*  PHOS 8.7* 8.2* 7.9* 5.1* 4.7*    GFR: Estimated Creatinine Clearance: 32.7 mL/min (A) (by C-G formula based on SCr of 2.27 mg/dL (H)). Recent Labs  Lab 07/02/2021 1740 07/18/2021 2333 07/20/21 0610 07/21/21 0547 07/22/21 0316 07/23/21 0344 07/24/21 0539 07/25/21 0530  PROCALCITON  --   --  0.30  --   --   --   --   --   WBC 12.3*  --  16.7*   < > 10.4 8.1 6.7 6.9  LATICACIDVEN 1.2 0.9  --   --   --   --   --   --    < > = values in this interval not displayed.     Liver Function Tests: Recent Labs  Lab 07/17/2021 1740 07/20/21 0610 07/22/21 0316  AST 84* 126* 80*  ALT 40 54* 47*  ALKPHOS 101 129* 103  BILITOT 0.9 1.6* 1.2  PROT 7.5 7.7 7.0  ALBUMIN 2.3* 2.3* 2.3*    No results for input(s): LIPASE, AMYLASE in the last 168 hours. No results for input(s): AMMONIA in the last 168 hours.  ABG    Component Value Date/Time   PHART 7.32 (L) 07/20/2021 0619   PCO2ART 46 07/20/2021 0619   PO2ART 223 (H) 07/20/2021 0619   HCO3 23.7 07/20/2021 0619   ACIDBASEDEF 2.6 (H) 07/20/2021 0619   O2SAT 98.8 07/20/2021 0619       Coagulation Profile: Recent Labs  Lab 07/21/21 0547 07/22/21 0316 07/23/21 0344 07/24/21 0539 07/25/21 0530  INR  2.8* 2.9* 2.7* 2.2* 1.7*     Cardiac Enzymes: No results for input(s): CKTOTAL, CKMB, CKMBINDEX, TROPONINI in the last 168 hours.  HbA1C: Hgb A1c MFr Bld  Date/Time Value Ref Range Status  07/20/2021 06:10 AM 6.3 (H) 4.8 - 5.6 % Final    Comment:    (NOTE) Pre diabetes:          5.7%-6.4%  Diabetes:              >6.4%  Glycemic control for   <7.0% adults with diabetes   04/23/2016 02:59 AM 6.2 (H) 4.8 - 5.6 % Final    Comment:    (NOTE)         Pre-diabetes: 5.7 - 6.4         Diabetes: >6.4         Glycemic control for adults with diabetes: <7.0     CBG: Recent Labs  Lab 07/24/21 1606 07/24/21 1941 07/24/21 2346 07/25/21 0337 07/25/21 0724  GLUCAP 142* 126* 151* 129* 126*     Review of Systems:   UTA- patient lethargic and in respiratory distress  Past Medical History:  He,  has a past medical history of A-fib ([place]), Abnormal heart rhythm, Arthritis, Asthma, CAP (community acquired pneumonia) (04/22/2016), Diabetes mellitus without complication ([place]), Dysrhythmia, GERD (gastroesophageal reflux disease), Hemoptysis (04/22/2016), Hypercholesteremia, Hypertension, Hyperthyroidism, Kidney stones, and Sepsis ([place]) (04/22/2016).   Surgical History:   Past Surgical History:  Procedure Laterality Date   CATARACT EXTRACTION W/PHACO Left 09/26/2016   Procedure: CATARACT EXTRACTION PHACO AND INTRAOCULAR LENS PLACEMENT (IOC);  Surgeon: Eulogio Bear, MD;  Location: [facility] ORS;  Service: Ophthalmology;  Laterality: Left;  [place] 00:52.0 AP% 17.0 CDE 8.86 Fluid pack lot # [id number]   EYE SURGERY Right 2015   cataract   JOINT REPLACEMENT Right    knee   KNEE SURGERY Right 2015   total knee replacement revision   LEFT HEART CATH AND CORONARY ANGIOGRAPHY N/A 06/25/2021   Procedure: LEFT HEART CATH AND CORONARY ANGIOGRAPHY;  Surgeon: Isaias Cowman,  MD;  Location: [place] [facility];  Service: Cardiovascular;  Laterality: N/A;     Social History:   reports that he has never smoked. He uses smokeless tobacco. He reports that he does not drink alcohol and does not use drugs.   Family History:  His family history includes Dementia in his mother; Heart disease in his father.   Allergies Allergies  Allergen Reactions   Statins Other (See Comments)    Leg weakness   Sulfa Antibiotics Rash     Home Medications  Prior to Admission medications   Medication Sig Start Date End Date Taking? Authorizing Provider  albuterol (VENTOLIN HFA) 108 (90 Base) MCG/ACT inhaler Inhale into the lungs every 6 (six) hours as needed for wheezing or shortness of breath.   Yes [provider]  amiodarone (PACERONE) 200 MG tablet Take 200 mg by mouth daily.   Yes [provider]  aspirin EC 81 MG EC tablet Take 1 tablet (81 mg total) by mouth daily. Swallow whole. 06/25/21  Yes Wouk, Ailene Rud, MD  budesonide-formoterol [facility]) 160-4.5 MCG/ACT inhaler Inhale 2 puffs into the lungs 2 (two) times daily.   Yes [provider]  doxycycline (VIBRA-TABS) 100 MG tablet Take 100 mg by mouth 2 (two) times daily. 07/18/21 07/25/21 Yes [provider]  escitalopram (LEXAPRO) 20 MG tablet Take 20 mg by mouth daily.   Yes [provider]  ezetimibe (ZETIA) 10 MG tablet Take  10 mg by mouth daily.   Yes [provider]  furosemide (LASIX) 40 MG tablet Take 40 mg by mouth.   Yes [provider]  glipiZIDE (GLUCOTROL XL) 10 MG 24 hr tablet Take 10 mg by mouth daily with breakfast.   Yes [provider]  HYDROcodone-acetaminophen (NORCO) 10-325 MG tablet Take 1 tablet by mouth every 6 (six) hours as needed.   Yes [provider]  insulin glargine (LANTUS) 100 UNIT/ML injection Inject 20 Units into the skin daily.   Yes [provider]  metoprolol succinate (TOPROL-XL) 50 MG 24 hr  tablet Take 150 mg by mouth daily. Take with or immediately following a meal.   Yes [provider]  Multiple Vitamins-Minerals (MULTIVITAMIN WITH MINERALS) tablet Take 1 tablet by mouth daily.   Yes [provider]  nystatin (MYCOSTATIN/NYSTOP) powder Apply 1 application. topically daily. Apply to groin topically every day and evening for rash   Yes [provider]  omeprazole (PRILOSEC) 20 MG capsule Take 20 mg by mouth 2 (two) times daily before a meal.   Yes [provider]  polyethylene glycol powder (GLYCOLAX/MIRALAX) 17 GM/SCOOP powder Take 17 g by mouth daily.   Yes [provider]  potassium chloride SA (KLOR-CON M) 20 MEQ tablet Take 20 mEq by mouth daily.   Yes [provider]  predniSONE (DELTASONE) 20 MG tablet Take 20 mg by mouth in the morning and at bedtime. 07/18/21 07/25/21 Yes [provider]  pregabalin (LYRICA) 25 MG capsule Take 1 capsule (25 mg total) by mouth 3 (three) times daily. 06/25/21  Yes Wouk, Ailene Rud, MD  sennosides-docusate sodium (SENOKOT-S) 8.6-50 MG tablet Take 2 tablets by mouth in the morning and at bedtime.   Yes [provider]  traZODone (DESYREL) 50 MG tablet Take 150 mg by mouth at bedtime as needed for sleep. 07/09/21 07/24/21 Yes [provider]  warfarin (COUMADIN) 3 MG tablet Take 3 mg by mouth daily. Take 1.5mg  once daily on Tuesdays,Thursdays,Sundays, and Saturdays Take 3mg  once daily on Mondays, Wednesdays, and Fridays   Yes [provider]  Ampicillin-Sulbactam 3 g in sodium chloride 0.9 % 100 mL Inject 3 g into the vein every 6 (six) hours. 06/25/21   Wouk, Ailene Rud, MD  methimazole (TAPAZOLE) 10 MG tablet Take 1 tablet (10 mg total) by mouth 2 (two) times daily. Patient taking differently: Take 10 mg by mouth daily. 06/25/21   Wouk, Ailene Rud, MD  metoprolol succinate (TOPROL-XL) 25 MG 24 hr tablet Take 1 tablet (25 mg total) by mouth daily. Patient not  taking: Reported on 06/29/2021 06/25/21   Gwynne Edinger, MD  mometasone-formoterol [facility]) 200-5 MCG/ACT AERO Inhale 2 puffs into the lungs 2 (two) times daily. 06/25/21   Wouk, Ailene Rud, MD  pantoprazole (PROTONIX) 40 MG injection Inject 40 mg into the vein every 12 (twelve) hours. 06/25/21   Wouk, Ailene Rud, MD    Critical care provider statement:   Total critical care time:  34 minutes   Performed by: Lanney Gins MD   Critical care time was exclusive of separately billable procedures and treating other patients.   Critical care was necessary to treat or prevent imminent or life-threatening deterioration.   Critical care was time spent personally by me on the following activities: development of treatment plan with patient and/or surrogate as well as nursing, discussions with consultants, evaluation of patient's response to treatment, examination of patient, obtaining history from patient or surrogate, ordering and performing  treatments and interventions, ordering and review of laboratory studies, ordering and review of radiographic studies, pulse oximetry and re-evaluation of patient's condition.    Ottie Glazier, M.D.  Pulmonary & Critical Care Medicine

## 2021-07-25 NOTE — Progress Notes (Signed)
[facility]  ROUNDING NOTE   Subjective:   Patient remains critically ill Patient is now extubated.  Wife at bedside. No pressors Requiring BiPAP Foley catheter in place   Objective:  Vital signs in last 24 hours:  Temp:  [97.1 F (36.2 C)-99.1 F (37.3 C)] 98 F (36.7 C) (05/31 1200) Pulse Rate:  [47-119] 71 (05/31 1400) Resp:  [12-32] 15 (05/31 1400) BP: (117-157)/(63-142) 134/88 (05/31 1400) SpO2:  [85 %-100 %] 99 % (05/31 1400) FiO2 (%):  [35 %] 35 % (05/31 0916)  Weight change:  Filed Weights   07/07/2021 1714  Weight: 94.8 kg    Intake/Output: I/O last 3 completed shifts: In: 3440.2 [I.V.:669.3; Other:1380; NG/GT:160; IV Piggyback:1231] Out: 2700 [Urine:2200; Stool:500]   Intake/Output this shift:  Total I/O In: 424.1 [I.V.:198.5; IV Piggyback:225.6] Out: -   Physical Exam: General: Restless, ill-appearing  Head: Normocephalic, atraumatic.   Eyes: Anicteric  Lungs:  BiPAP, bilateral basilar crackles  Heart: Irregular rate and rhythm, bradycardic  Abdomen:  Soft, nontender, nondistended  Extremities: + Dependent peripheral edema.  Neurologic: Eyes open, not following commands  Skin: No lesions  Access: None    Basic Metabolic Panel: Recent Labs  Lab 07/21/21 0547 07/22/21 0316 07/23/21 0344 07/24/21 0539 07/25/21 0530  NA 135 136 137 140 143  K 6.1* 5.2* 4.8 4.0 4.6  CL 101 103 104 105 108  CO2 23 22 22 24 25   GLUCOSE 220* 192* 218* 188* 146*  BUN 48* 70* 87* 85* 93*  CREATININE 3.06* 3.98* 3.96* 2.78* 2.27*  CALCIUM 8.3* 7.8* 8.1* 8.2* 8.6*  MG 2.3 2.3 2.7* 2.8* 2.5*  PHOS 8.7* 8.2* 7.9* 5.1* 4.7*     Liver Function Tests: Recent Labs  Lab 07/08/2021 1740 07/20/21 0610 07/22/21 0316  AST 84* 126* 80*  ALT 40 54* 47*  ALKPHOS 101 129* 103  BILITOT 0.9 1.6* 1.2  PROT 7.5 7.7 7.0  ALBUMIN 2.3* 2.3* 2.3*    No results for input(s): LIPASE, AMYLASE in the last 168 hours. No results for input(s): AMMONIA in the last  168 hours.  CBC: Recent Labs  Lab 07/18/2021 1740 07/20/21 0610 07/21/21 0547 07/22/21 0316 07/23/21 0344 07/23/21 1133 07/23/21 1302 07/23/21 1756 07/24/21 0539 07/25/21 0530  WBC 12.3* 16.7* 13.5* 10.4 8.1  --   --   --  6.7 6.9  NEUTROABS 10.5* 14.7* 12.4*  --   --   --   --   --  5.5  --   HGB 8.5* 8.7* 8.2* 7.2* 6.8* 6.3* 7.9* 8.2* 8.2* 8.6*  HCT 28.0* 29.3* 29.1* 25.1* 23.8* 22.5* 25.4* 26.2* 26.3* 28.3*  MCV 87.8 88.5 91.5 90.9 90.5  --   --   --  85.9 85.2  PLT 430* 467* 363 324 283  --   --   --  272 269     Cardiac Enzymes: No results for input(s): CKTOTAL, CKMB, CKMBINDEX, TROPONINI in the last 168 hours.  BNP: Invalid input(s): POCBNP  CBG: Recent Labs  Lab 07/24/21 1941 07/24/21 2346 07/25/21 0337 07/25/21 0724 07/25/21 1122  GLUCAP 126* 151* 129* 126* 127*     Microbiology: Results for orders placed or performed during the hospital encounter of 06/28/2021  Blood Culture (routine x 2)     Status: None   Collection Time: 07/10/2021  5:55 PM   Specimen: BLOOD  Result Value Ref Range Status   Specimen Description BLOOD RIGHT FOREARM  Final   Special Requests   Final  BOTTLES DRAWN AEROBIC AND ANAEROBIC Blood Culture results may not be optimal due to an inadequate volume of blood received in culture bottles   Culture   Final    NO GROWTH 5 DAYS Performed at [facility], [address] [place], [place] [address]    Report Status 07/24/2021 FINAL  Final  Blood Culture (routine x 2)     Status: None   Collection Time: 06/27/2021  5:58 PM   Specimen: BLOOD  Result Value Ref Range Status   Specimen Description BLOOD RIGHT FOREARM  Final   Special Requests   Final    BOTTLES DRAWN AEROBIC AND ANAEROBIC Blood Culture adequate volume   Culture   Final    NO GROWTH 5 DAYS Performed at [facility], [address] [place], [place] [address]    Report Status 07/24/2021 FINAL  Final  SARS Coronavirus 2 by RT PCR (hospital order,  performed in [facility] hospital lab) *cepheid single result test* Anterior Nasal Swab     Status: None   Collection Time: 07/17/2021  6:57 PM   Specimen: Anterior Nasal Swab  Result Value Ref Range Status   SARS Coronavirus 2 by RT PCR NEGATIVE NEGATIVE Final    Comment: (NOTE) SARS-CoV-2 target nucleic acids are NOT DETECTED.  The SARS-CoV-2 RNA is generally detectable in upper and lower respiratory specimens during the acute phase of infection. The lowest concentration of SARS-CoV-2 viral copies this assay can detect is 250 copies / mL. A negative result does not preclude SARS-CoV-2 infection and should not be used as the sole basis for treatment or other patient management decisions.  A negative result may occur with improper specimen collection / handling, submission of specimen other than nasopharyngeal swab, presence of viral mutation(s) within the areas targeted by this assay, and inadequate number of viral copies (<250 copies / mL). A negative result must be combined with clinical observations, patient history, and epidemiological information.  Fact Sheet for Patients:   RoadLapTop.co.za  Fact Sheet for Healthcare Providers: http://kim-miller.com/  This test is not yet approved or  cleared by the [place] FDA and has been authorized for detection and/or diagnosis of SARS-CoV-2 by FDA under an Emergency Use Authorization (EUA).  This EUA will remain in effect (meaning this test can be used) for the duration of the COVID-19 declaration under Section 564(b)(1) of the Act, 21 U.S.C. section 360bbb-3(b)(1), unless the authorization is terminated or revoked sooner.  Performed at [facility], [address] [place], [place] [address]   MRSA Next Gen by PCR, Nasal     Status: Abnormal   Collection Time: 07/06/2021 11:33 PM   Specimen: Nasal Swab  Result Value Ref Range Status   MRSA by PCR Next Gen DETECTED (A) NOT  DETECTED Final    Comment: RESULT CALLED TO, READ BACK BY AND VERIFIED WITH: ANTACIA BELL @0124  ON5/26/23 SKL (NOTE) The GeneXpert MRSA Assay (FDA approved for NASAL specimens only), is one component of a comprehensive MRSA colonization surveillance program. It is not intended to diagnose MRSA infection nor to guide or monitor treatment for MRSA infections. Test performance is not FDA approved in patients less than 25 years old. Performed at [facility], [address] [place], [place]   Culture, Respiratory w Gram Stain     Status: None   Collection Time: 07/20/21  6:19 AM   Specimen: Tracheal Aspirate; Respiratory  Result Value Ref Range Status   Specimen Description   Final  TRACHEAL ASPIRATE Performed at [facility], [address], [place], [place] [address]    Special Requests   Final    NONE Performed at [facility], [address] [place], [place] [address]    Gram Stain   Final    FEW WBC PRESENT, PREDOMINANTLY PMN NO ORGANISMS SEEN    Culture   Final    RARE Normal respiratory flora-no Staph aureus or Pseudomonas seen Performed at [facility], [address], [place], [place] [address]    Report Status 07/22/2021 FINAL  Final  Respiratory (~20 pathogens) panel by PCR     Status: None   Collection Time: 07/23/21 11:33 AM   Specimen: Nasopharyngeal Swab; Respiratory  Result Value Ref Range Status   Adenovirus NOT DETECTED NOT DETECTED Final   Coronavirus 229E NOT DETECTED NOT DETECTED Final    Comment: (NOTE) The Coronavirus on the Respiratory Panel, DOES NOT test for the novel  Coronavirus (2019 nCoV)    Coronavirus HKU1 NOT DETECTED NOT DETECTED Final   Coronavirus NL63 NOT DETECTED NOT DETECTED Final   Coronavirus OC43 NOT DETECTED NOT DETECTED Final   Metapneumovirus NOT DETECTED NOT DETECTED Final   Rhinovirus / Enterovirus NOT DETECTED NOT DETECTED Final   Influenza A NOT DETECTED NOT DETECTED Final    Influenza B NOT DETECTED NOT DETECTED Final   Parainfluenza Virus 1 NOT DETECTED NOT DETECTED Final   Parainfluenza Virus 2 NOT DETECTED NOT DETECTED Final   Parainfluenza Virus 3 NOT DETECTED NOT DETECTED Final   Parainfluenza Virus 4 NOT DETECTED NOT DETECTED Final   Respiratory Syncytial Virus NOT DETECTED NOT DETECTED Final   Bordetella pertussis NOT DETECTED NOT DETECTED Final   Bordetella Parapertussis NOT DETECTED NOT DETECTED Final   Chlamydophila pneumoniae NOT DETECTED NOT DETECTED Final   Mycoplasma pneumoniae NOT DETECTED NOT DETECTED Final    Comment: Performed at [facility], [address] [place], [place] [address]    Coagulation Studies: Recent Labs    07/23/21 0344 07/24/21 0539 07/25/21 0530  LABPROT 28.3* 24.2* 19.6*  INR 2.7* 2.2* 1.7*     Urinalysis: No results for input(s): COLORURINE, LABSPEC, PHURINE, GLUCOSEU, HGBUR, BILIRUBINUR, KETONESUR, PROTEINUR, UROBILINOGEN, NITRITE, LEUKOCYTESUR in the last 72 hours.  Invalid input(s): APPERANCEUR    Imaging: DG Chest Port 1 View  Result Date: 07/25/2021 CLINICAL DATA:  Flash pulmonary edema, atrial fibrillation, history pneumonia, asthma, hypertension EXAM: PORTABLE CHEST 1 VIEW COMPARISON:  Portable exam 1105 hours compared to 07/21/2021 FINDINGS: Post CABG and LEFT atrial appendage clipping. RIGHT arm PICC line tip projects over RIGHT atrium. Enlargement of cardiac silhouette with pulmonary vascular congestion. Diffuse infiltrates bilaterally consistent with pulmonary edema. Probable tiny RIGHT pleural effusion. No pneumothorax. IMPRESSION: Enlargement of cardiac silhouette with pulmonary vascular congestion. Diffuse BILATERAL pulmonary infiltrates likely representing increased pulmonary edema. Electronically Signed   By: Ulyses Southward M.D.   On: 07/25/2021 11:45     Medications:    albumin human     dexmedetomidine (PRECEDEX) IV infusion Stopped (07/25/21 0946)   doxycycline (VIBRAMYCIN) IV      heparin 1,500 Units/hr (07/25/21 1400)    aspirin  81 mg Per Tube Daily   chlorhexidine  15 mL Mouth Rinse BID   Chlorhexidine Gluconate Cloth  6 each Topical Daily   docusate  100 mg Per Tube BID   escitalopram  20 mg Per Tube Daily   ezetimibe  10 mg Per Tube Daily   insulin aspart  0-20 Units  Subcutaneous Q4H   mouth rinse  15 mL Mouth Rinse q12n4p   methimazole  10 mg Per Tube Daily   methylPREDNISolone (SOLU-MEDROL) injection  40 mg Intravenous Q12H   metoprolol tartrate  12.5 mg Per Tube BID   pantoprazole (PROTONIX) IV  40 mg Intravenous Q24H   polyethylene glycol  17 g Per Tube Daily   pregabalin  25 mg Per Tube TID   sodium chloride flush  10-40 mL Intracatheter Q12H   acetaminophen **OR** acetaminophen, albuterol, fentaNYL (SUBLIMAZE) injection, fentaNYL (SUBLIMAZE) injection, hydrALAZINE, HYDROcodone-acetaminophen, HYDROmorphone (DILAUDID) injection, metoprolol tartrate, midazolam, sodium chloride flush  Assessment/ Plan:  Mr. TRAE BOVENZI is a 76 y.o.  male with medical problems of coronary artery disease with history of non-STEMI, CABG x3 (May 2023 @ [facility]), atrial fibrillation/flutter status post ablation, bilateral carotid artery stenosis, hypertension, hyperlipidemia, type 2 diabetes, hypothyroidism, CKD, COPD, history of stroke, chronic pain  was admitted on 08-02-21 for :SOB (shortness of breath) [R06.02] Acute respiratory failure with hypoxia (HCC) [J96.01] Community acquired pneumonia of right lung, unspecified part of lung [J18.9] Hypoxia [R09.02]   Acute kidney injury with Hyperkalemia Baseline creatinine of 1.6/GFR 45 from Jun 25, 2021 Potassium level has improved and is staying in the normal range Creatinine trends as noted below AKI likely secondary to ATN from hypotension caused by hemodynamic instability and from pneumonia.  -Improvement in serum creatinine noted since yesterday.  Continue supportive care.  Lab Results  Component Value Date   CREATININE  2.27 (H) 07/25/2021   CREATININE 2.78 (H) 07/24/2021   CREATININE 3.96 (H) 07/23/2021    Intake/Output Summary (Last 24 hours) at 07/25/2021 1552 Last data filed at 07/25/2021 1400 Gross per 24 hour  Intake 1169.65 ml  Output 2150 ml  Net -980.35 ml    2.  Acute respiratory failure.  Patient was extubated on 5/30.  Requiring BiPAP early in the morning.  Plans to transition to high flow nasal cannula oxygen.  3.  Anemia.  Hemoglobin trending down since admission.  Patient received blood transfusion this admission  Palliative care discussions ongoing.    LOS: 6 Clarivel Callaway 5/31/20233:52 PM

## 2021-07-25 NOTE — Progress Notes (Signed)
ANTICOAGULATION CONSULT NOTE  Pharmacy Consult for transition of warfarin to heparin Indication: atrial fibrillation  Patient Measurements: Height: 6' 2.02" (188 cm) Weight:  (unable to weigh on bed scale will attempt today) IBW/kg (Calculated) : 82.24 HDW 94.8 kg  Labs: Recent Labs    07/23/21 0344 07/23/21 1133 07/23/21 1756 07/24/21 0539 07/25/21 0530  HGB 6.8*   < > 8.2* 8.2* 8.6*  HCT 23.8*   < > 26.2* 26.3* 28.3*  PLT 283  --   --  272 269  LABPROT 28.3*  --   --  24.2* 19.6*  INR 2.7*  --   --  2.2* 1.7*  CREATININE 3.96*  --   --  2.78* 2.27*   < > = values in this interval not displayed.    Estimated Creatinine Clearance: 32.7 mL/min (A) (by C-G formula based on SCr of 2.27 mg/dL (H)).  Medical History: Past Medical History:  Diagnosis Date   A-fib (HCC)    Abnormal heart rhythm    Arthritis    Asthma    CAP (community acquired pneumonia) 04/22/2016   Diabetes mellitus without complication (HCC)    Dysrhythmia    afib treated with flecainide   GERD (gastroesophageal reflux disease)    Hemoptysis 04/22/2016   Hypercholesteremia    Hypertension    Hyperthyroidism    Kidney stones    Sepsis (HCC) 04/22/2016    Assessment: 76yo with past medical history of HFpEF, paroxysmal atrial fibrillation (on warfarin, metoprolol, and flecainide), bilateral carotid stenosis who presented to the ED with SOB. INR was therapeutic on admission. Pharmacy is asked to transition from warfarin to IV heparin.   Last warfarin regimen as follows: 3 mg on MoWeFr and 1.5 mg SuTuThSa  Patient is being transitioned from warfarin to IV Heparin  Goal of Therapy:  Heparin Level 0.3 - 0.7 Monitor platelets by anticoagulation protocol: Yes   Plan:  Bolus 4000 units IV heparin  Start IV heparin infusion at 1500 units/hr Check heparin level 8 hours after infusion started CBC once daily while on IV heparin   Lowella Bandy 07/25/2021,7:36 AM

## 2021-07-25 NOTE — IPAL (Signed)
  Interdisciplinary Goals of Care Family Meeting   Date carried out: 07/25/2021  Location of the meeting: Bedside  Member's involved: Physician, Nurse Practitioner, Bedside Registered Nurse, and Family Member or next of kin  Durable Power of Attorney or acting medical decision maker: Pt's wife Alan Stafford & multiple daughters    Discussion: We discussed goals of care for Alan Stafford .  Dicussed that pt is currently struggling to breathe, with increased work of breathing and assessory muscle use, RR 35-40 despite BiPAP and Precedex (worsening of respiratory status despite BiPAP dependence since extubation yesterday afternoon).  Concern for impending respiratory arrest if we do not intubate soon.  Family was unsure of whether to re-intubate vs. transition to comfort care.  We discussed that if pt is re-intubated, will likely be very difficult to liberate from vent given multiple chronic co-morbidities and advanced age superimposed on acute illness.  They feel that pt would not want to undergo tracheostomy, and that the possibly of living in a facility requiring care would not be an acceptable quality of life for him.  They understand/ state that he is currently struggling to breathe, suffering, and in the dying process.  After discussion with wife and daughters, they request that "he be made comfortable", and that care be withdrawn and transition to comfort measures.  Will start Morphine gtt before removing BiPAP.  As needed ativan, haldol, and glycopyrrolate.  Will continue Precedex gtt for now as well.  Code status: Full DNR  Disposition: In-patient comfort care  Time spent for the meeting: 30 minutes      Harlon Ditty, AGACNP-BC [facility] Prefer epic messenger for cross cover needs If after hours, please call E-link  Judithe Modest, NP  07/25/2021, 7:46 PM

## 2021-07-25 NOTE — Progress Notes (Addendum)
Daily Progress Note   Patient Name: Alan Stafford       Date: 07/25/2021 DOB: 1945/11/21  Age: 76 y.o. MRN#: [id number] Attending Physician: Ottie Glazier, MD Primary Care Physician: Raelene Bott, MD Admit Date: 07/14/2021  Reason for Consultation/Follow-up: Establishing goals of care  Subjective: Patient was extubated yesterday. Has been on BIPAP. Per CCM staff he was able to tolerate high flow cannula for around 20 minutes before becoming SOB. Wife and daughters are at bedside. Patient does not follow commands, and has a  straightforward gaze to wherever his head is turned, and he appears agitated moving arms. Staff has stopped sedation as family really wants him to make decisions for himself.   Attempted to discuss GOC to determine any boundaries on care moving forward. Discussed their thoughts on reintubation and trach if needed. Family deferring to their mother, and mother would like to see how he does on his next attempt at high flow cannula before considering making decisions on if they would want tracheostomy if it were needed. We discussed QOL, and what would be acceptable to them. One of the daughters is very clear no decision needs to be made today, or until they are ready to do so.They would like to continue care stating they remain hopeful and want to take one day at a time. Family to work directly with CCM for the remainder of the week to provide space for decision making.   Length of Stay: 6  Current Medications: Scheduled Meds:   aspirin  81 mg Per Tube Daily   chlorhexidine  15 mL Mouth Rinse BID   Chlorhexidine Gluconate Cloth  6 each Topical Daily   docusate  100 mg Per Tube BID   escitalopram  20 mg Per Tube Daily   ezetimibe  10 mg Per Tube Daily   insulin aspart  0-20  Units Subcutaneous Q4H   mouth rinse  15 mL Mouth Rinse q12n4p   methimazole  10 mg Per Tube Daily   methylPREDNISolone (SOLU-MEDROL) injection  40 mg Intravenous Q12H   metoprolol tartrate  12.5 mg Per Tube BID   pantoprazole (PROTONIX) IV  40 mg Intravenous Q24H   polyethylene glycol  17 g Per Tube Daily   pregabalin  25 mg Per Tube TID   sodium chloride flush  10-40 mL Intracatheter  Q12H    Continuous Infusions:  albumin human     dexmedetomidine (PRECEDEX) IV infusion Stopped (07/25/21 0946)   doxycycline (VIBRAMYCIN) IV     heparin 1,500 Units/hr (07/25/21 1400)    PRN Meds: acetaminophen **OR** acetaminophen, albuterol, fentaNYL (SUBLIMAZE) injection, fentaNYL (SUBLIMAZE) injection, hydrALAZINE, HYDROcodone-acetaminophen, HYDROmorphone (DILAUDID) injection, metoprolol tartrate, midazolam, sodium chloride flush  Physical Exam Pulmonary:     Comments: On BIPAP.  Skin:    General: Skin is warm and dry.  Neurological:     Mental Status: He is alert.     Comments: Does not follow commands.            Vital Signs: BP 134/88   Pulse 71   Temp 98 F (36.7 C) (Axillary)   Resp 15   Ht 6' 2.02" (1.88 m)   Wt 94.8 kg   SpO2 99%   BMI 26.82 kg/m  SpO2: SpO2: 99 % O2 Device: O2 Device: High Flow Nasal Cannula O2 Flow Rate: O2 Flow Rate (L/min): 40 L/min  Intake/output summary:  Intake/Output Summary (Last 24 hours) at 07/25/2021 1430 Last data filed at 07/25/2021 1400 Gross per 24 hour  Intake 1169.65 ml  Output 2150 ml  Net -980.35 ml   LBM: Last BM Date : 07/07/21 Baseline Weight: Weight: 94.8 kg Most recent weight: Weight:  (unable to weigh on bed scale will attempt today)   Patient Active Problem List   Diagnosis Date Noted   Pressure injury of skin 07/20/2021   Hypoxia 07/20/2021   SOB (shortness of breath) 06/25/2021   Acute respiratory failure with hypoxia (HCC) 07/07/2021   CHF (congestive heart failure), NYHA class II, acute, diastolic ([place]) [id number]    History of CVA (cerebrovascular accident) 06/22/2021   Hyperkalemia 06/21/2021   CKD stage 3 due to type 2 diabetes mellitus ([place]) 06/21/2021   Sepsis ([place]) 06/21/2021   NSTEMI (non-ST elevated myocardial infarction) ([place]) [id number]   Acute metabolic encephalopathy [id number]   Fall at home, initial encounter (06/02/2021) 06/05/2021   Osteoarthritis of hip (Right) 12/02/2019   Shoulder pain, left 11/03/2019   Occipital stroke ([place]) 09/15/2019   Bilateral hand pain 06/11/2019   Chronic pain of right knee 03/29/2019   Osteoarthritis of hip (Left) 12/02/2018   Chronic sacroiliac joint pain (Right) 10/29/2018   Abnormal MRI, lumbar spine (03/24/2019) 09/17/2018   Compression fracture of L2 lumbar vertebra, sequela 09/17/2018   Lumbosacral spinal stenosis (Multilevel) 09/17/2018   Chronic anticoagulation (Coumadin) 09/01/2018   Bilateral carotid artery stenosis 05/05/2018   Chronic low back pain (2ry area of Pain) (Bilateral) (R>L) w/ sciatica (Bilateral) 08/13/2017   Chronic pain syndrome 08/05/2016   Diabetic peripheral neuropathy (HCC) [id number]   Chronic systolic heart failure ([place]) 06/06/2016   Pneumonia 05/18/2016   Acute on chronic systolic congestive heart failure ([place]) 04/23/2016   Chronic combined systolic and diastolic congestive heart failure ([place]) 04/23/2016   CAP (community acquired pneumonia) 04/22/2016   Controlled substance agreement signed 10/05/2015   Benign non-nodular prostatic hyperplasia with lower urinary tract symptoms 10/05/2015   Cervical facet syndrome (Bilateral) (R>L) 08/17/2015   Osteoarthritis of knee (Left) 08/17/2015   Encounter for therapeutic drug level monitoring 01/17/2015   Encounter for chronic pain management 01/17/2015   Opioid dependence ([place]) 01/17/2015   Lumbar spondylosis 01/03/2015   CAD in native artery 01/03/2015   Acid reflux 01/03/2015   Cardiac murmur 01/03/2015   Hypertriglyceridemia 01/03/2015   Angina pectoris ([place])  01/03/2015   Long term current use of anticoagulant therapy (Coumadin)  01/03/2015   Bulging lumbar disc (T12-L1, L1-2, L2-3, and L4-5) 01/03/2015   Lumbar foraminal stenosis (Multilevel) (Severe) (Right: L4-5; Left: L3-4) 01/03/2015   Chronic lower extremity pain (1ry area of Pain) (Bilateral) (R>L) 01/03/2015   Chronic hip pain (Right) 01/03/2015   Chronic lumbar radicular pain (Right) 01/03/2015   Myofascial pain syndrome (right suprascapular muscle) 01/03/2015   Chronic neck pain (Bilateral) (R>L) 01/03/2015   Chronic shoulder pain (Right) 01/03/2015   Cervical spondylosis 01/03/2015   Chronic cervical radicular pain (Right) 01/03/2015   Chronic knee pain (3ry area of Pain) (Left) 01/03/2015   Long term current use of opiate analgesic 01/03/2015   Long term prescription opiate use 01/03/2015   Opiate use 01/03/2015   Compression fracture of thoracic vertebra (HCC) (T3, T4, and T8) 01/03/2015   Cervical facet hypertrophy (Bilateral C3-4) 01/03/2015   Cervical nerve root disorder 01/03/2015   Closed wedge fracture of thoracic vertebra, sequela (T3, T4, and T8) 01/03/2015   Atrial fibrillation with RVR ([place]) 04/21/2014   Hypotestosteronism 10/05/2013   Low testosterone 10/05/2013   Encounter for screening for other disorder 07/05/2013   Drug intolerance 07/05/2013   Stage 3b chronic kidney disease ([place]) 12/31/2012   Airway hyperreactivity 09/30/2012   Hyperthyroidism 09/30/2012   Asthma 09/30/2012   Essential hypertension 09/25/2012   Hypercholesterolemia 09/25/2012   Paroxysmal atrial fibrillation ([place]) 09/25/2012   Type 2 diabetes mellitus with vascular disease ([place]) 09/25/2012   DDD (degenerative disc disease), lumbosacral 09/25/2012    Palliative Care Assessment & Plan   Recommendations/Plan: Patient is on BIPAP. Tolerated high flow cannula for around 20 minutes. He is not interactive or responsive to any attempts to communicate during my visit.  They would like to see  how he does on high flow cannula tomorrow before considering making any decisions on reintubation or tracheostomy.    Wife wants to take things one day at a time and she and family want space to make decisions.   PMT will follow up early next week and provide space for decision making.     Code Status:    Code Status Orders  (From admission, onward)           Start     Ordered   07/23/21 1407  Do not attempt resuscitation (DNR)  Continuous       Question Answer Comment  In the event of cardiac or respiratory ARREST Do not call a "code blue"   In the event of cardiac or respiratory ARREST Do not perform Intubation, CPR, defibrillation or ACLS   In the event of cardiac or respiratory ARREST Use medication by any route, position, wound care, and other measures to relive pain and suffering. May use oxygen, suction and manual treatment of airway obstruction as needed for comfort.   Comments per family and palliative care team      07/23/21 1406           Code Status History     Date Active Date Inactive Code Status Order ID Comments User Context   07/06/2021 1957 07/23/2021 1406 Full Code [id number]  Para Skeans, MD ED   06/21/2021 1231 06/26/2021 0156 Full Code [id number]  Collier Bullock, MD ED   05/18/2016 2150 05/23/2016 1514 Full Code [id number]  Baxter Hire, MD Inpatient   04/23/2016 0247 04/25/2016 1454 Full Code [id number]  Lance Coon, MD Inpatient      Prognosis:  Very poor    Care plan was discussed with CCM  Thank you  for allowing the Palliative Medicine Team to assist in the care of this patient.   Asencion Gowda, NP  Please contact Palliative Medicine Team phone at [phone] for questions and concerns.

## 2021-07-25 NOTE — Consult Note (Addendum)
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 Patient is currently admitted to the ICU  and pharmacy is asked to follow and replace electrolytes  Goal of Therapy:  Electrolytes within normal limits  Plan:  --no electrolyte replacement warranted for today --Follow-up electrolytes with AM labs tomorrow  Lowella Bandy 07/25/2021 7:15 AM

## 2021-07-26 NOTE — TOC Progression Note (Signed)
Transition of Care [facility]) - Progression Note    Patient Details  Name: Alan Stafford MRN: [id number] Date of Birth: [birth year]  Transition of Care [facility]) CM/SW Contact  Allayne Butcher, RN Phone Number: 07/26/2021, 4:56 PM  Clinical Narrative:    Family has decided on comfort care and hospice at home.  Family hopes that the patient can be at home in his own environment before he passes.  They choose [facility] for services, referral given to [facility].  Watt Climes is talking to the patient's wife now via phone and will set up equipment.  Hopefully the equipment can be ordered tonight and delivered first thing in the morning and then the patient can be discharged and EMS can be arranged.    Expected Discharge Plan: Home w Hospice Care Barriers to Discharge: Equipment Delay, Other (must enter comment) (hospice refrral given- need equipment delivered)  Expected Discharge Plan and Services Expected Discharge Plan: Home w Hospice Care   Discharge Planning Services: CM Consult Post Acute Care Choice: Hospice Living arrangements for the past 2 months: Single Family Home                 DME Arranged: Hospital bed, Oxygen DME Agency: Other - Comment Date DME Agency Contacted: 07/26/21 Time DME Agency Contacted: [phone] Representative spoke with at DME Agency: [facility] Arranged: NA           Social Determinants of Health (SDOH) Interventions    Readmission Risk Interventions    07/25/2021    6:41 PM 06/22/2021    4:08 PM  Readmission Risk Prevention Plan  Transportation Screening Complete Complete  PCP or Specialist Appt within 3-5 Days  Complete  Social Work Consult for Recovery Care Planning/Counseling  Complete  Palliative Care Screening  Not Applicable  Medication Review Oceanographer) Complete Complete  PCP or Specialist appointment within 3-5 days of discharge Complete   HRI or Home Care Consult Complete   SW Recovery Care/Counseling Consult  Complete   Palliative Care Screening Complete

## 2021-07-26 NOTE — Progress Notes (Signed)
SLP Cancellation Note  Patient Details Name: Alan Stafford MRN: [id number] DOB: 1945/11/26   Cancelled treatment:       Reason Eval/Treat Not Completed:  (chart reviewed) Patient status changed to comfort care by family. ST services will sign off at this time.      Jerilynn Som, MS, CCC-SLP Speech Language Pathologist Rehab Services; [facility] [phone] (ascom) Macgregor Aeschliman 07/26/2021, 4:07 PM

## 2021-07-26 NOTE — Progress Notes (Signed)
   07/26/21 0800  Clinical Encounter Type  Visited With Patient and family together  Visit Type Patient actively dying  Spiritual Encounters  Spiritual Needs Grief support;Prayer   Chaplain provided support to family as they walk through the EOL with loved one.

## 2021-07-26 NOTE — Progress Notes (Signed)
NAME:  YAREL DILDY, MRN:  [id number], DOB:  09/08/1945, LOS: 7 ADMISSION DATE:  07/20/2021, CONSULTATION DATE:  07/20/21 REFERRING MD:  Dr. Nevada Crane, CHIEF COMPLAINT:  Shortness of breath   History of Present Illness:  76 year old male presenting to [facility] ED from rehab via EMS due to worsening shortness of breath, productive cough, acute oxygen requirement of 3.5 L nasal cannula that has been steadily increasing since 07/16/2021 and fatigue in the setting of recent pneumonia diagnosis.  Patient was prescribed antibiotics and prednisone in rehab for presumed pneumonia. Of note patient was recently discharged to rehab from the [facility] post CABG 06/27/2021.  Per daughter bedside she thinks her mom noted a low-grade fever when he was at the facility but no other complaints that she is aware of.  ED course: Vital signs stable on arrival, however patient remains on acute oxygen.  Imaging revealed worsening pulmonary opacities particularly in the right lung.  Lab work revealed elevated BNP with demand ischemia as troponin elevated but flat in the 40s.  Leukocytosis, however lactic WNL. Due to concern for worsening pneumonia while on antibiotics, TRH consulted for admission to inpatient. medications given: Cefepime, Lasix 20 mg, vancomycin Initial Vitals: 99 F, mildly tachypneic at 22, NSR 74, BP 119/67 and SPO2 96% on 4 L nasal cannula Significant labs: (Labs/ Imaging personally reviewed) I, Domingo Pulse [place], AGACNP-BC, personally viewed and interpreted this ECG. EKG Interpretation: Date: 07/18/2021, EKG Time: 17:09, Rate: 78, Rhythm: NSR, QRS Axis: LAD, Intervals: LAFB, ST/T Wave abnormalities: None, Narrative Interpretation: NSR with LAD & LAFB Chemistry: Na+: 134, K+: 4.3, BUN/Cr.:  27/1.71, Serum CO2/ AG: 22/9, AST: 84 Hematology: WBC: 12.3, Hgb: 8.5,  Troponin: 44> 46, BNP: 752.9, Lactic: 1.2 > 0.9, COVID-19 & Influenza A/B: Negative ABG: 7.42/38/57/24.6 CXR 07/11/2021: Increasing pulmonary opacity  throughout the right lung and in the left lower lobe that could represent developing pneumonia or asymmetric edema  Prior to moving inpatient patient's respiratory status declined, he had increased work of breathing somnolent but arousable to voice as and was placed on BiPAP support.  ABG reassuring, therefore transferred to stepdown unit on BiPAP.  TRH urgently consulted cardiology for assistance in management. PCCM consulted for assistance in management and monitoring due to worsening respiratory status with high risk for intubation. Upon arrival to ICU patient tachypneic, diaphoretic with increased work of breathing and A-fib RVR with heart rate sustaining in the 150s.  Patient also responsive but lethargic.  Daughter brought bedside explaining decision to emergently intubate and place patient on mechanical ventilatory support.   07/23/21- had lengthy conversation with family today. Patient with ILD, AF, CHF, chronic respiratory difficulty with lifelong asthma, DM, Renal failure.   07/24/21- patient seems to be interval unchanged overnight. He is rested on PRVC overnight. He has R PICC TLC. Palliative is following. Patient is extubated but remains restless on BIPAP and required metoprolol for tachycardia and narcotic IV after endorsing pain with head nodding.   07/25/21- patient on BIPAP ovenight with precedex gtt.  He has episodes of aggitation and family concern for opioid withdrawal due to long term narcotic use. We tried nasal canula but he was unable to breath off bipap.  07/26/21-patient is now on full comfort care measures. Plan for transfer out of MICU to [facility] service.  Family requests hospice evaluation.  Patient is comfortable.   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  Upon bedside assessment patient acutely lethargic with increased work of breathing tachypnea and A-fib RVR-decision made to emergently intubate and place the patient on mechanical ventilatory support.   Objective   Blood pressure (!) 171/102, pulse 64, temperature 98.1 F (36.7 C), resp. rate 19, height 6' 2.02" (1.88 m), weight 94.8 kg, SpO2 (!) 86 %.    FiO2 (%):  [35 %] 35 %   Intake/Output Summary (Last 24 hours) at 07/26/2021 0846 Last data filed at 07/26/2021 0100 Gross per 24 hour  Intake 747.51 ml  Output 1100 ml  Net -352.49 ml    Filed Weights   07/08/2021 1714  Weight: 94.8 kg    Examination: General: Adult male, diaphoretic critically, lying in bed in respiratory distress HEENT: MM pink/moist, anicteric, atraumatic, neck supple Neuro: RASS: -1, able to follow intermittent commands, PERRL +3, MAE CV: s1s2 irregular, tachycardic, A-Fib RVR in 170's on monitor, no r/m/g Pulm: Regular, tachypneic & labored on BIPAP, breath sounds coarse crackles RUL, coarse- LUL, diminished with crackles-BLL GI: soft, rounded, non tender, bs x 4 GU: foley in place with clear yellow urine Skin: DTI Right calf, blanchable redness on sacrum Extremities: warm/dry, pulses + 2 R/P, no edema noted  Resolved Hospital Problem list     Assessment & Plan:  Acute Hypoxic Respiratory Failure secondary to HCAP and suspected pulmonary edema Chronic Interstitial Dz? PMHx: Asthma                           05/2021                                                                    04/2016 on BIPAP - Continue HCAP coverage cefepime & linezolid - Bronchodilators PRN - Dilaudid in place of  Fentanyl while on Zyvox. Propofol drip. - Initiate peripheral phenylephrine drip in the setting of sedatives, wean as tolerated to maintain MAP > 65 - Review of several  CT shows question of ongoing and chronic infiltrates some of which GGO - Send serologies and start steroids for now.  - Hold amio - Caution with volume reduction as creatinine is rising.   Elevated BNP, suspect acute HFrEF exacerbation CAD status post recent CABG (06/2021) Atrial fibrillation with RVR Recent echo 06/14/2021 showed preserved LVEF and normal diastolic function.  A-fib RVR reverted to sinus rhythm post intubation and airway support. -Consider restarting outpatient amiodarone, per cardiology recommendation (stopped by [facility] due to concerns for ARDS & amnio toxicity) -Continue warfarin per pharmacy consult - Echocardiogram preserved EF, but RV is overloaded and septum is D shaped - Continuous cardiac monitoring  - Strict I/O's: alert provider if UOP < 0.5 mL/kg/hr - Daily weights to assess volume status - Diurese on hold for elevated creatinine   - Supplemental oxygen as needed, maintain SpO2 > 90% - Cardiology following, appreciate input  AKI on CKD stage IIIb - Daily BMP, replace electrolytes PRN - Avoid nephrotoxic agents as able, ensure adequate renal perfusion - Lokelma again today - Hold lasix, albumin dose  again today - At risk for needing CRRT -If creatinine rising tomorrow will ask nephrology to see, (d/w nephro, will wait another day)  Hyperthyroidism -Continue outpatient regimen: Methimazole  - f/u TSH, T3, T4  Type 2 Diabetes Mellitus - Monitor CBG Q 4 hours - SSI moderate dosing - target range while in ICU: 140-180 - follow ICU hyper/hypo-glycemia protocol   Best Practice (right click and "Reselect all SmartList Selections" daily)  Diet/type: NPO w/ meds via tube DVT prophylaxis: DOAC GI prophylaxis: PPI Lines: N/A Foley:  Yes, and it is still needed Code Status:  full code Last date of multidisciplinary goals of care discussion [07/20/21]  Labs   CBC: Recent Labs  Lab 06/26/2021 1740 07/20/21 0610 07/21/21 0547 07/22/21 0316 07/23/21 0344  07/23/21 1133 07/23/21 1302 07/23/21 1756 07/24/21 0539 07/25/21 0530  WBC 12.3* 16.7* 13.5* 10.4 8.1  --   --   --  6.7 6.9  NEUTROABS 10.5* 14.7* 12.4*  --   --   --   --   --  5.5  --   HGB 8.5* 8.7* 8.2* 7.2* 6.8* 6.3* 7.9* 8.2* 8.2* 8.6*  HCT 28.0* 29.3* 29.1* 25.1* 23.8* 22.5* 25.4* 26.2* 26.3* 28.3*  MCV 87.8 88.5 91.5 90.9 90.5  --   --   --  85.9 85.2  PLT 430* 467* 363 324 283  --   --   --  272 269     Basic Metabolic Panel: Recent Labs  Lab 07/21/21 0547 07/22/21 0316 07/23/21 0344 07/24/21 0539 07/25/21 0530  NA 135 136 137 140 143  K 6.1* 5.2* 4.8 4.0 4.6  CL 101 103 104 105 108  CO2 23 22 22 24 25   GLUCOSE 220* 192* 218* 188* 146*  BUN 48* 70* 87* 85* 93*  CREATININE 3.06* 3.98* 3.96* 2.78* 2.27*  CALCIUM 8.3* 7.8* 8.1* 8.2* 8.6*  MG 2.3 2.3 2.7* 2.8* 2.5*  PHOS 8.7* 8.2* 7.9* 5.1* 4.7*    GFR: Estimated Creatinine Clearance: 32.7 mL/min (A) (by C-G formula based on SCr of 2.27 mg/dL (H)). Recent Labs  Lab 07/25/2021 1740 07/07/2021 2333 07/20/21 0610 07/21/21 0547 07/22/21 0316 07/23/21 0344 07/24/21 0539 07/25/21 0530  PROCALCITON  --   --  0.30  --   --   --   --   --   WBC 12.3*  --  16.7*   < > 10.4 8.1 6.7 6.9  LATICACIDVEN 1.2 0.9  --   --   --   --   --   --    < > = values in this interval not displayed.     Liver Function Tests: Recent Labs  Lab 07/22/2021 1740 07/20/21 0610 07/22/21 0316  AST 84* 126* 80*  ALT 40 54* 47*  ALKPHOS 101 129* 103  BILITOT 0.9 1.6* 1.2  PROT 7.5 7.7 7.0  ALBUMIN 2.3* 2.3* 2.3*    No results for input(s): LIPASE, AMYLASE in the last 168 hours. No results for input(s): AMMONIA in the last 168 hours.  ABG    Component Value Date/Time   PHART 7.32 (L) 07/20/2021 0619   PCO2ART 46 07/20/2021 0619   PO2ART 223 (H) 07/20/2021 0619   HCO3 23.7 07/20/2021 0619   ACIDBASEDEF 2.6 (H) 07/20/2021 0619   O2SAT 98.8 07/20/2021 0619      Coagulation Profile: Recent Labs  Lab 07/21/21 0547  07/22/21 0316 07/23/21 0344 07/24/21 0539 07/25/21 0530  INR 2.8* 2.9* 2.7* 2.2* 1.7*     Cardiac Enzymes: No  results for input(s): CKTOTAL, CKMB, CKMBINDEX, TROPONINI in the last 168 hours.  HbA1C: Hgb A1c MFr Bld  Date/Time Value Ref Range Status  07/20/2021 06:10 AM 6.3 (H) 4.8 - 5.6 % Final    Comment:    (NOTE) Pre diabetes:          5.7%-6.4%  Diabetes:              >6.4%  Glycemic control for   <7.0% adults with diabetes   04/23/2016 02:59 AM 6.2 (H) 4.8 - 5.6 % Final    Comment:    (NOTE)         Pre-diabetes: 5.7 - 6.4         Diabetes: >6.4         Glycemic control for adults with diabetes: <7.0     CBG: Recent Labs  Lab 07/25/21 0337 07/25/21 0724 07/25/21 1122 07/25/21 1607 07/25/21 1936  GLUCAP 129* 126* 127* 104* 148*     Review of Systems:   UTA- patient lethargic and in respiratory distress  Past Medical History:  He,  has a past medical history of A-fib ([place]), Abnormal heart rhythm, Arthritis, Asthma, CAP (community acquired pneumonia) (04/22/2016), Diabetes mellitus without complication ([place]), Dysrhythmia, GERD (gastroesophageal reflux disease), Hemoptysis (04/22/2016), Hypercholesteremia, Hypertension, Hyperthyroidism, Kidney stones, and Sepsis ([place]) (04/22/2016).   Surgical History:   Past Surgical History:  Procedure Laterality Date   CATARACT EXTRACTION W/PHACO Left 09/26/2016   Procedure: CATARACT EXTRACTION PHACO AND INTRAOCULAR LENS PLACEMENT (IOC);  Surgeon: Eulogio Bear, MD;  Location: [facility] ORS;  Service: Ophthalmology;  Laterality: Left;  [place] 00:52.0 AP% 17.0 CDE 8.86 Fluid pack lot # [id number]   EYE SURGERY Right 2015   cataract   JOINT REPLACEMENT Right    knee   KNEE SURGERY Right 2015   total knee replacement revision   LEFT HEART CATH AND CORONARY ANGIOGRAPHY N/A 06/25/2021   Procedure: LEFT HEART CATH AND CORONARY ANGIOGRAPHY;  Surgeon: Isaias Cowman, MD;  Location: [place] [facility];  Service:  Cardiovascular;  Laterality: N/A;     Social History:   reports that he has never smoked. He uses smokeless tobacco. He reports that he does not drink alcohol and does not use drugs.   Family History:  His family history includes Dementia in his mother; Heart disease in his father.   Allergies Allergies  Allergen Reactions   Statins Other (See Comments)    Leg weakness   Sulfa Antibiotics Rash     Home Medications  Prior to Admission medications   Medication Sig Start Date End Date Taking? Authorizing Provider  albuterol (VENTOLIN HFA) 108 (90 Base) MCG/ACT inhaler Inhale into the lungs every 6 (six) hours as needed for wheezing or shortness of breath.   Yes [provider]  amiodarone (PACERONE) 200 MG tablet Take 200 mg by mouth daily.   Yes [provider]  aspirin EC 81 MG EC tablet Take 1 tablet (81 mg total) by mouth daily. Swallow whole. 06/25/21  Yes Wouk, Ailene Rud, MD  budesonide-formoterol [facility]) 160-4.5 MCG/ACT inhaler Inhale 2 puffs into the lungs 2 (two) times daily.   Yes [provider]  doxycycline (VIBRA-TABS) 100 MG tablet Take 100 mg by mouth 2 (two) times daily. 07/18/21 07/25/21 Yes [provider]  escitalopram (LEXAPRO) 20 MG tablet Take 20 mg by mouth daily.   Yes [provider]  ezetimibe (ZETIA) 10 MG tablet Take 10 mg by mouth daily.   Yes [provider]  furosemide (LASIX) 40 MG tablet Take 40 mg by mouth.   Yes [provider]  glipiZIDE (GLUCOTROL XL) 10 MG 24 hr tablet Take 10 mg by mouth daily with breakfast.   Yes [provider]  HYDROcodone-acetaminophen (NORCO) 10-325 MG tablet Take 1 tablet by mouth every 6 (six) hours as needed.   Yes [provider]  insulin glargine (LANTUS) 100 UNIT/ML injection Inject 20 Units into the skin daily.   Yes [provider]  metoprolol succinate (TOPROL-XL) 50 MG 24 hr tablet Take 150 mg by mouth daily. Take with or  immediately following a meal.   Yes [provider]  Multiple Vitamins-Minerals (MULTIVITAMIN WITH MINERALS) tablet Take 1 tablet by mouth daily.   Yes [provider]  nystatin (MYCOSTATIN/NYSTOP) powder Apply 1 application. topically daily. Apply to groin topically every day and evening for rash   Yes [provider]  omeprazole (PRILOSEC) 20 MG capsule Take 20 mg by mouth 2 (two) times daily before a meal.   Yes [provider]  polyethylene glycol powder (GLYCOLAX/MIRALAX) 17 GM/SCOOP powder Take 17 g by mouth daily.   Yes [provider]  potassium chloride SA (KLOR-CON M) 20 MEQ tablet Take 20 mEq by mouth daily.   Yes [provider]  predniSONE (DELTASONE) 20 MG tablet Take 20 mg by mouth in the morning and at bedtime. 07/18/21 07/25/21 Yes [provider]  pregabalin (LYRICA) 25 MG capsule Take 1 capsule (25 mg total) by mouth 3 (three) times daily. 06/25/21  Yes Wouk, Ailene Rud, MD  sennosides-docusate sodium (SENOKOT-S) 8.6-50 MG tablet Take 2 tablets by mouth in the morning and at bedtime.   Yes [provider]  traZODone (DESYREL) 50 MG tablet Take 150 mg by mouth at bedtime as needed for sleep. 07/09/21 07/24/21 Yes [provider]  warfarin (COUMADIN) 3 MG tablet Take 3 mg by mouth daily. Take 1.5mg  once daily on Tuesdays,Thursdays,Sundays, and Saturdays Take 3mg  once daily on Mondays, Wednesdays, and Fridays   Yes [provider]  Ampicillin-Sulbactam 3 g in sodium chloride 0.9 % 100 mL Inject 3 g into the vein every 6 (six) hours. 06/25/21   Wouk, Ailene Rud, MD  methimazole (TAPAZOLE) 10 MG tablet Take 1 tablet (10 mg total) by mouth 2 (two) times daily. Patient taking differently: Take 10 mg by mouth daily. 06/25/21   Wouk, Ailene Rud, MD  metoprolol succinate (TOPROL-XL) 25 MG 24 hr tablet Take 1 tablet (25 mg total) by mouth daily. Patient not taking: Reported on 06/25/2021 06/25/21   Gwynne Edinger, MD  mometasone-formoterol [facility]) 200-5 MCG/ACT AERO Inhale 2 puffs into the lungs 2 (two) times daily. 06/25/21   Wouk, Ailene Rud, MD  pantoprazole (PROTONIX) 40 MG injection Inject 40 mg into the vein every 12 (twelve) hours. 06/25/21   Wouk, Ailene Rud, MD    Critical care provider statement:   Total critical care time:  34 minutes   Performed by: Lanney Gins MD   Critical care time was exclusive of separately billable procedures and treating other patients.   Critical care was necessary to treat or prevent imminent or life-threatening deterioration.   Critical care was time spent personally by me on the following activities: development of treatment plan with patient and/or surrogate as well as nursing, discussions with consultants, evaluation of patient's response to treatment, examination of patient, obtaining history from patient or surrogate, ordering and performing treatments and interventions, ordering and review of laboratory studies, ordering and review  of radiographic studies, pulse oximetry and re-evaluation of patient's condition.    Ottie Glazier, M.D.  Pulmonary & Critical Care Medicine

## 2021-07-26 NOTE — Progress Notes (Signed)
0800 Patient on CC. Will continue to monitor and perform ADLs.   1100 TOC orders placed. Family request patient to go home with hospice.    1530 Case manager secure chat regarding TOC and home with hospice for family.   1600 Case manager called and consult requested.   1630 Case manager in room, arrangements made for patient to discharge with hospice to home made. Plan to dc in morning.   1800 Family at bedside, will continue to monitor.    Medications titrated per orders PRN for comfort. Family at bedside.

## 2021-07-26 NOTE — Progress Notes (Signed)
[facility]) Hospital Liaison Note   Received request from Transitions of Care Manager, Pryor Montes, for hospice services at home after discharge. Chart and patient information under review by [facility] physician. Hospice eligibility pending.   Spoke with wife/Linda to initiate education related to hospice philosophy, services, and team approach to care. Vaughan Basta and daughter/Tammy verbalized understanding of information given. Per discussion, the plan is for patient to discharge home via [facility] once cleared to DC.    DME needs discussed. Patient has the following equipment in the home (Purchased privately): N/A Patient requests the following equipment for delivery: Hospital bed Bedside table Oxygen: 3L   Address verified and is correct in the chart. Vaughan Basta is the family member to contact to arrange time of equipment delivery.    Please send signed and completed DNR home with patient/family. Please provide prescriptions at discharge as needed to ensure ongoing symptom management.    [facility] information and contact numbers given to family & above information shared with TOC.   Please call with any questions/concerns.    Thank you for the opportunity to participate in this patient's care.   Daphene Calamity, MSW [facility] Liaison  [phone]

## 2021-07-26 DEATH — deceased

## 2021-07-27 DIAGNOSIS — I5043 Acute on chronic combined systolic (congestive) and diastolic (congestive) heart failure: Secondary | ICD-10-CM

## 2021-07-30 LAB — HYPERSENSITIVITY PNEUMONITIS
A. Pullulans Abs: NEGATIVE
A.Fumigatus #1 Abs: NEGATIVE
Micropolyspora faeni, IgG: NEGATIVE
Pigeon Serum Abs: NEGATIVE
Thermoact. Saccharii: NEGATIVE
Thermoactinomyces vulgaris, IgG: NEGATIVE

## 2021-08-25 NOTE — Death Summary Note (Signed)
DEATH SUMMARY   Patient Details  Name: Alan Stafford MRN: [id number] DOB: 01-Oct-1945 HQI:[id number], [place], MD Admission/Discharge Information   Admit Date:  08/17/21  Date of Death: Date of Death: August 25, 2021  Time of Death: Time of Death: 0744  Length of Stay: 8   Principle Cause of death: Sepsis from healthcare associated pneumonia  Hospital Diagnoses: Active Problems:   Acute respiratory failure with hypoxia (HCC)   Sepsis (HCC)   Acute on chronic combined systolic and diastolic CHF (congestive heart failure) ([place])   HCAP (healthcare-associated pneumonia)   CKD stage 3 due to type 2 diabetes mellitus (HCC)   Paroxysmal atrial fibrillation (HCC)   CAD in native artery   Type 2 diabetes mellitus with vascular disease (HCC)   Chronic anticoagulation (Coumadin)   Hyperthyroidism   Pressure injury of skin   Hospital Course: Patient is a 76 year old male with past medical history of CAD with recent hospitalization for sepsis suspected to be secondary to pneumonia.  That hospital course was complicated by non-STEMI and heart catheterization noted severe two-vessel CAD with complex 95% calcified stenosis proximal LAD, 75% stenosis of the ostial left circumflex and 60% stenosis of the left main and patient was transferred to [facility] on 5/1 for a CABG.  From there, patient was discharged to a skilled nursing facility.    He presented to the emergency room here at [facility] on August 18, 2022 with cough and shortness of breath and admitted for sepsis from pneumonia.  Following admission, patient went into rapid atrial fibrillation and respiratory status further declined and patient was intubated and placed on ventilator.  CT scan of chest noted concerns for interstitial lung disease.  Patient found to have elevated BNP with echocardiogram noting preserved ejection fraction, but right ventricular overload.  Also found to have acute kidney injury which continued to worsen and nephrology consulted.   Kidney injury felt to be secondary to ATN from hypotension caused by hemodynamic instability.  Patient felt to have very poor prognosis or chance for meaningful recovery.  After discussions with critical care and palliative care, decision made to extubate patient 5/30 and see if he would recover.  Decision made not to reintubate even if patient declined.  Patient extubated and then placed on BiPAP requiring Precedex drip for agitation.  Patient's respiratory status worsened and decision made on 5/31 to make patient comfortable.  Patient passed away on 2022-08-26 at 7:44 AM.  Assessment and Plan: Acute respiratory failure with hypoxia (HCC) Multifactorial secondary to CHF with atrial fibrillation component, sepsis and pneumonia and underlying interstitial lung disease.   Sepsis ([place]) Met criteria for severe sepsis due to respiratory failure, pneumonia source, tachycardia, tachypnea.  Despite ventilator support and IV antibiotics and fluids, patient did not recover.   Acute on chronic combined systolic and diastolic CHF (congestive heart failure) (HCC) Echocardiogram done 5/26 noted ejection fraction of 45 to 50% and grade 2 diastolic dysfunction.  Attempts to diurese with Lasix were only moderately successful, but overall, respiratory status was poor.   CKD stage 3 due to type 2 diabetes mellitus ([place]) With AKI secondary to ATN from sepsis.  HCAP (healthcare-associated pneumonia) Possibly aspiration versus healthcare associated pneumonia given patient's timing skilled nursing.  Treated with IV Unasyn.  Unfortunately, due to patient's other comorbidities, he did not survive this hospitalization.  Paroxysmal atrial fibrillation (HCC) Paroxysmal atrial fibrillation and due to worsening respiratory status, went to rapid atrial fibrillation.    CAD in native artery S/p CABG 1 month prior  Chronic anticoagulation (Coumadin) Pharmacy protocol for coumadin for PAF.  Hyperthyroidism Initially on  methimazole.  Once he was intubated, this medication was held  Pressure injury of skin Pressure Injury 07/20/21 Coccyx Medial Stage 1 -  Intact skin with non-blanchable redness of a localized area usually over a bony prominence. non blanchable (Active)  07/20/21 0659  Location: Coccyx  Location Orientation: Medial  Staging: Stage 1 -  Intact skin with non-blanchable redness of a localized area usually over a bony prominence.  Wound Description (Comments): non blanchable  Present on Admission: Yes     Pressure Injury 07/20/21 Tibial Posterior;Proximal;Right Deep Tissue Pressure Injury - Purple or maroon localized area of discolored intact skin or blood-filled blister due to damage of underlying soft tissue from pressure and/or shear. (Active)  07/20/21 0500  Location: Tibial  Location Orientation: Posterior;Proximal;Right  Staging: Deep Tissue Pressure Injury - Purple or maroon localized area of discolored intact skin or blood-filled blister due to damage of underlying soft tissue from pressure and/or shear.  Wound Description (Comments):   Present on Admission: Yes   Stage I coccyx pressure ulceration, present on admission          Procedures: Echocardiogram Ventilator support  Consultations:  -Palliative care -Critical care -Cardiology -Nephrology  The results of significant diagnostics from this hospitalization (including imaging, microbiology, ancillary and laboratory) are listed below for reference.   Significant Diagnostic Studies: DG Abd 1 View  Result Date: 07/21/2021 CLINICAL DATA:  Endotracheal tube adjusted. Nasogastric tube placement. EXAM: PORTABLE CHEST 1 VIEW COMPARISON:  AP chest 07/21/2021 at 0836 hours; upper abdominal radiograph 07/20/2021 FINDINGS: AP chest 07/21/2021 at 951 hours: Endotracheal tube tip terminates approximately 3.0 cm above the carina, appearing at the superior aspect of the clavicular heads. This appears grossly similar to prior.  Measurement tool on the current radiographs is not calibrated, and the tip of the tube measures approximately 2.0 cm from the carina compared to 4.5 cm previously however again these measurements are likely non comparable. Enteric tube again descends below the diaphragm with the tip excluded by collimation. Status post median sternotomy and CABG. Atrial clipping device is again seen. There is again extensive bilateral interstitial thickening and airspace density. No pneumothorax is seen. -- KUB 07/21/2021 at 0952 hours: Enteric tube descends below the diaphragm and curls along the greater curvature of the stomach with the tip overlying the distal stomach. Nonobstructed bowel-gas pattern. IMPRESSION: The endotracheal tube tip again appears to be at the superior aspect of the clavicular heads. No significant change in diffuse airspace disease. Nasogastric tube tip overlies the distal stomach. Electronically Signed   By: Yvonne Kendall M.D.   On: 07/21/2021 22:09   DG Abd 1 View  Result Date: 07/20/2021 CLINICAL DATA:  OG tube placement. EXAM: ABDOMEN - 1 VIEW COMPARISON:  01/28/2020 FINDINGS: OG tube tip is in the distal stomach. Gaseous distension of: Evident with some mildly distended gas-filled small bowel loops in the upper pelvis. IMPRESSION: OG tube tip is in the distal stomach. Electronically Signed   By: Misty Stanley M.D.   On: 07/20/2021 06:00   DG Chest Port 1 View  Result Date: 07/25/2021 CLINICAL DATA:  Flash pulmonary edema, atrial fibrillation, history pneumonia, asthma, hypertension EXAM: PORTABLE CHEST 1 VIEW COMPARISON:  Portable exam 1105 hours compared to 07/21/2021 FINDINGS: Post CABG and LEFT atrial appendage clipping. RIGHT arm PICC line tip projects over RIGHT atrium. Enlargement of cardiac silhouette with pulmonary vascular congestion. Diffuse infiltrates bilaterally consistent with pulmonary edema. Probable  tiny RIGHT pleural effusion. No pneumothorax. IMPRESSION: Enlargement of  cardiac silhouette with pulmonary vascular congestion. Diffuse BILATERAL pulmonary infiltrates likely representing increased pulmonary edema. Electronically Signed   By: Lavonia Dana M.D.   On: 07/25/2021 11:45   DG Chest Port 1 View  Result Date: 07/21/2021 CLINICAL DATA:  Endotracheal tube adjusted. Nasogastric tube placement. EXAM: PORTABLE CHEST 1 VIEW COMPARISON:  AP chest 07/21/2021 at 0836 hours; upper abdominal radiograph 07/20/2021 FINDINGS: AP chest 07/21/2021 at 951 hours: Endotracheal tube tip terminates approximately 3.0 cm above the carina, appearing at the superior aspect of the clavicular heads. This appears grossly similar to prior. Measurement tool on the current radiographs is not calibrated, and the tip of the tube measures approximately 2.0 cm from the carina compared to 4.5 cm previously however again these measurements are likely non comparable. Enteric tube again descends below the diaphragm with the tip excluded by collimation. Status post median sternotomy and CABG. Atrial clipping device is again seen. There is again extensive bilateral interstitial thickening and airspace density. No pneumothorax is seen. -- KUB 07/21/2021 at 0952 hours: Enteric tube descends below the diaphragm and curls along the greater curvature of the stomach with the tip overlying the distal stomach. Nonobstructed bowel-gas pattern. IMPRESSION: The endotracheal tube tip again appears to be at the superior aspect of the clavicular heads. No significant change in diffuse airspace disease. Nasogastric tube tip overlies the distal stomach. Electronically Signed   By: Yvonne Kendall M.D.   On: 07/21/2021 22:09   DG Chest Port 1 View  Result Date: 07/21/2021 CLINICAL DATA:  Altered mental status EXAM: PORTABLE CHEST 1 VIEW COMPARISON:  07/20/2021 FINDINGS: Endotracheal tube with tip at the clavicular heads. The enteric tube reaches the stomach. Extensive artifact from EKG leads. Extensive bilateral interstitial and  airspace density. Cardiomegaly with prior CABG and left atrial clipping. IMPRESSION: Stable hardware positioning and bilateral airspace disease. Electronically Signed   By: Jorje Guild M.D.   On: 07/21/2021 08:55   DG Chest Port 1 View  Result Date: 07/20/2021 CLINICAL DATA:  Endotracheal tube adjustment EXAM: PORTABLE CHEST 1 VIEW COMPARISON:  07/20/2021 FINDINGS: Endotracheal tube is approximately 6 cm above the carina. Enteric tube is present. Temperature probe is present. Persistent right greater than left pulmonary opacities. Aeration is slightly improved. No significant pleural effusion. No pneumothorax. Stable cardiomediastinal contours. IMPRESSION: Lines and tubes as above. Persistent right greater than left pulmonary opacities with slight improvement in aeration. Electronically Signed   By: Macy Mis M.D.   On: 07/20/2021 08:24   DG Chest Port 1 View  Result Date: 07/20/2021 CLINICAL DATA:  Status post intubation. EXAM: PORTABLE CHEST 1 VIEW COMPARISON:  06/25/2021 FINDINGS: 0543 hours. Endotracheal tube tip is approximately 4.1 cm above the base of the carina. The NG tube passes into the stomach although the distal tip position is not included on the film. Esophageal temperature probe evident. Similar low volume film. The cardio pericardial silhouette is enlarged. Diffuse airspace disease in the right lung is similar to prior as is the patchy left upper lobe airspace opacity and more consolidative disease in the left lower lung. No substantial pleural effusion. Telemetry leads overlie the chest. IMPRESSION: 1. Endotracheal tube tip is approximately 4.1 cm above the base of the carina. 2. Persistent bilateral airspace disease, left greater than right and not substantially changed. Electronically Signed   By: Misty Stanley M.D.   On: 07/20/2021 06:15   DG Chest Port 1 View  Result Date: 06/27/2021 CLINICAL  DATA:  Question sepsis EXAM: PORTABLE CHEST 1 VIEW COMPARISON:  06/21/2021  FINDINGS: Previous median sternotomy, CABG and atrial clip. Worsening low pulmonary density throughout the right lung and in the left lower lung suggesting pneumonia. Asymmetric edema could have a similar pattern. No dense consolidation or lobar collapse. No visible effusion. IMPRESSION: Increasing pulmonary opacity throughout the right lung and in the left lower lobe that could represent developing pneumonia or asymmetric edema. Electronically Signed   By: Nelson Chimes M.D.   On: 07/23/2021 17:59   ECHOCARDIOGRAM COMPLETE  Result Date: 07/20/2021    ECHOCARDIOGRAM REPORT   Patient Name:   RYLIE KNIERIM Date of Exam: 07/20/2021 Medical Rec #:  [id number]     Height:       74.0 in Accession #:    [id number]    Weight:       209.0 lb Date of Birth:  March 15, 1945     BSA:          2.215 m Patient Age:    20 years      BP:           101/56 mmHg Patient Gender: M             HR:           70 bpm. Exam Location:  [facility] Procedure: 2D Echo, Color Doppler and Cardiac Doppler Indications:     I42.9 Cardiomyopathy-unspecified  History:         Patient has prior history of Echocardiogram examinations, most                  recent 06/22/2021. Prior CABG; Risk Factors:Hypertension, HCL                  and Diabetes.  Sonographer:     Charmayne Sheer Referring Phys:  [id number] BRITTON L R[place] Diagnosing Phys: Donnelly Angelica  Sonographer Comments: Echo performed with patient supine and on artificial respirator. IMPRESSIONS  1. Left ventricular ejection fraction, by estimation, is 45 to 50%. Left ventricular ejection fraction by 2D MOD biplane is 51.0 %. The left ventricle has mildly decreased function. The left ventricle has no regional wall motion abnormalities. There is mild left ventricular hypertrophy. Left ventricular diastolic parameters are consistent with Grade II diastolic dysfunction (pseudonormalization). There is the interventricular septum is flattened in diastole ('D' shaped left ventricle), consistent with right  ventricular volume overload.  2. Right ventricular systolic function is mildly reduced. The right ventricular size is mildly enlarged. There is moderately elevated pulmonary artery systolic pressure.  3. The mitral valve is normal in structure. Mild to moderate mitral valve regurgitation. No evidence of mitral stenosis.  4. Tricuspid valve regurgitation is moderate.  5. The aortic valve is grossly normal. Aortic valve regurgitation is not visualized. Aortic valve sclerosis is present, with no evidence of aortic valve stenosis.  6. Aortic dilatation noted. There is mild dilatation of the ascending aorta, measuring 43 mm. Comparison(s): LV function is slightly reduced, and RV pressure is more elevated. FINDINGS  Left Ventricle: Left ventricular ejection fraction, by estimation, is 45 to 50%. Left ventricular ejection fraction by 2D MOD biplane is 51.0 %. The left ventricle has mildly decreased function. The left ventricle has no regional wall motion abnormalities. The left ventricular internal cavity size was normal in size. There is mild left ventricular hypertrophy. Abnormal (paradoxical) septal motion consistent with post-operative status and the interventricular septum is flattened in diastole ('D' shaped left ventricle), consistent with right ventricular  volume overload. Left ventricular diastolic parameters are consistent with Grade II diastolic dysfunction (pseudonormalization). Right Ventricle: The right ventricular size is mildly enlarged. Right vetricular wall thickness was not well visualized. Right ventricular systolic function is mildly reduced. There is moderately elevated pulmonary artery systolic pressure. The tricuspid  regurgitant velocity is 3.71 m/s, and with an assumed right atrial pressure of 5 mmHg, the estimated right ventricular systolic pressure is 32.7 mmHg. Left Atrium: Left atrial size was normal in size. Right Atrium: Right atrial size was normal in size. Pericardium: There is no evidence  of pericardial effusion. Mitral Valve: The mitral valve is normal in structure. Mild to moderate mitral valve regurgitation. No evidence of mitral valve stenosis. MV peak gradient, 4.8 mmHg. The mean mitral valve gradient is 2.0 mmHg. Tricuspid Valve: The tricuspid valve is normal in structure. Tricuspid valve regurgitation is moderate. Aortic Valve: The aortic valve is grossly normal. Aortic valve regurgitation is not visualized. Aortic valve sclerosis is present, with no evidence of aortic valve stenosis. Aortic valve mean gradient measures 7.0 mmHg. Aortic valve peak gradient measures 13.7 mmHg. Aortic valve area, by VTI measures 2.82 cm. Pulmonic Valve: The pulmonic valve was normal in structure. Pulmonic valve regurgitation is mild. No evidence of pulmonic stenosis. Aorta: Aortic dilatation noted. There is mild dilatation of the ascending aorta, measuring 43 mm. Venous: IVC assessment for right atrial pressure unable to be performed due to mechanical ventilation. IAS/Shunts: No atrial level shunt detected by color flow Doppler.  LEFT VENTRICLE PLAX 2D                        Biplane EF (MOD) LVIDd:         4.71 cm         LV Biplane EF:   Left LVIDs:         3.79 cm                          ventricular LV PW:         1.33 cm                          ejection LV IVS:        1.01 cm                          fraction by LVOT diam:     2.50 cm                          2D MOD LV SV:         94                               biplane is LV SV Index:   42                               51.0 %. LVOT Area:     4.91 cm                                Diastology  LV e' medial:    5.87 cm/s LV Volumes (MOD)               LV E/e' medial:  17.9 LV vol d, MOD    107.0 ml      LV e' lateral:   9.36 cm/s A2C:                           LV E/e' lateral: 11.2 LV vol d, MOD    121.0 ml A4C: LV vol s, MOD    54.3 ml A2C: LV vol s, MOD    58.8 ml A4C: LV SV MOD A2C:   52.7 ml LV SV MOD A4C:   121.0 ml LV SV  MOD BP:    58.7 ml RIGHT VENTRICLE RV Basal diam:  3.15 cm RV S prime:     7.29 cm/s LEFT ATRIUM             Index        RIGHT ATRIUM           Index LA diam:        4.30 cm 1.94 cm/m   RA Area:     15.60 cm LA Vol (A2C):   53.3 ml 24.07 ml/m  RA Volume:   33.80 ml  15.26 ml/m LA Vol (A4C):   59.0 ml 26.64 ml/m LA Biplane Vol: 59.9 ml 27.05 ml/m  AORTIC VALVE                     PULMONIC VALVE AV Area (Vmax):    2.68 cm      PV Vmax:          1.12 m/s AV Area (Vmean):   2.34 cm      PV Vmean:         71.100 cm/s AV Area (VTI):     2.82 cm      PV VTI:           0.191 m AV Vmax:           185.00 cm/s   PV Peak grad:     5.0 mmHg AV Vmean:          130.000 cm/s  PV Mean grad:     2.0 mmHg AV VTI:            0.333 m       PR End Diast Vel: 15.68 msec AV Peak Grad:      13.7 mmHg AV Mean Grad:      7.0 mmHg LVOT Vmax:         101.00 cm/s LVOT Vmean:        62.000 cm/s LVOT VTI:          0.191 m LVOT/AV VTI ratio: 0.57  AORTA Ao Root diam: 3.70 cm MITRAL VALVE                TRICUSPID VALVE MV Area (PHT): 4.31 cm     TR Peak grad:   55.1 mmHg MV Area VTI:   3.21 cm     TR Vmax:        371.00 cm/s MV Peak grad:  4.8 mmHg MV Mean grad:  2.0 mmHg     SHUNTS MV Vmax:       1.10 m/s     Systemic VTI:  0.19 m MV Vmean:      65.2 cm/s    Systemic Diam:  2.50 cm MV Decel Time: 176 msec MR Peak grad: 87.2 mmHg MR Vmax:      467.00 cm/s MV E velocity: 105.00 cm/s MV A velocity: 34.70 cm/s MV E/A ratio:  3.03 Donnelly Angelica Electronically signed by Donnelly Angelica Signature Date/Time: 07/20/2021/1:34:48 PM    Final    [place] EKG SITE RITE  Result Date: 07/21/2021 If Site Rite image not attached, placement could not be confirmed due to current cardiac rhythm.   Microbiology: Recent Results (from the past 240 hour(s))  Blood Culture (routine x 2)     Status: None   Collection Time: 06/30/2021  5:55 PM   Specimen: BLOOD  Result Value Ref Range Status   Specimen Description BLOOD RIGHT FOREARM  Final   Special Requests   Final     BOTTLES DRAWN AEROBIC AND ANAEROBIC Blood Culture results may not be optimal due to an inadequate volume of blood received in culture bottles   Culture   Final    NO GROWTH 5 DAYS Performed at [facility], [address], [place], [place] [address]    Report Status 07/24/2021 FINAL  Final  Blood Culture (routine x 2)     Status: None   Collection Time: 07/05/2021  5:58 PM   Specimen: BLOOD  Result Value Ref Range Status   Specimen Description BLOOD RIGHT FOREARM  Final   Special Requests   Final    BOTTLES DRAWN AEROBIC AND ANAEROBIC Blood Culture adequate volume   Culture   Final    NO GROWTH 5 DAYS Performed at [facility], [address] [place], [place] [address]    Report Status 07/24/2021 FINAL  Final  SARS Coronavirus 2 by RT PCR (hospital order, performed in [facility] hospital lab) *cepheid single result test* Anterior Nasal Swab     Status: None   Collection Time: 07/21/2021  6:57 PM   Specimen: Anterior Nasal Swab  Result Value Ref Range Status   SARS Coronavirus 2 by RT PCR NEGATIVE NEGATIVE Final    Comment: (NOTE) SARS-CoV-2 target nucleic acids are NOT DETECTED.  The SARS-CoV-2 RNA is generally detectable in upper and lower respiratory specimens during the acute phase of infection. The lowest concentration of SARS-CoV-2 viral copies this assay can detect is 250 copies / mL. A negative result does not preclude SARS-CoV-2 infection and should not be used as the sole basis for treatment or other patient management decisions.  A negative result may occur with improper specimen collection / handling, submission of specimen other than nasopharyngeal swab, presence of viral mutation(s) within the areas targeted by this assay, and inadequate number of viral copies (<250 copies / mL). A negative result must be combined with clinical observations, patient history, and epidemiological information.  Fact Sheet for Patients:    https://www.patel.info/  Fact Sheet for Healthcare Providers: https://hall.com/  This test is not yet approved or  cleared by the [place] FDA and has been authorized for detection and/or diagnosis of SARS-CoV-2 by FDA under an Emergency Use Authorization (EUA).  This EUA will remain in effect (meaning this test can be used) for the duration of the COVID-19 declaration under Section 564(b)(1) of the Act, 21 U.S.C. section 360bbb-3(b)(1), unless the authorization is terminated or revoked sooner.  Performed at [facility], [address] [place], [place] [address]   MRSA Next Gen by PCR, Nasal     Status: Abnormal   Collection Time: 06/28/2021 11:33 PM   Specimen: Nasal Swab  Result Value  Ref Range Status   MRSA by PCR Next Gen DETECTED (A) NOT DETECTED Final    Comment: RESULT CALLED TO, READ BACK BY AND VERIFIED WITH: ANTACIA BELL @0124  ON5/26/23 SKL (NOTE) The GeneXpert MRSA Assay (FDA approved for NASAL specimens only), is one component of a comprehensive MRSA colonization surveillance program. It is not intended to diagnose MRSA infection nor to guide or monitor treatment for MRSA infections. Test performance is not FDA approved in patients less than 35 years old. Performed at [facility], [address] [place], [place] [address]   Culture, Respiratory w Gram Stain     Status: None   Collection Time: 07/20/21  6:19 AM   Specimen: Tracheal Aspirate; Respiratory  Result Value Ref Range Status   Specimen Description   Final    TRACHEAL ASPIRATE Performed at [facility], [address], [place], [place] [address]    Special Requests   Final    NONE Performed at [facility], [address] [place], [place] [address]    Gram Stain   Final    FEW WBC PRESENT, PREDOMINANTLY PMN NO ORGANISMS SEEN    Culture   Final    RARE Normal respiratory flora-no Staph aureus or Pseudomonas  seen Performed at [facility] [address], [place], [place] [address]    Report Status 07/22/2021 FINAL  Final  Respiratory (~20 pathogens) panel by PCR     Status: None   Collection Time: 07/23/21 11:33 AM   Specimen: Nasopharyngeal Swab; Respiratory  Result Value Ref Range Status   Adenovirus NOT DETECTED NOT DETECTED Final   Coronavirus 229E NOT DETECTED NOT DETECTED Final    Comment: (NOTE) The Coronavirus on the Respiratory Panel, DOES NOT test for the novel  Coronavirus (2019 nCoV)    Coronavirus HKU1 NOT DETECTED NOT DETECTED Final   Coronavirus NL63 NOT DETECTED NOT DETECTED Final   Coronavirus OC43 NOT DETECTED NOT DETECTED Final   Metapneumovirus NOT DETECTED NOT DETECTED Final   Rhinovirus / Enterovirus NOT DETECTED NOT DETECTED Final   Influenza A NOT DETECTED NOT DETECTED Final   Influenza B NOT DETECTED NOT DETECTED Final   Parainfluenza Virus 1 NOT DETECTED NOT DETECTED Final   Parainfluenza Virus 2 NOT DETECTED NOT DETECTED Final   Parainfluenza Virus 3 NOT DETECTED NOT DETECTED Final   Parainfluenza Virus 4 NOT DETECTED NOT DETECTED Final   Respiratory Syncytial Virus NOT DETECTED NOT DETECTED Final   Bordetella pertussis NOT DETECTED NOT DETECTED Final   Bordetella Parapertussis NOT DETECTED NOT DETECTED Final   Chlamydophila pneumoniae NOT DETECTED NOT DETECTED Final   Mycoplasma pneumoniae NOT DETECTED NOT DETECTED Final    Comment: Performed at [facility], [place]. [address], [place], [place] [address]    Time spent: 25 minutes  Signed: Annita Brod, MD 2021/08/05

## 2021-08-25 NOTE — Progress Notes (Incomplete)
0735 Patient family updated at bedside.   0744 Patient time of death 32, wife at bedside.   [id number] Family at bedside.   0900 Belongings returned to family.

## 2021-08-25 NOTE — Progress Notes (Addendum)
   August 22, 2021 0842  Clinical Encounter Type  Visited With Other (Comment) (chaplain)  Visit Type Death  Referral From Chaplain  Consult/Referral To Chaplain   Chaplain Burris attended ICU in support of family; hand-off from [employer]. Luna Fuse will allow family time to remain at bedside with this chaplain standing by for support as needed.  Went in to see family (at 8:50) and offered a prayer at family's request. Chaplain B offered hospitality, family expressed no further needs at this time. This chaplain left them to be at the bedside to process their grief together.  Will f/u within the hour.

## 2021-08-25 NOTE — Progress Notes (Signed)
Pt TOD 0744... wife at bedside with chaplain, pronounced by Whitman Hero, and Skeet Latch, RN's

## 2021-08-25 NOTE — Assessment & Plan Note (Signed)
With AKI secondary to ATN from sepsis.

## 2021-08-25 NOTE — Assessment & Plan Note (Signed)
Pressure Injury 07/20/21 Coccyx Medial Stage 1 -  Intact skin with non-blanchable redness of a localized area usually over a bony prominence. non blanchable (Active)  07/20/21 0659  Location: Coccyx  Location Orientation: Medial  Staging: Stage 1 -  Intact skin with non-blanchable redness of a localized area usually over a bony prominence.  Wound Description (Comments): non blanchable  Present on Admission: Yes     Pressure Injury 07/20/21 Tibial Posterior;Proximal;Right Deep Tissue Pressure Injury - Purple or maroon localized area of discolored intact skin or blood-filled blister due to damage of underlying soft tissue from pressure and/or shear. (Active)  07/20/21 0500  Location: Tibial  Location Orientation: Posterior;Proximal;Right  Staging: Deep Tissue Pressure Injury - Purple or maroon localized area of discolored intact skin or blood-filled blister due to damage of underlying soft tissue from pressure and/or shear.  Wound Description (Comments):   Present on Admission: Yes   Stage I coccyx pressure ulceration, present on admission

## 2021-08-25 NOTE — Hospital Course (Signed)
Patient is a 76 year old male with past medical history of CAD with recent hospitalization for sepsis suspected to be secondary to pneumonia.  That hospital course was complicated by non-STEMI and heart catheterization noted severe two-vessel CAD with complex 95% calcified stenosis proximal LAD, 75% stenosis of the ostial left circumflex and 60% stenosis of the left main and patient was transferred to [facility] on 5/1 for a CABG.  From there, patient was discharged to a skilled nursing facility.    He presented to the emergency room here at [facility] on 5/25 with cough and shortness of breath and admitted for sepsis from pneumonia.  Following admission, patient went into rapid atrial fibrillation and respiratory status further declined and patient was intubated and placed on ventilator.  CT scan of chest noted concerns for interstitial lung disease.  Patient found to have elevated BNP with echocardiogram noting preserved ejection fraction, but right ventricular overload.  Also found to have acute kidney injury which continued to worsen and nephrology consulted.  Kidney injury felt to be secondary to ATN from hypotension caused by hemodynamic instability.  Patient felt to have very poor prognosis or chance for meaningful recovery.  After discussions with critical care and palliative care, decision made to extubate patient 5/30 and see if he would recover.  Decision made not to reintubate even if patient declined.  Patient extubated and then placed on BiPAP requiring Precedex drip for agitation.  Patient's respiratory status worsened and decision made on 5/31 to make patient comfortable.  Patient passed away on 08-04-22 at 7:44 AM.

## 2021-08-25 DEATH — deceased
